# Patient Record
Sex: Male | Born: 1950 | Race: White | Hispanic: No | Marital: Married | Smoking: Former smoker
Health system: Southern US, Community
[De-identification: ages and names within clinical notes are randomized; demographics above are authoritative.]

## PROBLEM LIST (undated history)

## (undated) DIAGNOSIS — M1990 Unspecified osteoarthritis, unspecified site: Secondary | ICD-10-CM

## (undated) DIAGNOSIS — I6529 Occlusion and stenosis of unspecified carotid artery: Secondary | ICD-10-CM

## (undated) DIAGNOSIS — I10 Essential (primary) hypertension: Secondary | ICD-10-CM

## (undated) DIAGNOSIS — K219 Gastro-esophageal reflux disease without esophagitis: Secondary | ICD-10-CM

## (undated) DIAGNOSIS — E785 Hyperlipidemia, unspecified: Secondary | ICD-10-CM

## (undated) DIAGNOSIS — R011 Cardiac murmur, unspecified: Secondary | ICD-10-CM

## (undated) DIAGNOSIS — J45909 Unspecified asthma, uncomplicated: Secondary | ICD-10-CM

## (undated) DIAGNOSIS — T7840XA Allergy, unspecified, initial encounter: Secondary | ICD-10-CM

## (undated) DIAGNOSIS — D369 Benign neoplasm, unspecified site: Secondary | ICD-10-CM

## (undated) HISTORY — DX: Hyperlipidemia, unspecified: E78.5

## (undated) HISTORY — PX: COLONOSCOPY: SHX174

## (undated) HISTORY — DX: Allergy, unspecified, initial encounter: T78.40XA

## (undated) HISTORY — DX: Cardiac murmur, unspecified: R01.1

## (undated) HISTORY — DX: Unspecified osteoarthritis, unspecified site: M19.90

## (undated) HISTORY — DX: Occlusion and stenosis of unspecified carotid artery: I65.29

## (undated) HISTORY — DX: Gastro-esophageal reflux disease without esophagitis: K21.9

## (undated) HISTORY — DX: Essential (primary) hypertension: I10

## (undated) HISTORY — PX: WISDOM TOOTH EXTRACTION: SHX21

## (undated) HISTORY — DX: Benign neoplasm, unspecified site: D36.9

## (undated) HISTORY — PX: POLYPECTOMY: SHX149

## (undated) HISTORY — DX: Unspecified asthma, uncomplicated: J45.909

---

## 1997-12-02 ENCOUNTER — Other Ambulatory Visit: Admission: RE | Admit: 1997-12-02 | Discharge: 1997-12-02 | Payer: Self-pay | Admitting: Gastroenterology

## 1999-07-29 ENCOUNTER — Emergency Department (HOSPITAL_COMMUNITY): Admission: EM | Admit: 1999-07-29 | Discharge: 1999-07-29 | Payer: Self-pay

## 1999-08-07 ENCOUNTER — Encounter: Payer: Self-pay | Admitting: *Deleted

## 1999-08-07 ENCOUNTER — Emergency Department (HOSPITAL_COMMUNITY): Admission: EM | Admit: 1999-08-07 | Discharge: 1999-08-07 | Payer: Self-pay | Admitting: Emergency Medicine

## 1999-08-09 ENCOUNTER — Inpatient Hospital Stay (HOSPITAL_COMMUNITY): Admission: AD | Admit: 1999-08-09 | Discharge: 1999-08-12 | Payer: Self-pay | Admitting: *Deleted

## 2006-01-16 ENCOUNTER — Ambulatory Visit: Payer: Self-pay | Admitting: Family Medicine

## 2006-01-16 LAB — CONVERTED CEMR LAB
BUN: 17 mg/dL (ref 6–23)
CO2: 31 meq/L (ref 19–32)
Calcium: 9.5 mg/dL (ref 8.4–10.5)
Chloride: 98 meq/L (ref 96–112)
Creatinine, Ser: 1 mg/dL (ref 0.4–1.5)
GFR calc non Af Amer: 82 mL/min
Glomerular Filtration Rate, Af Am: 99 mL/min/{1.73_m2}
Glucose, Bld: 113 mg/dL — ABNORMAL HIGH (ref 70–99)
Potassium: 3.8 meq/L (ref 3.5–5.1)
Sodium: 137 meq/L (ref 135–145)

## 2006-06-28 ENCOUNTER — Ambulatory Visit: Payer: Self-pay | Admitting: Family Medicine

## 2006-06-28 LAB — CONVERTED CEMR LAB
BUN: 17 mg/dL (ref 6–23)
CO2: 31 meq/L (ref 19–32)
Calcium: 9.2 mg/dL (ref 8.4–10.5)
Chloride: 106 meq/L (ref 96–112)
Creatinine, Ser: 0.8 mg/dL (ref 0.4–1.5)
GFR calc Af Amer: 129 mL/min
GFR calc non Af Amer: 106 mL/min
Glucose, Bld: 102 mg/dL — ABNORMAL HIGH (ref 70–99)
Potassium: 3.9 meq/L (ref 3.5–5.1)
Sodium: 141 meq/L (ref 135–145)

## 2006-09-25 ENCOUNTER — Telehealth (INDEPENDENT_AMBULATORY_CARE_PROVIDER_SITE_OTHER): Payer: Self-pay | Admitting: *Deleted

## 2006-09-26 ENCOUNTER — Ambulatory Visit: Payer: Self-pay | Admitting: Family Medicine

## 2006-09-26 DIAGNOSIS — I10 Essential (primary) hypertension: Secondary | ICD-10-CM | POA: Insufficient documentation

## 2007-04-10 ENCOUNTER — Telehealth (INDEPENDENT_AMBULATORY_CARE_PROVIDER_SITE_OTHER): Payer: Self-pay | Admitting: *Deleted

## 2007-04-17 ENCOUNTER — Ambulatory Visit: Payer: Self-pay | Admitting: Family Medicine

## 2007-04-18 ENCOUNTER — Telehealth (INDEPENDENT_AMBULATORY_CARE_PROVIDER_SITE_OTHER): Payer: Self-pay | Admitting: *Deleted

## 2007-06-04 ENCOUNTER — Ambulatory Visit: Payer: Self-pay | Admitting: Internal Medicine

## 2007-06-04 DIAGNOSIS — J45909 Unspecified asthma, uncomplicated: Secondary | ICD-10-CM | POA: Insufficient documentation

## 2007-06-04 DIAGNOSIS — K219 Gastro-esophageal reflux disease without esophagitis: Secondary | ICD-10-CM | POA: Insufficient documentation

## 2007-11-26 ENCOUNTER — Ambulatory Visit: Payer: Self-pay | Admitting: Internal Medicine

## 2008-01-20 ENCOUNTER — Telehealth (INDEPENDENT_AMBULATORY_CARE_PROVIDER_SITE_OTHER): Payer: Self-pay | Admitting: *Deleted

## 2008-10-04 ENCOUNTER — Telehealth (INDEPENDENT_AMBULATORY_CARE_PROVIDER_SITE_OTHER): Payer: Self-pay | Admitting: *Deleted

## 2008-11-11 ENCOUNTER — Ambulatory Visit: Payer: Self-pay | Admitting: Family Medicine

## 2009-01-07 ENCOUNTER — Ambulatory Visit: Payer: Self-pay | Admitting: Internal Medicine

## 2009-01-07 DIAGNOSIS — R739 Hyperglycemia, unspecified: Secondary | ICD-10-CM | POA: Insufficient documentation

## 2009-01-07 DIAGNOSIS — E785 Hyperlipidemia, unspecified: Secondary | ICD-10-CM | POA: Insufficient documentation

## 2009-03-21 ENCOUNTER — Ambulatory Visit (HOSPITAL_BASED_OUTPATIENT_CLINIC_OR_DEPARTMENT_OTHER): Admission: RE | Admit: 2009-03-21 | Discharge: 2009-03-21 | Payer: Self-pay | Admitting: Internal Medicine

## 2009-03-21 ENCOUNTER — Ambulatory Visit: Payer: Self-pay | Admitting: Family

## 2009-03-21 ENCOUNTER — Ambulatory Visit: Payer: Self-pay | Admitting: Diagnostic Radiology

## 2009-04-05 ENCOUNTER — Telehealth (INDEPENDENT_AMBULATORY_CARE_PROVIDER_SITE_OTHER): Payer: Self-pay | Admitting: *Deleted

## 2009-10-26 ENCOUNTER — Telehealth (INDEPENDENT_AMBULATORY_CARE_PROVIDER_SITE_OTHER): Payer: Self-pay | Admitting: *Deleted

## 2010-01-04 ENCOUNTER — Telehealth (INDEPENDENT_AMBULATORY_CARE_PROVIDER_SITE_OTHER): Payer: Self-pay | Admitting: *Deleted

## 2010-02-23 ENCOUNTER — Ambulatory Visit: Payer: Self-pay | Admitting: Internal Medicine

## 2010-04-04 NOTE — Assessment & Plan Note (Signed)
Summary: REFILL ON BP MED/CDJ   Vital Signs:  Patient Profile:   59 Years Old Male Weight:      223.13 pounds Pulse rate:   64 / minute Resp:     16 per minute BP sitting:   110 / 74  Vitals Entered By: Kandice Hams (November 26, 2007 3:29 PM)                 Chief Complaint:  folloup bp med refill.  History of Present Illness: No BP pill X 24 hrs.Occa postural hypotension symptoms.Micardis HCT costs $80/month. Previously on Altace w/o issues.No health insurance ,therefore no colonoscopy  Hypertension History:      He denies headache, chest pain, palpitations, dyspnea with exertion, peripheral edema, visual symptoms, neurologic problems, syncope, and side effects from treatment.  Further comments include: BP not monitored @ home .        Positive major cardiovascular risk factors include male age 55 years old or older and hypertension.       Current Allergies: No known allergies   Past Medical History:    Hypertension    Asthma    GERD  Past Surgical History:    Denies surgical history   Family History:    Father: MI @ 62    Mother: arthritis; pulmonary nodules; lupus    Siblings: sister HTN    Review of Systems  Eyes      Denies blurring, double vision, and vision loss-both eyes.  CV      Denies bluish discoloration of lips or nails and leg cramps with exertion.  GI      Denies indigestion.  Neuro      Denies numbness and tingling.   Physical Exam  General:     well-nourished,in no acute distress; alert,appropriate and cooperative throughout examination Lungs:     Normal respiratory effort, chest expands symmetrically. Lungs are clear to auscultation, no crackles or wheezes. Heart:     Normal rate and regular rhythm. S1 and S2 normal without gallop, murmur, click, rub or other extra sounds. Abdomen:     Bowel sounds positive,abdomen soft and non-tender without masses, organomegaly or hernias noted.No AAA, no bruits Pulses:     R and L  carotid,radial,dorsalis pedis and posterior tibial pulses are full and equal bilaterally Extremities:     trace left pedal edema and trace right pedal edema.      Impression & Recommendations:  Problem # 1:  HYPERTENSION (ICD-401.9)  The following medications were removed from the medication list:    Micardis Hct 80-12.5 Mg Tabs (Telmisartan-hctz) .Marland Kitchen... Take one tablet daily  His updated medication list for this problem includes:    Lisinopril 20 Mg Tabs (Lisinopril) .Marland Kitchen... 1 qd    Hydrochlorothiazide 12.5 Mg Caps (Hydrochlorothiazide) .Marland Kitchen... 1 qd   Complete Medication List: 1)  Zantac 150 Maximum Strength 150 Mg Tabs (Ranitidine hcl) .Marland Kitchen.. 1 q 12 hrs 2)  Lisinopril 20 Mg Tabs (Lisinopril) .Marland Kitchen.. 1 qd 3)  Hydrochlorothiazide 12.5 Mg Caps (Hydrochlorothiazide) .Marland Kitchen.. 1 qd  Other Orders: Admin 1st Vaccine (16109) Flu Vaccine 55yrs + (60454)  Hypertension Assessment/Plan:      The patient's hypertensive risk group is category B: At least one risk factor (excluding diabetes) with no target organ damage.  Today's blood pressure is 110/74.     Patient Instructions: 1)  Check your Blood Pressure regularly. If it is above:130/85 on average  you should make an appointment.Consider coverage for colonoscopy.   Prescriptions: HYDROCHLOROTHIAZIDE 12.5 MG  CAPS (HYDROCHLOROTHIAZIDE) 1 qd  #90 x 5   Entered and Authorized by:   Marga Melnick MD   Signed by:   Marga Melnick MD on 11/26/2007   Method used:   Print then Give to Patient   RxID:   [id number] LISINOPRIL 20 MG TABS (LISINOPRIL) 1 qd  #90 x 5   Entered and Authorized by:   Marga Melnick MD   Signed by:   Marga Melnick MD on 11/26/2007   Method used:   Print then Give to Patient   RxID:   [id number]  ]  Flu Vaccine Consent Questions     Do you have a history of severe allergic reactions to this vaccine? no    Any prior history of allergic reactions to egg and/or gelatin? no    Do you have a sensitivity to the  preservative Thimersol? no    Do you have a past history of Guillan-Barre Syndrome? no    Do you currently have an acute febrile illness? no    Have you ever had a severe reaction to latex? no    Vaccine information given and explained to patient? yes    Are you currently pregnant? no    Lot Number:[id number]   Site Given  right Deltoid IM

## 2010-04-04 NOTE — Assessment & Plan Note (Signed)
Summary: SORE THROAT/DRAINAGE/COUGHING/WHEEZING/KDC   Vital Signs:  Patient profile:   60 year old male Weight:      222 pounds Temp:     98.3 degrees F oral Pulse rate:   72 / minute BP sitting:   130 / 82  (left arm)  Vitals Entered By: Jeremy Johann CMA (November 11, 2008 3:49 PM) CC: sore throat, cough, drainage x1week, URI symptoms   History of Present Illness:       This is a 60 year old man who presents with URI symptoms.  The symptoms began 1 week ago.  The patient complains of nasal congestion, purulent nasal discharge, sore throat, and productive cough.  Associated symptoms include wheezing.  The patient denies fever, low-grade fever (<100.5 degrees), fever of 100.5-103 degrees, fever of 103.1-104 degrees, fever to >104 degrees, stiff neck, dyspnea, rash, vomiting, diarrhea, use of an antipyretic, and response to antipyretic.  The patient also reports headache.  The patient denies itchy watery eyes, itchy throat, sneezing, seasonal symptoms, response to antihistamine, muscle aches, and severe fatigue.  The patient denies the following risk factors for Strep sinusitis: unilateral facial pain, unilateral nasal discharge, poor response to decongestant, double sickening, tooth pain, Strep exposure, tender adenopathy, and absence of cough.    Current Medications (verified): 1)  Zantac 150 Maximum Strength 150 Mg  Tabs (Ranitidine Hcl) .Marland Kitchen.. 1 Q 12 Hrs 2)  Lisinopril 20 Mg Tabs (Lisinopril) .Marland Kitchen.. 1 Qd 3)  Hydrochlorothiazide 12.5 Mg Caps (Hydrochlorothiazide) .Marland Kitchen.. 1 Qd 4)  Augmentin 875-125 Mg Tabs (Amoxicillin-Pot Clavulanate) .Marland Kitchen.. 1 By Mouth Two Times A Day  Allergies (verified): No Known Drug Allergies  Past History:  Past medical, surgical, family and social histories (including risk factors) reviewed, and no changes noted (except as noted below).  Past Medical History: Reviewed history from 11/26/2007 and no changes required. Hypertension Asthma GERD  Past Surgical  History: Reviewed history from 11/26/2007 and no changes required. Denies surgical history  Family History: Reviewed history from 11/26/2007 and no changes required. Father: MI @ 31 Mother: arthritis; pulmonary nodules; lupus Siblings: sister HTN  Social History: Reviewed history and no changes required.  Review of Systems      See HPI  Physical Exam  General:  Well-developed,well-nourished,in no acute distress; alert,appropriate and cooperative throughout examination Ears:  External ear exam shows no significant lesions or deformities.  Otoscopic examination reveals clear canals, tympanic membranes are intact bilaterally without bulging, retraction, inflammation or discharge. Hearing is grossly normal bilaterally. Nose:  L frontal sinus tenderness, L maxillary sinus tenderness, R frontal sinus tenderness, and R maxillary sinus tenderness.   Mouth:  Oral mucosa and oropharynx without lesions or exudates.  Teeth in good repair. Neck:  No deformities, masses, or tenderness noted. Lungs:  Normal respiratory effort, chest expands symmetrically. Lungs are clear to auscultation, no crackles or wheezes. Heart:  Normal rate and regular rhythm. S1 and S2 normal without gallop, murmur, click, rub or other extra sounds. Msk:  No deformity or scoliosis noted of thoracic or lumbar spine.   Skin:  Intact without suspicious lesions or rashes Cervical Nodes:  No lymphadenopathy noted Psych:  Cognition and judgment appear intact. Alert and cooperative with normal attention span and concentration. No apparent delusions, illusions, hallucinations   Impression & Recommendations:  Problem # 1:  SINUSITIS - ACUTE-NOS (ICD-461.9)  His updated medication list for this problem includes:    Augmentin 875-125 Mg Tabs (Amoxicillin-pot clavulanate) .Marland Kitchen... 1 by mouth two times a day  Nasonex 50 Mcg/act Susp (Mometasone furoate) .Marland Kitchen... 2 sprays each nostril once daily    Astepro 0.15 % Soln (Azelastine hcl)  .Marland Kitchen... 2 sprays each nostril once daily  Instructed on treatment. Call if symptoms persist or worsen.   Orders: Nebulizer Tx (60454) Rapid Strep (09811)  Problem # 2:  BRONCHITIS- ACUTE (ICD-466.0)  His updated medication list for this problem includes:    Augmentin 875-125 Mg Tabs (Amoxicillin-pot clavulanate) .Marland Kitchen... 1 by mouth two times a day  Take antibiotics and other medications as directed. Encouraged to push clear liquids, get enough rest, and take acetaminophen as needed. To be seen in 5-7 days if no improvement, sooner if worse.  Orders: Nebulizer Tx (91478) Rapid Strep (29562)  Complete Medication List: 1)  Zantac 150 Maximum Strength 150 Mg Tabs (Ranitidine hcl) .Marland Kitchen.. 1 q 12 hrs 2)  Lisinopril 20 Mg Tabs (Lisinopril) .Marland Kitchen.. 1 qd 3)  Hydrochlorothiazide 12.5 Mg Caps (Hydrochlorothiazide) .Marland Kitchen.. 1 qd 4)  Augmentin 875-125 Mg Tabs (Amoxicillin-pot clavulanate) .Marland Kitchen.. 1 by mouth two times a day 5)  Nasonex 50 Mcg/act Susp (Mometasone furoate) .... 2 sprays each nostril once daily 6)  Astepro 0.15 % Soln (Azelastine hcl) .... 2 sprays each nostril once daily Prescriptions: AUGMENTIN 875-125 MG TABS (AMOXICILLIN-POT CLAVULANATE) 1 by mouth two times a day  #20 x 0   Entered and Authorized by:   Loreen Freud DO   Signed by:   Loreen Freud DO on 11/11/2008   Method used:   Electronically to        [facility] [phone]* (retail)       [address]       [place], [place]  [address]       Ph: [phone]       Fax: [phone]   RxID:   [id number]    EKG  Procedure date:  11/11/2008  Findings:      NSR 87 bpm    Medication Administration  Medication # 1:    Medication: Xopenex 1.25mg     Diagnosis: SINUSITIS - ACUTE-NOS (ICD-461.9)    Route: inhaled    Exp Date: 02/02/2009    Lot #: [id number]    Mfr: sepracor    Patient tolerated medication without complications    Given by: Jeremy Johann CMA (November 11, 2008 4:39 PM)  Orders Added: 1)  Est. Patient Level  IV [87564] 2)  Nebulizer Tx [94640] 3)  Rapid Strep [33295]

## 2010-04-04 NOTE — Progress Notes (Signed)
Summary: cough - dr Blossom Hoops   Phone Note Call from Patient Call back at [phone]   Caller: Patient Summary of Call: patient congested,cough causing tightness in chest, temp occasionally x's 3 weeks --- has appt [phone] wants appt sooner    Initial call taken by: Okey Regal [place],  September 25, 2006 2:21 PM  Follow-up for Phone Call        spoke with pt sched ov tomorrow 7/24 9:45 Follow-up by: Kandice Hams,  September 25, 2006 2:46 PM

## 2010-04-04 NOTE — Progress Notes (Signed)
Summary: ? If Pre Meds needed  Phone Note From Other Clinic Call back at [phone]   Caller: Dr.Hartzell's office-[place] Summary of Call: Patient was at his dentist office now and they would like to confirm what patient told them that he does NOT need pre-meds before a teeth cleaning. Patient at one point did but said the last few times he did NOT need pre meds.   Dr.Hopper reviewed patient's chart and agreed that no pre meds required based on last visit with him, no murmur noted./Chrae [facility] CMA  October 26, 2009 3:38 PM

## 2010-04-04 NOTE — Progress Notes (Signed)
Summary: REFILL  Phone Note Refill Request Message from:  Pharmacy on [facility] FAX [phone]  Refills Requested: Medication #1:  ZANTAC 150 MAXIMUM STRENGTH 150 MG  TABS 1 q 12 hrs Initial call taken by: Barb Merino,  October 04, 2008 3:07 PM    Prescriptions: ZANTAC 150 MAXIMUM STRENGTH 150 MG  TABS (RANITIDINE HCL) 1 q 12 hrs  #180 x 1   Entered by:   Kandice Hams   Authorized by:   Marga Melnick MD   Signed by:   Kandice Hams on 10/05/2008   Method used:   Faxed to ...       [facility]* (retail)       [address]       [place], [place]  [address]       Ph: [phone]       Fax: [phone]   RxID:   [id number]

## 2010-04-04 NOTE — Assessment & Plan Note (Signed)
Summary: congested cough/cbs    Vital Signs:  Patient Profile:   60 Years Old Male Weight:      223.25 pounds O2 Sat:      98 % Pulse rate:   62 / minute BP sitting:   130 / 90  Vitals Entered By: Kandice Hams (April 17, 2007 12:39 PM)                 Chief Complaint:  c/o cough non productive, chest congestion, and URI symptoms.  History of Present Illness:  URI Symptoms      This is a 60 year old man who presents with URI symptoms.  The symptoms began duration > 3 weeks ago.  Started with sore throat, PND,  and nasal congestion 3 weeks. Last week started with a productive cough that responded to Mucinex. Then cough restarted over the weekend.    Reports some wheezing and  SOB when he gets into a coughing spell.  The patient reports nasal congestion and dry cough, but denies  purulent nasal discharge, earache.  The patient denies fever, chest pain.      Past Medical History:    Reviewed history from 09/26/2006 and no changes required:       Hypertension      Physical Exam  General:     Well-developed,well-nourished,in no acute distress; alert,appropriate and cooperative throughout examination Ears:     External ear exam shows no significant lesions or deformities.  Otoscopic examination reveals clear canals, tympanic membranes are intact bilaterally without bulging, retraction, inflammation or discharge. Hearing is grossly normal bilaterally. Nose:     nasal dischargemucosal pallor.   Mouth:     o/p mildly erythematous Neck:     No deformities, masses, or tenderness noted. Lungs:     Normal respiratory effort, chest expands symmetrically. Lungs are clear to auscultation, no crackles or wheezes. Heart:     Normal rate and regular rhythm. S1 and S2 normal without gallop, murmur, click, rub or other extra sounds. Additional Exam:     EKG: NSr with no ST elevation or depression. No Q waves.  PVC.    Impression & Recommendations:  Problem # 1:   BRONCHITIS-ACUTE (ICD-466.0)  His updated medication list for this problem includes:    Zithromax Z-pak 250 Mg Tabs (Azithromycin) .Marland Kitchen... As directed  Take antibiotics and other medications as directed. Encouraged to push clear liquids, get enough rest, and take acetaminophen as needed. To be seen in 5-7 days if no improvement, sooner if worse.  Orders: T-2 View CXR, Same Day (71020.5TC)   Complete Medication List: 1)  Micardis Hct 80-12.5 Mg Tabs (Telmisartan-hctz) .... Take one tablet daily 2)  Co Q-10 Vitamin E Fish Oil 60-90-25-200 Caps (Dha-epa-coenzyme q10-vitamin e) 3)  Baby Aspirin 81 Mg Chew (Aspirin) 4)  Zithromax Z-pak 250 Mg Tabs (Azithromycin) .... As directed  Other Orders: EKG w/ Interpretation (93000)     Prescriptions: ZITHROMAX Z-PAK 250 MG  TABS (AZITHROMYCIN) as directed  #1 x 0   Entered and Authorized by:   Leanne Chang MD   Signed by:   Leanne Chang MD on 04/17/2007   Method used:   Print then Give to Patient   RxID:   [id number]  ]

## 2010-04-04 NOTE — Assessment & Plan Note (Signed)
Summary: congestion,cough/alr  Medications Added MICARDIS HCT 80-12.5 MG  TABS (TELMISARTAN-HCTZ) take one tablet daily CO Q-10 VITAMIN E FISH OIL 60-90-25-200  CAPS (DHA-EPA-COENZYME Q10-VITAMIN E)  BABY ASPIRIN 81 MG  CHEW (ASPIRIN)  ZITHROMAX Z-PAK 250 MG  TABS (AZITHROMYCIN) as directed        Vital Signs:  Patient Profile:   60 Years Old Male Weight:      221 pounds Temp:     98.0 degrees F oral Pulse rate:   72 / minute Resp:     16 per minute BP sitting:   120 / 80  (right arm)  Pt. in pain?   no  Vitals Entered By: Ardyth Man (September 26, 2006 9:47 AM)                Chief Complaint:  BP check and runny nose, congestion (chest) color yellow., and URI symptoms.  History of Present Illness:  URI Symptoms      This is a 60 year old man who presents with URI symptoms.  The symptoms began duration 2 weeks ago.  The patient reports nasal congestion, sore throat, and productive cough.  Risk factors for Strep sinusitis include tooth pain.  Had a fever for 2 days. Son had similar symptoms.  Mild SOB ,especially with cough. Denies chest pain.    Past Medical History:    Hypertension      Physical Exam  General:     Well-developed,well-nourished,in no acute distress; alert,appropriate and cooperative throughout examination Ears:     External ear exam shows no significant lesions or deformities.  Otoscopic examination reveals clear canals, tympanic membranes are intact bilaterally without bulging, retraction, inflammation or discharge. Hearing is grossly normal bilaterally. Nose:     Swollen turbinates with yellow nasal discharge Mouth:     Oral mucosa and oropharynx without lesions or exudates.  Teeth in good repair. Neck:     No deformities, masses, or tenderness noted. Lungs:     Normal respiratory effort, chest expands symmetrically. Lungs are clear to auscultation, no crackles or wheezes. Heart:     Normal rate and regular rhythm. S1 and S2 normal  without gallop, murmur, click, rub or other extra sounds.    Impression & Recommendations:  Problem # 1:  BRONCHITIS-ACUTE (ICD-466.0)  His updated medication list for this problem includes:    Zithromax Z-pak 250 Mg Tabs (Azithromycin) .Marland Kitchen... As directed  Orders: Radiology other (Radiology Other)  Take antibiotics and other medications as directed. Encouraged to push clear liquids, get enough rest, and take acetaminophen as needed. To be seen in 5-7 days if no improvement, sooner if worse. Samples of Nasonex 2 squirts each nostril daily for 10 days. F/u in 2 weeks   Problem # 2:  HYPERTENSION (ICD-401.9)  His updated medication list for this problem includes:    Micardis Hct 80-12.5 Mg Tabs (Telmisartan-hctz) .Marland Kitchen... Take one tablet daily  BP today: 120/80  Labs Reviewed: Creat: 0.8 (06/28/2006)  Orders: EKG w/ Interpretation (93000)   Medications Added to Medication List This Visit: 1)  Micardis Hct 80-12.5 Mg Tabs (Telmisartan-hctz) .... Take one tablet daily 2)  Co Q-10 Vitamin E Fish Oil 60-90-25-200 Caps (Dha-epa-coenzyme q10-vitamin e) 3)  Baby Aspirin 81 Mg Chew (Aspirin) 4)  Zithromax Z-pak 250 Mg Tabs (Azithromycin) .... As directed   Patient Instructions: 1)  Discussed with patient that for acute bronchitis symptoms of less than 10 days antibiotics are NOT indicated. Recommended acetaminophen [phone] mg every 4-6 hours (no more  than four times a day) and over the counter cough medication(Mucinex). Call if no improvement in 5-7 days, sooner if increasing cough,  or new symptoms.    Prescriptions: ZITHROMAX Z-PAK 250 MG  TABS (AZITHROMYCIN) as directed  #1 x 0   Entered and Authorized by:   Leanne Chang MD   Signed by:   Leanne Chang MD on 09/26/2006   Method used:   Print then Give to Patient   RxID:   [id number]

## 2010-04-04 NOTE — Assessment & Plan Note (Signed)
Summary: whezzing--acuteonly--tl   Vital Signs:  Patient Profile:   60 Years Old Male Weight:      227.8 pounds O2 Sat:      98 % O2 treatment:    Room Air Temp:     97.8 degrees F oral Pulse rate:   98 / minute Pulse rhythm:   regular Resp:     16 per minute BP sitting:   124 / 80  Pt. in pain?   no  Vitals Entered By: Shary Decamp (June 04, 2007 4:04 PM)              Comments patient had bronchitis 1 mo ago sxs never cleared +SOB tight cough wheezing PN drip using zyrtec ..................................................................Marland KitchenShary Decamp  June 04, 2007 4:06 PM      Chief Complaint:  wheezing and Cough.  History of Present Illness: RTI in 2/09; Rx: Zpack . CXray revealed ? bronchitis. No PMH asthma.  Cough; Rx: Zyrtec      This is a 60 year old man who presents with Cough.  The patient reports non-productive cough, wheezing, and exertional dyspnea, but denies productive cough, pleuritic chest pain, shortness of breath, fever, hemoptysis, and malaise.  Associated symtpoms include chronic rhinitis and acid reflux symptoms.  The patient denies the following symptoms: cold/URI symptoms, sore throat, nasal congestion, weight loss, and peripheral edema.  The cough is worse with exercise and activity.  Risk factors include recurrent sinus infections and chemical exposure.  Diagnostic testing to date has included CXR.  Trigger included dust, pet dander. Occa uses cleaning materials.    Updated Prior Medication List: MICARDIS HCT 80-12.5 MG  TABS (TELMISARTAN-HCTZ) take one tablet daily  Current Allergies (reviewed today): No known allergies       Physical Exam  General:     Well-developed,well-nourished,in no acute distress; alert,appropriate and cooperative throughout examination Eyes:     Funduscopic exam benign, without hemorrhages, exudates or papilledema. Vision grossly normal. Ears:     External ear exam shows no significant lesions or  deformities.  Otoscopic examination reveals clear canals, tympanic membranes are intact bilaterally without bulging, retraction, inflammation or discharge. Hearing is grossly normal bilaterally. Nose:     External nasal examination shows no deformity or inflammation. Nasal mucosa are pink and moist without lesions or exudates. Mouth:     Oral mucosa and oropharynx without lesions or exudates.  Teeth in good repair. Mild erythema. Lungs:     Normal respiratory effort, chest expands symmetrically. Lungs are clear to auscultation, no crackles or wheezes. Dry cough Skin:     Intact without suspicious lesions or rashes Damp to touch Cervical Nodes:     No lymphadenopathy noted Axillary Nodes:     No palpable lymphadenopathy    Impression & Recommendations:  Problem # 1:  REACTIVE AIRWAY DISEASE (ICD-493.90)  His updated medication list for this problem includes:    Symbicort 160-4.5 Mcg/act Aero (Budesonide-formoterol fumarate) .Marland Kitchen... 2 puffs q 12    Singulair 10 Mg Tabs (Montelukast sodium) .Marland Kitchen... 1 qd   Problem # 2:  GERD (ICD-530.81)  His updated medication list for this problem includes:    Zantac 150 Maximum Strength 150 Mg Tabs (Ranitidine hcl) .Marland Kitchen... 1 q 12 hrs   Complete Medication List: 1)  Micardis Hct 80-12.5 Mg Tabs (Telmisartan-hctz) .... Take one tablet daily 2)  Zantac 150 Maximum Strength 150 Mg Tabs (Ranitidine hcl) .Marland Kitchen.. 1 q 12 hrs 3)  Symbicort 160-4.5 Mcg/act Aero (Budesonide-formoterol fumarate) .... 2 puffs  q 12 4)  Singulair 10 Mg Tabs (Montelukast sodium) .Marland Kitchen.. 1 qd   Patient Instructions: 1)  Drink as much fluid as you can tolerate for the next few days. 2)  Avoid foods high in acid (tomatoes, citrus juices, spicy foods). Avoid eating within two hours of lying down or before exercising. Do not over eat; try smaller more frequent meals. Elevate head of bed twelve inches when sleeping.    Prescriptions: SINGULAIR 10 MG  TABS (MONTELUKAST SODIUM) 1 qd  #30 x  1   Entered and Authorized by:   Marga Melnick MD   Signed by:   Marga Melnick MD on 06/04/2007   Method used:   Print then Give to Patient   RxID:   [id number] SYMBICORT 160-4.5 MCG/ACT  AERO (BUDESONIDE-FORMOTEROL FUMARATE) 2 puffs q 12  #1 x 1   Entered and Authorized by:   Marga Melnick MD   Signed by:   Marga Melnick MD on 06/04/2007   Method used:   Print then Give to Patient   RxID:   [id number] ZANTAC 150 MAXIMUM STRENGTH 150 MG  TABS (RANITIDINE HCL) 1 q 12 hrs  #60 x 0   Entered and Authorized by:   Marga Melnick MD   Signed by:   Marga Melnick MD on 06/04/2007   Method used:   Print then Give to Patient   RxID:   [id number]  ]

## 2010-04-04 NOTE — Progress Notes (Signed)
Summary: still not better-lmom  Phone Note Call from Patient Call back at [phone]   Caller: Patient Summary of Call: pt was seen on 03-21-09 for sinus infection and was RX z-pak. pt finish z-pak about 10days ago. pt still c/o of dry coughing,wheezing, pain in teeth, bodyaches. pt uses [facility]. pls advise.................Marland KitchenFelecia Deloach CMA  April 05, 2009 12:38 PM    Follow-up for Phone Call        please call patient and let him know that he needs to return for visit. Follow-up by: Lemont Fillers FNP,  April 05, 2009 12:55 PM  Additional Follow-up for Phone Call Additional follow up Details #1::        left message on machine ..........Marland KitchenDoristine Devoid  April 05, 2009 1:33 PM

## 2010-04-04 NOTE — Progress Notes (Signed)
   Phone Note Outgoing Call Call back at [facility] Phone [phone]   Call placed by: Ardyth Man,  April 18, 2007 4:53 PM Call placed to: Patient Summary of Call: Spoke with patient's wife and is aware of cxray and Dr. Laqueta Linden recommendations. ...................................................................Ardyth Man  April 18, 2007 4:53 PM

## 2010-04-04 NOTE — Letter (Signed)
Summary: Handout Printed  Printed Handout:  - *[facility] Patient Instructions 

## 2010-04-04 NOTE — Progress Notes (Signed)
Summary: hop-refill  Phone Note Refill Request   Refills Requested: Medication #1:  ZANTAC 150 MAXIMUM STRENGTH 150 MG  TABS 1 q 12 hrs [facility] [address]--[phone]  Initial call taken by: Freddy Jaksch,  January 20, 2008 9:31 AM  Follow-up for Phone Call        #180,RX1 Follow-up by: Marga Melnick MD,  January 20, 2008 10:02 PM      Prescriptions: ZANTAC 150 MAXIMUM STRENGTH 150 MG  TABS (RANITIDINE HCL) 1 q 12 hrs  #180 x 1   Entered by:   Kandice Hams   Authorized by:   Marga Melnick MD   Signed by:   Kandice Hams on 01/21/2008   Method used:   Faxed to ...       [facility] (retail)       [address]       [place], [place]  [address]       Ph: [phone]       Fax: [phone]   RxID:   [id number]

## 2010-04-04 NOTE — Assessment & Plan Note (Signed)
Summary: sinus infection//fd   Vital Signs:  Patient profile:   60 year old male Weight:      224 pounds BMI:     28.48 O2 Sat:      97 % on Room air Temp:     97.9 degrees F oral Pulse rate:   72 / minute BP sitting:   110 / 70  (left arm)  Vitals Entered By: Doristine Devoid (March 21, 2009 4:07 PM)  O2 Flow:  Room air CC: sinus congestion and cough used OTC meds w/o improvement   Primary Care Provider:  Alfonse Flavors  CC:  sinus congestion and cough used OTC meds w/o improvement.  History of Present Illness: Charles Tate is a 60 year old male who presents today with c/o sinus drainage since Christmas.  Initially was having productive cough, now dry.  Has developed wheezing and feels a little light headed with standing.  Has been using nasonex with some improvement.    Allergies: No Known Drug Allergies  Review of Systems       Denies sinus tenderness or pressure.  Notes that he has had wheezing on and off- he wonderes if the lisinopril may be contributing to his wheezing and cough.    Physical Exam  General:  Well-developed,well-nourished,in no acute distress; alert,appropriate and cooperative throughout examination Head:  Normocephalic and atraumatic without obvious abnormalities. No apparent alopecia or balding. No sinus tenderness Eyes:  PERRLA Ears:  External ear exam shows no significant lesions or deformities.  Otoscopic examination reveals clear canals, tympanic membranes are intact bilaterally without bulging, retraction, inflammation or discharge. Hearing is grossly normal bilaterally. Mouth:  Oral mucosa and oropharynx without lesions or exudates.  Teeth in good repair. Neck:  No deformities, masses, or tenderness noted. Lungs:  Normal respiratory effort, chest expands symmetrically. Lungs are clear to auscultation, no crackles or wheezes. Heart:  Normal rate and regular rhythm. S1 and S2 normal without gallop, murmur, click, rub or other extra sounds.   Impression &  Recommendations:  Problem # 1:  SINUSITIS (ICD-473.9) Assessment New I suspect + sinusitus, patient instructed  to call if you develop fever over 101, increasing sinus pressure, pain with eye movement, increased facial tenderness of swelling, or if you develop visual changes.  The following medications were removed from the medication list:    Amoxicillin 500 Mg Cap (Amoxicillin) .Marland Kitchen... Take 1 capsule by mouth three times a day x 10 days His updated medication list for this problem includes:    Nasonex 50 Mcg/act Susp (Mometasone furoate) .Marland Kitchen... 2 sprays each nostril once daily    Astepro 0.15 % Soln (Azelastine hcl) .Marland Kitchen... 2 sprays each nostril once daily    Zithromax 250 Mg Tabs (Azithromycin) .Marland Kitchen..Marland Kitchen Two tablet by mouth x 1 tonight, then one tablet by mouth daily x 4 more days  Problem # 2:  BRONCHITIS (ICD-490) Called patient with CXR results- will plan to treat with zithromax instead of amoxicillin.  Patient aware of plan and will follow up with Dr. Alwyn Ren in 1 week.  I also suspect an element of RAD- will start dulera.   The following medications were removed from the medication list:    Amoxicillin 500 Mg Cap (Amoxicillin) .Marland Kitchen... Take 1 capsule by mouth three times a day x 10 days His updated medication list for this problem includes:    Dulera 100-5 Mcg/act Aero (Mometasone furo-formoterol fum) ..... One puff twice daily    Zithromax 250 Mg Tabs (Azithromycin) .Marland Kitchen..Marland Kitchen Two tablet by mouth x  1 tonight, then one tablet by mouth daily x 4 more days  Complete Medication List: 1)  Zantac 150 Maximum Strength 150 Mg Tabs (Ranitidine hcl) .Marland Kitchen.. 1 q 12 hrs 2)  Lisinopril 20 Mg Tabs (Lisinopril) .Marland Kitchen.. 1 once daily, appointment due fo additional refills 3)  Hydrochlorothiazide 12.5 Mg Caps (Hydrochlorothiazide) .Marland Kitchen.. 1 once daily, appointment due for additional refills 4)  Nasonex 50 Mcg/act Susp (Mometasone furoate) .... 2 sprays each nostril once daily 5)  Astepro 0.15 % Soln (Azelastine hcl) .... 2  sprays each nostril once daily 6)  Multivitamins Tabs (Multiple vitamin) .Marland Kitchen.. 1 by mouth once daily 7)  Dulera 100-5 Mcg/act Aero (Mometasone furo-formoterol fum) .... One puff twice daily 8)  Zithromax 250 Mg Tabs (Azithromycin) .... Two tablet by mouth x 1 tonight, then one tablet by mouth daily x 4 more days  Other Orders: CXR- 2view (CXR)  Patient Instructions: 1)  Please complete your chest x-ray today. 2)  Call if you develop fever over 101, increasing sinus pressure, pain with eye movement, increased facial tenderness of swelling, or if you develop visual changes. 3)  Follow up with Dr Alwyn Ren in 1-2 weeks Prescriptions: ZITHROMAX 250 MG TABS (AZITHROMYCIN) two tablet by mouth x 1 tonight, then one tablet by mouth daily x 4 more days  #1 pack x 0   Entered and Authorized by:   Lemont Fillers FNP   Signed by:   Lemont Fillers FNP on 03/21/2009   Method used:   Electronically to        [facility] [phone]* (retail)       [address]       [place], [place]  [address]       Ph: [phone]       Fax: [phone]   RxID:   [id number] AMOXICILLIN 500 MG CAP (AMOXICILLIN) Take 1 capsule by mouth three times a day X 10 days  #30 x 0   Entered and Authorized by:   Lemont Fillers FNP   Signed by:   Lemont Fillers FNP on 03/21/2009   Method used:   Print then Give to Patient   RxID:   [id number]

## 2010-04-04 NOTE — Progress Notes (Signed)
Summary: Refill Request  Phone Note Refill Request Message from:  Patient on January 04, 2010 3:24 PM  Refills Requested: Medication #1:  LISINOPRIL 20 MG TABS 1 once daily   Dosage confirmed as above?Dosage Confirmed   Supply Requested: 1 month  Medication #2:  HYDROCHLOROTHIAZIDE 12.5 MG CAPS 1 once daily   Dosage confirmed as above?Dosage Confirmed   Supply Requested: 1 month [facility]  Next Appointment Scheduled: 02/23/10 Initial call taken by: Lavell Islam,  January 04, 2010 3:25 PM    Prescriptions: HYDROCHLOROTHIAZIDE 12.5 MG CAPS (HYDROCHLOROTHIAZIDE) 1 once daily, Appointment DUE for additional refills  #90 x 0   Entered by:   Shonna Chock CMA   Authorized by:   Marga Melnick MD   Signed by:   Shonna Chock CMA on 01/04/2010   Method used:   Electronically to        [facility] (retail)       [address]       [place], [place]  [address]       Ph: [phone]       Fax: [phone]   RxID:   [id number] LISINOPRIL 20 MG TABS (LISINOPRIL) 1 once daily, APPOINTMENT DUE FO ADDITIONAL REFILLS  #90 x 0   Entered by:   Shonna Chock CMA   Authorized by:   Marga Melnick MD   Signed by:   Shonna Chock CMA on 01/04/2010   Method used:   Electronically to        [facility] (retail)       [address]       [place], [place]  [address]       Ph: [phone]       Fax: [phone]   RxID:   [id number]

## 2010-04-04 NOTE — Progress Notes (Signed)
Summary: congestion/left msg to call pt needs ov   Phone Note Call from Patient Call back at [phone]   Caller: Patient Reason for Call: Acute Illness Summary of Call: dr. Blossom Hoops pt is having throat pain, congestion, sinus infection that is going down his chest. pt has been treating this at home with musucinex Initial call taken by: Charolette Child,  April 10, 2007 1:21 PM  Follow-up for Phone Call        left msg to call (pt needs ov and some appt are avail for tomorrow...................................................................Marland KitchenKandice Hams  April 10, 2007 2:30 PM  Follow-up by: Kandice Hams,  April 11, 2007 9:29 AM  Additional Follow-up for Phone Call Additional follow up Details #1::        pt called this am ionformed pt need ov and offered appt today pt said he is  and wont be back until late , I then recommend pt to [facility] pt agreed...................................................................Marland KitchenKandice Hams  April 11, 2007 9:33 AM  Additional Follow-up by: Kandice Hams,  April 11, 2007 9:33 AM

## 2010-04-24 ENCOUNTER — Encounter (INDEPENDENT_AMBULATORY_CARE_PROVIDER_SITE_OTHER): Payer: Self-pay | Admitting: *Deleted

## 2010-05-02 NOTE — Letter (Signed)
Summary: Primary Care Appointment Letter  [facility]  [address] [place], [place] [address]   Phone: [phone]  Fax: [phone]    04/24/2010 MRN: [id number]  Charles Tate [address] [place], [place]  [address]  Dear [name],   Your Primary Care Physician Marga Melnick MD has indicated that:    ___X____it is time to schedule an appointment( Last office visit with Dr.Hopper was 2010, to continue refilling meds, appointment is necessary) .    _______you missed your appointment on______ and need to call and          reschedule.    _______you need to have lab work done.    _______you need to schedule an appointment discuss lab or test results.    _______you need to call to reschedule your appointment that is                       scheduled on _________.     Please call our office as soon as possible. Our phone number is [phone]. Please press option 1. Our office is open 8a-5p, Monday through Friday.     Thank you,     Primary Care Scheduler

## 2010-05-04 ENCOUNTER — Encounter: Payer: Self-pay | Admitting: Internal Medicine

## 2010-05-04 ENCOUNTER — Encounter (INDEPENDENT_AMBULATORY_CARE_PROVIDER_SITE_OTHER): Payer: BC Managed Care – PPO | Admitting: Internal Medicine

## 2010-05-04 ENCOUNTER — Other Ambulatory Visit: Payer: Self-pay | Admitting: Internal Medicine

## 2010-05-04 DIAGNOSIS — N400 Enlarged prostate without lower urinary tract symptoms: Secondary | ICD-10-CM

## 2010-05-04 DIAGNOSIS — R7309 Other abnormal glucose: Secondary | ICD-10-CM

## 2010-05-04 DIAGNOSIS — I10 Essential (primary) hypertension: Secondary | ICD-10-CM

## 2010-05-04 DIAGNOSIS — Z0000 Encounter for general adult medical examination without abnormal findings: Secondary | ICD-10-CM

## 2010-05-04 DIAGNOSIS — Z23 Encounter for immunization: Secondary | ICD-10-CM

## 2010-05-04 DIAGNOSIS — E785 Hyperlipidemia, unspecified: Secondary | ICD-10-CM

## 2010-05-04 DIAGNOSIS — D485 Neoplasm of uncertain behavior of skin: Secondary | ICD-10-CM | POA: Insufficient documentation

## 2010-05-04 DIAGNOSIS — N138 Other obstructive and reflux uropathy: Secondary | ICD-10-CM | POA: Insufficient documentation

## 2010-05-04 DIAGNOSIS — N401 Enlarged prostate with lower urinary tract symptoms: Secondary | ICD-10-CM | POA: Insufficient documentation

## 2010-05-04 DIAGNOSIS — J45909 Unspecified asthma, uncomplicated: Secondary | ICD-10-CM

## 2010-05-04 DIAGNOSIS — K219 Gastro-esophageal reflux disease without esophagitis: Secondary | ICD-10-CM

## 2010-05-04 LAB — HEMOGLOBIN A1C: Hgb A1c MFr Bld: 6 % (ref 4.6–6.5)

## 2010-05-04 LAB — CBC WITH DIFFERENTIAL/PLATELET
Basophils Absolute: 0 10*3/uL (ref 0.0–0.1)
Basophils Relative: 0.6 % (ref 0.0–3.0)
Eosinophils Absolute: 0.2 10*3/uL (ref 0.0–0.7)
Eosinophils Relative: 2.7 % (ref 0.0–5.0)
HCT: 45.3 % (ref 39.0–52.0)
Hemoglobin: 15.6 g/dL (ref 13.0–17.0)
Lymphocytes Relative: 27.4 % (ref 12.0–46.0)
Lymphs Abs: 1.6 10*3/uL (ref 0.7–4.0)
MCHC: 34.4 g/dL (ref 30.0–36.0)
MCV: 89.3 fl (ref 78.0–100.0)
Monocytes Absolute: 0.5 10*3/uL (ref 0.1–1.0)
Monocytes Relative: 8.2 % (ref 3.0–12.0)
Neutro Abs: 3.6 10*3/uL (ref 1.4–7.7)
Neutrophils Relative %: 61.1 % (ref 43.0–77.0)
Platelets: 211 10*3/uL (ref 150.0–400.0)
RBC: 5.07 Mil/uL (ref 4.22–5.81)
RDW: 12.9 % (ref 11.5–14.6)
WBC: 6 10*3/uL (ref 4.5–10.5)

## 2010-05-04 LAB — BASIC METABOLIC PANEL
BUN: 15 mg/dL (ref 6–23)
CO2: 27 mEq/L (ref 19–32)
Calcium: 9 mg/dL (ref 8.4–10.5)
Chloride: 99 mEq/L (ref 96–112)
Creatinine, Ser: 0.9 mg/dL (ref 0.4–1.5)
GFR: 94.87 mL/min (ref >60.00)
Glucose, Bld: 94 mg/dL (ref 70–99)
Potassium: 4 mEq/L (ref 3.5–5.1)
Sodium: 136 mEq/L (ref 135–145)

## 2010-05-04 LAB — HEPATIC FUNCTION PANEL
ALT: 20 U/L (ref 0–53)
AST: 20 U/L (ref 0–37)
Albumin: 4.2 g/dL (ref 3.5–5.2)
Alkaline Phosphatase: 65 U/L (ref 39–117)
Bilirubin, Direct: 0.1 mg/dL (ref 0.0–0.3)
Total Bilirubin: 0.7 mg/dL (ref 0.3–1.2)
Total Protein: 7.1 g/dL (ref 6.0–8.3)

## 2010-05-04 LAB — LIPID PANEL
Cholesterol: 186 mg/dL (ref 0–200)
HDL: 31.4 mg/dL — ABNORMAL LOW (ref >39.00)
LDL Cholesterol: 128 mg/dL — ABNORMAL HIGH (ref 0–99)
Total CHOL/HDL Ratio: 6
Triglycerides: 135 mg/dL (ref 0.0–149.0)
VLDL: 27 mg/dL (ref 0.0–40.0)

## 2010-05-04 LAB — CONVERTED CEMR LAB
Cholesterol, target level: 200 mg/dL
HDL goal, serum: 40 mg/dL
LDL Goal: 130 mg/dL

## 2010-05-04 LAB — TSH: TSH: 1.06 u[IU]/mL (ref 0.35–5.50)

## 2010-05-04 LAB — PSA: PSA: 1.64 ng/mL (ref 0.10–4.00)

## 2010-05-11 NOTE — Assessment & Plan Note (Signed)
Summary: physical and fasting labs///sph--confirmed   Vital Signs:  Patient profile:   60 year old male Height:      74.5 inches Weight:      209.6 pounds BMI:     26.65 Temp:     98.2 degrees F oral Pulse rate:   64 / minute Resp:     14 per minute BP sitting:   128 / 86  (left arm) Cuff size:   large  Vitals Entered By: Shonna Chock CMA (May 04, 2010 8:42 AM)  CC: CPX with fasting labs , General Medical Evaluation, Lipid Management, Heartburn   Primary Care Provider:  Alfonse Flavors  CC:  CPX with fasting labs , General Medical Evaluation, Lipid Management, and Heartburn.  History of Present Illness:    Charles Tate is here for a physical; he lost is mother recently He feels he is adjusting. Hypertension Follow-Up: He  reports lightheadedness with URI , but denies urinary frequency, headaches, edema, and fatigue.  The patient denies the following associated symptoms: chest pain, chest pressure, exercise intolerance, dyspnea, palpitations, and syncope.  Compliance with medications (by patient report) has been near 100%.  The patient reports that dietary compliance has been good.  Adjunctive measures currently used by the patient include salt restriction.    GERD: He  reports  purposeful weight loss of 15#, but denies acid reflux, sour taste in mouth, epigastric pain, and trouble swallowing.  The patient denies the following alarm features: melena, dysphagia, hematemesis, and vomiting.  The patient has found the following treatments to be effective: an H2 blocker, zantac 150 mg twice a day. His mother  had esophageal stricture.    Anticoagulation Management History:      Negative risk factors for bleeding include an age less than 58 years old and no history of CVA/TIA.  The bleeding index is 'low risk'.  Positive CHADS2 values include History of HTN.  Negative CHADS2 values include Age > 28 years old, History of Diabetes, and Prior Stroke/CVA/TIA.    Lipid Management History:      Positive  NCEP/ATP III risk factors include male age 60 years old or older, family history for ischemic heart disease (females less than 98 years old & males less than 103 years old), and hypertension.  Negative NCEP/ATP III risk factors include non-diabetic, non-tobacco-user status, no ASHD (atherosclerotic heart disease), no prior stroke/TIA, no peripheral vascular disease, and no history of aortic aneurysm.      Current Medications (verified): 1)  Zantac 150 Maximum Strength 150 Mg  Tabs (Ranitidine Hcl) .Marland Kitchen.. 1 Q 12 Hrs 2)  Lisinopril 20 Mg Tabs (Lisinopril) .Marland Kitchen.. 1 By Mouth Once Daily 3)  Hydrochlorothiazide 12.5 Mg Caps (Hydrochlorothiazide) .Marland Kitchen.. 1 By Mouth Once Daily 4)  Multivitamins  Tabs (Multiple Vitamin) .Marland Kitchen.. 1 By Mouth Once Daily  Allergies (verified): No Known Drug Allergies  Past History:  Past Medical History: Hypertension Asthma (RAD with RTIs only) GERD Hyperglycemia (FBS 113 in 2007) Hyperlipidemia: TG 247, HDL 30,LDL 111 in 12/2000. Framingham Study LDL goal = < 130. + FH of pemature CAD  Past Surgical History: Denies surgical history (no colonoscopy  to date due to insurance issues ,SOC reviewed)  Family History: Father:sudden cardiac arrest/ MI @ 22 Mother: arthritis, pulmonary nodules, lupus, esophageal stricture Siblings: sister: HTN; PGF :? 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    Physical Exam  General:  well-nourished;alert,appropriate and cooperative throughout examination Head:  Normocephalic and atraumatic without obvious abnormalities. No apparent alopecia  Eyes:  No corneal or  conjunctival inflammation noted.Perrla. Funduscopic exam benign, without hemorrhages, exudates or papilledema.  Ears:  External ear exam shows no significant lesions or deformities.  Otoscopic examination reveals clear canals, tympanic membranes are intact bilaterally without bulging, retraction, inflammation or discharge. Hearing is grossly normal bilaterally. Nose:  External nasal examination shows no deformity or inflammation. Nasal mucosa are pink and moist without lesions or exudates. Mouth:  Oral mucosa and oropharynx without lesions or exudates.  Teeth in good repair. Neck:  No deformities, masses, or tenderness noted. Lungs:  Normal respiratory effort, chest expands symmetrically. Lungs are clear to auscultation, no crackles or wheezes. Heart:  Normal rate and regular rhythm. S1 and S2 normal without gallop, murmur, click, rub . S4 Abdomen:  Bowel sounds positive,abdomen soft and non-tender without masses, organomegaly or hernias noted. Rectal:  No external abnormalities noted. Normal sphincter tone. No rectal masses or tenderness. Genitalia:  Testes bilaterally descended without nodularity, tenderness or masses. No scrotal masses or lesions. No penis lesions or urethral discharge. Prostate:  no nodules, no asymmetry, no induration;  1.5+ enlarged.   Msk:  No deformity or scoliosis noted of thoracic or lumbar spine.   Pulses:  R and L carotid,radial,dorsalis pedis and posterior tibial pulses are full and equal bilaterally Extremities:  No clubbing, cyanosis, edema, or deformity noted with normal full range of motion of all joints.   Neurologic:  alert & oriented X3 and DTRs symmetrical and normal.   Skin:  Multiple nevi some with multiple pigments  Cervical Nodes:  No lymphadenopathy noted Axillary Nodes:  No palpable lymphadenopathy Inguinal Nodes:  No significant adenopathy Psych:  memory intact for recent and remote, normally interactive, and good eye contact.     Impression &  Recommendations:  Problem # 1:  ROUTINE GENERAL MEDICAL EXAM@HEALTH  CARE FACL (ICD-V70.0)  Orders: EKG w/ Interpretation (93000) Venipuncture (18841) TLB-Lipid Panel (80061-LIPID) TLB-BMP (Basic Metabolic Panel-BMET) (80048-METABOL) TLB-CBC Platelet - w/Differential (85025-CBCD) TLB-Hepatic/Liver Function Pnl (80076-HEPATIC) TLB-TSH (Thyroid Stimulating Hormone) (84443-TSH) TLB-A1C / Hgb A1C (Glycohemoglobin) (83036-A1C) TLB-PSA (Prostate Specific Antigen) (84153-PSA)  Problem # 2:  HYPERLIPIDEMIA (ICD-272.4)  Problem # 3:  HYPERGLYCEMIA (ICD-790.29)  Problem # 4:  REACTIVE AIRWAY DISEASE (ICD-493.90) Quiescent The following medications were removed from the medication list:    Dulera 100-5 Mcg/act Aero (Mometasone furo-formoterol fum) ..... One puff twice daily  Problem # 5:  HYPERPLASIA PROSTATE UNS W/O UR OBST & OTH LUTS (ICD-600.90)  Problem # 6:  GERD (ICD-530.81)  His updated medication list for this problem includes:    Zantac 150 Maximum Strength 150 Mg Tabs (Ranitidine hcl) .Marland Kitchen... 1 q 12 hrs  Problem # 7:  HYPERTENSION (ICD-401.9)  His updated medication list for this problem includes:    Lisinopril 20 Mg Tabs (Lisinopril) .Marland Kitchen... 1 by mouth once daily    Hydrochlorothiazide 12.5 Mg Caps (Hydrochlorothiazide) .Marland Kitchen... 1 by mouth once daily  Problem # 8:  NEOPLASM, SKIN, UNCERTAIN BEHAVIOR (ICD-238.2)  Complete Medication List: 1)  Zantac 150 Maximum Strength 150 Mg Tabs (Ranitidine hcl) .Marland Kitchen.. 1 q 12 hrs 2)  Lisinopril 20 Mg Tabs (Lisinopril) .Marland Kitchen.. 1 by mouth once daily 3)  Hydrochlorothiazide 12.5 Mg Caps (Hydrochlorothiazide) .Marland Kitchen.. 1 by mouth once  daily 4)  Multivitamins Tabs (Multiple vitamin) .Marland Kitchen.. 1 by mouth once daily  Other Orders: Tdap => 55yrs IM ([address]109) Admin 1st Vaccine ([address])  Lipid Assessment/Plan:      Based on NCEP/ATP III, the patient's risk factor category is "2 or more risk factors and a calculated 10 year CAD risk of > 20%".  The patient's lipid  goals are as follows: Total cholesterol goal is 200; LDL cholesterol goal is 130; HDL cholesterol goal is 40; Triglyceride goal is 150.    Patient Instructions: 1)  Avoid foods high in acid (tomatoes, citrus juices, spicy foods). Avoid eating within two hours of lying down or before exercising. Do not over eat; try smaller more frequent meals. Elevate head of bed twelve inches when sleeping. 2)  Schedule a colonoscopy & Derm consult as discussed. call if referral needed. Prescriptions: HYDROCHLOROTHIAZIDE 12.5 MG CAPS (HYDROCHLOROTHIAZIDE) 1 by mouth once daily  #90 x 3   Entered and Authorized by:   Marga Melnick MD   Signed by:   Marga Melnick MD on 05/04/2010   Method used:   Electronically to        [facility] (retail)       [address]       [place], [place]  [address]       Ph: [phone]       Fax: [phone]   RxID:   [id number] LISINOPRIL 20 MG TABS (LISINOPRIL) 1 by mouth once daily  #90 x 3   Entered and Authorized by:   Marga Melnick MD   Signed by:   Marga Melnick MD on 05/04/2010   Method used:   Electronically to        [facility] (retail)       [address]       [place], [place]  [address]       Ph: [phone]       Fax: [phone]   RxID:   [id number] ZANTAC 150 MAXIMUM STRENGTH 150 MG  TABS (RANITIDINE HCL) 1 q 12 hrs  #180 x 3   Entered and Authorized by:   Marga Melnick MD   Signed by:   Marga Melnick MD on 05/04/2010   Method used:   Electronically to        [facility] (retail)       [address]       [place], [place]  [address]       Ph: [phone]       Fax: [phone]   RxID:   [id number]    Orders Added: 1)  Tdap => 60yrs IM [90715] 2)  Admin 1st Vaccine [90471] 3)  Est. Patient 40-64 years [99396] 4)  EKG w/ Interpretation [93000] 5)  Venipuncture [36415] 6)  TLB-Lipid Panel [80061-LIPID] 7)   TLB-BMP (Basic Metabolic Panel-BMET) [80048-METABOL] 8)  TLB-CBC Platelet - w/Differential [85025-CBCD] 9)  TLB-Hepatic/Liver Function Pnl [80076-HEPATIC] 10)  TLB-TSH (Thyroid Stimulating Hormone) [84443-TSH] 11)  TLB-A1C / Hgb A1C (Glycohemoglobin) [83036-A1C] 12)  TLB-PSA (Prostate Specific Antigen) [10626-RSW]   Immunizations Administered:  Tetanus Vaccine:    Vaccine Type: Tdap    Site: left deltoid    Mfr: GlaxoSmithKline    Dose: 0.5 ml    Route: IM    Given by: Shonna Chock CMA    Exp. Date: 01/27/2012  Lot #: [id number]    VIS given: 01/21/08 version given May 04, 2010.   Immunizations Administered:  Tetanus Vaccine:    Vaccine Type: Tdap    Site: left deltoid    Mfr: GlaxoSmithKline    Dose: 0.5 ml    Route: IM    Given by: Shonna Chock CMA    Exp. Date: 01/27/2012    Lot #: [id number]    VIS given: 01/21/08 version given May 04, 2010.   Appended Document: physical and fasting labs///sph--confirmed

## 2010-07-21 NOTE — H&P (Signed)
[name]. [name]  Patient:    Charles Tate, Charles Tate                     MRN: [id number] Adm. Date:  [id number] Disc. Date: [id number] Attending:  Armanda Heritage Dictator:   Leonides Cave, P.A. CC:         Madolyn Frieze. Jens Som, M.D. [facility]             Leroy Sea., M.D.                         History and Physical  DIAGNOSES: 1. Left flank pain, uncertain etiology.  Patient with negative renal    ultrasound on August 07, 1999 (probable musculoskeletal in nature). 2. Recently diagnosed labile hypertension. 3. Recent sinus infection currently on Guaifenesin and amoxicillin. 4. Rheumatic fever as an adolescent.  BRIEF HISTORY:  Patient is a very pleasant 60 year old white male who is seen in the office on Aug 02, 1999 by Dian Queen and Dr. Olga Millers.  Patient was seen with labile hypertension.  He was also recently diagnosed with hypercholesterolemia with an LDL of 159.  From the office a renal duplex was ordered, though not done.  He also just finished collecting 24-hour for metanephrines and this was turned in at the [facility] today.  He was also to be set up for a GXT Cardiolite in the near future.  He was placed on Altace 2.5 b.i.d. for blood pressure control in the office on the thirtieth.  Patient in the emergency department today for lower right flank pain.  Since his office visit "swishing" in the neck and his headaches have completely resolved.  However, on the evening before coming to the emergency department he was having significant right flank pain described as dullness.  He has completely denied chest pain and shortness of breath all together.  He also denied all urinary complaints.  He has taken high dose Advil with mild to moderate relief but he has had persistent pain all night and is currently in significant pain in his lower right flank.  In the emergency department a renal and aortic ultrasound was ordered and this was negative  for hydronephrosis or obstruction.  A urinalysis in the emergency department was also negative.  Dr. Veneda Melter saw the patient and felt that this was in no way cardiac.  He felt that the pain was musculoskeletal.  Patient was discharged from the emergency department and was told to take Tylenol p.r.n. and to rest.  His Altace was also increased to 5 mg b.i.d.  He will also follow up with physician assistant in the [facility] later on this week. He will call tomorrow to make this appointment.  He was also told to return to the emergency room for worsening symptoms.  He was also given a prescription for Ultram 50 mg q.8h. p.r.n. for further pain. DD:  08/07/99 TD:  08/07/99 Job: [id number]

## 2010-07-21 NOTE — H&P (Signed)
[place]. [facility]  Patient:    Charles Tate, Charles Tate                     MRN: [id number] Adm. Date:  [id number] Disc. Date: [id number] Attending:  Osvaldo Human                         History and Physical  DATE OF BIRTH:  [birth year]  CHIEF COMPLAINT:  Left flank pain.  HISTORY OF PRESENT ILLNESS:  The patient is a 60 year old white male with the chief complaint of left flank pain and malaise that has worsened over the past 24 hours.  The patient is unable to keep down food.  The patients vomitus is nonbloody, nonbilious.  The patient with intermittent fever and chills.  The patient denies diarrhea, melena, bleed or headache.  Two weeks ago, the patient presented to Dr. Laurita Quint office with severe headache and elevated blood pressure.  This followed a chipped tooth.  The patient was sent to be hospitalized, given the severity of hypertension and headache.  CT of the head at that time was negative.  Since that time, the patient has continued to feel poorly, although his headache has resolved and his blood pressure has responded to Altace 5 mg p.o. q.d.  The patient denies dysuria, frequency or hematuria.  PHYSICAL EXAMINATION:  GENERAL:  The patient I sill appearing in mild apparent distress.  HEENT:  Pupils are equal, round and reactive to light.  Funduscopic exam is within normal limits.  Extraocular movements intact.  Mucous membranes are moist.  The oropharynx is without lesions.  NECK:  Supple.  No lymphadenopathy.  No carotid bruits.  VITAL SIGNS:  Regular rhythm, rate 106, blood pressure 135/70, temperature 102, respiratory rate 12 and unlabored.  LUNGS:  Clear to auscultation.  Good symmetric air movement.  ABDOMEN:  Soft.  No HSM.  No guarding.  Normal bowel sounds.  Negative appendiceal maneuvers.  Negative Murphy sign.  Left CVA tenderness, exquisite. No right CVA tenderness.  No suprapubic tenderness.  EXTREMITIES:  No clubbing,  cyanosis, or edema.  NEUROLOGIC:  Cranial nerves II-XII grossly WNL.  Muscle strength 5/5 and symmetric.  Deep tendon reflexes 2 and symmetric.  RECTAL:  Unrevealing.  LABORATORY DATA:  Hyaline casts and granular casts on urine evaluation. Moderate blood on UA.  WBC ______ , 85% neutrophils.  CT of the kidneys is pending.  Ultrasound of the kidneys on June 4 was unrevealing.  PAST MEDICAL HISTORY:  None.  PAST SURGICAL HISTORY:  None.  SOCIAL HISTORY:  No tobacco.  No ethanol.  MEDICATIONS:  Altace 5.  ALLERGIES:  None.  FAMILY HISTORY:  His father died at 23 from acute myocardial infarction.  REVIEW OF SYSTEMS:  No skin breakdown.  No rash.  See HPI.  ASSESSMENT AND PLAN: 1. Pyelonephritis - Ciprofloxin 400 IV b.i.d. 2. Hypovolemia - D5 normal saline. 3. Hypertension - Will hold Altace and begin Toprol XL 50 p.o. q.d. DD:  08/09/99 TD:  08/10/99 Job: [id number]

## 2010-07-21 NOTE — Discharge Summary (Signed)
[place]. [facility]  Patient:    Charles Tate, Charles Tate                     MRN: [id number] Adm. Date:  [id number] Disc. Date: [id number] Attending:  Feliciana Rossetti CC:         Lacretia Leigh. [place]                           Discharge Summary  REASON FOR HOSPITALIZATION:  Hypovolemia.  DISCHARGE DIAGNOSES: 1. Pyelonephritis. 2. Hypovolemia. 3. History of hypertension. 4. Hypopotassemia.  HOSPITAL COURSE:  The patient was admitted to a general medical bed with intravenous antibiotic therapy begun.  A CT scan was performed to rule out stone given significant hematuria.  The patient had been worked up by two outside physicians with no improvement in condition.  The patient responded well to antibiotic therapy.  He did require antihypertensive therapy during his hospital stay.  The patient also required potassium replacement.  CONDITION ON DISCHARGE:  The patient is well-appearing, ambulating with ease, stating that he feels much improved although not totally well.  DISCHARGE MEDICATIONS:  Ciprofloxacin 500 mg b.i.d. x 7 days.  FOLLOWUP:  Follow up with Dr. Quintella Reichert two weeks following discharge. DD:  09/17/99 TD:  09/17/99 Job: [id number]

## 2010-09-28 ENCOUNTER — Encounter: Payer: Self-pay | Admitting: Internal Medicine

## 2010-09-28 ENCOUNTER — Ambulatory Visit (INDEPENDENT_AMBULATORY_CARE_PROVIDER_SITE_OTHER): Payer: BC Managed Care – PPO | Admitting: Internal Medicine

## 2010-09-28 VITALS — BP 130/80 | HR 82 | Temp 98.6°F | Wt 210.0 lb

## 2010-09-28 DIAGNOSIS — J069 Acute upper respiratory infection, unspecified: Secondary | ICD-10-CM

## 2010-09-28 DIAGNOSIS — J029 Acute pharyngitis, unspecified: Secondary | ICD-10-CM

## 2010-09-28 DIAGNOSIS — R05 Cough: Secondary | ICD-10-CM

## 2010-09-28 DIAGNOSIS — R059 Cough, unspecified: Secondary | ICD-10-CM

## 2010-09-28 DIAGNOSIS — J45909 Unspecified asthma, uncomplicated: Secondary | ICD-10-CM

## 2010-09-28 MED ORDER — FLUTICASONE-SALMETEROL 100-50 MCG/DOSE IN AEPB: 1.0000 | INHALATION_SPRAY | Freq: Two times a day (BID) | RESPIRATORY_TRACT | Status: DC

## 2010-09-28 MED ORDER — AZITHROMYCIN 250 MG PO TABS: ORAL_TABLET | ORAL | Status: AC

## 2010-09-28 NOTE — Progress Notes (Signed)
  Subjective:    Patient ID: Charles Tate, male    DOB: [birth year], 60 y.o.   MRN: [id number]  HPI Cough Onset:7/24 but  clear PNDrainage, ST on  7/22 Extrinsic symptoms:itchy eyes, sneezing:no  Infectious symptoms :fever, purulent secretions :? Low grade fever last night; no secretions Chest symptoms: pain, sputum production, hemoptysis,dyspnea:no ;wheezing:yes GI symptoms: Dyspepsia, reflux:no Occupational/environmental exposures:cigar smoke 7/20 Smoking:never ACE inhibitor:yes Treatment/efficacy:Tylenol, Hall's,Robitussin Cold with benefit Past medical history/family history pulmonary disease: Mother had pulmonary nodules; PMH of post infectious RAD    Review of Systems some intermittent dental pain     Objective:   Physical Exam General appearance is of good health and nourishment; no acute distress or increased work of breathing is present.  No  lymphadenopathy about the head, neck, or axilla noted.   Eyes: No conjunctival inflammation or lid edema is present. There is no scleral icterus.  Ears:  External ear exam shows no significant lesions or deformities.  Otoscopic examination reveals clear canals, tympanic membranes are intact bilaterally without bulging, retraction, inflammation or discharge.  Nose:  External nasal examination shows no deformity or inflammation. Nasal mucosa are pink and moist without lesions or exudates. No septal dislocation or dislocation.No obstruction to airflow.   Oral exam: Dental hygiene is good; lips and gums are healthy appearing.There is no oropharyngeal erythema or exudate noted.   Neck:  No deformities, thyromegaly, masses, or tenderness noted.    Heart:  Normal rate and regular rhythm. S1 and S2 normal without gallop, murmur, click, rub .S4  Lungs:Chest clear to auscultation; no wheezes, rhonchi,rales ,or rubs present.No increased work of breathing.    Extremities:  No cyanosis, edema, or clubbing  noted    Skin: Warm & dry w/o  jaundice or tenting. Slightly diaphoretic          Assessment & Plan:  #1 upper respiratory infection, acute. Symptoms are sore throat , head congestion and nonproductive cough. Note: on ACE-I also  #2 probable postinfectious reactive airway component  Plan: See orders

## 2010-09-28 NOTE — Patient Instructions (Signed)
Plain Mucinex for thick secretions ;force NON dairy fluids for next 48 hrs. Use a Neti pot daily as needed for sinus congestion  Use the sample inhaler as directed

## 2010-12-08 ENCOUNTER — Encounter: Payer: Self-pay | Admitting: Internal Medicine

## 2010-12-08 ENCOUNTER — Ambulatory Visit (INDEPENDENT_AMBULATORY_CARE_PROVIDER_SITE_OTHER): Payer: Self-pay | Admitting: Internal Medicine

## 2010-12-08 VITALS — BP 126/88 | HR 71 | Temp 98.1°F | Wt 212.0 lb

## 2010-12-08 DIAGNOSIS — S239XXA Sprain of unspecified parts of thorax, initial encounter: Secondary | ICD-10-CM

## 2010-12-08 DIAGNOSIS — IMO0002 Reserved for concepts with insufficient information to code with codable children: Secondary | ICD-10-CM

## 2010-12-08 DIAGNOSIS — M549 Dorsalgia, unspecified: Secondary | ICD-10-CM

## 2010-12-08 LAB — POCT URINALYSIS DIPSTICK
Bilirubin, UA: NEGATIVE
Blood, UA: NEGATIVE
Glucose, UA: NEGATIVE
Ketones, UA: NEGATIVE
Leukocytes, UA: NEGATIVE
Nitrite, UA: NEGATIVE
Protein, UA: NEGATIVE
Spec Grav, UA: 1.015
Urobilinogen, UA: 0.2
pH, UA: 6

## 2010-12-08 MED ORDER — TRAMADOL HCL 50 MG PO TABS: 50.0000 mg | ORAL_TABLET | Freq: Four times a day (QID) | ORAL | Status: DC | PRN

## 2010-12-08 MED ORDER — CYCLOBENZAPRINE HCL 5 MG PO TABS: 5.0000 mg | ORAL_TABLET | ORAL | Status: DC

## 2010-12-08 NOTE — Patient Instructions (Signed)
The best exercises for the low back include freestyle swimming, stretch aerobics, and yoga. 
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
## 2010-12-13 ENCOUNTER — Telehealth: Payer: Self-pay | Admitting: *Deleted

## 2010-12-13 NOTE — Telephone Encounter (Signed)
Left message to call office

## 2010-12-13 NOTE — Telephone Encounter (Signed)
Pt left VM that pain is more severe and feels a little funny. Pt wonder if it is not some type of infection ( prostate or kidney ). Pt notes that pain is occuring more frequently.   Left message to call office to get further detail of symptoms.

## 2010-12-13 NOTE — Telephone Encounter (Signed)
The back pain began after an injury, lifting  lugguage, and prolonged driving IUrinalysis was normal. The urinalysis can be repeated and cultured but  musculoskeletal pain is suggested by history & exam . This would best  be treated by physical therapy or chiropractor.

## 2010-12-18 NOTE — Telephone Encounter (Signed)
Discuss with patient, who states that pain has improved just comes and goes now. Pt advise if symptoms does not continue to improve to give [place] a call and we would be glad to get him referred. Pt ok

## 2011-03-03 ENCOUNTER — Other Ambulatory Visit: Payer: Self-pay | Admitting: Internal Medicine

## 2011-03-06 HISTORY — PX: OTHER SURGICAL HISTORY: SHX169

## 2011-04-28 ENCOUNTER — Other Ambulatory Visit: Payer: Self-pay | Admitting: Internal Medicine

## 2011-06-19 ENCOUNTER — Other Ambulatory Visit: Payer: Self-pay | Admitting: Internal Medicine

## 2011-09-20 ENCOUNTER — Encounter: Payer: Self-pay | Admitting: Internal Medicine

## 2011-09-20 ENCOUNTER — Ambulatory Visit (INDEPENDENT_AMBULATORY_CARE_PROVIDER_SITE_OTHER): Payer: BC Managed Care – PPO | Admitting: Internal Medicine

## 2011-09-20 VITALS — BP 114/60 | HR 61 | Temp 98.2°F | Wt 209.0 lb

## 2011-09-20 DIAGNOSIS — R059 Cough, unspecified: Secondary | ICD-10-CM

## 2011-09-20 DIAGNOSIS — J310 Chronic rhinitis: Secondary | ICD-10-CM

## 2011-09-20 DIAGNOSIS — R05 Cough: Secondary | ICD-10-CM

## 2011-09-20 MED ORDER — FLUTICASONE-SALMETEROL 250-50 MCG/DOSE IN AEPB: 1.0000 | INHALATION_SPRAY | Freq: Two times a day (BID) | RESPIRATORY_TRACT | Status: DC

## 2011-09-20 MED ORDER — FLUTICASONE PROPIONATE 50 MCG/ACT NA SUSP: 1.0000 | Freq: Two times a day (BID) | NASAL | Status: DC | PRN

## 2011-09-20 NOTE — Progress Notes (Signed)
  Subjective:    Patient ID: Charles Tate, male    DOB: August 24, 1950, 61 y.o.   MRN: [id number]  HPI 2 weeks ago he noted rhinitis following pressure cleaning his driveway. This was followed by cough with some wheezing and chest tightness. He's also had some lightheadedness. Mucinex caused this him to feel somewhat unbalanced. 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 He has had no itchy/watery eyes or sneezing prior to this event. He has had congestion but denies nasal purulence, frontal headache, facial pain, dental pain, otic pain, or otic discharge. The cough has been "wheezy" and nonproductive. He's had no associated fever, chills, sweats      Objective:   Physical Exam General appearance:good health ;well nourished; no acute distress or increased work of breathing is present.  No  lymphadenopathy about the head, neck, or axilla noted.   Eyes: No conjunctival inflammation or lid edema is present.   Ears:  External ear exam shows no significant lesions or deformities.  Otoscopic examination reveals clear canals, tympanic membranes are intact bilaterally without bulging, retraction, inflammation or discharge.  Nose:  External nasal examination shows no deformity or inflammation. Nasal mucosa are pink and moist without lesions or exudates. R septal  deviation.No obstruction to airflow.   Oral exam: Dental hygiene is good; lips and gums are healthy appearing.There is no oropharyngeal erythema or exudate noted.    Heart:  Normal rate and regular rhythm. S1 and S2 normal without gallop, murmur, click, rub or other extra sounds.   Lungs:Chest clear to auscultation; no wheezes, rhonchi,rales ,or rubs present.No increased work of breathing.    Extremities:  No cyanosis, edema, or clubbing  noted    Skin: Warm & dry           Assessment & Plan:  #1 rhinitis  #2  reactive airways disease manifested as cough and subjective wheezing  Plan: See orders and recommendations

## 2011-09-20 NOTE — Patient Instructions (Addendum)
Plain Mucinex for thick secretions ;force NON dairy fluids . Use a Neti pot daily as needed for sinus congestion; going from open side to congested side . Nasal cleansing in the shower as discussed. Make sure that all residual soap is removed to prevent irritation. Fluticasone 1 spray in each nostril twice a day as needed. Use the "crossover" technique as discussed. Plain Allegra 160 daily as needed for itchy eyes & sneezing.    

## 2011-10-03 ENCOUNTER — Ambulatory Visit (INDEPENDENT_AMBULATORY_CARE_PROVIDER_SITE_OTHER): Payer: BC Managed Care – PPO | Admitting: Internal Medicine

## 2011-10-03 ENCOUNTER — Encounter: Payer: Self-pay | Admitting: Internal Medicine

## 2011-10-03 VITALS — BP 120/78 | HR 61 | Temp 97.8°F | Resp 12 | Ht 74.5 in | Wt 208.0 lb

## 2011-10-03 DIAGNOSIS — Z0000 Encounter for general adult medical examination without abnormal findings: Secondary | ICD-10-CM

## 2011-10-03 LAB — CBC WITH DIFFERENTIAL/PLATELET
Basophils Absolute: 0 10*3/uL (ref 0.0–0.1)
Basophils Relative: 0.7 % (ref 0.0–3.0)
Eosinophils Absolute: 0.1 10*3/uL (ref 0.0–0.7)
Eosinophils Relative: 1.9 % (ref 0.0–5.0)
HCT: 45.8 % (ref 39.0–52.0)
Hemoglobin: 15.5 g/dL (ref 13.0–17.0)
Lymphocytes Relative: 31.5 % (ref 12.0–46.0)
Lymphs Abs: 1.5 10*3/uL (ref 0.7–4.0)
MCHC: 33.8 g/dL (ref 30.0–36.0)
MCV: 89.1 fl (ref 78.0–100.0)
Monocytes Absolute: 0.4 10*3/uL (ref 0.1–1.0)
Monocytes Relative: 9.2 % (ref 3.0–12.0)
Neutro Abs: 2.7 10*3/uL (ref 1.4–7.7)
Neutrophils Relative %: 56.7 % (ref 43.0–77.0)
Platelets: 209 10*3/uL (ref 150.0–400.0)
RBC: 5.14 Mil/uL (ref 4.22–5.81)
RDW: 12.9 % (ref 11.5–14.6)
WBC: 4.8 10*3/uL (ref 4.5–10.5)

## 2011-10-03 LAB — BASIC METABOLIC PANEL
BUN: 22 mg/dL (ref 6–23)
CO2: 28 mEq/L (ref 19–32)
Calcium: 9.1 mg/dL (ref 8.4–10.5)
Chloride: 102 mEq/L (ref 96–112)
Creatinine, Ser: 0.9 mg/dL (ref 0.4–1.5)
GFR: 88.53 mL/min (ref >60.00)
Glucose, Bld: 100 mg/dL — ABNORMAL HIGH (ref 70–99)
Potassium: 3.7 mEq/L (ref 3.5–5.1)
Sodium: 139 mEq/L (ref 135–145)

## 2011-10-03 LAB — HEPATIC FUNCTION PANEL
ALT: 22 U/L (ref 0–53)
AST: 22 U/L (ref 0–37)
Albumin: 4.3 g/dL (ref 3.5–5.2)
Alkaline Phosphatase: 63 U/L (ref 39–117)
Bilirubin, Direct: 0.1 mg/dL (ref 0.0–0.3)
Total Bilirubin: 0.9 mg/dL (ref 0.3–1.2)
Total Protein: 7.4 g/dL (ref 6.0–8.3)

## 2011-10-03 LAB — LDL CHOLESTEROL, DIRECT: Direct LDL: 149.2 mg/dL

## 2011-10-03 LAB — LIPID PANEL
Cholesterol: 209 mg/dL — ABNORMAL HIGH (ref 0–200)
HDL: 36.9 mg/dL — ABNORMAL LOW (ref >39.00)
Total CHOL/HDL Ratio: 6
Triglycerides: 154 mg/dL — ABNORMAL HIGH (ref 0.0–149.0)
VLDL: 30.8 mg/dL (ref 0.0–40.0)

## 2011-10-03 LAB — TSH: TSH: 0.99 u[IU]/mL (ref 0.35–5.50)

## 2011-10-03 MED ORDER — LOSARTAN POTASSIUM 100 MG PO TABS: ORAL_TABLET | ORAL | Status: DC

## 2011-10-03 NOTE — Progress Notes (Signed)
Subjective:    Patient ID: Charles Tate, male    DOB: 1951-01-21, 61 y.o.   MRN: [id number]  HPI  Charles Tate is here for a physical;acute issues include intermittent dry cough in am.      Review of Systems HYPERTENSION: Disease Monitoring: Blood pressure range-not monitored @ home  Chest pain, palpitations- no       Dyspnea- no Medications: Compliance- yes  Lightheadedness,Syncope- no.  Note: The dry cough is in the context of  recent respiratory tract infection with reactive airways symptoms. He is also on ACE inhibitor    Edema- no  FASTING HYPERGLYCEMIA, PMH of:   Polyuria/phagia/dipsia- no     Visual problems- no FH:P aunt  HYPERLIPIDEMIA: Medications: Compliance- no meds Diet: heart healthy Abd pain, bowel changes-no; colonoscopy 8/5   Muscle aches- no  Past medical history/family history/social history were all reviewed and updated.        Objective:   Physical Exam Gen.: Healthy and well-nourished in appearance. Alert, appropriate and cooperative throughout exam. Head: Normocephalic without obvious abnormalities Eyes: No corneal or conjunctival inflammation noted. Pupils equal round reactive to light and accommodation. Fundal exam is benign without hemorrhages, exudate, papilledema. Extraocular motion intact. Vision grossly normal with lenses. Ears: External  ear exam reveals no significant lesions or deformities. Canals clear .TMs normal. Hearing is grossly normal bilaterally. Nose: External nasal exam reveals no deformity or inflammation. Nasal mucosa are pink and moist. No lesions or exudates noted.   Mouth: Oral mucosa and oropharynx reveal no lesions or exudates. Teeth in good repair. Neck: No deformities, masses, or tenderness noted. Range of motion & Thyroid normal Lungs: Normal respiratory effort; chest expands symmetrically. Lungs are clear to auscultation without rales, wheezes, or increased work of breathing. Heart: Normal rate and rhythm. Normal S1  and S2. No gallop, click, or rub. S4 w/o murmur. Abdomen: Bowel sounds normal; abdomen soft and nontender. No masses, organomegaly or hernias noted. Genitalia/ DRE: Genitalia normal except for small left varices . There is minimal asymmetry of the prostate; left lobe is slightly larger than the right. There is no enlargement, induration,or  nodularity Musculoskeletal/extremities: No deformity or scoliosis noted of  the thoracic or lumbar spine. No clubbing, cyanosis, edema, or deformity noted. Range of motion  normal .Tone & strength  normal.Joints normal. Nail health  good. Small ganglion over the ventral left palm below the third finger Vascular: Carotid, radial artery, dorsalis pedis and  posterior tibial pulses are full and equal. No bruits present. Neurologic: Alert and oriented x3. Deep tendon reflexes symmetrical and normal.         Skin: Intact without suspicious lesions or rashes. He has scattered benign nevi and cherry angiomata. Lymph: No cervical, axillary, or inguinal lymphadenopathy present. Psych: Mood and affect are normal. Normally interactive                                                                                         Assessment & Plan:  #1 comprehensive physical exam; no acute findings #2 see Problem List with Assessments & Recommendations  #3 intermittent, dry morning cough. Clinically no evidence of active reactive airways  disease. He denies active reflux symptoms. Cough may be due to the ACE inhibitor. He will be changed to an ARB Plan: see Orders

## 2011-10-03 NOTE — Patient Instructions (Addendum)
Preventive Health Care: Exercise at least 30-45 minutes a day,  3-4 days a week.  Eat a low-fat diet with lots of fruits and vegetables, up to 7-9 servings per day. Consume less than 40 grams of sugar per day from foods & drinks with High Fructose Corn Sugar as # 1,2,3 or # 4 on label. Health Care Power of Attorney & Living Will. Complete if not in place ; these place you in charge of your health care decisions. Blood Pressure Goal  Ideally is an AVERAGE < 135/85. This AVERAGE should be calculated from @ least 5-7 BP readings taken @ different times of day on different days of week. You should not respond to isolated BP readings , but rather the AVERAGE for that week . Please try to go on My Chart within the next 24 hours to allow me to release the results directly to you.  

## 2011-10-04 LAB — HM COLONOSCOPY

## 2011-10-30 ENCOUNTER — Telehealth: Payer: Self-pay

## 2011-10-30 MED ORDER — LOSARTAN POTASSIUM-HCTZ 100-12.5 MG PO TABS: 1.0000 | ORAL_TABLET | Freq: Every day | ORAL | Status: DC

## 2011-10-30 NOTE — Telephone Encounter (Signed)
Left message on voicemail informing patient rx sent in for #30 as a trial

## 2011-10-30 NOTE — Telephone Encounter (Signed)
LosartanHCT 100/12.5  # 30 as trial

## 2011-10-30 NOTE — Telephone Encounter (Signed)
Patient called to question B/P medications. Patient was taking Lisinopril and was told to switch to Losartan when current supply of Lisinopril out. Patient took last pill yesterday, patient was told by the pharmacy that he could take a combo pill since he is already on HCTZ and will begin Losartan. Although rx for Losartan already sent to [facility], patient would like to know if Dr.Hopper would like to consider placing him on a combo pill of Losartan and HCTZ, Dr.Hopper please advise

## 2011-11-07 ENCOUNTER — Telehealth: Payer: Self-pay | Admitting: Internal Medicine

## 2011-11-07 NOTE — Telephone Encounter (Signed)
[place] discussed this with me right after it happened. I called patient and left message for him to call back and let me know if he would be willing to come in tomorrow at 7:45am (patient to be double-booked with 8:00 am patient). Patient to call back and let me know

## 2011-11-07 NOTE — Telephone Encounter (Signed)
appt made dbl booked at 8am advised phyllis he would be in early

## 2011-11-07 NOTE — Telephone Encounter (Signed)
Noted  

## 2011-11-07 NOTE — Telephone Encounter (Signed)
Pt walked-in this morning requesting to be seen for his elevated BP. He was sent to my desk and I offered him an appointment with Sandford Craze at our [facility] office because we do not have any available appointments for today. Pt stated that he just wanted his BP checked and did not understand why he needed an appointment.  I advised pt that we could check his blood pressure, but if it was elevated he would need an appointment with a physician to adjust medication/dosage. Patient then stated "Just forget it" and walked out of the building.

## 2011-11-08 ENCOUNTER — Ambulatory Visit (INDEPENDENT_AMBULATORY_CARE_PROVIDER_SITE_OTHER): Payer: BC Managed Care – PPO | Admitting: Internal Medicine

## 2011-11-08 ENCOUNTER — Encounter: Payer: Self-pay | Admitting: Internal Medicine

## 2011-11-08 VITALS — BP 136/88 | HR 80 | Wt 209.6 lb

## 2011-11-08 DIAGNOSIS — I10 Essential (primary) hypertension: Secondary | ICD-10-CM

## 2011-11-08 MED ORDER — TELMISARTAN-HCTZ 80-12.5 MG PO TABS: 1.0000 | ORAL_TABLET | Freq: Every day | ORAL | Status: DC

## 2011-11-08 NOTE — Assessment & Plan Note (Signed)
Because of subjective concerns with the losartan/HCT; he'll be changed to Micardis/HCT 80/12.5. He was given 2 months of samples and a 90 day prescription with a discount coupon. Blood pressure goals were discussed

## 2011-11-08 NOTE — Patient Instructions (Signed)
Blood Pressure Goal  Ideally is an AVERAGE < 135/85. This AVERAGE should be calculated from @ least 5-7 BP readings taken @ different times of day on different days of week. You should not respond to isolated BP readings , but rather the AVERAGE for that week  

## 2011-11-08 NOTE — Progress Notes (Signed)
  Subjective:    Patient ID: Charles Tate, male    DOB: 10-Feb-1951, 61 y.o.   MRN: [id number]  HPI He was changed from an ACE inhibitor/HCTZ because of some cough and wheezing to losartan/HCT 100/12.5. The airway symptoms have resolved. He has a sensation of lightheadedness since this change in medicines with slight headache. He also questions a slight tachycardia intermittently.  Blood pressures have ranged from the low 130s over the high 70s to145 over the mid 80s over the last week. He has not averaged his BP.  He was previously on Micardis  with excellent blood pressure control. This was changed because of lack of coverage by insurance company.  He denies chest pain, palpitations, shortness of breath, claudication, or edema.    Review of Systems He recently had 2 small polyps removed by Dr. Noe Gens in [place]; repeat is planned in 2 years.     Objective:   Physical Exam He appears healthy and well-nourished; he is in no acute distress  No carotid bruits are present.  Heart rhythm and rate are normal with no significant murmurs or gallops.  Chest is clear with no increased work of breathing  There is no evidence of aortic aneurysm or renal artery bruits  He has no clubbing or edema.   Pedal pulses are intact   No ischemic skin changes are present         Assessment & Plan:

## 2011-12-09 ENCOUNTER — Other Ambulatory Visit: Payer: Self-pay | Admitting: Internal Medicine

## 2011-12-11 NOTE — Telephone Encounter (Signed)
Refill done.  

## 2012-01-04 ENCOUNTER — Telehealth: Payer: Self-pay | Admitting: Internal Medicine

## 2012-01-04 NOTE — Telephone Encounter (Signed)
Pt states the Micardis is still over $100/month with the coupons we gave him. He would like to either get samples until he gets insurance in January or have something less expensive prescribed. Call mobile #

## 2012-01-04 NOTE — Telephone Encounter (Signed)
Spoke with patient, samples placed at the front for pick-up

## 2012-01-22 ENCOUNTER — Other Ambulatory Visit: Payer: Self-pay | Admitting: Internal Medicine

## 2012-01-23 NOTE — Telephone Encounter (Signed)
Left message on voicemail for patient to return call when available. Reason for call (not left on voicemail) -? Refill request, requested medication is not on med list

## 2012-01-24 NOTE — Telephone Encounter (Signed)
Left message on voicemail for patient to return call when available   

## 2012-01-24 NOTE — Telephone Encounter (Signed)
I spoke with patient, patient is currently taking combo pill Micardis and not HCTZ by itself, patient states this must be a auto refill

## 2012-03-17 ENCOUNTER — Telehealth: Payer: Self-pay | Admitting: Internal Medicine

## 2012-03-17 MED ORDER — LOSARTAN POTASSIUM-HCTZ 100-12.5 MG PO TABS: 1.0000 | ORAL_TABLET | Freq: Every day | ORAL | Status: DC

## 2012-03-17 NOTE — Telephone Encounter (Signed)
Left message on voicemail for patient to return call when available   

## 2012-03-17 NOTE — Telephone Encounter (Signed)
Losartan HCT 100/12.5 ( # 90) should be cheaper ; but he should verify with his plan

## 2012-03-17 NOTE — Telephone Encounter (Signed)
RX sent

## 2012-03-17 NOTE — Telephone Encounter (Signed)
Patient would like Losartan sent to [facility] on [address].

## 2012-03-17 NOTE — Telephone Encounter (Signed)
pt called regarding Micardis HCT--went to get filled saturday as he ran out -- with new insurance this medication is not covered cb# [phone] Pt has not had in 3-days would like something cheaper  Pt stated he will contact insurance company to see what he can do on his end but at this time he can not afford $180.00

## 2012-04-19 ENCOUNTER — Other Ambulatory Visit: Payer: Self-pay

## 2012-04-21 ENCOUNTER — Other Ambulatory Visit: Payer: Self-pay | Admitting: Internal Medicine

## 2012-04-22 NOTE — Telephone Encounter (Signed)
Hopp please advise for Hyzarr is on medication list, patient states insurance will not cover in phone note dated 03/17/12. Did you change to plain HCTZ 12.5 mg (this is what is being requested from the insurance company)

## 2012-04-22 NOTE — Telephone Encounter (Signed)
Change to losartan 100 mg plus Microzide (HCTZ) 12.5 mg daily dispense 90 of each

## 2012-05-13 ENCOUNTER — Other Ambulatory Visit: Payer: Self-pay | Admitting: Internal Medicine

## 2012-06-04 ENCOUNTER — Encounter: Payer: Self-pay | Admitting: Internal Medicine

## 2012-06-04 ENCOUNTER — Ambulatory Visit (INDEPENDENT_AMBULATORY_CARE_PROVIDER_SITE_OTHER): Payer: BC Managed Care – PPO | Admitting: Internal Medicine

## 2012-06-04 VITALS — BP 112/84 | HR 68 | Temp 97.9°F | Wt 208.0 lb

## 2012-06-04 DIAGNOSIS — B349 Viral infection, unspecified: Secondary | ICD-10-CM

## 2012-06-04 DIAGNOSIS — B9789 Other viral agents as the cause of diseases classified elsewhere: Secondary | ICD-10-CM

## 2012-06-04 MED ORDER — FLUTICASONE PROPIONATE 50 MCG/ACT NA SUSP: 2.0000 | Freq: Every day | NASAL | Status: DC

## 2012-06-04 NOTE — Patient Instructions (Signed)
Rest, fluids , tylenol or advil as needed  For cough, take Mucinex DM twice a day as needed (or similar medication For congestion use flonase 2 sprays on each side of the nose  once a day until you feel better, prescription sent Claritin 10 mg or zyrtec 10 mg OTC: 1 a day as needed  symbicort 80/4.5 2 puffs twice a day as needed for wheezing, chest congestion Call if no better in few days Call anytime if the symptoms are severe

## 2012-06-04 NOTE — Progress Notes (Signed)
  Subjective:    Patient ID: Charles Tate, male    DOB: [birth year], 62 y.o.   MRN: [id number]  HPI Acute visit  symptoms started 4-5 days ago: Mild postnasal dripping, cough, occasional wheezing and chest tightness. Some sore throat and ear pressure. He is taking a generic Robitussin-DM, has some leftover Advair which helped with wheezing.  also some Advil.  Past Medical History  Diagnosis Date  . Hypertension   . RAD (reactive airway disease)     with RTIs  . GERD (gastroesophageal reflux disease)   . Hyperlipidemia    Past Surgical History  Procedure Laterality Date  . Wisdom tooth extraction        Review of Systems  no fever chills No sick contacts Denies itchy eyes or itchy nose No nausea, vomiting, diarrhea. Had generalized aches 3 days ago, he "slept all day long", aches are gone now.  Wonders if Zyrtec or Claritin are  okay to take.     Objective:   Physical Exam General -- alert, well-developed, NAD.   HEENT -- TMs normal, throat w/o redness, face symmetric and not tender to palpation,nose slt congested Lungs -- normal respiratory effort, no intercostal retractions, no accessory muscle use, and normal breath sounds.   Heart-- normal rate, regular rhythm, no murmur, and no gallop.    Neurologic-- alert & oriented X3 and strength normal in all extremities. Psych-- Cognition and judgment appear intact. Alert and cooperative with normal attention span and concentration.  not anxious appearing and not depressed appearing.      Assessment & Plan:  Viral syndrome, Symptoms consistent with viral syndrome, no evidence of a bacterial infection at this point. He has reactive airway disease, was using advair when necessary successfully, lung exam today normal but will provide samples of a similar medication, Symbicort 80/4.5 to be use if wheezing. See instructions.

## 2012-06-08 ENCOUNTER — Other Ambulatory Visit: Payer: Self-pay | Admitting: Internal Medicine

## 2012-12-07 ENCOUNTER — Other Ambulatory Visit: Payer: Self-pay | Admitting: Internal Medicine

## 2012-12-08 ENCOUNTER — Other Ambulatory Visit: Payer: Self-pay | Admitting: *Deleted

## 2012-12-08 MED ORDER — RANITIDINE HCL 150 MG PO TABS: ORAL_TABLET | ORAL | Status: DC

## 2012-12-08 NOTE — Telephone Encounter (Signed)
Zantac refill sent to pharmacy 

## 2012-12-12 ENCOUNTER — Other Ambulatory Visit: Payer: Self-pay | Admitting: Internal Medicine

## 2012-12-12 NOTE — Telephone Encounter (Signed)
Losartan-HCTZ refill sent to pharmacy. OV due

## 2013-01-08 ENCOUNTER — Other Ambulatory Visit: Payer: Self-pay

## 2013-02-09 ENCOUNTER — Ambulatory Visit (INDEPENDENT_AMBULATORY_CARE_PROVIDER_SITE_OTHER): Payer: BC Managed Care – PPO | Admitting: Internal Medicine

## 2013-02-09 ENCOUNTER — Encounter: Payer: Self-pay | Admitting: Internal Medicine

## 2013-02-09 VITALS — BP 129/75 | HR 62 | Temp 97.8°F | Ht 74.0 in | Wt 206.8 lb

## 2013-02-09 DIAGNOSIS — E785 Hyperlipidemia, unspecified: Secondary | ICD-10-CM

## 2013-02-09 DIAGNOSIS — Z8249 Family history of ischemic heart disease and other diseases of the circulatory system: Secondary | ICD-10-CM

## 2013-02-09 DIAGNOSIS — Z0000 Encounter for general adult medical examination without abnormal findings: Secondary | ICD-10-CM

## 2013-02-09 NOTE — Progress Notes (Signed)
Pre visit review using our clinic review tool, if applicable. No additional management support is needed unless otherwise documented below in the visit note. 

## 2013-02-09 NOTE — Progress Notes (Signed)
   Subjective:    Patient ID: Charles Tate, male    DOB: 1950/04/05, 62 y.o.   MRN: [id number]  HPI  He is here for a physical;acute issues include recent chest wall injury. He received a chair and leaning to his left. He heard a pop and had instantaneous pain in the left chest. This has been worse with increased physical activity such as singing as well as with cough. Nonsteroidals have been of benefit.     Review of Systems A modified heart healthy diet is followed; exercise encompasses 20-30 minutes 6-7  times per week as walking without symptoms.  Family history is positive for premature coronary disease. Advanced cholesterol testing not done to date. Low dose ASA not taken Specifically denied are  chest pain, palpitations, dyspnea, or claudication.       Objective:   Physical Exam  Gen.: Healthy and well-nourished in appearance. Alert, appropriate and cooperative throughout exam.Appears younger than stated age  Head: Normocephalic without obvious abnormalities;no alopecia  Eyes: No corneal or conjunctival inflammation noted. Pupils equal round reactive to light and accommodation. Extraocular motion intact. Fundal exam is benign without hemorrhages, exudate, papilledema.  Vision grossly normal with lenses Ears: External  ear exam reveals no significant lesions or deformities. Canals clear .TMs normal. Hearing is grossly normal bilaterally. Nose: External nasal exam reveals no deformity or inflammation. Nasal mucosa are pink and moist. No lesions or exudates noted. Mouth: Oral mucosa and oropharynx reveal no lesions or exudates. Teeth in good repair. Neck: No deformities, masses, or tenderness noted. Range of motion & Thyroid normal. Lungs: Normal respiratory effort; chest expands symmetrically. Lungs are clear to auscultation without rales, wheezes, or increased work of breathing. Heart: Normal rate and rhythm. Normal S1 and S2. No gallop, click, or rub. S4 w/o murmur. Abdomen:  Bowel sounds normal; abdomen soft and nontender. No masses, organomegaly or hernias noted. Genitalia: Genitalia normal except for left varices. Prostate is upper limits of normal without enlargement, asymmetry, nodularity, or induration                                  Musculoskeletal/extremities: No deformity or scoliosis noted of  the thoracic or lumbar spine.  No clubbing, cyanosis, edema, or significant extremity  deformity noted. Range of motion normal .Tone & strength normal. Hand joints normal . Fingernail  health good. Able to lie down & sit up w/o help. Negative SLR bilaterally Vascular: Carotid, radial artery, dorsalis pedis and  posterior tibial pulses are full and equal. No bruits present. Neurologic: Alert and oriented x3. Deep tendon reflexes symmetrical and normal.        Skin: Intact without suspicious lesions or rashes. Lymph: No cervical, axillary, or inguinal lymphadenopathy present. Psych: Mood and affect are normal. Normally interactive                                                                                        Assessment & Plan:  #1 comprehensive physical exam; no acute findings  Plan: see Orders  & Recommendations

## 2013-02-09 NOTE — Patient Instructions (Signed)
Use an anti-inflammatory cream such as Aspercreme or Zostrix cream twice a day to the affected area as needed. In lieu of this warm moist compresses or  hot water bottle can be used. Do not apply ice .Consider glucosamine sulfate 1500 mg daily for joint symptoms. Take this daily  for 4-6 weeks. This will rehydrate the cartilages. Your next office appointment will be determined based upon review of your pending labs . Those instructions will be transmitted to you through My Chart . Reflux of gastric acid may be asymptomatic as this may occur mainly during sleep.The triggers for reflux  include stress; the "aspirin family" ; alcohol; peppermint; and caffeine (coffee, tea, cola, and chocolate). The aspirin family would include aspirin and the nonsteroidal agents such as ibuprofen &  Naproxen. Tylenol would not cause reflux. If having symptoms ; food & drink should be avoided for @ least 2 hours before going to bed.

## 2013-02-10 ENCOUNTER — Other Ambulatory Visit: Payer: Self-pay | Admitting: Internal Medicine

## 2013-02-10 ENCOUNTER — Other Ambulatory Visit (INDEPENDENT_AMBULATORY_CARE_PROVIDER_SITE_OTHER): Payer: BC Managed Care – PPO

## 2013-02-10 DIAGNOSIS — Z0000 Encounter for general adult medical examination without abnormal findings: Secondary | ICD-10-CM

## 2013-02-10 LAB — CBC WITH DIFFERENTIAL/PLATELET
Basophils Absolute: 0 10*3/uL (ref 0.0–0.1)
Basophils Relative: 0.9 % (ref 0.0–3.0)
Eosinophils Absolute: 0.1 10*3/uL (ref 0.0–0.7)
Eosinophils Relative: 2.3 % (ref 0.0–5.0)
HCT: 44.9 % (ref 39.0–52.0)
Hemoglobin: 14.9 g/dL (ref 13.0–17.0)
Lymphocytes Relative: 30.4 % (ref 12.0–46.0)
Lymphs Abs: 1.5 10*3/uL (ref 0.7–4.0)
MCHC: 33.2 g/dL (ref 30.0–36.0)
MCV: 88.3 fl (ref 78.0–100.0)
Monocytes Absolute: 0.4 10*3/uL (ref 0.1–1.0)
Monocytes Relative: 7.6 % (ref 3.0–12.0)
Neutro Abs: 2.9 10*3/uL (ref 1.4–7.7)
Neutrophils Relative %: 58.8 % (ref 43.0–77.0)
Platelets: 203 10*3/uL (ref 150.0–400.0)
RBC: 5.09 Mil/uL (ref 4.22–5.81)
RDW: 12.9 % (ref 11.5–14.6)
WBC: 4.9 10*3/uL (ref 4.5–10.5)

## 2013-02-10 LAB — TSH: TSH: 1 u[IU]/mL (ref 0.35–5.50)

## 2013-02-10 LAB — BASIC METABOLIC PANEL
BUN: 18 mg/dL (ref 6–23)
CO2: 23 mEq/L (ref 19–32)
Calcium: 8.7 mg/dL (ref 8.4–10.5)
Chloride: 101 mEq/L (ref 96–112)
Creatinine, Ser: 0.9 mg/dL (ref 0.4–1.5)
GFR: 94.01 mL/min (ref >60.00)
Glucose, Bld: 106 mg/dL — ABNORMAL HIGH (ref 70–99)
Potassium: 3.9 mEq/L (ref 3.5–5.1)
Sodium: 134 mEq/L — ABNORMAL LOW (ref 135–145)

## 2013-02-10 LAB — HEPATIC FUNCTION PANEL
ALT: 16 U/L (ref 0–53)
AST: 18 U/L (ref 0–37)
Albumin: 4 g/dL (ref 3.5–5.2)
Alkaline Phosphatase: 73 U/L (ref 39–117)
Bilirubin, Direct: 0.1 mg/dL (ref 0.0–0.3)
Total Bilirubin: 0.4 mg/dL (ref 0.3–1.2)
Total Protein: 6.8 g/dL (ref 6.0–8.3)

## 2013-02-10 LAB — PSA: PSA: 2.23 ng/mL (ref 0.10–4.00)

## 2013-02-11 ENCOUNTER — Encounter: Payer: Self-pay | Admitting: Internal Medicine

## 2013-02-11 ENCOUNTER — Ambulatory Visit: Payer: BC Managed Care – PPO

## 2013-02-11 DIAGNOSIS — R7309 Other abnormal glucose: Secondary | ICD-10-CM

## 2013-02-11 LAB — HEMOGLOBIN A1C: Hgb A1c MFr Bld: 5.9 % (ref 4.6–6.5)

## 2013-02-11 LAB — NMR LIPOPROFILE WITH LIPIDS
Cholesterol, Total: 175 mg/dL (ref <200)
HDL Particle Number: 22 umol/L — ABNORMAL LOW (ref >=30.5)
HDL Size: 8.2 nm — ABNORMAL LOW (ref >=9.2)
HDL-C: 31 mg/dL — ABNORMAL LOW (ref >=40)
LDL (calc): 118 mg/dL — ABNORMAL HIGH (ref <100)
LDL Particle Number: 1991 nmol/L — ABNORMAL HIGH (ref <1000)
LDL Size: 20.1 nm — ABNORMAL LOW (ref >20.5)
LP-IR Score: 62 — ABNORMAL HIGH (ref <=45)
Large HDL-P: <1.3 umol/L — ABNORMAL LOW (ref >=4.8)
Large VLDL-P: 0.8 nmol/L (ref <=2.7)
Small LDL Particle Number: 1368 nmol/L — ABNORMAL HIGH (ref <=527)
Triglycerides: 131 mg/dL (ref <150)
VLDL Size: 52.2 nm — ABNORMAL HIGH (ref <=46.6)

## 2013-02-14 ENCOUNTER — Other Ambulatory Visit: Payer: Self-pay | Admitting: Internal Medicine

## 2013-02-17 NOTE — Telephone Encounter (Signed)
Med filled.  

## 2013-06-27 ENCOUNTER — Other Ambulatory Visit: Payer: Self-pay | Admitting: Internal Medicine

## 2013-09-28 ENCOUNTER — Other Ambulatory Visit: Payer: Self-pay | Admitting: Internal Medicine

## 2013-12-02 ENCOUNTER — Other Ambulatory Visit: Payer: Self-pay | Admitting: Internal Medicine

## 2014-02-17 ENCOUNTER — Encounter: Payer: Self-pay | Admitting: Internal Medicine

## 2014-02-17 ENCOUNTER — Ambulatory Visit (INDEPENDENT_AMBULATORY_CARE_PROVIDER_SITE_OTHER): Payer: BC Managed Care – PPO | Admitting: Internal Medicine

## 2014-02-17 VITALS — BP 138/82 | HR 83 | Temp 98.2°F | Resp 13 | Ht 75.0 in | Wt 210.0 lb

## 2014-02-17 DIAGNOSIS — N401 Enlarged prostate with lower urinary tract symptoms: Secondary | ICD-10-CM

## 2014-02-17 DIAGNOSIS — Z860100 Personal history of colon polyps, unspecified: Secondary | ICD-10-CM | POA: Insufficient documentation

## 2014-02-17 DIAGNOSIS — N138 Other obstructive and reflux uropathy: Secondary | ICD-10-CM

## 2014-02-17 DIAGNOSIS — Z0189 Encounter for other specified special examinations: Secondary | ICD-10-CM

## 2014-02-17 DIAGNOSIS — R739 Hyperglycemia, unspecified: Secondary | ICD-10-CM

## 2014-02-17 DIAGNOSIS — Z0000 Encounter for general adult medical examination without abnormal findings: Secondary | ICD-10-CM

## 2014-02-17 DIAGNOSIS — E785 Hyperlipidemia, unspecified: Secondary | ICD-10-CM

## 2014-02-17 NOTE — Patient Instructions (Addendum)
Your next office appointment will be determined based upon review of your pending labs . Those instructions will be transmitted to you through My Chart   Minimal Blood Pressure Goal= AVERAGE < 140/90;  Ideal is an AVERAGE < 135/85. This AVERAGE should be calculated from @ least 5-7 BP readings taken @ different times of day on different days of week. You should not respond to isolated BP readings , but rather the AVERAGE for that week .Please bring your  blood pressure cuff to office visits to verify that it is reliable.It  can also be checked against the blood pressure device at the pharmacy. Finger or wrist cuffs are not dependable; an arm cuff is.  Please sign a release of records to Dr Ferdinand Lango for records related to pathology of colon polyp(s)

## 2014-02-17 NOTE — Progress Notes (Signed)
Subjective:    Patient ID: Charles Tate, male    DOB: 08/02/1950, 63 y.o.   MRN: [id number]  HPI He is here for a physical;acute issues denied.  He is on a modified heart healthy diet; he does not add salt. He is not monitoring his blood pressure.  He has noted some bloodshot appearance to the left eye intermittently. His also noted pounding in his ear at times. He has no other cardiovascular symptoms even with exercise Advanced cholesterol testing reveals his LDL goal is less than 100, ideally less than 70.  He does take ranitidine twice a day. This controls his reflux. He has no other active GI symptoms  In August 2013 a polyp was removed by Dr. Ferdinand Lango; it may have been adenomatous as repeat colonoscopy has been recommended.  He has prostatic hypertrophy. He now has frequency, urgency, & nocturia 2-3 times per night. There is no family history of prostate cancer.     Review of Systems   Chest pain, palpitations, tachycardia, exertional dyspnea, paroxysmal nocturnal dyspnea, claudication or edema are absent.  Unexplained weight loss, abdominal pain, significant, dysphagia, melena, rectal bleeding, or persistently small caliber stools are denied.     Objective:   Physical Exam Gen.: Healthy and well-nourished in appearance. Alert, appropriate and cooperative throughout exam. Appears younger than stated age  Head: Normocephalic without obvious abnormalities; pattern alopecia . Beard & moustache Eyes: No corneal or conjunctival inflammation noted. Pupils equal round reactive to light and accommodation. Extraocular motion intact.  Ears: External  ear exam reveals no significant lesions or deformities. Canals clear .TMs normal. Hearing is grossly normal bilaterally. Nose: External nasal exam reveals no deformity or inflammation. Nasal mucosa are pink and moist. No lesions or exudates noted.   Mouth: Oral mucosa and oropharynx reveal no lesions or exudates. Teeth in good  repair. Neck: No deformities, masses, or tenderness noted. Range of motion &Thyroid normal. Lungs: Normal respiratory effort; chest expands symmetrically. Lungs are clear to auscultation without rales, wheezes, or increased work of breathing. Heart: Normal rate and rhythm. Normal S1 and S2. No gallop, click, or rub.  Grade 8/3-3 over 6 systolic murmur. Abdomen: Slightly protuberant.Bowel sounds normal; abdomen soft and nontender. No masses, organomegaly or hernias noted. Genitalia: Genitalia normal except for left varices. Prostate is minimally enlarged w/o asymmetry, nodularity, or induration    Musculoskeletal/extremities: No deformity or scoliosis noted of  the thoracic or lumbar spine.  No clubbing, cyanosis, edema, or significant extremity  deformity noted.  Range of motion normal . Tone & strength normal. Hand joints normal Fingernail  health good. Crepitus of knees  Able to lie down & sit up w/o help.  Negative SLR bilaterally Vascular: Carotid, radial artery, dorsalis pedis and  posterior tibial pulses are full and equal. No bruits present. Neurologic: Alert and oriented x3. Deep tendon reflexes symmetrical and normal.  Gait normal     Skin: Intact without suspicious lesions or rashes. Lymph: No cervical, axillary, or inguinal lymphadenopathy present. Psych: Mood and affect are normal. Normally interactive  Assessment & Plan:   #1 comprehensive physical exam; no acute findings  Plan: see Orders  & Recommendations

## 2014-02-17 NOTE — Progress Notes (Signed)
Pre visit review using our clinic review tool, if applicable. No additional management support is needed unless otherwise documented below in the visit note. 

## 2014-02-18 ENCOUNTER — Other Ambulatory Visit (INDEPENDENT_AMBULATORY_CARE_PROVIDER_SITE_OTHER): Payer: BC Managed Care – PPO

## 2014-02-18 DIAGNOSIS — Z0189 Encounter for other specified special examinations: Secondary | ICD-10-CM

## 2014-02-18 DIAGNOSIS — Z0000 Encounter for general adult medical examination without abnormal findings: Secondary | ICD-10-CM

## 2014-02-18 LAB — LIPID PANEL
Cholesterol: 201 mg/dL — ABNORMAL HIGH (ref 0–200)
HDL: 29 mg/dL — ABNORMAL LOW (ref >39.00)
LDL Cholesterol: 149 mg/dL — ABNORMAL HIGH (ref 0–99)
NonHDL: 172
Total CHOL/HDL Ratio: 7
Triglycerides: 116 mg/dL (ref 0.0–149.0)
VLDL: 23.2 mg/dL (ref 0.0–40.0)

## 2014-02-18 LAB — TSH: TSH: 1.06 u[IU]/mL (ref 0.35–4.50)

## 2014-02-18 LAB — BASIC METABOLIC PANEL
BUN: 18 mg/dL (ref 6–23)
CO2: 25 mEq/L (ref 19–32)
Calcium: 9.1 mg/dL (ref 8.4–10.5)
Chloride: 105 mEq/L (ref 96–112)
Creatinine, Ser: 0.8 mg/dL (ref 0.4–1.5)
GFR: 97.58 mL/min (ref >60.00)
Glucose, Bld: 102 mg/dL — ABNORMAL HIGH (ref 70–99)
Potassium: 4.2 mEq/L (ref 3.5–5.1)
Sodium: 139 mEq/L (ref 135–145)

## 2014-02-18 LAB — HEPATIC FUNCTION PANEL
ALT: 19 U/L (ref 0–53)
AST: 19 U/L (ref 0–37)
Albumin: 4 g/dL (ref 3.5–5.2)
Alkaline Phosphatase: 66 U/L (ref 39–117)
Bilirubin, Direct: 0.1 mg/dL (ref 0.0–0.3)
Total Bilirubin: 0.8 mg/dL (ref 0.2–1.2)
Total Protein: 6.7 g/dL (ref 6.0–8.3)

## 2014-02-18 LAB — PSA: PSA: 3.64 ng/mL (ref 0.10–4.00)

## 2014-02-18 LAB — CBC WITH DIFFERENTIAL/PLATELET
Basophils Absolute: 0 10*3/uL (ref 0.0–0.1)
Basophils Relative: 0.9 % (ref 0.0–3.0)
Eosinophils Absolute: 0.2 10*3/uL (ref 0.0–0.7)
Eosinophils Relative: 3.1 % (ref 0.0–5.0)
HCT: 45.5 % (ref 39.0–52.0)
Hemoglobin: 15 g/dL (ref 13.0–17.0)
Lymphocytes Relative: 30.3 % (ref 12.0–46.0)
Lymphs Abs: 1.5 10*3/uL (ref 0.7–4.0)
MCHC: 33 g/dL (ref 30.0–36.0)
MCV: 89 fl (ref 78.0–100.0)
Monocytes Absolute: 0.5 10*3/uL (ref 0.1–1.0)
Monocytes Relative: 10.5 % (ref 3.0–12.0)
Neutro Abs: 2.8 10*3/uL (ref 1.4–7.7)
Neutrophils Relative %: 55.2 % (ref 43.0–77.0)
Platelets: 222 10*3/uL (ref 150.0–400.0)
RBC: 5.12 Mil/uL (ref 4.22–5.81)
RDW: 12.5 % (ref 11.5–15.5)
WBC: 5 10*3/uL (ref 4.0–10.5)

## 2014-02-18 LAB — HEMOGLOBIN A1C: Hgb A1c MFr Bld: 6 % (ref 4.6–6.5)

## 2014-02-19 ENCOUNTER — Telehealth: Payer: Self-pay | Admitting: Internal Medicine

## 2014-02-19 NOTE — Telephone Encounter (Signed)
Rec'd from [facility] forward 4 pages to Dr. Linna Darner

## 2014-02-21 ENCOUNTER — Other Ambulatory Visit: Payer: Self-pay | Admitting: Internal Medicine

## 2014-02-21 DIAGNOSIS — E785 Hyperlipidemia, unspecified: Secondary | ICD-10-CM

## 2014-02-21 DIAGNOSIS — R972 Elevated prostate specific antigen [PSA]: Secondary | ICD-10-CM

## 2014-02-21 DIAGNOSIS — N401 Enlarged prostate with lower urinary tract symptoms: Secondary | ICD-10-CM

## 2014-02-21 DIAGNOSIS — N138 Other obstructive and reflux uropathy: Secondary | ICD-10-CM

## 2014-03-31 ENCOUNTER — Other Ambulatory Visit: Payer: Self-pay | Admitting: Internal Medicine

## 2014-04-05 ENCOUNTER — Other Ambulatory Visit: Payer: Self-pay

## 2014-04-05 MED ORDER — LOSARTAN POTASSIUM-HCTZ 100-12.5 MG PO TABS: 1.0000 | ORAL_TABLET | Freq: Every day | ORAL | Status: DC

## 2014-09-28 ENCOUNTER — Other Ambulatory Visit: Payer: Self-pay | Admitting: Internal Medicine

## 2014-12-28 ENCOUNTER — Other Ambulatory Visit: Payer: Self-pay | Admitting: Internal Medicine

## 2015-02-25 ENCOUNTER — Other Ambulatory Visit: Payer: Self-pay | Admitting: Internal Medicine

## 2015-03-02 ENCOUNTER — Ambulatory Visit (INDEPENDENT_AMBULATORY_CARE_PROVIDER_SITE_OTHER): Payer: PPO | Admitting: Internal Medicine

## 2015-03-02 ENCOUNTER — Encounter: Payer: Self-pay | Admitting: Internal Medicine

## 2015-03-02 VITALS — BP 140/90 | HR 87 | Temp 98.1°F | Resp 20 | Ht 75.0 in | Wt 214.2 lb

## 2015-03-02 DIAGNOSIS — I10 Essential (primary) hypertension: Secondary | ICD-10-CM | POA: Diagnosis not present

## 2015-03-02 DIAGNOSIS — E785 Hyperlipidemia, unspecified: Secondary | ICD-10-CM

## 2015-03-02 DIAGNOSIS — R739 Hyperglycemia, unspecified: Secondary | ICD-10-CM | POA: Diagnosis not present

## 2015-03-02 DIAGNOSIS — J302 Other seasonal allergic rhinitis: Secondary | ICD-10-CM | POA: Diagnosis not present

## 2015-03-02 MED ORDER — RANITIDINE HCL 150 MG PO TABS: 150.0000 mg | ORAL_TABLET | Freq: Two times a day (BID) | ORAL | Status: DC

## 2015-03-02 MED ORDER — LOSARTAN POTASSIUM-HCTZ 100-12.5 MG PO TABS: 1.0000 | ORAL_TABLET | Freq: Every day | ORAL | Status: DC

## 2015-03-02 NOTE — Assessment & Plan Note (Signed)
Lipids, LFTs, TSH  

## 2015-03-02 NOTE — Patient Instructions (Addendum)
  Your next office appointment will be determined based upon review of your pending labs.  Those written interpretation of the lab results and instructions will be transmitted to you by mail for your records.  Critical results will be called.   Followup as needed for any active or acute issue. Please report any significant change in your symptoms.   Plain Mucinex (NOT D) for thick secretions ;force NON dairy fluids .   Nasal cleansing in the shower as discussed with lather of mild shampoo.After 10 seconds wash off lather while  exhaling through nostrils. Make sure that all residual soap is removed to prevent irritation.  Flonase OR Nasacort AQ 1 spray in each nostril twice a day as needed. Use the "crossover" technique into opposite nostril spraying toward opposite ear @ 45 degree angle, not straight up into nostril.  Plain Allegra (NOT D )  160 daily , Loratidine 10 mg , OR Zyrtec 10 mg @ bedtime  as needed for itchy eyes & sneezing.  Minimal Blood Pressure Goal= AVERAGE < 140/90;  Ideal is an AVERAGE < 135/85. This AVERAGE should be calculated from @ least 5-7 BP readings taken @ different times of day on different days of week. You should not respond to isolated BP readings , but rather the AVERAGE for that week .Please bring your  blood pressure cuff to office visits to verify that it is reliable.It  can also be checked against the blood pressure device at the pharmacy. Finger or wrist cuffs are not dependable; an arm cuff is.

## 2015-03-02 NOTE — Progress Notes (Signed)
Pre visit review using our clinic review tool, if applicable. No additional management support is needed unless otherwise documented below in the visit note. 

## 2015-03-02 NOTE — Assessment & Plan Note (Signed)
Blood pressure goals reviewed. BMET 

## 2015-03-02 NOTE — Assessment & Plan Note (Signed)
A1c

## 2015-03-02 NOTE — Progress Notes (Signed)
   Subjective:    Patient ID: Charles Tate, male    DOB: 13-Apr-1950, 64 y.o.   MRN: [id number]  HPI The patient is here to assess status of active health conditions.  PMH, FH, & Social History reviewed & updated.No change in [place] as recorded.  He's on modified heart healthy diet. He had been exercising for 10-20 minutes as walking or treadmill several days per week until the holidays. He ran out of his blood pressure pills today. At home his blood pressures averages 120 over the 70s.  He denies any active cardio pulmonary symptoms.  In the last 6 days he's had rhinitis type symptoms which he feels is related to his Christmas tree. This is associated with postnasal drainage and sore throat particularly in the morning. He has some sneezing as well as some itchy, watery eyes. All secretions are clear. He has had some chills and aching. He denies frontal sinus or maxillary sinus pain or nasal purulence. He has no associated fever, shortness of breath, wheezing.  Review of Systems  Chest pain, palpitations, tachycardia, exertional dyspnea, paroxysmal nocturnal dyspnea, claudication or edema are absent. No unexplained weight loss, abdominal pain, significant dyspepsia, dysphagia, melena, rectal bleeding, or persistently small caliber stools. Dysuria, pyuria, hematuria, frequency, nocturia or polyuria are denied. Change in hair, skin, nails denied. No bowel changes of constipation or diarrhea. No intolerance to heat or cold.     Objective:   Physical Exam Pertinent or positive findings include: Pattern alopecia is present. There is marked erythema of the nasal mucosa. He has mild crepitus in the knees.  General appearance :adequately nourished; in no distress.  Eyes: No conjunctival inflammation or scleral icterus is present.  Oral exam:  Lips and gums are healthy appearing.There is no oropharyngeal erythema or exudate noted. Dental hygiene is good.  Heart:  Normal rate and regular rhythm.  S1 and S2 normal without gallop, murmur, click, rub or other extra sounds    Lungs:Chest clear to auscultation; no wheezes, rhonchi,rales ,or rubs present.No increased work of breathing.   Abdomen: bowel sounds normal, soft and non-tender without masses, organomegaly or hernias noted.  No guarding or rebound.   Vascular : all pulses equal ; no bruits present.  Skin:Warm & dry.  Intact without suspicious lesions or rashes ; no tenting or jaundice   Lymphatic: No lymphadenopathy is noted about the head, neck, axilla.   Neuro: Strength, tone & DTRs normal.    Assessment & Plan:  #1 See Current Assessment & Plan in Problem List under specific Diagnosis #2 allergic rhinitis

## 2015-03-24 ENCOUNTER — Telehealth: Payer: Self-pay

## 2015-03-24 ENCOUNTER — Other Ambulatory Visit (INDEPENDENT_AMBULATORY_CARE_PROVIDER_SITE_OTHER): Payer: PPO

## 2015-03-24 DIAGNOSIS — R739 Hyperglycemia, unspecified: Secondary | ICD-10-CM | POA: Diagnosis not present

## 2015-03-24 DIAGNOSIS — I10 Essential (primary) hypertension: Secondary | ICD-10-CM

## 2015-03-24 DIAGNOSIS — E785 Hyperlipidemia, unspecified: Secondary | ICD-10-CM | POA: Diagnosis not present

## 2015-03-24 LAB — HEPATIC FUNCTION PANEL
ALT: 16 U/L (ref 0–53)
AST: 15 U/L (ref 0–37)
Albumin: 4.1 g/dL (ref 3.5–5.2)
Alkaline Phosphatase: 72 U/L (ref 39–117)
Bilirubin, Direct: 0.1 mg/dL (ref 0.0–0.3)
Total Bilirubin: 0.5 mg/dL (ref 0.2–1.2)
Total Protein: 7.2 g/dL (ref 6.0–8.3)

## 2015-03-24 LAB — LIPID PANEL
Cholesterol: 191 mg/dL (ref 0–200)
HDL: 33.2 mg/dL — ABNORMAL LOW (ref >39.00)
LDL Cholesterol: 131 mg/dL — ABNORMAL HIGH (ref 0–99)
NonHDL: 157.59
Total CHOL/HDL Ratio: 6
Triglycerides: 134 mg/dL (ref 0.0–149.0)
VLDL: 26.8 mg/dL (ref 0.0–40.0)

## 2015-03-24 LAB — BASIC METABOLIC PANEL
BUN: 16 mg/dL (ref 6–23)
CO2: 30 mEq/L (ref 19–32)
Calcium: 9.3 mg/dL (ref 8.4–10.5)
Chloride: 103 mEq/L (ref 96–112)
Creatinine, Ser: 0.88 mg/dL (ref 0.40–1.50)
GFR: 92.17 mL/min (ref >60.00)
Glucose, Bld: 100 mg/dL — ABNORMAL HIGH (ref 70–99)
Potassium: 4.1 mEq/L (ref 3.5–5.1)
Sodium: 140 mEq/L (ref 135–145)

## 2015-03-24 LAB — TSH: TSH: 1.41 u[IU]/mL (ref 0.35–4.50)

## 2015-03-24 LAB — HEMOGLOBIN A1C: Hgb A1c MFr Bld: 5.8 % (ref 4.6–6.5)

## 2015-03-24 NOTE — Telephone Encounter (Signed)
Left message advising patient to call  Back to let us know which pcp he is wanting to establish with---when patient calls back, let Sherra Kimmons know so that lab results can be sent to new pcp

## 2015-03-24 NOTE — Telephone Encounter (Signed)
Patient is going to switch over to Charles Tate.  I have scheduled CPE for March.

## 2015-03-24 NOTE — Telephone Encounter (Signed)
Thyroid function, liver tests, kidney function normal.  Cholesterol is good - stable compared to last few years.  a1c is 5.8 - sugar have been better controlled.

## 2015-03-24 NOTE — Telephone Encounter (Signed)
Routing lab results to dr burns, new patient, to be established with office visit in march--can you please review labs that resulted to dr hoppers inbasket and advise, thanks

## 2015-03-25 ENCOUNTER — Encounter: Payer: Self-pay | Admitting: Internal Medicine

## 2015-03-25 NOTE — Telephone Encounter (Signed)
Advised patient of dr burns note, reminded patient of appt to get established

## 2015-05-27 ENCOUNTER — Ambulatory Visit (INDEPENDENT_AMBULATORY_CARE_PROVIDER_SITE_OTHER): Payer: PPO | Admitting: Internal Medicine

## 2015-05-27 ENCOUNTER — Encounter: Payer: Self-pay | Admitting: Internal Medicine

## 2015-05-27 VITALS — BP 134/86 | HR 79 | Temp 98.5°F | Resp 16 | Wt 214.0 lb

## 2015-05-27 DIAGNOSIS — K219 Gastro-esophageal reflux disease without esophagitis: Secondary | ICD-10-CM | POA: Diagnosis not present

## 2015-05-27 DIAGNOSIS — I10 Essential (primary) hypertension: Secondary | ICD-10-CM | POA: Diagnosis not present

## 2015-05-27 DIAGNOSIS — E785 Hyperlipidemia, unspecified: Secondary | ICD-10-CM

## 2015-05-27 DIAGNOSIS — R739 Hyperglycemia, unspecified: Secondary | ICD-10-CM | POA: Diagnosis not present

## 2015-05-27 MED ORDER — FLUTICASONE PROPIONATE 50 MCG/ACT NA SUSP: 2.0000 | Freq: Every day | NASAL | Status: DC

## 2015-05-27 NOTE — Assessment & Plan Note (Signed)
improved with diet changes Start exercise Check annually

## 2015-05-27 NOTE — Assessment & Plan Note (Addendum)
Controlled Taking zantac twice daily Continue zantac BID

## 2015-05-27 NOTE — Progress Notes (Signed)
Pre visit review using our clinic review tool, if applicable. No additional management support is needed unless otherwise documented below in the visit note. 

## 2015-05-27 NOTE — Patient Instructions (Signed)
   All other Health Maintenance issues reviewed.   All recommended immunizations and age-appropriate screenings are up-to-date or discussed.  No immunizations administered today.   Medications reviewed and updated.  No changes recommended at this time.  Your flonase prescription(s) have been submitted to your pharmacy. Please take as directed and contact our office if you believe you are having problem(s) with the medication(s).

## 2015-05-27 NOTE — Assessment & Plan Note (Signed)
Sugar control improved with diet changes Start exercise Check annually

## 2015-05-27 NOTE — Assessment & Plan Note (Signed)
BP well controlled Current regimen effective and well tolerated Continue current medications at current doses  

## 2015-05-27 NOTE — Progress Notes (Signed)
Subjective:    Patient ID: Charles Tate, male    DOB: June 20, 1950, 65 y.o.   MRN: [id number]  HPI He is here to establish with a new pcp.   He is here for follow up.   Hypertension: He is taking his medication daily. He is compliant with a low sodium diet.  He denies chest pain, palpitations, edema, shortness of breath and regular headaches. He is not exercising regularly.  He does monitor his blood pressure at home periodically.    GERD:  He is taking his medication twice daily as prescribed.  He denies any GERD symptoms and feels his GERD is well controlled.   Hyperglycemia/prediabetes:  He has made changes in his diet.  He is not exercising regularly, but plans on starting.    Hyperlipidemia:  He has made changes in his diet.  He is currently not exercising regularly.    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  after doing this he often wakes up with a stiff hip and once he gets moving it gets better.  He knows this is probably from the carpet cleaning.  The toes in his left foot feel tingling at times.  He has tightness in the left uper back and sometimes with pressure on that area he gets tingling in the left arm.  He also plays a guitar, which is contributing to this. Stress also aggravates his upper back muscle tightness. He feels he needs to get to the gym and that may help.  He does not take nsaids regularly - just on occasion.   Sinus congestion, yellow mucus occasion, no pain or pressure.  He has done saline spray and Claritin on occasion, but is not taking them recently.  He denies fever.      Medications and allergies reviewed with patient and updated if appropriate.  Patient Active Problem List   Diagnosis Date Noted  . Rising PSA level 02/21/2014  . History of colon polyps 02/17/2014  . 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 Hyperlipidemia 01/07/2009  . Hyperglycemia 01/07/2009  . REACTIVE  AIRWAY DISEASE 06/04/2007  . GERD 06/04/2007  . Essential hypertension 09/26/2006    Current Outpatient Prescriptions on File Prior to Visit  Medication Sig Dispense Refill  . losartan-hydrochlorothiazide (HYZAAR) 100-12.5 MG tablet Take 1 tablet by mouth daily. 90 tablet 3  . Multiple Vitamin (MULTIVITAMIN) capsule Take 1 capsule by mouth daily.      . ranitidine (ZANTAC) 150 MG tablet Take 1 tablet (150 mg total) by mouth every 12 (twelve) hours. 180 tablet 3   No current facility-administered medications on file prior to visit.    Past Medical History  Diagnosis Date  . Hypertension   . RAD (reactive airway disease)     with RTIs  . GERD (gastroesophageal reflux disease)   . Hyperlipidemia     Past Surgical History  Procedure Laterality Date  . Wisdom tooth extraction    . Colonoscopy with polypectomy  2013    Dr Ferdinand Lango, [place] History   Social History  . Marital Status: Married    Spouse Name: N/A  . Number of Children: N/A  . Years of Education: N/A   Social History Main Topics  . Smoking status: Former Research scientist (life sciences)  . Smokeless tobacco: Not on file     Comment: intermittent, short term smoker as a teen. Some second hand smoke in  20s & 30s  . Alcohol Use: Yes     Comment:   very rarely  . Drug Use: No  . Sexual Activity: Not on file   Other Topics Concern  . Not on file   Social History Narrative    Family History  Problem Relation Age of Onset  . Arthritis Mother   . Hyperlipidemia Mother   . Heart attack Father 68  . Hypertension Sister   . Heart attack Paternal Grandmother 54  . Diabetes Paternal Aunt   . Stroke Maternal Grandfather 58  . Cancer Paternal Grandfather     ? stomach; in 35s  . Lupus Mother     Review of Systems  Constitutional: Negative for fever.  HENT: Positive for congestion and postnasal drip. Negative for sinus pressure.   Respiratory: Positive for wheezing. Negative for cough and shortness of breath.     Cardiovascular: Negative for chest pain, palpitations and leg swelling.  Gastrointestinal:       Jerrye Bushy controlled  Neurological: Negative for dizziness, light-headedness and headaches.       Objective:   Filed Vitals:   05/27/15 0842  BP: 134/86  Pulse: 79  Temp: 98.5 F (36.9 C)  Resp: 16   Filed Weights   05/27/15 0842  Weight: 214 lb (97.07 kg)   Body mass index is 26.75 kg/(m^2).   Physical Exam Constitutional: Appears well-developed and well-nourished. No distress.  Neck: Neck supple. No tracheal deviation present. No thyromegaly present.  No carotid bruit. No cervical adenopathy.   Cardiovascular: Normal rate, regular rhythm and normal heart sounds.   No murmur heard.  No edema Pulmonary/Chest: Effort normal and breath sounds normal. No respiratory distress. No wheezes.       Assessment & Plan:    Nasal congestion, PND Likely allergy related Start claritin, start flonase and saline nasal spray  See Problem List for Assessment and Plan of chronic medical problems.  Follow up annually for a PE

## 2015-06-07 ENCOUNTER — Encounter: Payer: Self-pay | Admitting: Family Medicine

## 2015-06-07 ENCOUNTER — Ambulatory Visit (INDEPENDENT_AMBULATORY_CARE_PROVIDER_SITE_OTHER): Payer: PPO | Admitting: Family Medicine

## 2015-06-07 VITALS — BP 126/80 | HR 67 | Temp 98.2°F | Resp 16 | Wt 208.2 lb

## 2015-06-07 DIAGNOSIS — J0100 Acute maxillary sinusitis, unspecified: Secondary | ICD-10-CM

## 2015-06-07 MED ORDER — AMOXICILLIN 875 MG PO TABS: 875.0000 mg | ORAL_TABLET | Freq: Two times a day (BID) | ORAL | Status: DC

## 2015-06-07 MED ORDER — ALBUTEROL SULFATE HFA 108 (90 BASE) MCG/ACT IN AERS: 2.0000 | INHALATION_SPRAY | Freq: Four times a day (QID) | RESPIRATORY_TRACT | Status: DC | PRN

## 2015-06-07 NOTE — Progress Notes (Signed)
   Subjective:    Patient ID: Charles Tate, male    DOB: 18-Nov-1950, 65 y.o.   MRN: [id number]  HPI URI- pt reports sxs of sinus infxns.  Typically uses Flonase and saline rinses w/ some improvement.  Pt reports increased PND and allergy sxs but 'now it's in my chest'.  Using Claritin and Mucinex w/ some improvement.  Cough is not productive- 'it's just a tight, wheezy, tickle'.  L maxillary sinus pain w/ tooth pain.  No fevers.  + nasal congestion.  + sick contacts. Mild dizziness.   Review of Systems For ROS see HPI     Objective:   Physical Exam  Constitutional: He appears well-developed and well-nourished. No distress.  HENT:  Head: Normocephalic and atraumatic.  Right Ear: Tympanic membrane normal.  Left Ear: Tympanic membrane normal.  Nose: Mucosal edema and rhinorrhea present. Right sinus exhibits no maxillary sinus tenderness and no frontal sinus tenderness. Left sinus exhibits maxillary sinus tenderness. Left sinus exhibits no frontal sinus tenderness.  Mouth/Throat: Mucous membranes are normal. Oropharyngeal exudate and posterior oropharyngeal erythema present. No posterior oropharyngeal edema.  + PND  Eyes: Conjunctivae and EOM are normal. Pupils are equal, round, and reactive to light.  Neck: Normal range of motion. Neck supple.  Cardiovascular: Normal rate, regular rhythm and normal heart sounds.   Pulmonary/Chest: Effort normal and breath sounds normal. No respiratory distress. He has no wheezes.  + hacking cough  Lymphadenopathy:    He has no cervical adenopathy.  Skin: Skin is warm and dry.  Vitals reviewed.         Assessment & Plan:

## 2015-06-07 NOTE — Patient Instructions (Signed)
Follow up as needed Start the Amoxicillin twice daily- take w/ food Drink plenty of fluids REST! Use the albuterol inhaler- 2 puffs every 4 hrs as needed for shortness of breath/wheezing Continue Claritin, Flonase, Mucinex DM Call with any questions or concerns Hang in there!!!

## 2015-06-07 NOTE — Assessment & Plan Note (Signed)
Pt's sxs and PE consistent w/ infxn.  Due to hx of RAD, will start albuterol inhaler prn.  Amox for infxn.  Continue daily allergy medication.  Reviewed supportive care and red flags that should prompt return.  Pt expressed understanding and is in agreement w/ plan.

## 2015-06-07 NOTE — Progress Notes (Signed)
Pre visit review using our clinic review tool, if applicable. No additional management support is needed unless otherwise documented below in the visit note. 

## 2015-08-08 ENCOUNTER — Encounter: Payer: Self-pay | Admitting: Medical

## 2015-08-08 ENCOUNTER — Ambulatory Visit (INDEPENDENT_AMBULATORY_CARE_PROVIDER_SITE_OTHER): Payer: PPO | Admitting: Medical

## 2015-08-08 VITALS — BP 116/78 | HR 82 | Ht 75.0 in | Wt 210.2 lb

## 2015-08-08 DIAGNOSIS — H938X3 Other specified disorders of ear, bilateral: Secondary | ICD-10-CM

## 2015-08-08 DIAGNOSIS — R591 Generalized enlarged lymph nodes: Secondary | ICD-10-CM | POA: Diagnosis not present

## 2015-08-08 DIAGNOSIS — J029 Acute pharyngitis, unspecified: Secondary | ICD-10-CM

## 2015-08-08 DIAGNOSIS — J309 Allergic rhinitis, unspecified: Secondary | ICD-10-CM | POA: Diagnosis not present

## 2015-08-08 LAB — POCT RAPID STREP A (OFFICE): Rapid Strep A Screen: NEGATIVE

## 2015-08-08 MED ORDER — BENZONATATE 100 MG PO CAPS: 100.0000 mg | ORAL_CAPSULE | Freq: Three times a day (TID) | ORAL | Status: DC | PRN

## 2015-08-08 MED ORDER — AMOXICILLIN-POT CLAVULANATE 875-125 MG PO TABS: 1.0000 | ORAL_TABLET | Freq: Two times a day (BID) | ORAL | Status: DC

## 2015-08-08 MED ORDER — METHYLPREDNISOLONE ACETATE 40 MG/ML IJ SUSP
40.0000 mg | Freq: Once | INTRAMUSCULAR | Status: AC
  Administered 2015-08-08: 40 mg via INTRAMUSCULAR

## 2015-08-08 NOTE — Patient Instructions (Addendum)
For allergic rhinitis continue. 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  You have mild hoarse voice. Please rest your voice. Hydrate well.(if laryngitis type symptoms arise and persist then would recommend laryngoscopy)  You do have obvious lymph node swollen on both sides but more so on left side. Your rapid strep test was negative. I am giving you augmentin if has coverage for strep in event rapid strep was negative, coverage of other regional infections including tooth infections.   For cough benzonatate.  Follow up in 10 days or as needed

## 2015-08-08 NOTE — Progress Notes (Signed)
Pre visit review using our clinic review tool, if applicable. No additional management support is needed unless otherwise documented below in the visit note. 

## 2015-08-08 NOTE — Progress Notes (Signed)
Subjective:    Patient ID: Charles Tate, male    DOB: 1950-03-20, 65 y.o.   MRN: [id number]  HPI  Pt in with some sinus pressure/minimal(he states can feel a lot of pnd). Pt feels some slight swollen glands in his neck. Also some slight pain on swallowing. Some ear pressure. Going on for about one week.  Pt saw Dr. Birdie Riddle in June 07, 2015. She gave him amoxicillin.(he states at that time similar symptoms.   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 So he want to get better quickly.   Pt can feel some pnd. Cut grass last week no mask.  Some allergy symptoms in past with cutting grass.  Pt expresses in past that one time in the past felt similar symptoms of swelling around neck/lymph nodes. Had tooth infection that was severe and needed antibiotics. Though  he does not report any tooth pain or mandible pain.    Review of Systems  Constitutional: Negative for fever, chills and fatigue.  HENT: Positive for congestion, ear pain, sinus pressure and sore throat.   Respiratory: Negative for cough, chest tightness, wheezing and stridor.   Cardiovascular: Negative for chest pain and palpitations.  Gastrointestinal: Positive for abdominal pain.  Neurological: Negative for dizziness, speech difficulty, weakness and headaches.  Hematological: Positive for adenopathy.  Psychiatric/Behavioral: Negative for behavioral problems and confusion.    Past Medical History  Diagnosis Date  . Hypertension   . RAD (reactive airway disease)     with RTIs  . GERD (gastroesophageal reflux disease)   . Hyperlipidemia      Social History   Social History  . Marital Status: Married    Spouse Name: N/A  . Number of Children: N/A  . Years of Education: N/A   Occupational History  . Not on file.   Social History Main Topics  . Smoking status: Former Research scientist (life sciences)  . Smokeless tobacco: Not on file     Comment: intermittent, short term smoker as a teen. Some second hand smoke in 20s & 30s  .  Alcohol Use: Yes     Comment:   very rarely  . Drug Use: No  . Sexual Activity: Not on file   Other Topics Concern  . Not on file   Social History Narrative    Past Surgical History  Procedure Laterality Date  . Wisdom tooth extraction    . Colonoscopy with polypectomy  2013    Dr Ferdinand Lango, [facility]    Family History  Problem Relation Age of Onset  . Arthritis Mother   . Hyperlipidemia Mother   . Heart attack Father 59  . Hypertension Sister   . Heart attack Paternal Grandmother 50  . Diabetes Paternal Aunt   . Stroke Maternal Grandfather 65  . Cancer Paternal Grandfather     ? stomach; in 38s  . Lupus Mother     No Known Allergies  Current Outpatient Prescriptions on File Prior to Visit  Medication Sig Dispense Refill  . albuterol (PROVENTIL HFA;VENTOLIN HFA) 108 (90 Base) MCG/ACT inhaler Inhale 2 puffs into the lungs every 6 (six) hours as needed for wheezing or shortness of breath. 1 Inhaler 2  . amoxicillin (AMOXIL) 875 MG tablet Take 1 tablet (875 mg total) by mouth 2 (two) times daily. 20 tablet 0  . fluticasone (FLONASE) 50 MCG/ACT nasal spray Place 2 sprays into both nostrils daily. 16 g 6  . losartan-hydrochlorothiazide (HYZAAR) 100-12.5 MG tablet Take 1 tablet by mouth daily.  90 tablet 3  . Multiple Vitamin (MULTIVITAMIN) capsule Take 1 capsule by mouth daily.      . ranitidine (ZANTAC) 150 MG tablet Take 1 tablet (150 mg total) by mouth every 12 (twelve) hours. 180 tablet 3   No current facility-administered medications on file prior to visit.    BP 116/78 mmHg  Pulse 82  Ht 6\' 3"  (1.905 m)  Wt 210 lb 3.2 oz (95.346 kg)  BMI 26.27 kg/m2  SpO2 97%      Objective:   Physical Exam   General  Mental Status - Alert. General Appearance - Well groomed. Not in acute distress.  Skin Rashes- No Rashes.  HEENT Head- Normal. Ear Auditory Canal - Left- Normal. Right - Normal.Tympanic Membrane- Left- Normal. Right- Normal. Eye Sclera/Conjunctiva-  Left- Normal. Right- Normal. Nose & Sinuses Nasal Mucosa- Left-  Boggy and Congested. Right-  Boggy and  Congested.Bilateral no maxillary and  No frontal sinus pressure. Mouth & Throat Lips: Upper Lip- Normal: no dryness, cracking, pallor, cyanosis, or vesicular eruption. Lower Lip-Normal: no dryness, cracking, pallor, cyanosis or vesicular eruption. Buccal Mucosa- Bilateral- No Aphthous ulcers. Oropharynx- No Discharge or Erythema. + pnd Tonsils: Characteristics- Bilateral- Erythema mild but no Congestion. Size/Enlargement- Bilateral- No enlargement. Discharge- bilateral-None.(no swelling of floor of mouth). No obvious tooth infection on inspection) mandible on either side not tender.  Neck Neck- Supple. No Masses.   Chest and Lung Exam Auscultation: Breath Sounds:-Clear even and unlabored.  Cardiovascular Auscultation:Rythm- Regular, rate and rhythm. Murmurs & Other Heart Sounds:Ausculatation of the heart reveal- No Murmurs.  Lymphatic Head & Neck General Head & Neck Lymphatics: Bilateral: Description- mild submandibular node lymphadenopathy.More noticeable on left side.      Assessment & Plan:  For allergic rhinitis continue. 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  You have mild hoarse voice. Please rest your voice. Hydrate well. (if laryngitis type symptoms arise and persist then would recommend laryngoscopy)  You do have obvious lymph node swollen on both sides but more so on left side. Your rapid strep test was negative. I am giving you augmentin if has coverage for strep in event rapid strep was negative, coverage of other regional infections including tooth infections.   For cough benzonatate.  If left submandibular area node persists and tender may need to see dentist for xrays.  Follow up in 10 days or as needed  Lashaundra Lehrmann, Percell Miller, [employer]

## 2015-12-27 DIAGNOSIS — R7303 Prediabetes: Secondary | ICD-10-CM | POA: Insufficient documentation

## 2015-12-27 NOTE — Progress Notes (Addendum)
Subjective:    Patient ID: Charles Tate, male    DOB: 1951-01-28, 65 y.o.   MRN: [id number]  HPI He is here for a physical exam.   He has allergy symptoms which he is taking flonase for.  He feels his symptoms are getting better.  He denies fevers or sinus pain/pressure.  He denies sore throat.    He denies any change in his health.  He is concerned about his risk for heart disease.  He has a history of rheumatic heart disease and his father died of an MI.   Medications and allergies reviewed with patient and updated if appropriate.  Patient Active Problem List   Diagnosis Date Noted  . Allergic rhinitis 12/28/2015  . H/O: rheumatic fever 12/28/2015  . Undiagnosed cardiac murmurs 12/28/2015  . Prediabetes 12/27/2015  . Rising PSA level 02/21/2014  . History of colon polyps 02/17/2014  . 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 Hyperlipidemia 01/07/2009  . REACTIVE AIRWAY DISEASE 06/04/2007  . GERD 06/04/2007  . Essential hypertension 09/26/2006    Current Outpatient Prescriptions on File Prior to Visit  Medication Sig Dispense Refill  . albuterol (PROVENTIL HFA;VENTOLIN HFA) 108 (90 Base) MCG/ACT inhaler Inhale 2 puffs into the lungs every 6 (six) hours as needed for wheezing or shortness of breath. 1 Inhaler 2  . fluticasone (FLONASE) 50 MCG/ACT nasal spray Place 2 sprays into both nostrils daily. 16 g 6  . losartan-hydrochlorothiazide (HYZAAR) 100-12.5 MG tablet Take 1 tablet by mouth daily. 90 tablet 3  . Multiple Vitamin (MULTIVITAMIN) capsule Take 1 capsule by mouth daily.      . ranitidine (ZANTAC) 150 MG tablet Take 1 tablet (150 mg total) by mouth every 12 (twelve) hours. 180 tablet 3   No current facility-administered medications on file prior to visit.     Past Medical History:  Diagnosis Date  . GERD (gastroesophageal reflux disease)   . Hyperlipidemia   . Hypertension   . RAD (reactive airway disease)    with RTIs    Past  Surgical History:  Procedure Laterality Date  . colonoscopy with polypectomy  2013   Dr Ferdinand Lango, [facility]  . WISDOM TOOTH EXTRACTION      Social History   Social History  . Marital status: Married    Spouse name: N/A  . Number of children: N/A  . Years of education: N/A   Social History Main Topics  . Smoking status: Former Research scientist (life sciences)  . Smokeless tobacco: Never Used     Comment: intermittent, short term smoker as a teen. Some second hand smoke in 20s & 30s  . Alcohol use Yes     Comment:   very rarely  . Drug use: No  . Sexual activity: Not Asked   Other Topics Concern  . None   Social History Narrative   Exercise: none regularly    Family History  Problem Relation Age of Onset  . Arthritis Mother   . Hyperlipidemia Mother   . Lupus Mother   . Heart attack Father 18  . Hypertension Sister   . Heart attack Paternal Grandmother 73  . Diabetes Paternal Aunt   . Stroke Maternal Grandfather 44  . Cancer Paternal Grandfather     ? stomach; in 35s    Review of Systems  Constitutional: Negative for appetite change, chills, fatigue, fever and unexpected weight change.  HENT: Positive for postnasal drip (allergy related). Negative for ear pain, sinus pressure and sore throat.  Eyes: Negative for visual disturbance.  Respiratory: Positive for cough (from allergies). Negative for shortness of breath and wheezing.   Cardiovascular: Negative for chest pain, palpitations and leg swelling.  Gastrointestinal: Negative for abdominal pain, blood in stool, constipation, diarrhea and nausea.       No gerd  Endocrine: Negative for polydipsia and polyuria.  Genitourinary: Negative for difficulty urinating, dysuria and hematuria.  Musculoskeletal: Positive for arthralgias (mild). Negative for back pain.  Skin: Negative for color change and rash.  Neurological: Positive for light-headedness (changes in position). Negative for headaches.  Psychiatric/Behavioral: Negative for dysphoric  mood. The patient is not nervous/anxious.        Objective:   Vitals:   12/28/15 0801  BP: 134/82  Pulse: 62  Resp: 16  Temp: 98.4 F (36.9 C)   Filed Weights   12/28/15 0801  Weight: 209 lb (94.8 kg)   Body mass index is 26.12 kg/m.   Physical Exam Constitutional: He appears well-developed and well-nourished. No distress.  HENT:  Head: Normocephalic and atraumatic.  Right Ear: External ear normal.  Left Ear: External ear normal.  Mouth/Throat: Oropharynx is clear and moist.  Normal ear canals and TM b/l  Eyes: Conjunctivae and EOM are normal.  Neck: Neck supple. No tracheal deviation present. No thyromegaly present.  No carotid bruit  Cardiovascular: Normal rate, regular rhythm, normal heart sounds and intact distal pulses.  1/6 diastolic murmur heard. Pulmonary/Chest: Effort normal and breath sounds normal. No respiratory distress. He has no wheezes. He has no rales.  Abdominal: Soft. Bowel sounds are normal. He exhibits no distension. There is no tenderness.  Genitourinary: deferred to urology Musculoskeletal: He exhibits no edema.  Lymphadenopathy:   He has no cervical adenopathy.  Skin: Skin is warm and dry. He is not diaphoretic.  Psychiatric: He has a normal mood and affect. His behavior is normal.      Assessment & Plan:   Physical exam: Screening blood work  ordered Immunizations  Discussed prevnar and shingles vaccine Colonoscopy  Up to date  Eye exams  Up to date  EKG  - last done 2013 Exercise  -  No regular exercise -- stressed regular exercise Weight - BMI just above normal -work on weight loss Skin no concerns - will see derm again Substance abuse - none  See Problem List for Assessment and Plan of chronic medical problems.   F/u annually

## 2015-12-27 NOTE — Patient Instructions (Addendum)
Test(s) ordered today. Your results will be released to [place] (or called to you) after review, usually within 72hours after test completion. If any changes need to be made, you will be notified at that same time.  All other Health Maintenance issues reviewed.   All recommended immunizations and age-appropriate screenings are up-to-date or discussed.  No immunizations administered today.  Medications reviewed and updated.  No changes recommended at this time.   An Echo was ordered.   Please followup in one year  Health Maintenance, Male A healthy lifestyle and preventative care can promote health and wellness.  Maintain regular health, dental, and eye exams.  Eat a healthy diet. Foods like vegetables, fruits, whole grains, low-fat dairy products, and lean protein foods contain the nutrients you need and are low in calories. Decrease your intake of foods high in solid fats, added sugars, and salt. Get information about a proper diet from your health care provider, if necessary.  Regular physical exercise is one of the most important things you can do for your health. Most adults should get at least 150 minutes of moderate-intensity exercise (any activity that increases your heart rate and causes you to sweat) each week. In addition, most adults need muscle-strengthening exercises on 2 or more days a week.   Maintain a healthy weight. The body mass index (BMI) is a screening tool to identify possible weight problems. It provides an estimate of body fat based on height and weight. Your health care provider can find your BMI and can help you achieve or maintain a healthy weight. For males 20 years and older:  A BMI below 18.5 is considered underweight.  A BMI of 18.5 to 24.9 is normal.  A BMI of 25 to 29.9 is considered overweight.  A BMI of 30 and above is considered obese.  Maintain normal blood lipids and cholesterol by exercising and minimizing your intake of saturated fat. Eat a  balanced diet with plenty of fruits and vegetables. Blood tests for lipids and cholesterol should begin at age 54 and be repeated every 5 years. If your lipid or cholesterol levels are high, you are over age 48, or you are at high risk for heart disease, you may need your cholesterol levels checked more frequently.Ongoing high lipid and cholesterol levels should be treated with medicines if diet and exercise are not working.  If you smoke, find out from your health care provider how to quit. If you do not use tobacco, do not start.  Lung cancer screening is recommended for adults aged 89-80 years who are at high risk for developing lung cancer because of a history of smoking. A yearly low-dose CT scan of the lungs is recommended for people who have at least a 30-pack-year history of smoking and are current smokers or have quit within the past 15 years. A pack year of smoking is smoking an average of 1 pack of cigarettes a day for 1 year (for example, a 30-pack-year history of smoking could mean smoking 1 pack a day for 30 years or 2 packs a day for 15 years). Yearly screening should continue until the smoker has stopped smoking for at least 15 years. Yearly screening should be stopped for people who develop a health problem that would prevent them from having lung cancer treatment.  If you choose to drink alcohol, do not have more than 2 drinks per day. One drink is considered to be 12 oz (360 mL) of beer, 5 oz (150 mL) of wine,  or 1.5 oz (45 mL) of liquor.  Avoid the use of street drugs. Do not share needles with anyone. Ask for help if you need support or instructions about stopping the use of drugs.  High blood pressure causes heart disease and increases the risk of stroke. High blood pressure is more likely to develop in:  People who have blood pressure in the end of the normal range (100-139/85-89 mm Hg).  People who are overweight or obese.  People who are African American.  If you are 58-19  years of age, have your blood pressure checked every 3-5 years. If you are 78 years of age or older, have your blood pressure checked every year. You should have your blood pressure measured twice--once when you are at a hospital or clinic, and once when you are not at a hospital or clinic. Record the average of the two measurements. To check your blood pressure when you are not at a hospital or clinic, you can use:  An automated blood pressure machine at a pharmacy.  A home blood pressure monitor.  If you are 41-1 years old, ask your health care provider if you should take aspirin to prevent heart disease.  Diabetes screening involves taking a blood sample to check your fasting blood sugar level. This should be done once every 3 years after age 15 if you are at a normal weight and without risk factors for diabetes. Testing should be considered at a younger age or be carried out more frequently if you are overweight and have at least 1 risk factor for diabetes.  Colorectal cancer can be detected and often prevented. Most routine colorectal cancer screening begins at the age of 35 and continues through age 29. However, your health care provider may recommend screening at an earlier age if you have risk factors for colon cancer. On a yearly basis, your health care provider may provide home test kits to check for hidden blood in the stool. A small camera at the end of a tube may be used to directly examine the colon (sigmoidoscopy or colonoscopy) to detect the earliest forms of colorectal cancer. Talk to your health care provider about this at age 9 when routine screening begins. A direct exam of the colon should be repeated every 5-10 years through age 36, unless early forms of precancerous polyps or small growths are found.  People who are at an increased risk for hepatitis B should be screened for this virus. You are considered at high risk for hepatitis B if:  You were born in a country where  hepatitis B occurs often. Talk with your health care provider about which countries are considered high risk.  Your parents were born in a high-risk country and you have not received a shot to protect against hepatitis B (hepatitis B vaccine).  You have HIV or AIDS.  You use needles to inject street drugs.  You live with, or have sex with, someone who has hepatitis B.  You are a man who has sex with other men (MSM).  You get hemodialysis treatment.  You take certain medicines for conditions like cancer, organ transplantation, and autoimmune conditions.  Hepatitis C blood testing is recommended for all people born from [birth year] through 1965 and any individual with known risk factors for hepatitis C.  Healthy men should no longer receive prostate-specific antigen (PSA) blood tests as part of routine cancer screening. Talk to your health care provider about prostate cancer screening.  Testicular cancer screening is  not recommended for adolescents or adult males who have no symptoms. Screening includes self-exam, a health care provider exam, and other screening tests. Consult with your health care provider about any symptoms you have or any concerns you have about testicular cancer.  Practice safe sex. Use condoms and avoid high-risk sexual practices to reduce the spread of sexually transmitted infections (STIs).  You should be screened for STIs, including gonorrhea and chlamydia if:  You are sexually active and are younger than 24 years.  You are older than 24 years, and your health care provider tells you that you are at risk for this type of infection.  Your sexual activity has changed since you were last screened, and you are at an increased risk for chlamydia or gonorrhea. Ask your health care provider if you are at risk.  If you are at risk of being infected with HIV, it is recommended that you take a prescription medicine daily to prevent HIV infection. This is called pre-exposure  prophylaxis (PrEP). You are considered at risk if:  You are a man who has sex with other men (MSM).  You are a heterosexual man who is sexually active with multiple partners.  You take drugs by injection.  You are sexually active with a partner who has HIV.  Talk with your health care provider about whether you are at high risk of being infected with HIV. If you choose to begin PrEP, you should first be tested for HIV. You should then be tested every 3 months for as long as you are taking PrEP.  Use sunscreen. Apply sunscreen liberally and repeatedly throughout the day. You should seek shade when your shadow is shorter than you. Protect yourself by wearing long sleeves, pants, a wide-brimmed hat, and sunglasses year round whenever you are outdoors.  Tell your health care provider of new moles or changes in moles, especially if there is a change in shape or color. Also, tell your health care provider if a mole is larger than the size of a pencil eraser.  A one-time screening for abdominal aortic aneurysm (AAA) and surgical repair of large AAAs by ultrasound is recommended for men aged 55-75 years who are current or former smokers.  Stay current with your vaccines (immunizations).   This information is not intended to replace advice given to you by your health care provider. Make sure you discuss any questions you have with your health care provider.   Document Released: 08/18/2007 Document Revised: 03/12/2014 Document Reviewed: 07/17/2010 Elsevier Interactive Patient Education [employer].

## 2015-12-27 NOTE — Assessment & Plan Note (Signed)
Check a1c Regular exercise, low sugar/carb diet

## 2015-12-28 ENCOUNTER — Ambulatory Visit (INDEPENDENT_AMBULATORY_CARE_PROVIDER_SITE_OTHER): Payer: PPO | Admitting: Internal Medicine

## 2015-12-28 ENCOUNTER — Other Ambulatory Visit (INDEPENDENT_AMBULATORY_CARE_PROVIDER_SITE_OTHER): Payer: PPO

## 2015-12-28 ENCOUNTER — Encounter: Payer: Self-pay | Admitting: Internal Medicine

## 2015-12-28 VITALS — BP 134/82 | HR 62 | Temp 98.4°F | Resp 16 | Ht 75.0 in | Wt 209.0 lb

## 2015-12-28 DIAGNOSIS — E785 Hyperlipidemia, unspecified: Secondary | ICD-10-CM

## 2015-12-28 DIAGNOSIS — R972 Elevated prostate specific antigen [PSA]: Secondary | ICD-10-CM

## 2015-12-28 DIAGNOSIS — I10 Essential (primary) hypertension: Secondary | ICD-10-CM

## 2015-12-28 DIAGNOSIS — Z0000 Encounter for general adult medical examination without abnormal findings: Secondary | ICD-10-CM | POA: Diagnosis not present

## 2015-12-28 DIAGNOSIS — Z1159 Encounter for screening for other viral diseases: Secondary | ICD-10-CM

## 2015-12-28 DIAGNOSIS — K219 Gastro-esophageal reflux disease without esophagitis: Secondary | ICD-10-CM | POA: Diagnosis not present

## 2015-12-28 DIAGNOSIS — R7303 Prediabetes: Secondary | ICD-10-CM

## 2015-12-28 DIAGNOSIS — R011 Cardiac murmur, unspecified: Secondary | ICD-10-CM

## 2015-12-28 DIAGNOSIS — Z8679 Personal history of other diseases of the circulatory system: Secondary | ICD-10-CM

## 2015-12-28 DIAGNOSIS — N401 Enlarged prostate with lower urinary tract symptoms: Secondary | ICD-10-CM

## 2015-12-28 DIAGNOSIS — N138 Other obstructive and reflux uropathy: Secondary | ICD-10-CM

## 2015-12-28 DIAGNOSIS — J309 Allergic rhinitis, unspecified: Secondary | ICD-10-CM

## 2015-12-28 LAB — CBC WITH DIFFERENTIAL/PLATELET
Basophils Absolute: 0 10*3/uL (ref 0.0–0.1)
Basophils Relative: 0.6 % (ref 0.0–3.0)
Eosinophils Absolute: 0.1 10*3/uL (ref 0.0–0.7)
Eosinophils Relative: 2.1 % (ref 0.0–5.0)
HCT: 43.5 % (ref 39.0–52.0)
Hemoglobin: 15.2 g/dL (ref 13.0–17.0)
Lymphocytes Relative: 28.7 % (ref 12.0–46.0)
Lymphs Abs: 1.5 10*3/uL (ref 0.7–4.0)
MCHC: 34.8 g/dL (ref 30.0–36.0)
MCV: 86.4 fl (ref 78.0–100.0)
Monocytes Absolute: 0.4 10*3/uL (ref 0.1–1.0)
Monocytes Relative: 8.6 % (ref 3.0–12.0)
Neutro Abs: 3 10*3/uL (ref 1.4–7.7)
Neutrophils Relative %: 60 % (ref 43.0–77.0)
Platelets: 223 10*3/uL (ref 150.0–400.0)
RBC: 5.04 Mil/uL (ref 4.22–5.81)
RDW: 12.3 % (ref 11.5–15.5)
WBC: 5.1 10*3/uL (ref 4.0–10.5)

## 2015-12-28 LAB — LIPID PANEL
Cholesterol: 190 mg/dL (ref 0–200)
HDL: 35.1 mg/dL — ABNORMAL LOW (ref >39.00)
LDL Cholesterol: 134 mg/dL — ABNORMAL HIGH (ref 0–99)
NonHDL: 155.03
Total CHOL/HDL Ratio: 5
Triglycerides: 103 mg/dL (ref 0.0–149.0)
VLDL: 20.6 mg/dL (ref 0.0–40.0)

## 2015-12-28 LAB — COMPREHENSIVE METABOLIC PANEL
ALT: 16 U/L (ref 0–53)
AST: 16 U/L (ref 0–37)
Albumin: 4.4 g/dL (ref 3.5–5.2)
Alkaline Phosphatase: 69 U/L (ref 39–117)
BUN: 22 mg/dL (ref 6–23)
CO2: 30 mEq/L (ref 19–32)
Calcium: 9.4 mg/dL (ref 8.4–10.5)
Chloride: 104 mEq/L (ref 96–112)
Creatinine, Ser: 0.85 mg/dL (ref 0.40–1.50)
GFR: 95.71 mL/min (ref >60.00)
Glucose, Bld: 104 mg/dL — ABNORMAL HIGH (ref 70–99)
Potassium: 3.9 mEq/L (ref 3.5–5.1)
Sodium: 141 mEq/L (ref 135–145)
Total Bilirubin: 0.6 mg/dL (ref 0.2–1.2)
Total Protein: 7.5 g/dL (ref 6.0–8.3)

## 2015-12-28 LAB — HEMOGLOBIN A1C: Hgb A1c MFr Bld: 5.7 % (ref 4.6–6.5)

## 2015-12-28 LAB — TSH: TSH: 1.18 u[IU]/mL (ref 0.35–4.50)

## 2015-12-28 NOTE — Assessment & Plan Note (Signed)
Following with urology

## 2015-12-28 NOTE — Assessment & Plan Note (Signed)
Check Echo 

## 2015-12-28 NOTE — Assessment & Plan Note (Signed)
Very mild murmur - will check echo

## 2015-12-28 NOTE — Assessment & Plan Note (Signed)
Check lipid panel  

## 2015-12-28 NOTE — Progress Notes (Signed)
Pre visit review using our clinic review tool, if applicable. No additional management support is needed unless otherwise documented below in the visit note. 

## 2015-12-28 NOTE — Assessment & Plan Note (Addendum)
Following with urology psa improved

## 2015-12-28 NOTE — Assessment & Plan Note (Signed)
BP well controlled Current regimen effective and well tolerated Continue current medications at current doses cmp  

## 2015-12-28 NOTE — Assessment & Plan Note (Addendum)
continue flonase Take zyrtec or claritin prn

## 2015-12-28 NOTE — Assessment & Plan Note (Signed)
GERD controlled Continue daily medication  

## 2015-12-29 LAB — HEPATITIS C ANTIBODY: HCV Ab: NEGATIVE

## 2016-01-01 ENCOUNTER — Encounter: Payer: Self-pay | Admitting: Internal Medicine

## 2016-01-14 ENCOUNTER — Encounter: Payer: Self-pay | Admitting: Internal Medicine

## 2016-01-23 DIAGNOSIS — N486 Induration penis plastica: Secondary | ICD-10-CM | POA: Diagnosis not present

## 2016-01-23 DIAGNOSIS — R3912 Poor urinary stream: Secondary | ICD-10-CM | POA: Diagnosis not present

## 2016-01-23 DIAGNOSIS — N401 Enlarged prostate with lower urinary tract symptoms: Secondary | ICD-10-CM | POA: Diagnosis not present

## 2016-02-09 ENCOUNTER — Telehealth (HOSPITAL_COMMUNITY): Payer: Self-pay | Admitting: Internal Medicine

## 2016-02-17 NOTE — Telephone Encounter (Signed)
From 02/09/16 Called pt and lmsg for him to CB to schedule an echo.

## 2016-02-22 ENCOUNTER — Other Ambulatory Visit: Payer: Self-pay | Admitting: Internal Medicine

## 2016-02-22 ENCOUNTER — Telehealth (HOSPITAL_COMMUNITY): Payer: Self-pay | Admitting: Internal Medicine

## 2016-02-28 ENCOUNTER — Telehealth: Payer: PPO | Admitting: Family

## 2016-02-28 DIAGNOSIS — J019 Acute sinusitis, unspecified: Secondary | ICD-10-CM

## 2016-02-28 DIAGNOSIS — B9689 Other specified bacterial agents as the cause of diseases classified elsewhere: Secondary | ICD-10-CM

## 2016-02-28 MED ORDER — AMOXICILLIN-POT CLAVULANATE 875-125 MG PO TABS: 1.0000 | ORAL_TABLET | Freq: Two times a day (BID) | ORAL | 0 refills | Status: DC

## 2016-02-28 NOTE — Progress Notes (Signed)

## 2016-03-13 ENCOUNTER — Other Ambulatory Visit: Payer: Self-pay

## 2016-03-13 ENCOUNTER — Ambulatory Visit (HOSPITAL_COMMUNITY): Payer: PPO | Attending: Internal Medicine

## 2016-03-13 DIAGNOSIS — E785 Hyperlipidemia, unspecified: Secondary | ICD-10-CM | POA: Diagnosis not present

## 2016-03-13 DIAGNOSIS — Z8679 Personal history of other diseases of the circulatory system: Secondary | ICD-10-CM | POA: Diagnosis not present

## 2016-03-13 DIAGNOSIS — I358 Other nonrheumatic aortic valve disorders: Secondary | ICD-10-CM | POA: Insufficient documentation

## 2016-03-13 DIAGNOSIS — R011 Cardiac murmur, unspecified: Secondary | ICD-10-CM | POA: Diagnosis not present

## 2016-03-13 DIAGNOSIS — Z87891 Personal history of nicotine dependence: Secondary | ICD-10-CM | POA: Insufficient documentation

## 2016-03-13 DIAGNOSIS — Z8249 Family history of ischemic heart disease and other diseases of the circulatory system: Secondary | ICD-10-CM | POA: Diagnosis not present

## 2016-03-13 DIAGNOSIS — I119 Hypertensive heart disease without heart failure: Secondary | ICD-10-CM | POA: Insufficient documentation

## 2016-03-14 NOTE — Telephone Encounter (Signed)
Close encounter 

## 2016-03-15 ENCOUNTER — Encounter: Payer: Self-pay | Admitting: Internal Medicine

## 2016-04-17 ENCOUNTER — Encounter: Payer: Self-pay | Admitting: Podiatry

## 2016-04-17 ENCOUNTER — Ambulatory Visit (INDEPENDENT_AMBULATORY_CARE_PROVIDER_SITE_OTHER): Payer: PPO | Admitting: Podiatry

## 2016-04-17 VITALS — BP 126/76 | HR 91 | Ht 75.0 in | Wt 209.0 lb

## 2016-04-17 DIAGNOSIS — M205X9 Other deformities of toe(s) (acquired), unspecified foot: Secondary | ICD-10-CM | POA: Diagnosis not present

## 2016-04-17 DIAGNOSIS — M21969 Unspecified acquired deformity of unspecified lower leg: Secondary | ICD-10-CM | POA: Diagnosis not present

## 2016-04-17 DIAGNOSIS — B351 Tinea unguium: Secondary | ICD-10-CM | POA: Diagnosis not present

## 2016-04-17 NOTE — Patient Instructions (Signed)
Seen for fungal infected left great toe nail.  Noted of weakened first metatarsal bone with limited joint motion of the first MPJ left. Resulting excess pronation of the foot. Possible microtrauma to the nail plate left great toe. May benefit from Custom orthotics for faulty biomechanical foot. Left great toe fungal affected portion debrided. May use OTC antifungal medication of fungal nail. Return in 2 month for debridement.

## 2016-04-17 NOTE — Progress Notes (Signed)
SUBJECTIVE: 66 y.o. year old male presents with problem toe nails.  REVIEW OF SYSTEMS: Pertinent items noted in HPI and remainder of comprehensive ROS otherwise negative.  OBJECTIVE: DERMATOLOGIC EXAMINATION: Nails: discolored and dystrophic hallucal nail at distal end. No other skin lesions noted.   VASCULAR EXAMINATION OF LOWER LIMBS: All pedal pulses are palpable with normal pulsation.  No edema or erythema noted.  NEUROLOGIC EXAMINATION OF THE LOWER LIMBS: Achilles DTR is present and within normal. Monofilament (Semmes-Weinstein 10-gm) sensory testing positive 6 out of 6, bilateral. Vibratory sensations(128Hz  turning fork) intact at medial and lateral forefoot bilateral.  Sharp and Dull discriminatory sensations at the plantar ball of hallux is intact bilateral.   MUSCULOSKELETAL EXAMINATION: Positive for excess motion of the first metatarsal bone bilateral.  Limited dorsiflexion of the first MPJ with forefoot loading bilateral.  ASSESSMENT: Onychomycosis both great toes. Hallux limitus bilateral. Metatarsal deformity  PLAN: Reviewed clinical findings and available treatment options. May benefit form custom orthotics. Use OTC antifungal medication. Return in 2 weeks for debridement.

## 2016-05-25 ENCOUNTER — Telehealth: Payer: Self-pay | Admitting: Internal Medicine

## 2016-05-25 NOTE — Telephone Encounter (Signed)
Please advise 

## 2016-05-25 NOTE — Telephone Encounter (Signed)
Pt stated that stated that he received a letter about some study that Dr burns suggested he do?  Do you know anything about this ?

## 2016-05-26 NOTE — Telephone Encounter (Signed)
I am not sure what he is referring to

## 2016-05-29 NOTE — Telephone Encounter (Signed)
Spoke with pt, he believes that it was a Quarry manager from [employer].

## 2016-06-13 ENCOUNTER — Telehealth: Payer: Self-pay | Admitting: General Practice

## 2016-06-13 NOTE — Telephone Encounter (Signed)
I called to confirm that Dr. [name] is patient's new PCP since Dr. Linna Darner retired, but there was no answer. Charles Tate ([place])

## 2016-06-19 ENCOUNTER — Ambulatory Visit: Payer: PPO | Admitting: Podiatry

## 2016-06-26 ENCOUNTER — Ambulatory Visit (INDEPENDENT_AMBULATORY_CARE_PROVIDER_SITE_OTHER): Payer: PPO | Admitting: Podiatry

## 2016-06-26 ENCOUNTER — Encounter: Payer: Self-pay | Admitting: Podiatry

## 2016-06-26 DIAGNOSIS — L600 Ingrowing nail: Secondary | ICD-10-CM | POA: Diagnosis not present

## 2016-06-26 DIAGNOSIS — B351 Tinea unguium: Secondary | ICD-10-CM

## 2016-06-26 DIAGNOSIS — M205X9 Other deformities of toe(s) (acquired), unspecified foot: Secondary | ICD-10-CM

## 2016-06-26 DIAGNOSIS — M21969 Unspecified acquired deformity of unspecified lower leg: Secondary | ICD-10-CM | POA: Diagnosis not present

## 2016-06-26 NOTE — Patient Instructions (Signed)
Follow up on left great toe nail. Noted of improved nail but with potential ingrown nail problem. Continue with current level of care and palliation. Return as needed.

## 2016-06-26 NOTE — Progress Notes (Signed)
SUBJECTIVE: 66 y.o. year old male presents with problem toe nails.  REVIEW OF SYSTEMS: Pertinent items noted in HPI and remainder of comprehensive ROS otherwise negative.  OBJECTIVE: DERMATOLOGIC EXAMINATION: Discolored and dystrophic hallucal nail medial border at distal end left great toe. No other skin lesions noted.   VASCULAR EXAMINATION OF LOWER LIMBS: All pedal pulses are palpable with normal pulsation.  No edema or erythema noted.  NEUROLOGIC EXAMINATION OF THE LOWER LIMBS: Achilles DTR is present and within normal. Monofilament (Semmes-Weinstein 10-gm) sensory testing positive 6 out of 6, bilateral. Vibratory sensations(128Hz  turning fork) intact at medial and lateral forefoot bilateral.  Sharp and Dull discriminatory sensations at the plantar ball of hallux is intact bilateral.   MUSCULOSKELETAL EXAMINATION: Positive for excess motion of the first metatarsal bone bilateral.  Limited dorsiflexion of the first MPJ with forefoot loading bilateral.  ASSESSMENT: Onychomycosis with ingrown nail left great toe medial border. Hallux limitus bilateral. Metatarsal deformity  PLAN: Reviewed clinical findings and available treatment options. All nails debrided. May benefit form custom orthotics. Continue with OTC antifungal medication.  Return as needed.

## 2016-09-06 ENCOUNTER — Telehealth: Payer: Self-pay | Admitting: Internal Medicine

## 2016-09-06 DIAGNOSIS — Z1211 Encounter for screening for malignant neoplasm of colon: Secondary | ICD-10-CM

## 2016-09-06 NOTE — Telephone Encounter (Signed)
Call pt no answer LMOM RTC.../lmb 

## 2016-09-06 NOTE — Telephone Encounter (Signed)
Due for a prevnar - ok to schedule on nurse schedule.   Due for colonoscopy given his family history of colon cancer - I can put in a referral -  Is he ok with going upstairs to [facility]?

## 2016-09-06 NOTE — Telephone Encounter (Signed)
Pt called stating that he is due for a colonoscopy and wanted to know if orders could be put in for this. He also mentioned that he may be due for a pneumonia injection. He said that this was mentioned at his last office visit. Would it be okay to make an appointment for this on the nurse schedule?

## 2016-09-11 NOTE — Addendum Note (Signed)
Addended by: [name] on: 09/11/2016 12:24 PM   Modules accepted: Orders

## 2016-09-11 NOTE — Telephone Encounter (Signed)
Called pt inform him of MD response below. Pt states he is going to wait to get the prevenar he is due for annual appt in Oct have some additional questions he want to discuss w/MD before getting. He also states that he doesn't have a preference on where to go for the colonoscopy our [facility] is fine. Inform pt once MD place referral he will be contact by their office to set up appt...Charles Tate

## 2016-09-18 DIAGNOSIS — L821 Other seborrheic keratosis: Secondary | ICD-10-CM | POA: Diagnosis not present

## 2016-09-18 DIAGNOSIS — L578 Other skin changes due to chronic exposure to nonionizing radiation: Secondary | ICD-10-CM | POA: Diagnosis not present

## 2016-09-18 DIAGNOSIS — L820 Inflamed seborrheic keratosis: Secondary | ICD-10-CM | POA: Diagnosis not present

## 2016-09-18 DIAGNOSIS — L918 Other hypertrophic disorders of the skin: Secondary | ICD-10-CM | POA: Diagnosis not present

## 2016-09-18 DIAGNOSIS — D1801 Hemangioma of skin and subcutaneous tissue: Secondary | ICD-10-CM | POA: Diagnosis not present

## 2016-10-11 ENCOUNTER — Other Ambulatory Visit: Payer: Self-pay | Admitting: Internal Medicine

## 2016-11-01 ENCOUNTER — Encounter: Payer: Self-pay | Admitting: Family Medicine

## 2016-11-01 ENCOUNTER — Ambulatory Visit (INDEPENDENT_AMBULATORY_CARE_PROVIDER_SITE_OTHER): Payer: PPO | Admitting: Family Medicine

## 2016-11-01 VITALS — BP 140/80 | HR 74 | Temp 98.7°F | Ht 75.0 in | Wt 210.0 lb

## 2016-11-01 DIAGNOSIS — J069 Acute upper respiratory infection, unspecified: Secondary | ICD-10-CM | POA: Diagnosis not present

## 2016-11-01 DIAGNOSIS — J45909 Unspecified asthma, uncomplicated: Secondary | ICD-10-CM

## 2016-11-01 MED ORDER — FLUTICASONE PROPIONATE 50 MCG/ACT NA SUSP: 2.0000 | Freq: Every day | NASAL | 5 refills | Status: DC

## 2016-11-01 MED ORDER — ALBUTEROL SULFATE HFA 108 (90 BASE) MCG/ACT IN AERS: 2.0000 | INHALATION_SPRAY | Freq: Four times a day (QID) | RESPIRATORY_TRACT | 2 refills | Status: DC | PRN

## 2016-11-01 NOTE — Assessment & Plan Note (Signed)
His symptoms seem be consistent with a viral infection. Does not appear to be bacterial.  - Advised supportive care - Given indications to return.

## 2016-11-01 NOTE — Patient Instructions (Addendum)
Thank you for coming in,   Please try things such as zyrtec-D or allegra-D which is an antihistamine and decongestant.   Please try afrin which will help with nasal congestion but use for only three days.   Please also try using a netti pot on a regular occasion.  Honey can help with a sore throat.      Please feel free to call with any questions or concerns at any time, at [phone]. --Dr. Schmitz  

## 2016-11-01 NOTE — Progress Notes (Signed)
Charles Tate - 66 y.o. male MRN [id number]  Date of birth: 10-13-1950  SUBJECTIVE:  Including CC & ROS.  Chief Complaint  Patient presents with  . Cough    patient thinks he may have allergies but is not sure having sinus drainage for about a month claims chest was tight and hurting and took mucinex which helped    Charles Tate is a 66 year old male that is presenting with a cough. He reports these symptoms are been intermittent for the past month. He has had some drainage and mucus production that is clear. He works with [employer] and he feels like the symptoms of gotten worse over the past 2 weeks. He has tried over-the-counter Mucinex for the past day. He denies any fevers or chills. He reports that he has a history of seasonal allergies. He has been taking Flonase as well. He also takes albuterol to help with his breathing.     Review of Systems  Constitutional: Negative for fever.  HENT: Positive for postnasal drip. Negative for trouble swallowing.   Respiratory: Positive for cough. Negative for shortness of breath.   Cardiovascular: Negative for chest pain.  Musculoskeletal: Negative for gait problem.  Allergic/Immunologic: Positive for environmental allergies.  Neurological: Negative for headaches.    HISTORY: Past Medical, Surgical, Social, and Family History Reviewed & Updated per EMR.   Pertinent Historical Findings include:  Past Medical History:  Diagnosis Date  . GERD (gastroesophageal reflux disease)   . Hyperlipidemia   . Hypertension   . RAD (reactive airway disease)    with RTIs    Past Surgical History:  Procedure Laterality Date  . colonoscopy with polypectomy  2013   Dr Ferdinand Lango, [facility]  . WISDOM TOOTH EXTRACTION      No Known Allergies  Family History  Problem Relation Age of Onset  . Arthritis Mother   . Hyperlipidemia Mother   . Lupus Mother   . Heart attack Father 67  . Hypertension Sister   . Heart attack Paternal Grandmother 59  .  Diabetes Paternal Aunt   . Stroke Maternal Grandfather 80  . Cancer Paternal Grandfather        ? stomach; in 52s     Social History   Social History  . Marital status: Married    Spouse name: N/A  . Number of children: N/A  . Years of education: N/A   Occupational History  . Not on file.   Social History Main Topics  . Smoking status: Former Research scientist (life sciences)  . Smokeless tobacco: Never Used     Comment: intermittent, short term smoker as a teen. Some second hand smoke in 20s & 30s  . Alcohol use Yes     Comment:   very rarely  . Drug use: No  . Sexual activity: Not on file   Other Topics Concern  . Not on file   Social History Narrative   Exercise: none regularly     PHYSICAL EXAM:  VS: BP 140/80 (BP Location: Left Arm, Patient Position: Sitting, Cuff Size: Normal)   Pulse 74   Temp 98.7 F (37.1 C) (Oral)   Ht 6\' 3"  (1.905 m)   Wt 210 lb (95.3 kg)   SpO2 99%   BMI 26.25 kg/m  Physical Exam Gen: NAD, alert, cooperative with exam, well-appearing ENT: normal lips, normal nasal mucosa, tympanic membranes clear and intact bilaterally, no cervical lymphadenopathy, turbinates swollen, no tonsillar exudates, Eye: normal EOM, normal conjunctiva and lids CV:  no edema, +  2 pedal pulses, regular rate and rhythm, S1-S2   Resp: no accessory muscle use, non-labored, clear to auscultation bilaterally, no crackles or wheezes Skin: no rashes, no areas of induration  Neuro: normal tone, normal sensation to touch Psych:  normal insight, alert and oriented MSK: Normal strength, normal gait      ASSESSMENT & PLAN:   Asthma Refill his albuterol today.  Viral URI His symptoms seem be consistent with a viral infection. Does not appear to be bacterial.  - Advised supportive care - Given indications to return.

## 2016-11-01 NOTE — Assessment & Plan Note (Signed)
Refill his albuterol today.

## 2016-11-06 ENCOUNTER — Encounter: Payer: Self-pay | Admitting: Internal Medicine

## 2016-11-06 ENCOUNTER — Ambulatory Visit (INDEPENDENT_AMBULATORY_CARE_PROVIDER_SITE_OTHER): Payer: PPO | Admitting: Internal Medicine

## 2016-11-06 ENCOUNTER — Other Ambulatory Visit: Payer: PPO

## 2016-11-06 ENCOUNTER — Telehealth: Payer: Self-pay | Admitting: Internal Medicine

## 2016-11-06 ENCOUNTER — Ambulatory Visit (INDEPENDENT_AMBULATORY_CARE_PROVIDER_SITE_OTHER)
Admission: RE | Admit: 2016-11-06 | Discharge: 2016-11-06 | Disposition: A | Discharge status: Home / Self Care | Payer: PPO | Source: Ambulatory Visit | Attending: Internal Medicine | Admitting: Internal Medicine

## 2016-11-06 VITALS — BP 110/76 | HR 91 | Temp 97.8°F | Resp 18 | Wt 206.0 lb

## 2016-11-06 DIAGNOSIS — J209 Acute bronchitis, unspecified: Secondary | ICD-10-CM | POA: Diagnosis not present

## 2016-11-06 DIAGNOSIS — J4541 Moderate persistent asthma with (acute) exacerbation: Secondary | ICD-10-CM | POA: Diagnosis not present

## 2016-11-06 DIAGNOSIS — R300 Dysuria: Secondary | ICD-10-CM

## 2016-11-06 DIAGNOSIS — R05 Cough: Secondary | ICD-10-CM | POA: Diagnosis not present

## 2016-11-06 LAB — POCT URINALYSIS DIPSTICK
Bilirubin, UA: NEGATIVE
Blood, UA: NEGATIVE
Glucose, UA: NEGATIVE
Ketones, UA: NEGATIVE
Leukocytes, UA: NEGATIVE
Nitrite, UA: NEGATIVE
Protein, UA: NEGATIVE
Spec Grav, UA: 1.02 (ref 1.010–1.025)
Urobilinogen, UA: 0.2 E.U./dL
pH, UA: 6 (ref 5.0–8.0)

## 2016-11-06 MED ORDER — METHYLPREDNISOLONE 4 MG PO TBPK: ORAL_TABLET | ORAL | 0 refills | Status: DC

## 2016-11-06 MED ORDER — CEFDINIR 300 MG PO CAPS: 300.0000 mg | ORAL_CAPSULE | Freq: Two times a day (BID) | ORAL | 0 refills | Status: DC

## 2016-11-06 NOTE — Assessment & Plan Note (Signed)
Asthma exac due to bacterial bronchitis cxr to r/o PNA Omnicef, medrol dose pak Albuterol Q 4 hrs prn otc cold meds Call if no improvement

## 2016-11-06 NOTE — Assessment & Plan Note (Signed)
Symptoms consistent with bronchitis - will get cxr to r/o PNA omnicef x 10 days otc meds discussed - will d/c claritin - D - can take plain Claritin Call if no improvement

## 2016-11-06 NOTE — Telephone Encounter (Signed)
Pt called in said he is not any better and it has went down in his chest.  He feels worse and now blowing out yellow and green mucus.  He would like antibiotic called in   Pharmacy - [facility] [address]

## 2016-11-06 NOTE — Progress Notes (Signed)
Subjective:    Patient ID: Charles Tate, male    DOB: 1950-09-08, 66 y.o.   MRN: [id number]  HPI He is here for an acute visit.   He was seen on 8/30 for a viral URI.  It was advised he take zyrtec-D or allegra-D, afrin.   His symptoms started 3 weeks ago.  He thought it was allergies - he had a lot of drainage and sinus pressures associated with teeth pain. Last week the cold started moving into his chest.  He was cough, wheezing.  He has been taking allergy medication and Robitussin.   He has a prod cough with yellow phlegm.  He has lightheadedness and has "balcked out " a couple of times when he has stood up.    He is using nasal saline.  Claritin - D, advil and Robitussin DM.    Urinary changes:  The past 2-3 nights he has had to go and has not been able to .  He has had some dysuria and frequent urination at night.  Last night it was twice but was not that bad.  He has had a left flank pain when urinating at times.    Medications and allergies reviewed with patient and updated if appropriate.  Patient Active Problem List   Diagnosis Date Noted  . Viral URI 11/01/2016  . Allergic rhinitis 12/28/2015  . H/O: rheumatic fever 12/28/2015  . Undiagnosed cardiac murmurs 12/28/2015  . Prediabetes 12/27/2015  . Rising PSA level 02/21/2014  . History of colon polyps 02/17/2014  . 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 Hyperlipidemia 01/07/2009  . Asthma 06/04/2007  . GERD 06/04/2007  . Essential hypertension 09/26/2006    Current Outpatient Prescriptions on File Prior to Visit  Medication Sig Dispense Refill  . albuterol (PROVENTIL HFA;VENTOLIN HFA) 108 (90 Base) MCG/ACT inhaler Inhale 2 puffs into the lungs every 6 (six) hours as needed for wheezing or shortness of breath. 1 Inhaler 2  . fluticasone (FLONASE) 50 MCG/ACT nasal spray Place 2 sprays into both nostrils daily. --- Office visit needed for further refills 16 g 5  .  losartan-hydrochlorothiazide (HYZAAR) 100-12.5 MG tablet TAKE 1 TABLET BY MOUTH DAILY 90 tablet 2  . Multiple Vitamin (MULTIVITAMIN) capsule Take 1 capsule by mouth daily.      . ranitidine (ZANTAC) 150 MG tablet TAKE 1 TABLET BY MOUTH EVERY 12 HOURS 180 tablet 2   No current facility-administered medications on file prior to visit.     Past Medical History:  Diagnosis Date  . GERD (gastroesophageal reflux disease)   . Hyperlipidemia   . Hypertension   . RAD (reactive airway disease)    with RTIs    Past Surgical History:  Procedure Laterality Date  . colonoscopy with polypectomy  2013   Dr Ferdinand Lango, [facility]  . WISDOM TOOTH EXTRACTION      Social History   Social History  . Marital status: Married    Spouse name: N/A  . Number of children: N/A  . Years of education: N/A   Social History Main Topics  . Smoking status: Former Research scientist (life sciences)  . Smokeless tobacco: Never Used     Comment: intermittent, short term smoker as a teen. Some second hand smoke in 20s & 30s  . Alcohol use Yes     Comment:   very rarely  . Drug use: No  . Sexual activity: Not Asked   Other Topics Concern  . None   Social History  Narrative   Exercise: none regularly    Family History  Problem Relation Age of Onset  . Arthritis Mother   . Hyperlipidemia Mother   . Lupus Mother   . Heart attack Father 13  . Hypertension Sister   . Heart attack Paternal Grandmother 79  . Diabetes Paternal Aunt   . Stroke Maternal Grandfather 40  . Cancer Paternal Grandfather        ? stomach; in 42s    Review of Systems  Constitutional: Negative for chills and fever.  HENT: Positive for congestion, postnasal drip and sinus pressure. Negative for ear pain and sore throat.   Respiratory: Positive for cough, chest tightness, shortness of breath and wheezing.   Cardiovascular: Negative for chest pain.  Gastrointestinal: Negative for diarrhea and nausea.  Genitourinary: Positive for difficulty urinating, dysuria,  flank pain and frequency. Negative for hematuria.  Musculoskeletal: Positive for myalgias.  Neurological: Positive for light-headedness (with standing). Negative for headaches.       Objective:   Vitals:   11/06/16 1325  BP: 110/76  Pulse: 91  Resp: 18  Temp: 97.8 F (36.6 C)  SpO2: 98%   Filed Weights   11/06/16 1325  Weight: 206 lb (93.4 kg)   Body mass index is 25.75 kg/m.  Wt Readings from Last 3 Encounters:  11/06/16 206 lb (93.4 kg)  11/01/16 210 lb (95.3 kg)  04/17/16 209 lb (94.8 kg)     Physical Exam GENERAL APPEARANCE: Appears stated age, well appearing, NAD EYES: conjunctiva clear, no icterus HEENT: bilateral tympanic membranes and ear canals normal, oropharynx with mild erythema, no thyromegaly, trachea midline, no cervical or supraclavicular lymphadenopathy LUNGS: unlabored breathing, good air entry bilaterally, mild wheeze on exam.  No crackles. HEART: Normal S1,S2 without murmurs EXTREMITIES: Without clubbing, cyanosis, or edema        Assessment & Plan:   See Problem List for Assessment and Plan of chronic medical problems.

## 2016-11-06 NOTE — Patient Instructions (Signed)
Take the antibiotic and steroid as prescribed.  Use the albuterol inhaler every 4 hrs as needed.  Stop the claritin- D.  Continue the other over-the-counter cold medications.   Have a chest x-ray today downstairs.  We will call with the results.    We will culture your urine and make sure there is no infection.

## 2016-11-06 NOTE — Assessment & Plan Note (Signed)
With difficulty urinating - improved since stopping the Claritin D yesterday Due to pseudoephedrine with his BPH Avoid above  Can resume nasal spray Urine dip w/o infection - will send for culture

## 2016-11-07 ENCOUNTER — Encounter: Payer: Self-pay | Admitting: Internal Medicine

## 2016-11-07 ENCOUNTER — Telehealth: Payer: Self-pay | Admitting: Internal Medicine

## 2016-11-07 LAB — URINE CULTURE: Organism ID, Bacteria: NO GROWTH

## 2016-11-07 NOTE — Telephone Encounter (Signed)
ROI fax to [facility]

## 2016-11-08 ENCOUNTER — Telehealth: Payer: Self-pay | Admitting: Internal Medicine

## 2016-11-08 NOTE — Telephone Encounter (Signed)
Records were just received today and referral was placed 09/26/16 in the afternoon.

## 2016-11-13 ENCOUNTER — Encounter: Payer: Self-pay | Admitting: Internal Medicine

## 2016-11-13 NOTE — Telephone Encounter (Signed)
Dr. Hilarie Fredrickson reviewed records and has accepted patient. Ok to schedule Direct Colon. 2 day prep, prep listed as inadequate. Left message for patient to return my call.

## 2016-11-15 ENCOUNTER — Telehealth: Payer: Self-pay | Admitting: Internal Medicine

## 2016-11-15 MED ORDER — HYOSCYAMINE SULFATE SL 0.125 MG SL SUBL: 0.1250 mg | SUBLINGUAL_TABLET | Freq: Four times a day (QID) | SUBLINGUAL | 0 refills | Status: DC | PRN

## 2016-11-15 NOTE — Telephone Encounter (Signed)
Pt informed of MD response, states Tylenol susbsides the symptoms, he will drinks lots of fluids, and he would like the Rx for cramps called in. He will call back regarding the diarrhea and he was informed of office closing tomorrow at 12.  Please advise

## 2016-11-15 NOTE — Telephone Encounter (Signed)
Stop the antibiotic.  Continue tylenol.  Lots of fluids.  I can prescribe something for the stomach cramping if he wants.    I would expect the symptoms to resolve quickly after stopping the antibiotic, but mostly it is just symptomatic treatment.  If the diarrhea does not improve he needs to let us know.

## 2016-11-15 NOTE — Telephone Encounter (Signed)
Pt states he has had a reaction to cefdinir (OMNICEF) 300 MG capsule  Fever, chills, stomach cramps and diarrhea, he states he has had reactions to antibiotics like this before, he would like to know if something can be prescribed or if there is something he can take over the counter. Please advise and call back  he has taken tylenol for his fever

## 2016-11-19 ENCOUNTER — Other Ambulatory Visit: Payer: Self-pay | Admitting: Internal Medicine

## 2016-12-01 DIAGNOSIS — J3089 Other allergic rhinitis: Secondary | ICD-10-CM | POA: Diagnosis not present

## 2016-12-01 DIAGNOSIS — R399 Unspecified symptoms and signs involving the genitourinary system: Secondary | ICD-10-CM | POA: Diagnosis not present

## 2016-12-01 DIAGNOSIS — N419 Inflammatory disease of prostate, unspecified: Secondary | ICD-10-CM | POA: Diagnosis not present

## 2016-12-05 ENCOUNTER — Telehealth: Payer: Self-pay | Admitting: Internal Medicine

## 2016-12-05 NOTE — Telephone Encounter (Signed)
Ideally he should come in for reassessment. Typically prostatitis is treated for 6 weeks. The Cipro can be changed to a different antibiotic

## 2016-12-05 NOTE — Telephone Encounter (Signed)
[facility] - Client [facility] Patient Name: Charles Tate DOB: [birth year] Initial Comment Caller has questions about a abx he's taking. Is feeling extreme lethargy and tired, he's also dizzy. Cipro Nurse Assessment Nurse: Martyn Ehrich, RN, Felicia Date/Time (Eastern Time): 12/05/2016 4:03:52 PM Confirm and document reason for call. If symptomatic, describe symptoms. ---PT is on Cipro and he is fatigued and dizzy. He came in the end of Aug. early Sept and saw a young male MD that was filling in. (Sports med MD) He got claritin D and nasal spray and Burns gave him cefdinir (sinus cough) a week later and it affected his stomach. Last week he had bad issues urinating and he went to [facility] and he gave cipro and flomax. Prostatitis diagnosis and gave him nasal spray again and he has been zonked out. He has had a lot of fatigue. No fever Does the patient have any new or worsening symptoms? ---Yes Will a triage be completed? ---Yes Related visit to physician within the last 2 weeks? ---Yes Does the PT have any chronic conditions? (i.e. diabetes, asthma, etc.) ---Yes List chronic conditions. ---uses albuterol at times - one MD had asthma on chart. Is this a behavioral health or substance abuse call? ---No Guidelines Guideline Title Affirmed Question Affirmed Notes Asthma Attack [1] Wheezing or coughing AND [2] hasn't used neb or inhaler twice AND [3] it's available Weakness (Generalized) and Fatigue Difficulty breathing Final Disposition User Go to ED Now Martyn Ehrich, RN, [place] Comments he is still congested some - tickle and wheeze that comes and goes - on albuterol not having to hold onto things to walk urine speciman said no infection - but prostate exam hurt so he said the infection was in prostate - - urination improves. urination is NL today - no blood in urine. NO lower back pain. No tea colored urine - no fever  and no burning or pain on urination weakness started Sat - he feels it is related to CIpro - heart pounding and limbs are sore caller feels his weakness is related to Cipro and he wants to stop it - but he got it at [facility] on Sat. Not from [place]. He declined to go to ER. At times when he stoops down and tries to Comments stand up he feels he may faint but has not. This afternoon walked fine to the mailbox just now. Told him someone will call him back bc he is not going to ER - but cant guarantee it will be today Lovena Le MA for Dr. [name] said she will let MD know and f/u with the pt his wheezing is intermittent but he has it now - talking in long sentences. Did tell him that this MD cant address medication over the phone that was ordered by a different practice Referrals [place] UNDECIDED [place] REFUSED Caller Disagree/Comply Comply Caller Understands Yes PreDisposition Did not know what to do Call Id: [id number]

## 2016-12-06 NOTE — Progress Notes (Signed)
Subjective:    Patient ID: Charles Tate, male    DOB: December 11, 1950, 66 y.o.   MRN: [id number]  HPI The patient is here for follow up.  His 30 year old dog died 11-03-2022.  He picked up wool rugs from someone's attic and wonders if there was mold on them.  Him and his son both got sick just after this.    11/01/16:  He saw Dr Raeford Razor for cold symptoms and was diagnosed with a viral URI and advised supportive care.   11/06/16:  He saw me and I diagnosed him with bronchitis and prescribed omnicef, a medrol dose pak, and albuterol. His CXR was negative. He did complain of some difficulty urinating but it had gotten better since stopping the Claritin D.  His urine studies did not show an infection.    11/15/16:  He had a reaction to omnicef - fever, chills, stomach cramps and diarrhea.  He has reacted to antibiotics in the past the same way.  He stopped the antibiotic.  Last week he had difficulty urinating and went to urgent care.  He had dysuria and difficulting urinating.  His urine had no infection, but his prostate was tender and he was diagnosed with prostitis.  He was prescribed Cipro BID x 14 days. He was also prescribed flomax.  He had several symptoms and felt he was having a side effect from the cipro.  He took it for 4 full days.    Earlier this week he had difficulty walking up the stairs.  He started having hip and joint pain, right calf/knee pain.  His joints were cracking and popping.  His feet were itching.  His toes were numb and tingling.  After moving around it got better.  Tylenol helped with the aches.  After stopping the Cipro the symptoms improved.    He is starting to get some dysuria again.   He is not having difficulty urinating.  He denies fever /chills.  He denies abdominal pain.    He continues to have his cold symptoms.  He states nasal congestion, PND, sinus pressure, cough and wheezing from the PND.  He denies fever, sore throat, SOB and headaches.  He thinks his  symptoms are getting a little better.   Elevated BP:  His blood pressure is elevated today and is always well controlled.  He wonders if his BP is elevated because he is not feeling well.  He is taking his medication daily.    Medications and allergies reviewed with patient and updated if appropriate.  Patient Active Problem List   Diagnosis Date Noted  . Dysuria 11/06/2016  . Acute bronchitis 11/06/2016  . Moderate persistent asthma with exacerbation 11/06/2016  . Allergic rhinitis 12/28/2015  . H/O: rheumatic fever 12/28/2015  . Undiagnosed cardiac murmurs 12/28/2015  . Prediabetes 12/27/2015  . Rising PSA level 02/21/2014  . History of colon polyps 02/17/2014  . 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 Hyperlipidemia 01/07/2009  . Asthma 06/04/2007  . GERD 06/04/2007  . Essential hypertension 09/26/2006    Current Outpatient Prescriptions on File Prior to Visit  Medication Sig Dispense Refill  . albuterol (PROVENTIL HFA;VENTOLIN HFA) 108 (90 Base) MCG/ACT inhaler Inhale 2 puffs into the lungs every 6 (six) hours as needed for wheezing or shortness of breath. 1 Inhaler 2  . fluticasone (FLONASE) 50 MCG/ACT nasal spray Place 2 sprays into both nostrils daily. --- Office visit needed for further refills 16 g 5  .  Hyoscyamine Sulfate SL (LEVSIN/SL) 0.125 MG SUBL Place 0.125 mg under the tongue 4 (four) times daily as needed. 20 each 0  . losartan-hydrochlorothiazide (HYZAAR) 100-12.5 MG tablet Take 1 tablet by mouth daily. -- Office visit needed for further refills 30 tablet 0  . methylPREDNISolone (MEDROL DOSEPAK) 4 MG TBPK tablet 24 mg PO on day 1, then decr. by 4 mg/day x5 days 21 tablet 0  . Multiple Vitamin (MULTIVITAMIN) capsule Take 1 capsule by mouth daily.      . ranitidine (ZANTAC) 150 MG tablet TAKE 1 TABLET BY MOUTH EVERY 12 HOURS 180 tablet 2   No current facility-administered medications on file prior to visit.     Past Medical History:    Diagnosis Date  . GERD (gastroesophageal reflux disease)   . Hyperlipidemia   . Hypertension   . RAD (reactive airway disease)    with RTIs    Past Surgical History:  Procedure Laterality Date  . colonoscopy with polypectomy  2013   Dr Ferdinand Lango, [facility]  . WISDOM TOOTH EXTRACTION      Social History   Social History  . Marital status: Married    Spouse name: N/A  . Number of children: N/A  . Years of education: N/A   Social History Main Topics  . Smoking status: Former Research scientist (life sciences)  . Smokeless tobacco: Never Used     Comment: intermittent, short term smoker as a teen. Some second hand smoke in 20s & 30s  . Alcohol use Yes     Comment:   very rarely  . Drug use: No  . Sexual activity: Not on file   Other Topics Concern  . Not on file   Social History Narrative   Exercise: none regularly    Family History  Problem Relation Age of Onset  . Arthritis Mother   . Hyperlipidemia Mother   . Lupus Mother   . Heart attack Father 84  . Hypertension Sister   . Heart attack Paternal Grandmother 64  . Diabetes Paternal Aunt   . Stroke Maternal Grandfather 61  . Cancer Paternal Grandfather        ? stomach; in 11s    Review of Systems  Constitutional: Negative for chills, fatigue and fever.  HENT: Positive for congestion, postnasal drip and sinus pressure. Negative for sinus pain and sore throat.   Respiratory: Positive for cough (from PND) and wheezing (PND). Negative for shortness of breath.   Gastrointestinal: Negative for abdominal pain and nausea.  Genitourinary: Positive for dysuria. Negative for difficulty urinating and hematuria.  Neurological: Negative for light-headedness and headaches.  Psychiatric/Behavioral:       Feels a little out of it / foggy       Objective:   Vitals:   12/07/16 1008  BP: (!) 152/84  Pulse: 76  Resp: 16  Temp: 97.6 F (36.4 C)  SpO2: 97%   Wt Readings from Last 3 Encounters:  12/07/16 212 lb (96.2 kg)  11/06/16 206 lb  (93.4 kg)  11/01/16 210 lb (95.3 kg)   Body mass index is 26.5 kg/m.   Physical Exam    GENERAL APPEARANCE: Appears stated age, well appearing, NAD EYES: conjunctiva clear, no icterus HEENT: bilateral tympanic membranes and ear canals normal, oropharynx with mild erythema, no thyromegaly, trachea midline, no cervical or supraclavicular lymphadenopathy LUNGS: Clear to auscultation without wheeze or crackles, unlabored breathing, good air entry bilaterally HEART: Normal S1,S2 without murmurs GU: deferred prostate exam EXTREMITIES: Without clubbing, cyanosis, or edema  Assessment & Plan:    See Problem List for Assessment and Plan of chronic medical problems.

## 2016-12-06 NOTE — Telephone Encounter (Signed)
noted 

## 2016-12-06 NOTE — Telephone Encounter (Signed)
Spoke with pt states he woke up this morning with Ankles and feet itching. He did not take the antibiotic last night. He was given Cipro 500 mg 2 xs daily. 14 supply. Started taking Saturday. He contact [facility] and is waiting for a call back from them, he was okay with seeing Dr [name] tomorrow.

## 2016-12-07 ENCOUNTER — Encounter: Payer: Self-pay | Admitting: Internal Medicine

## 2016-12-07 ENCOUNTER — Ambulatory Visit (INDEPENDENT_AMBULATORY_CARE_PROVIDER_SITE_OTHER): Payer: PPO | Admitting: Internal Medicine

## 2016-12-07 VITALS — BP 152/84 | HR 76 | Temp 97.6°F | Resp 16 | Wt 212.0 lb

## 2016-12-07 DIAGNOSIS — J0100 Acute maxillary sinusitis, unspecified: Secondary | ICD-10-CM | POA: Diagnosis not present

## 2016-12-07 DIAGNOSIS — I10 Essential (primary) hypertension: Secondary | ICD-10-CM | POA: Diagnosis not present

## 2016-12-07 DIAGNOSIS — N410 Acute prostatitis: Secondary | ICD-10-CM | POA: Diagnosis not present

## 2016-12-07 MED ORDER — SULFAMETHOXAZOLE-TRIMETHOPRIM 800-160 MG PO TABS: 1.0000 | ORAL_TABLET | Freq: Two times a day (BID) | ORAL | 0 refills | Status: DC

## 2016-12-07 MED ORDER — LOSARTAN POTASSIUM-HCTZ 100-12.5 MG PO TABS: 1.0000 | ORAL_TABLET | Freq: Every day | ORAL | 1 refills | Status: DC

## 2016-12-07 NOTE — Addendum Note (Signed)
Addended by: Terence Lux B on: 12/07/2016 12:47 PM   Modules accepted: Orders

## 2016-12-07 NOTE — Assessment & Plan Note (Signed)
Completed 4 days of cipro, stopped early due to side effects Now getting dysuria again, no difficulty urinating due to taking flomax Concern for partially treated prostatis Start bactrim BID x 10 days Has f/u with urology in Dec  - may need to move up if infection does not resolve Continue flomax Call if no improvement

## 2016-12-07 NOTE — Assessment & Plan Note (Signed)
Elevated BP here today, usually controlled ? Elevation related to him not feeling well Continue current medication He will monitor his BP at home closely and call if it is persistently elevated

## 2016-12-07 NOTE — Assessment & Plan Note (Signed)
Sinus infection is slowly improving Will continue saline nasal spray and other otc cold meds that do not affect BP Will be taking bactrim for prostate infection which will help sinus infection if bacterial

## 2016-12-07 NOTE — Patient Instructions (Addendum)
Take the bactrim as prescribed.  If your symptoms are not improving please call.    Treat your cold symptoms with over the counter cold medications that do not elevate your blood pressure.  The Bactrim will also help.    If your symptoms worsen or fail to improve, please contact our office for further instruction, or in case of emergency go directly to the emergency room at the closest medical facility.   General Recommendations:    Please drink plenty of fluids.  Get plenty of rest   Sleep in humidified air  Use saline nasal sprays  Netti pot  OTC Medications:  Decongestants - helps relieve congestion   Flonase (generic fluticasone) or Nasacort (generic triamcinolone) - please make sure to use the "cross-over" technique at a 45 degree angle towards the opposite eye as opposed to straight up the nasal passageway.   Sudafed (generic pseudoephedrine - Note this is the one that is available behind the pharmacy counter); Products with phenylephrine (-PE) may also be used but is often not as effective as pseudoephedrine.   If you have HIGH BLOOD PRESSURE - Coricidin HBP; AVOID any product that is -D as this contains pseudoephedrine which may increase your blood pressure.  Afrin (oxymetazoline) every 6-8 hours for up to 3 days.  Allergies - helps relieve runny nose, itchy eyes and sneezing   Claritin (generic loratidine), Allegra (fexofenidine), or Zyrtec (generic cyrterizine) for runny nose. These medications should not cause drowsiness.  Note - Benadryl (generic diphenhydramine) may be used however may cause drowsiness  Cough -   Delsym or Robitussin (generic dextromethorphan)  Expectorants - helps loosen mucus to ease removal   Mucinex (generic guaifenesin) as directed on the package.  Headaches / General Aches   Tylenol (generic acetaminophen) - DO NOT EXCEED 3 grams (3,000 mg) in a 24 hour time period  Advil/Motrin (generic ibuprofen)  Sore Throat  -   Salt water gargle   Chloraseptic (generic benzocaine) spray or lozenges / Sucrets (generic dyclonine)

## 2017-01-01 ENCOUNTER — Ambulatory Visit (AMBULATORY_SURGERY_CENTER): Payer: Self-pay | Admitting: *Deleted

## 2017-01-01 ENCOUNTER — Encounter: Payer: Self-pay | Admitting: Internal Medicine

## 2017-01-01 VITALS — Ht 75.0 in | Wt 210.0 lb

## 2017-01-01 DIAGNOSIS — Z86010 Personal history of colonic polyps: Secondary | ICD-10-CM

## 2017-01-01 MED ORDER — NA SULFATE-K SULFATE-MG SULF 17.5-3.13-1.6 GM/177ML PO SOLN: 1.0000 | Freq: Once | ORAL | 0 refills | Status: AC

## 2017-01-01 NOTE — Progress Notes (Signed)
No egg or soy allergy known to patient  No issues with past sedation with any surgeries  or procedures, no  Past intubation problems  No diet pills per patient No home 02 use per patient  No blood thinners per patient  Pt denies issues with constipation  No A fib or A flutter  EMMI video sent to pt's e mail - pt declined  Pt given a pay no more than $50 coupon for prep

## 2017-01-14 ENCOUNTER — Telehealth: Payer: Self-pay | Admitting: Internal Medicine

## 2017-01-15 ENCOUNTER — Encounter: Payer: PPO | Admitting: Internal Medicine

## 2017-01-31 ENCOUNTER — Encounter: Payer: PPO | Admitting: Internal Medicine

## 2017-02-04 DIAGNOSIS — N401 Enlarged prostate with lower urinary tract symptoms: Secondary | ICD-10-CM | POA: Diagnosis not present

## 2017-02-04 DIAGNOSIS — R350 Frequency of micturition: Secondary | ICD-10-CM | POA: Diagnosis not present

## 2017-02-05 ENCOUNTER — Other Ambulatory Visit: Payer: Self-pay | Admitting: Internal Medicine

## 2017-03-12 DIAGNOSIS — R3912 Poor urinary stream: Secondary | ICD-10-CM | POA: Diagnosis not present

## 2017-03-12 DIAGNOSIS — N401 Enlarged prostate with lower urinary tract symptoms: Secondary | ICD-10-CM | POA: Diagnosis not present

## 2017-05-06 ENCOUNTER — Ambulatory Visit (INDEPENDENT_AMBULATORY_CARE_PROVIDER_SITE_OTHER): Payer: Medicare HMO | Admitting: Internal Medicine

## 2017-05-06 ENCOUNTER — Encounter: Payer: Self-pay | Admitting: Internal Medicine

## 2017-05-06 VITALS — BP 130/84 | HR 88 | Temp 98.7°F | Resp 16 | Wt 210.0 lb

## 2017-05-06 DIAGNOSIS — I10 Essential (primary) hypertension: Secondary | ICD-10-CM | POA: Diagnosis not present

## 2017-05-06 DIAGNOSIS — J111 Influenza due to unidentified influenza virus with other respiratory manifestations: Secondary | ICD-10-CM

## 2017-05-06 MED ORDER — HYDROCOD POLST-CPM POLST ER 10-8 MG/5ML PO SUER: 5.0000 mL | Freq: Two times a day (BID) | ORAL | 0 refills | Status: DC | PRN

## 2017-05-06 MED ORDER — OSELTAMIVIR PHOSPHATE 75 MG PO CAPS: 75.0000 mg | ORAL_CAPSULE | Freq: Two times a day (BID) | ORAL | 0 refills | Status: DC

## 2017-05-06 MED ORDER — OLMESARTAN MEDOXOMIL-HCTZ 40-12.5 MG PO TABS: 1.0000 | ORAL_TABLET | Freq: Every day | ORAL | 5 refills | Status: DC

## 2017-05-06 NOTE — Progress Notes (Signed)
Subjective:    Patient ID: Charles Tate, male    DOB: 23-Dec-1950, 67 y.o.   MRN: [id number]  HPI The patient is here for an acute visit.  He is here for an acute visit for cold symptoms.  His symptoms started 3-4  days ago.   He is experiencing fatigue, fever, chills, nasal congestion, postnasal drip, sneezing, tightness in his chest cough, wheezing and body aches.  He has had some headaches as well.  He has tried taking dayquil, nyquil, mucinex.  He is also used his inhaler, which has helped.  He feels slightly better today than yesterday.    Medications and allergies reviewed with patient and updated if appropriate.  Patient Active Problem List   Diagnosis Date Noted  . Acute prostatitis 12/07/2016  . Subacute maxillary sinusitis 12/07/2016  . Dysuria 11/06/2016  . Moderate persistent asthma with exacerbation 11/06/2016  . Allergic rhinitis 12/28/2015  . H/O: rheumatic fever 12/28/2015  . Undiagnosed cardiac murmurs 12/28/2015  . Prediabetes 12/27/2015  . Rising PSA level 02/21/2014  . History of colon polyps 02/17/2014  . 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 Hyperlipidemia 01/07/2009  . Asthma 06/04/2007  . GERD 06/04/2007  . Essential hypertension 09/26/2006    Current Outpatient Medications on File Prior to Visit  Medication Sig Dispense Refill  . albuterol (PROVENTIL HFA;VENTOLIN HFA) 108 (90 Base) MCG/ACT inhaler Inhale 2 puffs into the lungs every 6 (six) hours as needed for wheezing or shortness of breath. 1 Inhaler 2  . bisacodyl (DULCOLAX) 5 MG EC tablet Take 5 mg by mouth once. For colon 11-13 x 4    . fluticasone (FLONASE) 50 MCG/ACT nasal spray Place 2 sprays into both nostrils daily. --- Office visit needed for further refills 16 g 5  . Hyoscyamine Sulfate SL (LEVSIN/SL) 0.125 MG SUBL Place 0.125 mg under the tongue 4 (four) times daily as needed. 20 each 0  . losartan-hydrochlorothiazide (HYZAAR) 100-12.5 MG tablet Take 1  tablet by mouth daily. 90 tablet 1  . Multiple Vitamin (MULTIVITAMIN) capsule Take 1 capsule by mouth daily.      . polyethylene glycol powder (MIRALAX) powder Take 1 Container by mouth once. 119 grams for a 2 day colon prep for 11-13    . ranitidine (ZANTAC) 150 MG tablet Take 1 tablet (150 mg total) by mouth every 12 (twelve) hours. -- due for physical for further refills 180 tablet 0   No current facility-administered medications on file prior to visit.     Past Medical History:  Diagnosis Date  . Allergy   . Arthritis    left hip   . GERD (gastroesophageal reflux disease)   . Heart murmur    adolescent rheumatic fever - developed murmur   . Hyperlipidemia   . Hypertension   . RAD (reactive airway disease)    with RTIs    Past Surgical History:  Procedure Laterality Date  . COLONOSCOPY    . colonoscopy with polypectomy  2013   Dr Ferdinand Lango, [facility]  . POLYPECTOMY    . WISDOM TOOTH EXTRACTION      Social History   Socioeconomic History  . Marital status: Married    Spouse name: None  . Number of children: None  . Years of education: None  . Highest education level: None  Social Needs  . Financial resource strain: None  . Food insecurity - worry: None  . Food insecurity - inability: None  . Transportation needs - medical:  None  . Transportation needs - non-medical: None  Occupational History  . None  Tobacco Use  . Smoking status: Former Research scientist (life sciences)  . Smokeless tobacco: Never Used  . Tobacco comment: intermittent, short term smoker as a teen. Some second hand smoke in 20s & 30s  Substance and Sexual Activity  . Alcohol use: Yes    Comment:   very rarely  . Drug use: No  . Sexual activity: None  Other Topics Concern  . None  Social History Narrative   Exercise: none regularly    Family History  Problem Relation Age of Onset  . Arthritis Mother   . Hyperlipidemia Mother   . Lupus Mother   . Heart attack Father 44  . Hypertension Sister   . Heart attack  Paternal Grandmother 73  . Diabetes Paternal Aunt   . Stroke Maternal Grandfather 14  . Cancer Paternal Grandfather        ? stomach; in 80s  . Colon cancer Paternal Grandfather   . Colon polyps Neg Hx   . Esophageal cancer Neg Hx   . Rectal cancer Neg Hx   . Stomach cancer Neg Hx     Review of Systems  Constitutional: Positive for chills, fatigue and fever.  HENT: Positive for congestion, postnasal drip and sneezing. Negative for ear pain, sinus pain and sore throat.   Respiratory: Positive for cough (dry), chest tightness and wheezing. Negative for shortness of breath.   Musculoskeletal: Positive for myalgias.  Neurological: Positive for headaches.       Objective:   Vitals:   05/06/17 1542  BP: 130/84  Pulse: 88  Resp: 16  Temp: 98.7 F (37.1 C)  SpO2: 96%   Wt Readings from Last 3 Encounters:  05/06/17 210 lb (95.3 kg)  01/01/17 210 lb (95.3 kg)  12/07/16 212 lb (96.2 kg)   Body mass index is 26.25 kg/m.   Physical Exam    GENERAL APPEARANCE: Mildly ill appearing, NAD EYES: conjunctiva clear, no icterus HEENT: bilateral tympanic membranes and ear canals normal, oropharynx with no erythema, no thyromegaly, trachea midline, no cervical or supraclavicular lymphadenopathy LUNGS: Clear to auscultation without wheeze or crackles, unlabored breathing, good air entry bilaterally CARDIOVASCULAR: Normal S1,S2 without murmurs, no edema SKIN: Warm, dry      Assessment & Plan:    See Problem List for Assessment and Plan of chronic medical problems.

## 2017-05-06 NOTE — Assessment & Plan Note (Signed)
He is concerned about the recall of losartan We will discontinue losartan-hydrochlorothiazide Start Benicar-hydrochlorothiazide 40-12.5 mg daily Ideally advised him to monitor blood pressure at home with a new medication to make sure his blood pressure is controlled

## 2017-05-06 NOTE — Patient Instructions (Addendum)
You do have the flu.  Take the tamiflu twice daily for 5 days. Use the cough syrup at night.  Symptomatic treatment with advil, tylenol and over the counter cold medications  Rest, fluids  Call if no improvement    We will change your BP medication to olmesartan-hctz.      Influenza, Adult Influenza, more commonly known as "the flu," is a viral infection that primarily affects the respiratory tract. The respiratory tract includes organs that help you breathe, such as the lungs, nose, and throat. The flu causes many common cold symptoms, as well as a high fever and body aches. The flu spreads easily from person to person (is contagious). Getting a flu shot (influenza vaccination) every year is the best way to prevent influenza. What are the causes? Influenza is caused by a virus. You can catch the virus by:  Breathing in droplets from an infected person's cough or sneeze.  Touching something that was recently contaminated with the virus and then touching your mouth, nose, or eyes.  What increases the risk? The following factors may make you more likely to get the flu:  Not cleaning your hands frequently with soap and water or alcohol-based hand sanitizer.  Having close contact with many people during cold and flu season.  Touching your mouth, eyes, or nose without washing or sanitizing your hands first.  Not drinking enough fluids or not eating a healthy diet.  Not getting enough sleep or exercise.  Being under a high amount of stress.  Not getting a yearly (annual) flu shot.  You may be at a higher risk of complications from the flu, such as a severe lung infection (pneumonia), if you:  Are over the age of 53.  Are pregnant.  Have a weakened disease-fighting system (immune system). You may have a weakened immune system if you: ? Have HIV or AIDS. ? Are undergoing chemotherapy. ? Aretaking medicines that reduce the activity of (suppress) the immune system.  Have a  long-term (chronic) illness, such as heart disease, kidney disease, diabetes, or lung disease.  Have a liver disorder.  Are obese.  Have anemia.  What are the signs or symptoms? Symptoms of this condition typically last 4-10 days and may include:  Fever.  Chills.  Headache, body aches, or muscle aches.  Sore throat.  Cough.  Runny or congested nose.  Chest discomfort and cough.  Poor appetite.  Weakness or tiredness (fatigue).  Dizziness.  Nausea or vomiting.  How is this diagnosed? This condition may be diagnosed based on your medical history and a physical exam. Your health care provider may do a nose or throat swab test to confirm the diagnosis. How is this treated? If influenza is detected early, you can be treated with antiviral medicine that can reduce the length of your illness and the severity of your symptoms. This medicine may be given by mouth (orally) or through an IV tube that is inserted in one of your veins. The goal of treatment is to relieve symptoms by taking care of yourself at home. This may include taking over-the-counter medicines, drinking plenty of fluids, and adding humidity to the air in your home. In some cases, influenza goes away on its own. Severe influenza or complications from influenza may be treated in a hospital. Follow these instructions at home:  Take over-the-counter and prescription medicines only as told by your health care provider.  Use a cool mist humidifier to add humidity to the air in your home. This  can make breathing easier.  Rest as needed.  Drink enough fluid to keep your urine clear or pale yellow.  Cover your mouth and nose when you cough or sneeze.  Wash your hands with soap and water often, especially after you cough or sneeze. If soap and water are not available, use hand sanitizer.  Stay home from work or school as told by your health care provider. Unless you are visiting your health care provider, try to  avoid leaving home until your fever has been gone for 24 hours without the use of medicine.  Keep all follow-up visits as told by your health care provider. This is important. How is this prevented?  Getting an annual flu shot is the best way to avoid getting the flu. You may get the flu shot in late summer, fall, or winter. Ask your health care provider when you should get your flu shot.  Wash your hands often or use hand sanitizer often.  Avoid contact with people who are sick during cold and flu season.  Eat a healthy diet, drink plenty of fluids, get enough sleep, and exercise regularly. Contact a health care provider if:  You develop new symptoms.  You have: ? Chest pain. ? Diarrhea. ? A fever.  Your cough gets worse.  You produce more mucus.  You feel nauseous or you vomit. Get help right away if:  You develop shortness of breath or difficulty breathing.  Your skin or nails turn a bluish color.  You have severe pain or stiffness in your neck.  You develop a sudden headache or sudden pain in your face or ear.  You cannot stop vomiting. This information is not intended to replace advice given to you by your health care provider. Make sure you discuss any questions you have with your health care provider. Document Released: 02/17/2000 Document Revised: 07/28/2015 Document Reviewed: 12/14/2014 Elsevier Interactive Patient Education  2017 [employer].

## 2017-05-06 NOTE — Assessment & Plan Note (Signed)
Rapid flu test positive Start Tamiflu 75 mg twice daily times 5 days Increase rest and fluids Cough syrup for nighttime Continue inhaler and over-the-counter cold medications for symptom relief His lungs are clear and it does not sound like he has a secondary bacterial infection-he will let me know if his symptoms do not continue to improve or change

## 2017-05-07 ENCOUNTER — Encounter: Payer: Self-pay | Admitting: Internal Medicine

## 2017-05-09 MED ORDER — IRBESARTAN-HYDROCHLOROTHIAZIDE 150-12.5 MG PO TABS: 1.0000 | ORAL_TABLET | Freq: Every day | ORAL | 5 refills | Status: DC

## 2017-05-18 ENCOUNTER — Other Ambulatory Visit: Payer: Self-pay | Admitting: Internal Medicine

## 2017-05-20 ENCOUNTER — Encounter: Payer: Self-pay | Admitting: Internal Medicine

## 2017-05-20 ENCOUNTER — Telehealth: Payer: Self-pay | Admitting: Internal Medicine

## 2017-05-20 NOTE — Telephone Encounter (Signed)
Copied from [place]. Topic: Quick Communication - Rx Refill/Question >> May 20, 2017  4:55 PM Oliver Pila B wrote: Medication:  irbesartan-hydrochlorothiazide (AVALIDE) 150-12.5 MG tablet [[id number]]  Pt states that he's not been feeling well but pt thinks that his bp is running high, pt is wondering if he needs to adjust his dosage, contact pt to advise

## 2017-05-21 NOTE — Telephone Encounter (Signed)
Patient called, left VM to call the office to schedule an appointment for his BP issues.

## 2017-05-21 NOTE — Telephone Encounter (Signed)
Patient returned call, I asked about his morning BP reading, he says "I have a machine that gives the average and it was 149/81 from yesterday's readings to this morning's reading. I just feel drained, maybe my allergies. I can come in for a visit." Appointment made for tomorrow at 80 with Dr. [name].

## 2017-05-21 NOTE — Telephone Encounter (Signed)
This encounter was created in error - please disregard.

## 2017-05-21 NOTE — Progress Notes (Signed)
Subjective:    Patient ID: Charles Tate, male    DOB: [birth year], 67 y.o.   MRN: [id number]  HPI The patient is here for an acute visit.  His BP medication, was recalled.    145/80, 145/84, 154/81,138/76, 138/75, 139/75, 131/72, 138/79, 148/76, 149/94  Not feeling well   Medications and allergies reviewed with patient and updated if appropriate.  Patient Active Problem List   Diagnosis Date Noted  . Influenza 05/06/2017  . Acute prostatitis 12/07/2016  . Dysuria 11/06/2016  . Moderate persistent asthma with exacerbation 11/06/2016  . Allergic rhinitis 12/28/2015  . H/O: rheumatic fever 12/28/2015  . Undiagnosed cardiac murmurs 12/28/2015  . Prediabetes 12/27/2015  . Rising PSA level 02/21/2014  . History of colon polyps 02/17/2014  . 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 Hyperlipidemia 01/07/2009  . Asthma 06/04/2007  . GERD 06/04/2007  . Essential hypertension 09/26/2006    Current Outpatient Medications on File Prior to Visit  Medication Sig Dispense Refill  . albuterol (PROVENTIL HFA;VENTOLIN HFA) 108 (90 Base) MCG/ACT inhaler Inhale 2 puffs into the lungs every 6 (six) hours as needed for wheezing or shortness of breath. 1 Inhaler 2  . bisacodyl (DULCOLAX) 5 MG EC tablet Take 5 mg by mouth once. For colon 11-13 x 4    . fluticasone (FLONASE) 50 MCG/ACT nasal spray Place 2 sprays into both nostrils daily. --- Office visit needed for further refills 16 g 5  . Hyoscyamine Sulfate SL (LEVSIN/SL) 0.125 MG SUBL Place 0.125 mg under the tongue 4 (four) times daily as needed. 20 each 0  . irbesartan-hydrochlorothiazide (AVALIDE) 150-12.5 MG tablet Take 1 tablet by mouth daily. 30 tablet 5  . Multiple Vitamin (MULTIVITAMIN) capsule Take 1 capsule by mouth daily.      . polyethylene glycol powder (MIRALAX) powder Take 1 Container by mouth once. 119 grams for a 2 day colon prep for 11-13    . ranitidine (ZANTAC) 150 MG tablet Take 1 tablet (150  mg total) by mouth every 12 (twelve) hours. -- Office visit needed for further refills 60 tablet 0   No current facility-administered medications on file prior to visit.     Past Medical History:  Diagnosis Date  . Allergy   . Arthritis    left hip   . GERD (gastroesophageal reflux disease)   . Heart murmur    adolescent rheumatic fever - developed murmur   . Hyperlipidemia   . Hypertension   . RAD (reactive airway disease)    with RTIs    Past Surgical History:  Procedure Laterality Date  . COLONOSCOPY    . colonoscopy with polypectomy  2013   Dr Ferdinand Lango, [facility]  . POLYPECTOMY    . WISDOM TOOTH EXTRACTION      Social History   Socioeconomic History  . Marital status: Married    Spouse name: Not on file  . Number of children: Not on file  . Years of education: Not on file  . Highest education level: Not on file  Social Needs  . Financial resource strain: Not on file  . Food insecurity - worry: Not on file  . Food insecurity - inability: Not on file  . Transportation needs - medical: Not on file  . Transportation needs - non-medical: Not on file  Occupational History  . Not on file  Tobacco Use  . Smoking status: Former Research scientist (life sciences)  . Smokeless tobacco: Never Used  . Tobacco comment: intermittent, short term  smoker as a teen. Some second hand smoke in 20s & 30s  Substance and Sexual Activity  . Alcohol use: Yes    Comment:   very rarely  . Drug use: No  . Sexual activity: Not on file  Other Topics Concern  . Not on file  Social History Narrative   Exercise: none regularly    Family History  Problem Relation Age of Onset  . Arthritis Mother   . Hyperlipidemia Mother   . Lupus Mother   . Heart attack Father 30  . Hypertension Sister   . Heart attack Paternal Grandmother 70  . Diabetes Paternal Aunt   . Stroke Maternal Grandfather 96  . Cancer Paternal Grandfather        ? stomach; in 46s  . Colon cancer Paternal Grandfather   . Colon polyps Neg Hx     . Esophageal cancer Neg Hx   . Rectal cancer Neg Hx   . Stomach cancer Neg Hx     Review of Systems  Constitutional: Negative for chills and fever.  HENT: Positive for congestion (allergy related).   Respiratory: Negative for shortness of breath.   Cardiovascular: Negative for chest pain, palpitations and leg swelling.  Neurological: Positive for headaches. Negative for dizziness and light-headedness.       Objective:   Vitals:   05/22/17 1042  BP: (!) 142/90  Pulse: 61  Resp: 16  Temp: 98.3 F (36.8 C)  SpO2: 98%   Wt Readings from Last 3 Encounters:  05/22/17 214 lb (97.1 kg)  05/06/17 210 lb (95.3 kg)  01/01/17 210 lb (95.3 kg)   Body mass index is 26.75 kg/m.   Physical Exam    Constitutional: Appears well-developed and well-nourished. No distress.  HENT:  Head: Normocephalic and atraumatic.  Neck: Neck supple. No tracheal deviation present. No thyromegaly present.  No cervical lymphadenopathy Cardiovascular: Normal rate, regular rhythm and normal heart sounds.   No murmur heard. No carotid bruit .  No edema Pulmonary/Chest: Effort normal and breath sounds normal. No respiratory distress. No has no wheezes. No rales.  Skin: Skin is warm and dry. Not diaphoretic.  Psychiatric: Normal mood and affect. Behavior is normal.      Assessment & Plan:    See Problem List for Assessment and Plan of chronic medical problems.

## 2017-05-22 ENCOUNTER — Encounter: Payer: Self-pay | Admitting: Internal Medicine

## 2017-05-22 ENCOUNTER — Ambulatory Visit (INDEPENDENT_AMBULATORY_CARE_PROVIDER_SITE_OTHER): Payer: Medicare HMO | Admitting: Internal Medicine

## 2017-05-22 DIAGNOSIS — I10 Essential (primary) hypertension: Secondary | ICD-10-CM | POA: Diagnosis not present

## 2017-05-22 MED ORDER — TELMISARTAN-HCTZ 80-25 MG PO TABS: 1.0000 | ORAL_TABLET | Freq: Every day | ORAL | 5 refills | Status: DC

## 2017-05-22 NOTE — Patient Instructions (Addendum)
  Medications reviewed and updated.  Changes include stopping the irbesartan-hctz and starting telmisartan-hctz daily.   Monitoring your BP at home with a goal of < 140/90.   Your prescription(s) have been submitted to your pharmacy. Please take as directed and contact our office if you believe you are having problem(s) with the medication(s).    Please followup for a physical in the next few months

## 2017-05-22 NOTE — Assessment & Plan Note (Signed)
Not ideally controlled and concern over recall of irbesartan Will d/c irbesartan-hctz Start micardis hct 80-25 mg daily Not exercising - will start Low sodium diet stressed Monitor at home - goal less than 140/90

## 2017-05-31 ENCOUNTER — Encounter: Payer: Self-pay | Admitting: Internal Medicine

## 2017-05-31 ENCOUNTER — Telehealth: Payer: Self-pay | Admitting: Internal Medicine

## 2017-05-31 MED ORDER — NEBIVOLOL HCL 5 MG PO TABS: 5.0000 mg | ORAL_TABLET | Freq: Every day | ORAL | 5 refills | Status: DC

## 2017-05-31 NOTE — Telephone Encounter (Signed)
LVM informing pt, RX sent to [facility] advised pt to call back if too expensive.

## 2017-05-31 NOTE — Telephone Encounter (Signed)
Lets add a medication if not too expensive - bystolic 5 mg daily --- rx pending.

## 2017-05-31 NOTE — Telephone Encounter (Signed)
Copied from [place] [phone]. Topic: Quick Communication - See Telephone Encounter >> May 31, 2017  9:07 AM Synthia Innocent wrote: CRM for notification. See Telephone encounter for: 05/31/17. Patient calling wanting to let Dr [name] know he's BP readings. Yesterday  149/87, today 154/83 and 158/80.

## 2017-06-16 ENCOUNTER — Other Ambulatory Visit: Payer: Self-pay | Admitting: Internal Medicine

## 2017-06-19 ENCOUNTER — Other Ambulatory Visit: Payer: Self-pay

## 2017-06-19 ENCOUNTER — Ambulatory Visit (INDEPENDENT_AMBULATORY_CARE_PROVIDER_SITE_OTHER): Payer: Medicare HMO | Admitting: Physician Assistant

## 2017-06-19 ENCOUNTER — Encounter: Payer: Self-pay | Admitting: Physician Assistant

## 2017-06-19 VITALS — BP 118/72 | HR 74 | Temp 98.2°F | Resp 16 | Ht 75.0 in | Wt 213.0 lb

## 2017-06-19 DIAGNOSIS — I10 Essential (primary) hypertension: Secondary | ICD-10-CM

## 2017-06-19 DIAGNOSIS — J0130 Acute sphenoidal sinusitis, unspecified: Secondary | ICD-10-CM | POA: Diagnosis not present

## 2017-06-19 MED ORDER — AZELASTINE HCL 0.1 % NA SOLN: 1.0000 | Freq: Two times a day (BID) | NASAL | 12 refills | Status: DC

## 2017-06-19 MED ORDER — FLUTICASONE PROPIONATE 50 MCG/ACT NA SUSP: 2.0000 | Freq: Every day | NASAL | 6 refills | Status: DC

## 2017-06-19 MED ORDER — DOXYCYCLINE HYCLATE 100 MG PO CAPS: 100.0000 mg | ORAL_CAPSULE | Freq: Two times a day (BID) | ORAL | 0 refills | Status: DC

## 2017-06-19 NOTE — Progress Notes (Signed)
Patient presents to clinic today c/o sinus pressure and nasal congestion/ear pressure over the past couple of days. Has a history of sinusitis but also has history of hypertension and notes his medications were changed a month ago. Has noted frontal headache off and on for a couple of days. Denies fever, chills, chest pain, racing heart, lightheadedness or dizziness. Has checked BP a couple of times at home with his old machine and noted some mildly elevated BPs. States he is not sure if he is checking correctly as he usually checks without resting for 5-10 minutes prior.   Past Medical History:  Diagnosis Date  . Allergy   . Arthritis    left hip   . GERD (gastroesophageal reflux disease)   . Heart murmur    adolescent rheumatic fever - developed murmur   . Hyperlipidemia   . Hypertension   . RAD (reactive airway disease)    with RTIs    Current Outpatient Medications on File Prior to Visit  Medication Sig Dispense Refill  . albuterol (PROVENTIL HFA;VENTOLIN HFA) 108 (90 Base) MCG/ACT inhaler Inhale 2 puffs into the lungs every 6 (six) hours as needed for wheezing or shortness of breath. 1 Inhaler 2  . Multiple Vitamin (MULTIVITAMIN) capsule Take 1 capsule by mouth daily.      . ranitidine (ZANTAC) 150 MG tablet Take 1 tablet (150 mg total) by mouth 2 (two) times daily. 60 tablet 0  . telmisartan-hydrochlorothiazide (MICARDIS HCT) 80-25 MG tablet Take 1 tablet by mouth daily. 30 tablet 5   No current facility-administered medications on file prior to visit.     Allergies  Allergen Reactions  . Ciprofloxacin Hcl     Numbness, joint pain  . Cefdinir Other (See Comments)    Fever, chills, stomach cramps and diarrhea  . Lisinopril Cough    Family History  Problem Relation Age of Onset  . Arthritis Mother   . Hyperlipidemia Mother   . Lupus Mother   . Heart attack Father 84  . Hypertension Sister   . Heart attack Paternal Grandmother 8  . Diabetes Paternal Aunt   . Stroke  Maternal Grandfather 63  . Cancer Paternal Grandfather        ? stomach; in 35s  . Colon cancer Paternal Grandfather   . Colon polyps Neg Hx   . Esophageal cancer Neg Hx   . Rectal cancer Neg Hx   . Stomach cancer Neg Hx     Social History   Socioeconomic History  . Marital status: Married    Spouse name: Not on file  . Number of children: Not on file  . Years of education: Not on file  . Highest education level: Not on file  Occupational History  . Not on file  Social Needs  . Financial resource strain: Not on file  . Food insecurity:    Worry: Not on file    Inability: Not on file  . Transportation needs:    Medical: Not on file    Non-medical: Not on file  Tobacco Use  . Smoking status: Former Research scientist (life sciences)  . Smokeless tobacco: Never Used  . Tobacco comment: intermittent, short term smoker as a teen. Some second hand smoke in 20s & 30s  Substance and Sexual Activity  . Alcohol use: Yes    Comment:   very rarely  . Drug use: No  . Sexual activity: Not on file  Lifestyle  . Physical activity:    Days per week: Not on  file    Minutes per session: Not on file  . Stress: Not on file  Relationships  . Social connections:    Talks on phone: Not on file    Gets together: Not on file    Attends religious service: Not on file    Active member of club or organization: Not on file    Attends meetings of clubs or organizations: Not on file    Relationship status: Not on file  Other Topics Concern  . Not on file  Social History Narrative   Exercise: none regularly   Review of Systems - See HPI.  All other ROS are negative.  BP 118/72   Pulse 74   Temp 98.2 F (36.8 C) (Oral)   Resp 16   Ht 6\' 3"  (1.905 m)   Wt 213 lb (96.6 kg)   SpO2 98%   BMI 26.62 kg/m   Physical Exam  Constitutional: He appears well-developed and well-nourished.  HENT:  Head: Normocephalic and atraumatic.  Right Ear: Tympanic membrane normal.  Left Ear: Tympanic membrane normal.  Nose:  Mucosal edema and rhinorrhea present. Right sinus exhibits frontal sinus tenderness. Right sinus exhibits no maxillary sinus tenderness. Left sinus exhibits frontal sinus tenderness. Left sinus exhibits no maxillary sinus tenderness.  Mouth/Throat: Uvula is midline, oropharynx is clear and moist and mucous membranes are normal.  Eyes: Conjunctivae are normal.  Cardiovascular: Normal rate, regular rhythm, normal heart sounds and intact distal pulses.  Neurological: He is alert.  Skin: Skin is warm.  Vitals reviewed.  Assessment/Plan: 1. Acute non-recurrent sphenoidal sinusitis Suspect viral but is early in course of a sinusitis so hard to tell.Very mild TTP of frontal sinuses. Start Flonase and Astelin. Increase fluids. Rx printed for Doxycycline to start if symptoms are not improving over the next few days as this would be concerning for a bacterial sinusitis.  - fluticasone (FLONASE) 50 MCG/ACT nasal spray; Place 2 sprays into both nostrils daily.  Dispense: 16 g; Refill: 6 - azelastine (ASTELIN) 0.1 % nasal spray; Place 1 spray into both nostrils 2 (two) times daily. Use in each nostril as directed  Dispense: 30 mL; Refill: 12  2. Essential hypertension BP looks great in office today. Reviewed the proper way to check BP at home. He is to do this daily and follow-up with his PCP regarding readings.    Leeanne Rio, PA-C

## 2017-06-19 NOTE — Patient Instructions (Signed)
Please check your BP as directed below:  - Make sure you have rested in a comfortable seat, in a quiet setting, for at least 5-10 minutes.  - Make sure to elevate your arm to heart level while wearing the cuff.  - Write down BP measurements for a few days and send these to Dr. [name] for further adjustments.   BP look good today.   For sinus inflammation, please keep hydrated. Start a saline nasal spray daily. Use the Astelin as directed along with OTC Flonase instead of current spray. Prescriptions have been sent. If there is any worsening of sinus symptoms over the weekend, start the antibiotic and take as directed.

## 2017-06-27 ENCOUNTER — Telehealth: Payer: Self-pay | Admitting: Emergency Medicine

## 2017-06-27 NOTE — Telephone Encounter (Signed)
Called patient to schedule AWV. Patient declined at this time. 

## 2017-06-30 NOTE — Patient Instructions (Addendum)
Charles Tate , Thank you for taking time to come for your Medicare Wellness Visit. I appreciate your ongoing commitment to your health goals. Please review the following plan we discussed and let me know if I can assist you in the future.   These are the goals we discussed: Goals    Start regular exercise when able      This is a list of the screening recommended for you and due dates:  Health Maintenance  Topic Date Due  . Pneumonia vaccines (1 of 2 - PCV13) Given today  . Colon Cancer Screening  10/03/2016  . Flu Shot  10/03/2017  . Tetanus Vaccine  05/03/2020  .  Hepatitis C: One time screening is recommended by Center for Disease Control  (CDC) for  adults born from [birth year] through 1966.   Completed    Test(s) ordered today. Your results will be released to [place] (or called to you) after review, usually within 72hours after test completion. If any changes need to be made, you will be notified at that same time.  All other Health Maintenance issues reviewed.   All recommended immunizations and age-appropriate screenings are up-to-date or discussed.  prevnar immunization administered today.   Medications reviewed and updated.  No changes recommended at this time.   Please followup in 6 months    Health Maintenance, Male A healthy lifestyle and preventive care is important for your health and wellness. Ask your health care provider about what schedule of regular examinations is right for you. What should I know about weight and diet? Eat a Healthy Diet  Eat plenty of vegetables, fruits, whole grains, low-fat dairy products, and lean protein.  Do not eat a lot of foods high in solid fats, added sugars, or salt.  Maintain a Healthy Weight Regular exercise can help you achieve or maintain a healthy weight. You should:  Do at least 150 minutes of exercise each week. The exercise should increase your heart rate and make you sweat (moderate-intensity exercise).  Do  strength-training exercises at least twice a week.  Watch Your Levels of Cholesterol and Blood Lipids  Have your blood tested for lipids and cholesterol every 5 years starting at 68 years of age. If you are at high risk for heart disease, you should start having your blood tested when you are 68 years old. You may need to have your cholesterol levels checked more often if: ? Your lipid or cholesterol levels are high. ? You are older than 68 years of age. ? You are at high risk for heart disease.  What should I know about cancer screening? Many types of cancers can be detected early and may often be prevented. Lung Cancer  You should be screened every year for lung cancer if: ? You are a current smoker who has smoked for at least 30 years. ? You are a former smoker who has quit within the past 15 years.  Talk to your health care provider about your screening options, when you should start screening, and how often you should be screened.  Colorectal Cancer  Routine colorectal cancer screening usually begins at 68 years of age and should be repeated every 5-10 years until you are 68 years old. You may need to be screened more often if early forms of precancerous polyps or small growths are found. Your health care provider may recommend screening at an earlier age if you have risk factors for colon cancer.  Your health care provider may recommend using  home test kits to check for hidden blood in the stool.  A small camera at the end of a tube can be used to examine your colon (sigmoidoscopy or colonoscopy). This checks for the earliest forms of colorectal cancer.  Prostate and Testicular Cancer  Depending on your age and overall health, your health care provider may do certain tests to screen for prostate and testicular cancer.  Talk to your health care provider about any symptoms or concerns you have about testicular or prostate cancer.  Skin Cancer  Check your skin from head to toe  regularly.  Tell your health care provider about any new moles or changes in moles, especially if: ? There is a change in a mole's size, shape, or color. ? You have a mole that is larger than a pencil eraser.  Always use sunscreen. Apply sunscreen liberally and repeat throughout the day.  Protect yourself by wearing long sleeves, pants, a wide-brimmed hat, and sunglasses when outside.  What should I know about heart disease, diabetes, and high blood pressure?  If you are 5-40 years of age, have your blood pressure checked every 3-5 years. If you are 21 years of age or older, have your blood pressure checked every year. You should have your blood pressure measured twice-once when you are at a hospital or clinic, and once when you are not at a hospital or clinic. Record the average of the two measurements. To check your blood pressure when you are not at a hospital or clinic, you can use: ? An automated blood pressure machine at a pharmacy. ? A home blood pressure monitor.  Talk to your health care provider about your target blood pressure.  If you are between 95-75 years old, ask your health care provider if you should take aspirin to prevent heart disease.  Have regular diabetes screenings by checking your fasting blood sugar level. ? If you are at a normal weight and have a low risk for diabetes, have this test once every three years after the age of 29. ? If you are overweight and have a high risk for diabetes, consider being tested at a younger age or more often.  A one-time screening for abdominal aortic aneurysm (AAA) by ultrasound is recommended for men aged 51-75 years who are current or former smokers. What should I know about preventing infection? Hepatitis B If you have a higher risk for hepatitis B, you should be screened for this virus. Talk with your health care provider to find out if you are at risk for hepatitis B infection. Hepatitis C Blood testing is recommended  for:  Everyone born from [birth year] through 1966.  Anyone with known risk factors for hepatitis C.  Sexually Transmitted Diseases (STDs)  You should be screened each year for STDs including gonorrhea and chlamydia if: ? You are sexually active and are younger than 68 years of age. ? You are older than 68 years of age and your health care provider tells you that you are at risk for this type of infection. ? Your sexual activity has changed since you were last screened and you are at an increased risk for chlamydia or gonorrhea. Ask your health care provider if you are at risk.  Talk with your health care provider about whether you are at high risk of being infected with HIV. Your health care provider may recommend a prescription medicine to help prevent HIV infection.  What else can I do?  Schedule regular health, dental, and eye  exams.  Stay current with your vaccines (immunizations).  Do not use any tobacco products, such as cigarettes, chewing tobacco, and e-cigarettes. If you need help quitting, ask your health care provider.  Limit alcohol intake to no more than 2 drinks per day. One drink equals 12 ounces of beer, 5 ounces of wine, or 1 ounces of hard liquor.  Do not use street drugs.  Do not share needles.  Ask your health care provider for help if you need support or information about quitting drugs.  Tell your health care provider if you often feel depressed.  Tell your health care provider if you have ever been abused or do not feel safe at home. This information is not intended to replace advice given to you by your health care provider. Make sure you discuss any questions you have with your health care provider. Document Released: 08/18/2007 Document Revised: 10/19/2015 Document Reviewed: 11/23/2014 Elsevier Interactive Patient Education  [employer].

## 2017-06-30 NOTE — Progress Notes (Signed)
Subjective:    Patient ID: Charles Tate, male    DOB: 1950/06/07, 67 y.o.   MRN: [id number]  HPI Here for medicare wellness exam and a physical exam.   I have personally reviewed and have noted 1.The patient's medical and social history 2.Their use of alcohol, tobacco or illicit drugs 3.Their current medications and supplements 4.The patient's functional ability including ADL's, fall risks, home                 safety risk and hearing or visual impairment. 5.Diet and physical activities 6.Evidence for depression or mood disorders 7.Care team reviewed  - urology - dr Junious Silk, eye doctor  Left hip pain intermittently for years.  Over the past few months it has gotten worse.  He also recently tweaked his knee as well on the left when he was moving equipment - the hip has gotten worse since then.  The hip pain is in the posterior lateral hip and sometimes radiates down the leg.     Are there smokers in your home (other than you)? No  Risk Factors Exercise: none due to left leg pain Dietary issues discussed:  Well balanced, healthy diet  Vitamin and supplement use:  MVI only  Opiod use:   none Side effects from medication:  n/a Does medications benefits outweigh risks/side effects:   n/a  Cardiac risk factors: advanced age, hypertension, hyperlipidemia  Depression Screen  Have you felt down, depressed or hopeless? No  Have you felt little interest or pleasure in doing things?  No  Activities of Daily Living In your present state of health, do you have any difficulty performing the following activities?:  Driving? No Managing money?  No Feeding yourself? No Getting from bed to chair? No Climbing a flight of stairs? No Preparing food and eating?: No Bathing or showering? No Getting dressed: No Getting to/using the toilet? No Moving around from place to place: No In the past year have you fallen or had a  near fall?: No   Are you sexually active?  yes  Do you have more than one partner?  no  Hearing Difficulties: No Do you often ask people to speak up or repeat themselves? No Do you experience ringing or noises in your ears? No Do you have difficulty understanding soft or whispered voices? No Vision:              Any change in vision:  no             Up to date with eye exam:   Due - will schedule  Memory:  Still working at a highly functioning level  Do you feel that you have a problem with memory? No  Do you often misplace items? No  Do you feel safe at home?  Yes  Cognitive Testing  Alert, Orientated? Yes  Normal Appearance? Yes  Recall of three objects?  Yes  Can perform simple calculations? Yes  Displays appropriate judgment? Yes  Can read the correct time from a watch face? Yes   Advanced Directives have been discussed with the patient? Yes - has paper work     Medications and allergies reviewed with patient and updated if appropriate.  Patient Active Problem List   Diagnosis Date Noted  . Left leg pain 07/04/2017  . Left inguinal hernia 07/04/2017  . Allergic rhinitis 12/28/2015  . H/O: rheumatic fever 12/28/2015  . Undiagnosed cardiac murmurs 12/28/2015  . Prediabetes 12/27/2015  . Rising PSA level  02/21/2014  . History of colon polyps 02/17/2014  . 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 Hyperlipidemia 01/07/2009  . Asthma 06/04/2007  . GERD 06/04/2007  . Essential hypertension 09/26/2006    Current Outpatient Medications on File Prior to Visit  Medication Sig Dispense Refill  . albuterol (PROVENTIL HFA;VENTOLIN HFA) 108 (90 Base) MCG/ACT inhaler Inhale 2 puffs into the lungs every 6 (six) hours as needed for wheezing or shortness of breath. 1 Inhaler 2  . azelastine (ASTELIN) 0.1 % nasal spray Place 1 spray into both nostrils 2 (two) times daily. Use in each nostril as directed 30 mL 12  . fluticasone (FLONASE) 50 MCG/ACT nasal  spray Place 2 sprays into both nostrils daily. 16 g 6  . Multiple Vitamin (MULTIVITAMIN) capsule Take 1 capsule by mouth daily.      . ranitidine (ZANTAC) 150 MG tablet Take 1 tablet (150 mg total) by mouth 2 (two) times daily. 60 tablet 0  . telmisartan-hydrochlorothiazide (MICARDIS HCT) 80-25 MG tablet Take 1 tablet by mouth daily. 30 tablet 5   No current facility-administered medications on file prior to visit.     Past Medical History:  Diagnosis Date  . Allergy   . Arthritis    left hip   . GERD (gastroesophageal reflux disease)   . Heart murmur    adolescent rheumatic fever - developed murmur   . Hyperlipidemia   . Hypertension   . RAD (reactive airway disease)    with RTIs    Past Surgical History:  Procedure Laterality Date  . COLONOSCOPY    . colonoscopy with polypectomy  2013   Dr Ferdinand Lango, [facility]  . POLYPECTOMY    . WISDOM TOOTH EXTRACTION      Social History   Socioeconomic History  . Marital status: Married    Spouse name: Not on file  . Number of children: Not on file  . Years of education: Not on file  . Highest education level: Not on file  Occupational History  . Not on file  Social Needs  . Financial resource strain: Not on file  . Food insecurity:    Worry: Not on file    Inability: Not on file  . Transportation needs:    Medical: Not on file    Non-medical: Not on file  Tobacco Use  . Smoking status: Former Research scientist (life sciences)  . Smokeless tobacco: Never Used  . Tobacco comment: intermittent, short term smoker as a teen. Some second hand smoke in 20s & 30s  Substance and Sexual Activity  . Alcohol use: Yes    Comment:   very rarely  . Drug use: No  . Sexual activity: Not on file  Lifestyle  . Physical activity:    Days per week: Not on file    Minutes per session: Not on file  . Stress: Not on file  Relationships  . Social connections:    Talks on phone: Not on file    Gets together: Not on file    Attends religious service: Not on file     Active member of club or organization: Not on file    Attends meetings of clubs or organizations: Not on file    Relationship status: Not on file  Other Topics Concern  . Not on file  Social History Narrative   Exercise: none regularly    Family History  Problem Relation Age of Onset  . Arthritis Mother   . Hyperlipidemia Mother   . Lupus Mother   .  Heart attack Father 15  . Hypertension Sister   . Heart attack Paternal Grandmother 17  . Diabetes Paternal Aunt   . Stroke Maternal Grandfather 60  . Cancer Paternal Grandfather        ? stomach; in 56s  . Colon cancer Paternal Grandfather   . Colon polyps Neg Hx   . Esophageal cancer Neg Hx   . Rectal cancer Neg Hx   . Stomach cancer Neg Hx     Review of Systems  Constitutional: Negative for chills and fever.  Eyes: Negative for visual disturbance.  Respiratory: Negative for cough, shortness of breath and wheezing.   Cardiovascular: Negative for chest pain, palpitations and leg swelling.  Gastrointestinal: Negative for abdominal pain, blood in stool, constipation, diarrhea and nausea.  Genitourinary: Negative for difficulty urinating, dysuria and hematuria.  Musculoskeletal: Positive for arthralgias (left knee and left hip, mild hands).  Skin: Negative for color change and rash.  Neurological: Negative for light-headedness and headaches.  Psychiatric/Behavioral: Negative for dysphoric mood. The patient is not nervous/anxious.        Objective:   Vitals:   07/04/17 0902  BP: 114/76  Pulse: 69  Resp: 16  Temp: 98.1 F (36.7 C)  SpO2: 98%   Filed Weights   07/04/17 0902  Weight: 211 lb (95.7 kg)   Body mass index is 26.37 kg/m.  Wt Readings from Last 3 Encounters:  07/04/17 211 lb (95.7 kg)  06/19/17 213 lb (96.6 kg)  05/22/17 214 lb (97.1 kg)     Physical Exam Constitutional: He appears well-developed and well-nourished. No distress.  HENT:  Head: Normocephalic and atraumatic.  Right Ear: External ear  normal.  Left Ear: External ear normal.  Mouth/Throat: Oropharynx is clear and moist.  Normal ear canals and TM b/l  Eyes: Conjunctivae and EOM are normal.  Neck: Neck supple. No tracheal deviation present. No thyromegaly present.  No carotid bruit  Cardiovascular: Normal rate, regular rhythm, normal heart sounds and intact distal pulses.   No murmur heard. Pulmonary/Chest: Effort normal and breath sounds normal. No respiratory distress. He has no wheezes. He has no rales.  Abdominal: Soft. He exhibits no distension. There is no tenderness.  Genitourinary: deferred  Musculoskeletal: He exhibits no edema.  Lymphadenopathy:   He has no cervical adenopathy.  Skin: Skin is warm and dry. He is not diaphoretic.  Psychiatric: He has a normal mood and affect. His behavior is normal.    The 10-year ASCVD risk score Mikey Bussing DC Jr., et al., 2013) is: 18%   Values used to calculate the score:     Age: 19 years     Sex: Male     Is Non-Hispanic African American: No     Diabetic: No     Tobacco smoker: No     Systolic Blood Pressure: 696 mmHg     Is BP treated: Yes     HDL Cholesterol: 32.8 mg/dL     Total Cholesterol: 191 mg/dL      Assessment & Plan:   Wellness Exam: Immunizations   prevnar today, discussed shingrix, others up to date Colonoscopy   Due --  Will schedule Eye exams    Due - will schedule  Hearing loss  none Memory concerns/difficulties   none Independent of ADLs    Fully independent   Patient received copy of preventative screening tests/immunizations recommended for the next 5-10 years.    Physical exam: Screening blood work  ordered Immunizations   prevnar today, discussed shingrix,  others up to date Colonoscopy   Due --   Will schedule Eye exams    Due - will schedule  EKG  Done 12/2015 Exercise  None - due to left leg pain Weight advised some weight loss Skin  No concerns, sees derm Substance abuse  none  See Problem List for Assessment and Plan of  chronic medical problems.

## 2017-07-04 ENCOUNTER — Other Ambulatory Visit (INDEPENDENT_AMBULATORY_CARE_PROVIDER_SITE_OTHER): Payer: Medicare HMO

## 2017-07-04 ENCOUNTER — Encounter: Payer: Self-pay | Admitting: Internal Medicine

## 2017-07-04 ENCOUNTER — Ambulatory Visit (INDEPENDENT_AMBULATORY_CARE_PROVIDER_SITE_OTHER): Payer: Medicare HMO | Admitting: Internal Medicine

## 2017-07-04 VITALS — BP 114/76 | HR 69 | Temp 98.1°F | Resp 16 | Ht 75.0 in | Wt 211.0 lb

## 2017-07-04 DIAGNOSIS — J45909 Unspecified asthma, uncomplicated: Secondary | ICD-10-CM

## 2017-07-04 DIAGNOSIS — Z0000 Encounter for general adult medical examination without abnormal findings: Secondary | ICD-10-CM

## 2017-07-04 DIAGNOSIS — K4090 Unilateral inguinal hernia, without obstruction or gangrene, not specified as recurrent: Secondary | ICD-10-CM | POA: Insufficient documentation

## 2017-07-04 DIAGNOSIS — E785 Hyperlipidemia, unspecified: Secondary | ICD-10-CM

## 2017-07-04 DIAGNOSIS — R7303 Prediabetes: Secondary | ICD-10-CM

## 2017-07-04 DIAGNOSIS — Z23 Encounter for immunization: Secondary | ICD-10-CM | POA: Diagnosis not present

## 2017-07-04 DIAGNOSIS — R972 Elevated prostate specific antigen [PSA]: Secondary | ICD-10-CM

## 2017-07-04 DIAGNOSIS — I10 Essential (primary) hypertension: Secondary | ICD-10-CM

## 2017-07-04 DIAGNOSIS — M79605 Pain in left leg: Secondary | ICD-10-CM | POA: Insufficient documentation

## 2017-07-04 DIAGNOSIS — K219 Gastro-esophageal reflux disease without esophagitis: Secondary | ICD-10-CM

## 2017-07-04 LAB — TSH: TSH: 1.37 u[IU]/mL (ref 0.35–4.50)

## 2017-07-04 LAB — LIPID PANEL
Cholesterol: 191 mg/dL (ref 0–200)
HDL: 32.8 mg/dL — ABNORMAL LOW (ref >39.00)
LDL Cholesterol: 133 mg/dL — ABNORMAL HIGH (ref 0–99)
NonHDL: 158.02
Total CHOL/HDL Ratio: 6
Triglycerides: 127 mg/dL (ref 0.0–149.0)
VLDL: 25.4 mg/dL (ref 0.0–40.0)

## 2017-07-04 LAB — COMPREHENSIVE METABOLIC PANEL
ALT: 18 U/L (ref 0–53)
AST: 18 U/L (ref 0–37)
Albumin: 4.3 g/dL (ref 3.5–5.2)
Alkaline Phosphatase: 72 U/L (ref 39–117)
BUN: 15 mg/dL (ref 6–23)
CO2: 28 mEq/L (ref 19–32)
Calcium: 9 mg/dL (ref 8.4–10.5)
Chloride: 103 mEq/L (ref 96–112)
Creatinine, Ser: 0.87 mg/dL (ref 0.40–1.50)
GFR: 92.75 mL/min (ref >60.00)
Glucose, Bld: 105 mg/dL — ABNORMAL HIGH (ref 70–99)
Potassium: 4.2 mEq/L (ref 3.5–5.1)
Sodium: 140 mEq/L (ref 135–145)
Total Bilirubin: 0.7 mg/dL (ref 0.2–1.2)
Total Protein: 7 g/dL (ref 6.0–8.3)

## 2017-07-04 LAB — CBC WITH DIFFERENTIAL/PLATELET
Basophils Absolute: 0 10*3/uL (ref 0.0–0.1)
Basophils Relative: 0.8 % (ref 0.0–3.0)
Eosinophils Absolute: 0.1 10*3/uL (ref 0.0–0.7)
Eosinophils Relative: 1.2 % (ref 0.0–5.0)
HCT: 44.1 % (ref 39.0–52.0)
Hemoglobin: 15.2 g/dL (ref 13.0–17.0)
Lymphocytes Relative: 31.2 % (ref 12.0–46.0)
Lymphs Abs: 1.6 10*3/uL (ref 0.7–4.0)
MCHC: 34.5 g/dL (ref 30.0–36.0)
MCV: 87.1 fl (ref 78.0–100.0)
Monocytes Absolute: 0.5 10*3/uL (ref 0.1–1.0)
Monocytes Relative: 9.1 % (ref 3.0–12.0)
Neutro Abs: 2.9 10*3/uL (ref 1.4–7.7)
Neutrophils Relative %: 57.7 % (ref 43.0–77.0)
Platelets: 228 10*3/uL (ref 150.0–400.0)
RBC: 5.06 Mil/uL (ref 4.22–5.81)
RDW: 12.9 % (ref 11.5–15.5)
WBC: 5.1 10*3/uL (ref 4.0–10.5)

## 2017-07-04 LAB — HEMOGLOBIN A1C: Hgb A1c MFr Bld: 5.9 % (ref 4.6–6.5)

## 2017-07-04 LAB — PSA, MEDICARE: PSA: 6 ng/ml — ABNORMAL HIGH (ref 0.10–4.00)

## 2017-07-04 NOTE — Addendum Note (Signed)
Addended by: Terence Lux B on: 07/04/2017 12:46 PM   Modules accepted: Orders

## 2017-07-04 NOTE — Assessment & Plan Note (Signed)
Check a1c Low sugar / carb diet Stressed regular exercise   

## 2017-07-04 NOTE — Assessment & Plan Note (Signed)
Having acute on chronic left hip/leg pain and acute left knee pain Consider sports medicine referral - he will let me know

## 2017-07-04 NOTE — Assessment & Plan Note (Signed)
Has not needed the albuterol Controlled Albuterol prn

## 2017-07-04 NOTE — Assessment & Plan Note (Addendum)
GERD controlled Continue daily medication - will try decreasing from BID to QD

## 2017-07-04 NOTE — Assessment & Plan Note (Signed)
Check lipid panel, CMP, TSH Has never been on medication Ideally would like to avoid medication Encouraged regular exercise, weight loss

## 2017-07-04 NOTE — Assessment & Plan Note (Addendum)
Following with urology - dr Enid Derry Will check psa

## 2017-07-04 NOTE — Assessment & Plan Note (Signed)
BP well controlled Current regimen effective and well tolerated Continue current medications at current doses cmp  

## 2017-07-04 NOTE — Assessment & Plan Note (Signed)
Not symptomatic no further eval/treatment needed

## 2017-07-16 ENCOUNTER — Other Ambulatory Visit: Payer: Self-pay | Admitting: Internal Medicine

## 2017-07-18 ENCOUNTER — Encounter: Payer: Self-pay | Admitting: Internal Medicine

## 2017-08-01 NOTE — Progress Notes (Signed)
[facility] [place] [place], [place] [address] Phone: [phone] Subjective:    I'm seeing this patient by the request  of:  [name], MD   CC: Left knee and left hip pain  [id number]  Charles Tate is a 67 y.o. male coming in with complaint of left knee pain and left hip pain. He has had hip pain for a couple of years.  Pain in his hip is over the piriformis muscle. 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 He said that he puts his right leg forward and the left leg stays behind him in hip extension in which he rocks back and forth on to move the carpet cleaner.   He injured the knee one month ago when he tried to catch something falling off of a hand truck. Foot was planted on ground and he externally rotated. Patient notes a weakness with walking or standing from a prolonged seated position. Patient's knees are sore for the first few steps but pain does decrease after moving around more than being sedentary. Pain is felt on postero-medial aspect of knee. Pain can occasionally go into calf. Was having a decrease in pain but did twist his knee again while using a post hole digger.    Knee Onset- on month Location- medial left knee Duration- intermittent Character-dull Aggravating factors-prolonged sitting Reliving factors- movement Therapies tried- stretching  Also left hip pain   Onset- one month Location- left piriformis Duration- intermittent Character-dull Aggravating factors-prolonged sitting Reliving factors- not working Therapies tried- stretching   Past Medical History:  Diagnosis Date  . Allergy   . Arthritis    left hip   . GERD (gastroesophageal reflux disease)   . Heart murmur    adolescent rheumatic fever - developed murmur   . Hyperlipidemia   . Hypertension   . RAD (reactive airway disease)    with RTIs   Past Surgical History:  Procedure Laterality Date  .  COLONOSCOPY    . colonoscopy with polypectomy  2013   Dr Ferdinand Lango, [facility]  . POLYPECTOMY    . WISDOM TOOTH EXTRACTION     Social History   Socioeconomic History  . Marital status: Married    Spouse name: Not on file  . Number of children: Not on file  . Years of education: Not on file  . Highest education level: Not on file  Occupational History  . Not on file  Social Needs  . Financial resource strain: Not on file  . Food insecurity:    Worry: Not on file    Inability: Not on file  . Transportation needs:    Medical: Not on file    Non-medical: Not on file  Tobacco Use  . Smoking status: Former Research scientist (life sciences)  . Smokeless tobacco: Never Used  . Tobacco comment: intermittent, short term smoker as a teen. Some second hand smoke in 20s & 30s  Substance and Sexual Activity  . Alcohol use: Yes    Comment:   very rarely  . Drug use: No  . Sexual activity: Not on file  Lifestyle  . Physical activity:    Days per week: Not on file    Minutes per session: Not on file  . Stress: Not on file  Relationships  . Social connections:    Talks on phone: Not on file    Gets together: Not on file    Attends religious service:  Not on file    Active member of club or organization: Not on file    Attends meetings of clubs or organizations: Not on file    Relationship status: Not on file  Other Topics Concern  . Not on file  Social History Narrative   Exercise: none regularly   Allergies  Allergen Reactions  . Ciprofloxacin Hcl     Numbness, joint pain  . Cefdinir Other (See Comments)    Fever, chills, stomach cramps and diarrhea  . Lisinopril Cough   Family History  Problem Relation Age of Onset  . Arthritis Mother   . Hyperlipidemia Mother   . Lupus Mother   . Heart attack Father 65  . Hypertension Sister   . Heart attack Paternal Grandmother 12  . Diabetes Paternal Aunt   . Stroke Maternal Grandfather 59  . Cancer Paternal Grandfather        ? stomach; in 20s  . Colon  cancer Paternal Grandfather   . Colon polyps Neg Hx   . Esophageal cancer Neg Hx   . Rectal cancer Neg Hx   . Stomach cancer Neg Hx      Past medical history, social, surgical and family history all reviewed in electronic medical record.  No pertanent information unless stated regarding to the chief complaint.   Review of Systems:Review of systems updated and as accurate as of 08/05/17  No headache, visual changes, nausea, vomiting, diarrhea, constipation, dizziness, abdominal pain, skin rash, fevers, chills, night sweats, weight loss, swollen lymph nodes, body aches, joint swelling, muscle aches, chest pain, shortness of breath, mood changes.   Objective  Blood pressure 122/72, pulse 64, height 6\' 3"  (1.905 m), weight 216 lb (98 kg), SpO2 98 %. Systems examined below as of 08/05/17   General: No apparent distress alert and oriented x3 mood and affect normal, dressed appropriately.  HEENT: Pupils equal, extraocular movements intact  Respiratory: Patient's speak in full sentences and does not appear short of breath  Cardiovascular: No lower extremity edema, non tender, no erythema  Skin: Warm dry intact with no signs of infection or rash on extremities or on axial skeleton.  Abdomen: Soft nontender  Neuro: Cranial nerves II through XII are intact, neurovascularly intact in all extremities with 2+ DTRs and 2+ pulses.  Lymph: No lymphadenopathy of posterior or anterior cervical chain or axillae bilaterally.  Gait normal with good balance and coordination.  MSK:  Non tender with full range of motion and good stability and symmetric strength and tone of shoulders, elbows, wrist, and ankles bilaterally.  Knee: Left Normal to inspection with no erythema or effusion or obvious bony abnormalities. Pain over the medial joint line ROM full in flexion and extension and lower leg rotation. Ligaments with solid consistent endpoints including ACL, PCL, LCL, MCL pain with stressing the MCL  noted. Mild positive Mcmurray's, Apley's, and Thessalonian tests. Non painful patellar compression. Patellar glide without crepitus. Patellar and quadriceps tendons unremarkable. Hamstring and quadriceps strength is normal. Abdominal knee unremarkable  Left hip shows some tenderness over the gluteal tendon laterally.  Positive Faber test.  Negative straight leg test.  Mild tightness of the lower back.  Deep tendon reflexes intact.  MSK US performed of: Left knee This study was ordered, performed, and interpreted by Charlann Boxer D.O.  Knee: All structures visualized. MCL has hypoechoic changes and mild increase in Doppler flow but no increase in gapping noted. Patellar Tendon unremarkable on long and transverse views without effusion. Mild increase in pedis  anserine size with hypoechoic changes  IMPRESSION: MCL sprain      Impression and Recommendations:     This case required medical decision making of moderate complexity.      Note: This dictation was prepared with Dragon dictation along with smaller phrase technology. Any transcriptional errors that result from this process are unintentional.

## 2017-08-05 ENCOUNTER — Encounter: Payer: Self-pay | Admitting: Family Medicine

## 2017-08-05 ENCOUNTER — Ambulatory Visit: Payer: Self-pay

## 2017-08-05 ENCOUNTER — Ambulatory Visit: Payer: Medicare HMO | Admitting: Family Medicine

## 2017-08-05 VITALS — BP 122/72 | HR 64 | Ht 75.0 in | Wt 216.0 lb

## 2017-08-05 DIAGNOSIS — M25562 Pain in left knee: Secondary | ICD-10-CM | POA: Diagnosis not present

## 2017-08-05 DIAGNOSIS — S83412A Sprain of medial collateral ligament of left knee, initial encounter: Secondary | ICD-10-CM

## 2017-08-05 DIAGNOSIS — M7602 Gluteal tendinitis, left hip: Secondary | ICD-10-CM | POA: Insufficient documentation

## 2017-08-05 DIAGNOSIS — G8929 Other chronic pain: Secondary | ICD-10-CM

## 2017-08-05 NOTE — Assessment & Plan Note (Signed)
Gluteal tendinitis.  Discussed icing regimen and home exercises.  Discussed which activities of doing which wants to avoid.  Patient will come back and see me again.  Discussed which activities to do which wants to avoid.  Discussed importance of hip abductor strengthening.  Follow-up again in 4 weeks

## 2017-08-05 NOTE — Assessment & Plan Note (Signed)
Partial tear noted.  No significant displacement.  Encourage the vitamin D and, hinged brace given.  Discussed avoiding certain motions.  Discussed topical anti-inflammatories and icing regimen.  Follow-up again in 3 to 4 weeks.  Worsening symptoms consider formal physical therapy.

## 2017-08-05 NOTE — Patient Instructions (Signed)
Good to see you  MCL partial tear and glute tednonitis Ice is your friend. Ice 20 minutes 2 times daily. Usually after activity and before bed. Consider the brace with a lot of activity and working out pennsaid pinkie amount topically 2 times daily as needed.  Exercises 3 times a week.   No twisting motions Biking would be better for cardio  See me again in 3-4 weeks

## 2017-08-13 ENCOUNTER — Ambulatory Visit: Payer: Medicare HMO | Admitting: Family Medicine

## 2017-08-13 DIAGNOSIS — R972 Elevated prostate specific antigen [PSA]: Secondary | ICD-10-CM | POA: Diagnosis not present

## 2017-09-03 ENCOUNTER — Ambulatory Visit: Payer: Medicare HMO | Admitting: Family Medicine

## 2017-09-11 NOTE — Progress Notes (Signed)
[facility] [address] [place], [place] [address] Phone: [phone] Subjective:     CC: Left knee pain follow-up  [id number]  Charles Tate is a 67 y.o. male coming in with complaint of left knee pain follow-up.  Patient states that there is no instability.  85% better.  No numbness.  No swelling.    Past Medical History:  Diagnosis Date  . Allergy   . Arthritis    left hip   . GERD (gastroesophageal reflux disease)   . Heart murmur    adolescent rheumatic fever - developed murmur   . Hyperlipidemia   . Hypertension   . RAD (reactive airway disease)    with RTIs   Past Surgical History:  Procedure Laterality Date  . COLONOSCOPY    . colonoscopy with polypectomy  2013   Dr Ferdinand Lango, [facility]  . POLYPECTOMY    . WISDOM TOOTH EXTRACTION     Social History   Socioeconomic History  . Marital status: Married    Spouse name: Not on file  . Number of children: Not on file  . Years of education: Not on file  . Highest education level: Not on file  Occupational History  . Not on file  Social Needs  . Financial resource strain: Not on file  . Food insecurity:    Worry: Not on file    Inability: Not on file  . Transportation needs:    Medical: Not on file    Non-medical: Not on file  Tobacco Use  . Smoking status: Former Research scientist (life sciences)  . Smokeless tobacco: Never Used  . Tobacco comment: intermittent, short term smoker as a teen. Some second hand smoke in 20s & 30s  Substance and Sexual Activity  . Alcohol use: Yes    Comment:   very rarely  . Drug use: No  . Sexual activity: Not on file  Lifestyle  . Physical activity:    Days per week: Not on file    Minutes per session: Not on file  . Stress: Not on file  Relationships  . Social connections:    Talks on phone: Not on file    Gets together: Not on file    Attends religious service: Not on file    Active member of club or organization: Not on file    Attends meetings of  clubs or organizations: Not on file    Relationship status: Not on file  Other Topics Concern  . Not on file  Social History Narrative   Exercise: none regularly   Allergies  Allergen Reactions  . Ciprofloxacin Hcl     Numbness, joint pain  . Cefdinir Other (See Comments)    Fever, chills, stomach cramps and diarrhea  . Lisinopril Cough   Family History  Problem Relation Age of Onset  . Arthritis Mother   . Hyperlipidemia Mother   . Lupus Mother   . Heart attack Father 75  . Hypertension Sister   . Heart attack Paternal Grandmother 42  . Diabetes Paternal Aunt   . Stroke Maternal Grandfather 19  . Cancer Paternal Grandfather        ? stomach; in 80s  . Colon cancer Paternal Grandfather   . Colon polyps Neg Hx   . Esophageal cancer Neg Hx   . Rectal cancer Neg Hx   . Stomach cancer Neg Hx      Past medical history, social, surgical and family history all reviewed in electronic medical  record.  No pertanent information unless stated regarding to the chief complaint.   Review of Systems:Review of systems updated and as accurate as of 09/12/17  No headache, visual changes, nausea, vomiting, diarrhea, constipation, dizziness, abdominal pain, skin rash, fevers, chills, night sweats, weight loss, swollen lymph nodes, body aches, joint swelling, muscle aches, chest pain, shortness of breath, mood changes.   Objective  Blood pressure 120/74, pulse 70, height 6\' 3"  (1.905 m), weight 214 lb (97.1 kg), SpO2 97 %. Systems examined below as of 09/12/17   General: No apparent distress alert and oriented x3 mood and affect normal, dressed appropriately.  HEENT: Pupils equal, extraocular movements intact  Respiratory: Patient's speak in full sentences and does not appear short of breath  Cardiovascular: No lower extremity edema, non tender, no erythema  Skin: Warm dry intact with no signs of infection or rash on extremities or on axial skeleton.  Abdomen: Soft nontender  Neuro:  Cranial nerves II through XII are intact, neurovascularly intact in all extremities with 2+ DTRs and 2+ pulses.  Lymph: No lymphadenopathy of posterior or anterior cervical chain or axillae bilaterally.  Gait normal with good balance and coordination.  MSK:  Non tender with full range of motion and good stability and symmetric strength and tone of shoulders, elbows, wrist, hip, and ankles bilaterally.  Left knee exam shows some mild tenderness over the medial joint line but no gapping noted.  Patient does have full range of motion.  Some tightness of the hamstring on the left compared to the right.  Negative straight leg test.  Some pain over the peds anserine area.    Impression and Recommendations:     This case required medical decision making of moderate complexity.      Note: This dictation was prepared with Dragon dictation along with smaller phrase technology. Any transcriptional errors that result from this process are unintentional.

## 2017-09-12 ENCOUNTER — Ambulatory Visit (INDEPENDENT_AMBULATORY_CARE_PROVIDER_SITE_OTHER): Payer: Medicare HMO | Admitting: Family Medicine

## 2017-09-12 ENCOUNTER — Ambulatory Visit (INDEPENDENT_AMBULATORY_CARE_PROVIDER_SITE_OTHER)
Admission: RE | Admit: 2017-09-12 | Discharge: 2017-09-12 | Disposition: A | Discharge status: Home / Self Care | Payer: Medicare HMO | Source: Ambulatory Visit | Attending: Family Medicine | Admitting: Family Medicine

## 2017-09-12 ENCOUNTER — Encounter: Payer: Self-pay | Admitting: Family Medicine

## 2017-09-12 VITALS — BP 120/74 | HR 70 | Ht 75.0 in | Wt 214.0 lb

## 2017-09-12 DIAGNOSIS — S83412A Sprain of medial collateral ligament of left knee, initial encounter: Secondary | ICD-10-CM | POA: Diagnosis not present

## 2017-09-12 DIAGNOSIS — G8929 Other chronic pain: Secondary | ICD-10-CM

## 2017-09-12 DIAGNOSIS — M25562 Pain in left knee: Secondary | ICD-10-CM

## 2017-09-12 DIAGNOSIS — M7602 Gluteal tendinitis, left hip: Secondary | ICD-10-CM

## 2017-09-12 NOTE — Assessment & Plan Note (Signed)
Stable.  Likely compensation.  Discussed icing regimen and home exercise.  Follow-up again in 4 to 6 weeks

## 2017-09-12 NOTE — Patient Instructions (Signed)
Good to see you  Alvera Singh is your friend.  PT will be calling you  OK to bike or elliptical and machine weights if you want to start  Tart cherry extract any dose at night See me again in 6 weeks

## 2017-09-12 NOTE — Assessment & Plan Note (Signed)
Sprain noted.  Seems to be doing much better.  I believe that most of the pain is secondary to the hamstring tendinitis.  Discussed icing regimen and home exercises.  Patient will be sent to formal physical therapy. X-ray ordered.  Follow-up again in 4 weeks

## 2017-09-16 DIAGNOSIS — D225 Melanocytic nevi of trunk: Secondary | ICD-10-CM | POA: Diagnosis not present

## 2017-09-16 DIAGNOSIS — L578 Other skin changes due to chronic exposure to nonionizing radiation: Secondary | ICD-10-CM | POA: Diagnosis not present

## 2017-09-16 DIAGNOSIS — L821 Other seborrheic keratosis: Secondary | ICD-10-CM | POA: Diagnosis not present

## 2017-10-03 ENCOUNTER — Encounter: Payer: Self-pay | Admitting: Physical Therapy

## 2017-10-03 ENCOUNTER — Ambulatory Visit: Payer: Medicare HMO | Attending: Family Medicine | Admitting: Physical Therapy

## 2017-10-03 DIAGNOSIS — M25562 Pain in left knee: Secondary | ICD-10-CM

## 2017-10-03 DIAGNOSIS — M25552 Pain in left hip: Secondary | ICD-10-CM

## 2017-10-03 NOTE — Therapy (Signed)
[facility] [address] [place], [place], [address] Phone: [phone]   Fax:  [phone]  Physical Therapy Evaluation  Patient Details  Name: Charles Tate MRN: [id number] Date of Birth: 11-16-1950 Referring Provider: Creig Hines   Encounter Date: 10/03/2017  PT End of Session - 10/03/17 1521    Visit Number  1    Date for PT Re-Evaluation  12/03/17    PT Start Time  1435    PT Stop Time  1522    PT Time Calculation (min)  47 min    Activity Tolerance  Patient tolerated treatment well    Behavior During Therapy  [facility] for tasks assessed/performed       Past Medical History:  Diagnosis Date  . Allergy   . Arthritis    left hip   . GERD (gastroesophageal reflux disease)   . Heart murmur    adolescent rheumatic fever - developed murmur   . Hyperlipidemia   . Hypertension   . RAD (reactive airway disease)    with RTIs    Past Surgical History:  Procedure Laterality Date  . COLONOSCOPY    . colonoscopy with polypectomy  2013   Dr Ferdinand Lango, [facility]  . POLYPECTOMY    . WISDOM TOOTH EXTRACTION      There were no vitals filed for this visit.   Subjective Assessment - 10/03/17 1449    Subjective  Patient reports that he was moving speakers on a hand truck and it slipped , he made a move to catch them and twisted his left knee, he reports he had medial knee pain.  This was in April/May, he reports that he had been having left hip pain for a while and now that he was favoring the left knee the hip is hurting worse.  A MS US showed a left MCL sprain.      Limitations  Lifting;Standing;Walking;House hold activities    Patient Stated Goals  have less pain    Currently in Pain?  Yes    Pain Score  0-No pain    Pain Location  Knee and the posterior/lateral hip    Pain Orientation  Left;Medial    Pain Descriptors / Indicators  Aching;Sore;Tightness    Pain Type  Acute pain    Pain Onset  More than a month ago    Pain Frequency  Intermittent    Aggravating Factors   lifting the leg, bending knee soreness up to 5/10    Pain Relieving Factors  rest and easy walking    Effect of Pain on Daily Activities  limited with in and out of car, stairs         [facility] PT Assessment - 10/03/17 0001      Assessment   Medical Diagnosis  left knee and hip pain    Referring Provider  Z. Smith    Onset Date/Surgical Date  09/02/17    Prior Therapy  no      Precautions   Precautions  None      Balance Screen   Has the patient fallen in the past 6 months  No    Has the patient had a decrease in activity level because of a fear of falling?   No    Is the patient reluctant to leave their home because of a fear of falling?   No      Home Environment   Additional Comments  does yardwork, no stairs  Prior Function   Level of Independence  Independent    Vocation  Part time employment    Marine scientist    Leisure  plays in bands, lifts speakers and keyboard      ROM / Strength   AROM / PROM / Strength  AROM;Strength      AROM   Overall AROM Comments  left knee AROM 5-110 degrees flexion with some pain at the end ranges, left hip ROM is WFL's with some pain in the groin      Strength   Overall Strength Comments  left knee and hip 4/5 with some knee and hip pain      Flexibility   Soft Tissue Assessment /Muscle Length  yes    Hamstrings  tight    Quadriceps  tight    ITB  tight    Piriformis  very tight      Palpation   Palpation comment  mild tenderness left knee medial joint line, some tenerness in the left piriformis                 Objective measurements completed on examination: See above findings.      [facility] PT Treatment/Exercise - 10/03/17 0001      Exercises   Exercises  Knee/Hip      Knee/Hip Exercises: Aerobic   Recumbent Bike  level 1 x 3 minutes    Nustep  level 5 x 5 minutes      Knee/Hip Exercises: Machines for Strengthening   Other  Machine  seated row and lats 25#               PT Short Term Goals - 10/03/17 1544      PT SHORT TERM GOAL #1   Title  independent with initial HEP    Time  2    Period  Weeks    Status  New        PT Long Term Goals - 10/03/17 1544      PT LONG TERM GOAL #1   Title  independent with safe gym activities    Time  8    Period  Weeks    Status  New      PT LONG TERM GOAL #2   Title  no pain    Time  8    Period  Weeks    Status  New             Plan - 10/03/17 1522    Clinical Impression Statement  Patient twisted the left knee in late April, MD feels it is MCL sprain, he reports that he has had left hip pain as well from favoring the knee.  He is very tight in the ITB, HS, calf and piriformis mms.  He does report that he wants to start going to the gym, he does have a lot of questions and has some fear about going to the gym.  Wants to be safe with going to the gym    Clinical Presentation  Stable    Clinical Decision Making  Low    Rehab Potential  Good    PT Frequency  1x / week    PT Duration  8 weeks    PT Treatment/Interventions  ADLs/Self Care Home Management;Cryotherapy;Iontophoresis 4mg /ml Dexamethasone;Functional mobility training;Therapeutic activities;Therapeutic exercise;Balance training;Neuromuscular re-education;Manual techniques;Patient/family education    PT Next Visit Plan  Patient is to try the HEP and start gentle aerobics over the next two  weeks and then return and we will advance him to gym for LE's and safety    Consulted and Agree with Plan of Care  Patient       Patient will benefit from skilled therapeutic intervention in order to improve the following deficits and impairments:  Decreased range of motion, Difficulty walking, Increased muscle spasms, Pain, Decreased activity tolerance, Impaired flexibility, Increased edema, Decreased strength, Decreased mobility  Visit Diagnosis: Acute pain of left knee - Plan: PT plan of care  cert/re-cert  Pain in left hip - Plan: PT plan of care cert/re-cert     Problem List Patient Active Problem List   Diagnosis Date Noted  . Tear of MCL (medial collateral ligament) of knee, left, initial encounter 08/05/2017  . Gluteal tendinitis of left buttock 08/05/2017  . Left leg pain 07/04/2017  . Left inguinal hernia 07/04/2017  . Allergic rhinitis 12/28/2015  . H/O: rheumatic fever 12/28/2015  . Undiagnosed cardiac murmurs 12/28/2015  . Prediabetes 12/27/2015  . Rising PSA level 02/21/2014  . History of colon polyps 02/17/2014  . 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 Hyperlipidemia 01/07/2009  . Asthma 06/04/2007  . GERD 06/04/2007  . Essential hypertension 09/26/2006    Sumner Boast., PT 10/03/2017, 3:47 PM  [address] [place], [place], [address] Phone: [phone]   Fax:  [phone]  Name: Charles Tate MRN: [id number] Date of Birth: 08/10/1950

## 2017-10-03 NOTE — Patient Instructions (Addendum)
Access Code: [email or web address]  URL: [email or web address]  Date: 10/03/2017  Prepared by: Lum Babe   Exercises  Supine ITB Stretch with Strap - 5 reps - 3 sets - 20 hold - 2x daily - 7x weekly  Supine Piriformis Stretch Pulling Heel to Hip - 5 reps - 3 sets - 20 hold - 2x daily - 7x weekly  Seated Hamstring Stretch with Chair - 5 reps - 3 sets - 60 hold - 2x daily - 7x weekly  Standing Gastroc Stretch on Step with Counter Support - 5 reps - 3 sets - 20 hold - 2x daily - 7x weekly  Also went over starting at the gym, bike, NuStep and elliptical

## 2017-10-21 ENCOUNTER — Ambulatory Visit: Payer: Medicare HMO | Admitting: Physical Therapy

## 2017-10-21 NOTE — Progress Notes (Signed)
[name] [place], [place] [address] Phone: [phone] Subjective:       CC: left knee pain   NAT:[id number]  Charles Tate is a 67 y.o. male coming in with complaint of left knee pain. Sates that his knee is sore due to working out at [employer]. Overall doing better.  Would state that he is 98% better.  Unable to do daily activities without thinking event states working out has only mild discomfort at the end.  Happy with the results       Past Medical History:  Diagnosis Date  . Allergy   . Arthritis    left hip   . GERD (gastroesophageal reflux disease)   . Heart murmur    adolescent rheumatic fever - developed murmur   . Hyperlipidemia   . Hypertension   . RAD (reactive airway disease)    with RTIs   Past Surgical History:  Procedure Laterality Date  . COLONOSCOPY    . colonoscopy with polypectomy  2013   Dr Ferdinand Lango, [facility]  . POLYPECTOMY    . WISDOM TOOTH EXTRACTION     Social History   Socioeconomic History  . Marital status: Married    Spouse name: Not on file  . Number of children: Not on file  . Years of education: Not on file  . Highest education level: Not on file  Occupational History  . Not on file  Social Needs  . Financial resource strain: Not on file  . Food insecurity:    Worry: Not on file    Inability: Not on file  . Transportation needs:    Medical: Not on file    Non-medical: Not on file  Tobacco Use  . Smoking status: Former Research scientist (life sciences)  . Smokeless tobacco: Never Used  . Tobacco comment: intermittent, short term smoker as a teen. Some second hand smoke in 20s & 30s  Substance and Sexual Activity  . Alcohol use: Yes    Comment:   very rarely  . Drug use: No  . Sexual activity: Not on file  Lifestyle  . Physical activity:    Days per week: Not on file    Minutes per session: Not on file  . Stress: Not on file  Relationships  . Social connections:    Talks on phone: Not on file   Gets together: Not on file    Attends religious service: Not on file    Active member of club or organization: Not on file    Attends meetings of clubs or organizations: Not on file    Relationship status: Not on file  Other Topics Concern  . Not on file  Social History Narrative   Exercise: none regularly   Allergies  Allergen Reactions  . Ciprofloxacin Hcl     Numbness, joint pain  . Cefdinir Other (See Comments)    Fever, chills, stomach cramps and diarrhea  . Lisinopril Cough   Family History  Problem Relation Age of Onset  . Arthritis Mother   . Hyperlipidemia Mother   . Lupus Mother   . Heart attack Father 22  . Hypertension Sister   . Heart attack Paternal Grandmother 32  . Diabetes Paternal Aunt   . Stroke Maternal Grandfather 28  . Cancer Paternal Grandfather        ? stomach; in 1s  . Colon cancer Paternal Grandfather   . Colon polyps Neg Hx   . Esophageal cancer Neg  Hx   . Rectal cancer Neg Hx   . Stomach cancer Neg Hx      Past medical history, social, surgical and family history all reviewed in electronic medical record.  No pertanent information unless stated regarding to the chief complaint.   Review of Systems:Review of systems updated and as accurate as of 10/24/17  No headache, visual changes, nausea, vomiting, diarrhea, constipation, dizziness, abdominal pain, skin rash, fevers, chills, night sweats, weight loss, swollen lymph nodes, body aches, joint swelling,  chest pain, shortness of breath, mood changes.  Positive muscle aches  Objective  Blood pressure 100/80, pulse 64, height 6\' 3"  (1.905 m), weight 214 lb (97.1 kg), SpO2 97 %. Systems examined below as of 10/24/17   General: No apparent distress alert and oriented x3 mood and affect normal, dressed appropriately.  HEENT: Pupils equal, extraocular movements intact  Respiratory: Patient's speak in full sentences and does not appear short of breath  Cardiovascular: No lower extremity edema,  non tender, no erythema  Skin: Warm dry intact with no signs of infection or rash on extremities or on axial skeleton.  Abdomen: Soft nontender  Neuro: Cranial nerves II through XII are intact, neurovascularly intact in all extremities with 2+ DTRs and 2+ pulses.  Lymph: No lymphadenopathy of posterior or anterior cervical chain or axillae bilaterally.  Gait normal with good balance and coordination.  MSK:  Non tender with full range of motion and good stability and symmetric strength and tone of shoulders, elbows, wrist, hip, and ankles bilaterally.  Knee: Left Normal to inspection with no erythema or effusion or obvious bony abnormalities. Palpation normal with no warmth, joint line tenderness, patellar tenderness, or condyle tenderness. ROM full in flexion and extension and lower leg rotation. Ligaments with solid consistent endpoints including ACL, PCL, LCL, MCL. Very mild positive Mcmurray's, Apley's, and Thessalonian tests. Mild painful patellar compression. Patellar glide without crepitus. Patellar and quadriceps tendons unremarkable. Hamstring and quadriceps strength is normal. Contralateral knee unremarkable   Impression and Recommendations:     This case required medical decision making of moderate complexity.      Note: This dictation was prepared with Dragon dictation along with smaller phrase technology. Any transcriptional errors that result from this process are unintentional.

## 2017-10-24 ENCOUNTER — Encounter: Payer: Self-pay | Admitting: Family Medicine

## 2017-10-24 ENCOUNTER — Ambulatory Visit: Payer: Medicare HMO | Admitting: Family Medicine

## 2017-10-24 DIAGNOSIS — S83412A Sprain of medial collateral ligament of left knee, initial encounter: Secondary | ICD-10-CM | POA: Diagnosis not present

## 2017-10-24 NOTE — Assessment & Plan Note (Signed)
He has done remarkably well.  Discussed the possibility of ultrasound and again but patient seems to be doing fine at this point and I do not think it would change medical management.  Patient will follow-up as needed

## 2017-10-24 NOTE — Patient Instructions (Signed)
Good to see you  Charles Tate is your friend.  Stay active Lower impact exercises instead of a lot of walking or jumping See me again when you need me

## 2017-11-12 ENCOUNTER — Other Ambulatory Visit: Payer: Self-pay | Admitting: Internal Medicine

## 2018-01-05 NOTE — Patient Instructions (Addendum)
Call and schedule your colonoscopy.  Tests ordered today. Your results will be released to [place] (or called to you) after review, usually within 72hours after test completion. If any changes need to be made, you will be notified at that same time.   Medications reviewed and updated.  Changes include :   none    Please followup in 6 months

## 2018-01-05 NOTE — Progress Notes (Signed)
Subjective:    Patient ID: Charles Tate, male    DOB: May 23, 1950, 67 y.o.   MRN: [id number]  HPI The patient is here for follow up.  He has been traveling a lot and playing music.  He has not been exercising regularly and has not ben eating well.   Hypertension: He is taking his medication daily. He is compliant with a low sodium diet.  He denies chest pain, palpitations, edema, shortness of breath and regular headaches. He is not exercising regularly.     Asthma:  He uses the albuterol inhaler only as needed.  He has not taken it in a while.  Allergies are controlled with flonase alone.   GERD:  He stopped taking his medication.  He denies any GERD symptoms and feels his GERD is well controlled with one of his supplements    Prediabetes:  He has not been compliant with a low sugar/carbohydrate diet.  He is not exercising regularly.   Medications and allergies reviewed with patient and updated if appropriate.  Patient Active Problem List   Diagnosis Date Noted  . Tear of MCL (medial collateral ligament) of knee, left, initial encounter 08/05/2017  . Gluteal tendinitis of left buttock 08/05/2017  . Left leg pain 07/04/2017  . Left inguinal hernia 07/04/2017  . Allergic rhinitis 12/28/2015  . H/O: rheumatic fever 12/28/2015  . Undiagnosed cardiac murmurs 12/28/2015  . Prediabetes 12/27/2015  . Rising PSA level 02/21/2014  . History of colon polyps 02/17/2014  . 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 Hyperlipidemia 01/07/2009  . Asthma 06/04/2007  . GERD 06/04/2007  . Essential hypertension 09/26/2006    Current Outpatient Medications on File Prior to Visit  Medication Sig Dispense Refill  . albuterol (PROVENTIL HFA;VENTOLIN HFA) 108 (90 Base) MCG/ACT inhaler Inhale 2 puffs into the lungs every 6 (six) hours as needed for wheezing or shortness of breath. 1 Inhaler 2  . azelastine (ASTELIN) 0.1 % nasal spray Place 1 spray into both nostrils 2  (two) times daily. Use in each nostril as directed 30 mL 12  . fluticasone (FLONASE) 50 MCG/ACT nasal spray Place 2 sprays into both nostrils daily. 16 g 6  . Multiple Vitamin (MULTIVITAMIN) capsule Take 1 capsule by mouth daily.      Marland Kitchen telmisartan-hydrochlorothiazide (MICARDIS HCT) 80-25 MG tablet TAKE 1 TABLET BY MOUTH EVERY DAY 30 tablet 5   No current facility-administered medications on file prior to visit.     Past Medical History:  Diagnosis Date  . Allergy   . Arthritis    left hip   . GERD (gastroesophageal reflux disease)   . Heart murmur    adolescent rheumatic fever - developed murmur   . Hyperlipidemia   . Hypertension   . RAD (reactive airway disease)    with RTIs    Past Surgical History:  Procedure Laterality Date  . COLONOSCOPY    . colonoscopy with polypectomy  2013   Dr Ferdinand Lango, [facility]  . POLYPECTOMY    . WISDOM TOOTH EXTRACTION      Social History   Socioeconomic History  . Marital status: Married    Spouse name: Not on file  . Number of children: Not on file  . Years of education: Not on file  . Highest education level: Not on file  Occupational History  . Not on file  Social Needs  . Financial resource strain: Not on file  . Food insecurity:    Worry: Not  on file    Inability: Not on file  . Transportation needs:    Medical: Not on file    Non-medical: Not on file  Tobacco Use  . Smoking status: Former Research scientist (life sciences)  . Smokeless tobacco: Never Used  . Tobacco comment: intermittent, short term smoker as a teen. Some second hand smoke in 20s & 30s  Substance and Sexual Activity  . Alcohol use: Yes    Comment:   very rarely  . Drug use: No  . Sexual activity: Not on file  Lifestyle  . Physical activity:    Days per week: Not on file    Minutes per session: Not on file  . Stress: Not on file  Relationships  . Social connections:    Talks on phone: Not on file    Gets together: Not on file    Attends religious service: Not on file     Active member of club or organization: Not on file    Attends meetings of clubs or organizations: Not on file    Relationship status: Not on file  Other Topics Concern  . Not on file  Social History Narrative   Exercise: none regularly    Family History  Problem Relation Age of Onset  . Arthritis Mother   . Hyperlipidemia Mother   . Lupus Mother   . Heart attack Father 57  . Hypertension Sister   . Heart attack Paternal Grandmother 98  . Diabetes Paternal Aunt   . Stroke Maternal Grandfather 60  . Cancer Paternal Grandfather        ? stomach; in 28s  . Colon cancer Paternal Grandfather   . Colon polyps Neg Hx   . Esophageal cancer Neg Hx   . Rectal cancer Neg Hx   . Stomach cancer Neg Hx     Review of Systems  Constitutional: Negative for fever.  Respiratory: Negative for cough, shortness of breath and wheezing.   Cardiovascular: Negative for chest pain, palpitations and leg swelling.  Neurological: Negative for light-headedness and headaches.       Objective:   Vitals:   01/06/18 0937  BP: 122/80  Pulse: 64  Resp: 16  Temp: 98.4 F (36.9 C)  SpO2: 97%   BP Readings from Last 3 Encounters:  01/06/18 122/80  10/24/17 100/80  09/12/17 120/74   Wt Readings from Last 3 Encounters:  01/06/18 217 lb (98.4 kg)  10/24/17 214 lb (97.1 kg)  09/12/17 214 lb (97.1 kg)   Body mass index is 27.12 kg/m.   Physical Exam    Constitutional: Appears well-developed and well-nourished. No distress.  HENT:  Head: Normocephalic and atraumatic.  Neck: Neck supple. No tracheal deviation present. No thyromegaly present.  No cervical lymphadenopathy Cardiovascular: Normal rate, regular rhythm and normal heart sounds.   No murmur heard. No carotid bruit .  No edema Pulmonary/Chest: Effort normal and breath sounds normal. No respiratory distress. No has no wheezes. No rales.  Skin: Skin is warm and dry. Not diaphoretic.  Psychiatric: Normal mood and affect. Behavior is  normal.      Assessment & Plan:    See Problem List for Assessment and Plan of chronic medical problems.

## 2018-01-06 ENCOUNTER — Ambulatory Visit (INDEPENDENT_AMBULATORY_CARE_PROVIDER_SITE_OTHER): Payer: Medicare HMO | Admitting: Internal Medicine

## 2018-01-06 ENCOUNTER — Encounter: Payer: Self-pay | Admitting: Internal Medicine

## 2018-01-06 ENCOUNTER — Other Ambulatory Visit (INDEPENDENT_AMBULATORY_CARE_PROVIDER_SITE_OTHER): Payer: Medicare HMO

## 2018-01-06 VITALS — BP 122/80 | HR 64 | Temp 98.4°F | Resp 16 | Ht 75.0 in | Wt 217.0 lb

## 2018-01-06 DIAGNOSIS — R7303 Prediabetes: Secondary | ICD-10-CM

## 2018-01-06 DIAGNOSIS — I10 Essential (primary) hypertension: Secondary | ICD-10-CM | POA: Diagnosis not present

## 2018-01-06 DIAGNOSIS — K219 Gastro-esophageal reflux disease without esophagitis: Secondary | ICD-10-CM | POA: Diagnosis not present

## 2018-01-06 DIAGNOSIS — R972 Elevated prostate specific antigen [PSA]: Secondary | ICD-10-CM

## 2018-01-06 DIAGNOSIS — J4520 Mild intermittent asthma, uncomplicated: Secondary | ICD-10-CM | POA: Diagnosis not present

## 2018-01-06 DIAGNOSIS — J309 Allergic rhinitis, unspecified: Secondary | ICD-10-CM

## 2018-01-06 LAB — COMPREHENSIVE METABOLIC PANEL
ALT: 14 U/L (ref 0–53)
AST: 15 U/L (ref 0–37)
Albumin: 4.5 g/dL (ref 3.5–5.2)
Alkaline Phosphatase: 76 U/L (ref 39–117)
BUN: 20 mg/dL (ref 6–23)
CO2: 27 mEq/L (ref 19–32)
Calcium: 9.6 mg/dL (ref 8.4–10.5)
Chloride: 100 mEq/L (ref 96–112)
Creatinine, Ser: 0.83 mg/dL (ref 0.40–1.50)
GFR: 97.77 mL/min (ref >60.00)
Glucose, Bld: 89 mg/dL (ref 70–99)
Potassium: 4.1 mEq/L (ref 3.5–5.1)
Sodium: 137 mEq/L (ref 135–145)
Total Bilirubin: 0.7 mg/dL (ref 0.2–1.2)
Total Protein: 7.4 g/dL (ref 6.0–8.3)

## 2018-01-06 LAB — HEMOGLOBIN A1C: Hgb A1c MFr Bld: 5.8 % (ref 4.6–6.5)

## 2018-01-06 NOTE — Assessment & Plan Note (Signed)
Check a1c Low sugar / carb diet Stressed regular exercise   

## 2018-01-06 NOTE — Assessment & Plan Note (Signed)
He last saw urology June 2019 and PSA returned to normal He is unsure of follow-up when his PSA should be checked again We will check PSA today-if elevated will need to see urology again Follow-up in 6 months for CPE-we will recheck PSA at that time as well

## 2018-01-06 NOTE — Assessment & Plan Note (Signed)
Stop taking the Zantac GERD has been controlled with 1 of the supplements he is taking No medication needed

## 2018-01-06 NOTE — Assessment & Plan Note (Signed)
Mild, intermittent asthma-mostly related to allergies Allergies controlled with Flonase Uses albuterol only as needed Continue above

## 2018-01-06 NOTE — Assessment & Plan Note (Signed)
Controlled with Flonase and refilled

## 2018-01-06 NOTE — Assessment & Plan Note (Signed)
BP well controlled Current regimen effective and well tolerated Continue current medications at current doses cmp  

## 2018-01-07 ENCOUNTER — Encounter: Payer: Self-pay | Admitting: Internal Medicine

## 2018-01-07 LAB — PSA, TOTAL AND FREE
PSA, % Free: 14 % (calc) — ABNORMAL LOW (ref >25)
PSA, Free: 0.5 ng/mL
PSA, Total: 3.5 ng/mL (ref <=4.0)

## 2018-01-27 DIAGNOSIS — H52223 Regular astigmatism, bilateral: Secondary | ICD-10-CM | POA: Diagnosis not present

## 2018-01-27 DIAGNOSIS — H5203 Hypermetropia, bilateral: Secondary | ICD-10-CM | POA: Diagnosis not present

## 2018-01-27 DIAGNOSIS — H524 Presbyopia: Secondary | ICD-10-CM | POA: Diagnosis not present

## 2018-03-20 ENCOUNTER — Other Ambulatory Visit: Payer: Self-pay

## 2018-03-20 ENCOUNTER — Encounter: Payer: Self-pay | Admitting: Physician Assistant

## 2018-03-20 ENCOUNTER — Ambulatory Visit: Payer: Medicare HMO | Admitting: Family Medicine

## 2018-03-20 ENCOUNTER — Ambulatory Visit (INDEPENDENT_AMBULATORY_CARE_PROVIDER_SITE_OTHER): Payer: Medicare HMO | Admitting: Physician Assistant

## 2018-03-20 VITALS — BP 130/84 | HR 73 | Temp 98.3°F | Resp 16 | Ht 75.0 in | Wt 215.0 lb

## 2018-03-20 DIAGNOSIS — J019 Acute sinusitis, unspecified: Secondary | ICD-10-CM | POA: Diagnosis not present

## 2018-03-20 MED ORDER — DOXYCYCLINE HYCLATE 100 MG PO CAPS: 100.0000 mg | ORAL_CAPSULE | Freq: Two times a day (BID) | ORAL | 0 refills | Status: DC

## 2018-03-20 MED ORDER — BENZONATATE 100 MG PO CAPS: 100.0000 mg | ORAL_CAPSULE | Freq: Three times a day (TID) | ORAL | 0 refills | Status: DC | PRN

## 2018-03-20 NOTE — Progress Notes (Signed)
Patient presents to clinic today c/o > 1 week of worsening chest congestion and severe cough that is mostly non-productive. Notes chest tenderness secondary to cough. Notes sinus pressure and PND without true sinus pain. Denies ear pain. Some tooth pain noted. Denies fever but has felt feverish. Denies recent travel or sick contact. Has been taking coricidin HBP.  Past Medical History:  Diagnosis Date  . Allergy   . Arthritis    left hip   . GERD (gastroesophageal reflux disease)   . Heart murmur    adolescent rheumatic fever - developed murmur   . Hyperlipidemia   . Hypertension   . RAD (reactive airway disease)    with RTIs    Current Outpatient Medications on File Prior to Visit  Medication Sig Dispense Refill  . azelastine (ASTELIN) 0.1 % nasal spray Place 1 spray into both nostrils 2 (two) times daily. Use in each nostril as directed 30 mL 12  . fluticasone (FLONASE) 50 MCG/ACT nasal spray Place 2 sprays into both nostrils daily. 16 g 6  . Multiple Vitamin (MULTIVITAMIN) capsule Take 1 capsule by mouth daily.      Marland Kitchen telmisartan-hydrochlorothiazide (MICARDIS HCT) 80-25 MG tablet TAKE 1 TABLET BY MOUTH EVERY DAY 30 tablet 5  . albuterol (PROVENTIL HFA;VENTOLIN HFA) 108 (90 Base) MCG/ACT inhaler Inhale 2 puffs into the lungs every 6 (six) hours as needed for wheezing or shortness of breath. (Patient not taking: Reported on 03/20/2018) 1 Inhaler 2   No current facility-administered medications on file prior to visit.     Allergies  Allergen Reactions  . Ciprofloxacin Hcl     Numbness, joint pain  . Cefdinir Other (See Comments)    Fever, chills, stomach cramps and diarrhea  . Lisinopril Cough    Family History  Problem Relation Age of Onset  . Arthritis Mother   . Hyperlipidemia Mother   . Lupus Mother   . Heart attack Father 45  . Hypertension Sister   . Heart attack Paternal Grandmother 55  . Diabetes Paternal Aunt   . Stroke Maternal Grandfather 83  . Cancer  Paternal Grandfather        ? stomach; in 52s  . Colon cancer Paternal Grandfather   . Colon polyps Neg Hx   . Esophageal cancer Neg Hx   . Rectal cancer Neg Hx   . Stomach cancer Neg Hx     Social History   Socioeconomic History  . Marital status: Married    Spouse name: Not on file  . Number of children: Not on file  . Years of education: Not on file  . Highest education level: Not on file  Occupational History  . Not on file  Social Needs  . Financial resource strain: Not on file  . Food insecurity:    Worry: Not on file    Inability: Not on file  . Transportation needs:    Medical: Not on file    Non-medical: Not on file  Tobacco Use  . Smoking status: Former Research scientist (life sciences)  . Smokeless tobacco: Never Used  . Tobacco comment: intermittent, short term smoker as a teen. Some second hand smoke in 20s & 30s  Substance and Sexual Activity  . Alcohol use: Yes    Comment:   very rarely  . Drug use: No  . Sexual activity: Not on file  Lifestyle  . Physical activity:    Days per week: Not on file    Minutes per session: Not on file  .  Stress: Not on file  Relationships  . Social connections:    Talks on phone: Not on file    Gets together: Not on file    Attends religious service: Not on file    Active member of club or organization: Not on file    Attends meetings of clubs or organizations: Not on file    Relationship status: Not on file  Other Topics Concern  . Not on file  Social History Narrative   Exercise: none regularly    Review of Systems - See HPI.  All other ROS are negative.  BP 130/84   Pulse 73   Temp 98.3 F (36.8 C) (Oral)   Resp 16   Ht 6\' 3"  (1.905 m)   Wt 215 lb (97.5 kg)   SpO2 97%   BMI 26.87 kg/m   Physical Exam Vitals signs reviewed.  Constitutional:      Appearance: Normal appearance.  HENT:     Head: Normocephalic and atraumatic.     Right Ear: Tympanic membrane normal.     Left Ear: Tympanic membrane normal.     Nose: Congestion  present.     Right Turbinates: Enlarged and swollen.     Left Turbinates: Enlarged and swollen.     Right Sinus: Maxillary sinus tenderness present.     Left Sinus: Maxillary sinus tenderness present.     Mouth/Throat:     Mouth: Mucous membranes are moist.  Eyes:     Conjunctiva/sclera: Conjunctivae normal.  Neck:     Musculoskeletal: Neck supple.  Cardiovascular:     Rate and Rhythm: Normal rate and regular rhythm.     Pulses: Normal pulses.     Heart sounds: Normal heart sounds.  Pulmonary:     Effort: Pulmonary effort is normal.     Breath sounds: Normal breath sounds.  Neurological:     Mental Status: He is alert.     Recent Results (from the past 2160 hour(s))  PSA, total and free     Status: Abnormal   Collection Time: 01/06/18 10:39 AM  Result Value Ref Range   PSA, Total 3.5 < OR = 4.0 ng/mL   PSA, Free 0.5 ng/mL   PSA, % Free 14 (L) >25 % (calc)    Comment: . PSA(ng/mL)      Free PSA(%)     Estimated(x) Probability                                      of Cancer(as%) 0-2.5              (*)               Approx. 1 2.6-4.0(1)         0-27(2)                   24(3) 4.1-10(4)          0-10                      56                    11-15                     28                    16-20  20                    21-25                     16                    >or =26                   8 >10(+)             N/A                      >50 . References:(1)Catalona et al.:Urology 60: 469-474 (2002)            (2)Catalona et al.:J.Urol 168: 922-925 (2002)               Free PSA(%)   Sensitivity(%)  Specificity(%)               < or = 25          85              19               < or = 30          93               9            (3)Catalona et al.:JAMA 277: [id number] (1997)            (4)Catalona et al.:JAMA 279: 6073-7106 (1998) . (x)These estimates vary with age, ethnicity, family     history and DRE results. (*)The  diagnostic usefulness of % Free PSA  has not been    established in patients with total PSA below 2.6 ng/mL (+)In men with PSA above 10 ng/mL, prostate cancer risk is    determined by total PSA alone. . The Total PSA value from this assay system is  standardized against the equimolar PSA standard.  The test result will be approximately 20% higher  when compared to the [facility] Total PSA  (Siemens assay). Comparison of serial PSA results  should be interpreted with this fact in mind. Marland Kitchen PSA was performed using the Beckman Coulter Immunoassay method. Values obtained from different assay methods cannot be used interchangeably. PSA levels, regardless of value, should not be interpreted as absolute evidence of the presence or absence of disease. .   Comprehensive metabolic panel     Status: None   Collection Time: 01/06/18 10:39 AM  Result Value Ref Range   Sodium 137 135 - 145 mEq/L   Potassium 4.1 3.5 - 5.1 mEq/L   Chloride 100 96 - 112 mEq/L   CO2 27 19 - 32 mEq/L   Glucose, Bld 89 70 - 99 mg/dL   BUN 20 6 - 23 mg/dL   Creatinine, Ser 0.83 0.40 - 1.50 mg/dL   Total Bilirubin 0.7 0.2 - 1.2 mg/dL   Alkaline Phosphatase 76 39 - 117 U/L   AST 15 0 - 37 U/L   ALT 14 0 - 53 U/L   Total Protein 7.4 6.0 - 8.3 g/dL   Albumin 4.5 3.5 - 5.2 g/dL   Calcium 9.6 8.4 - 10.5 mg/dL   GFR 97.77 >60.00 mL/min  Hemoglobin A1c     Status: None   Collection Time: 01/06/18 10:39 AM  Result Value Ref Range  Hgb A1c MFr Bld 5.8 4.6 - 6.5 %    Comment: Glycemic Control Guidelines for People with Diabetes:Non Diabetic:  <6%Goal of Therapy: <7%Additional Action Suggested:  >8%    Assessment/Plan: 1. Acute non-recurrent sinusitis, unspecified location Rx Doxycycline.  Increase fluids.  Rest.  Saline nasal spray.  Probiotic.  Mucinex as directed.  Humidifier in bedroom. Tessalon per orders.  Call or return to clinic if symptoms are not improving.  - doxycycline (VIBRAMYCIN) 100 MG capsule; Take 1 capsule (100 mg total) by mouth  2 (two) times daily.  Dispense: 20 capsule; Refill: 0 - benzonatate (TESSALON) 100 MG capsule; Take 1 capsule (100 mg total) by mouth 3 (three) times daily as needed for cough.  Dispense: 30 capsule; Refill: 0   Leeanne Rio, PA-C

## 2018-03-20 NOTE — Patient Instructions (Signed)
Please take antibiotic as directed.  Increase fluid intake.  Use Saline nasal spray.  Take a daily multivitamin. Use the Tessalon as directed for cough. Continue Coricidin HBP.  Place a humidifier in the bedroom.  Please call or return clinic if symptoms are not improving.  Sinusitis Sinusitis is redness, soreness, and swelling (inflammation) of the paranasal sinuses. Paranasal sinuses are air pockets within the bones of your face (beneath the eyes, the middle of the forehead, or above the eyes). In healthy paranasal sinuses, mucus is able to drain out, and air is able to circulate through them by way of your nose. However, when your paranasal sinuses are inflamed, mucus and air can become trapped. This can allow bacteria and other germs to grow and cause infection. Sinusitis can develop quickly and last only a short time (acute) or continue over a long period (chronic). Sinusitis that lasts for more than 12 weeks is considered chronic.  CAUSES  Causes of sinusitis include:  Allergies.  Structural abnormalities, such as displacement of the cartilage that separates your nostrils (deviated septum), which can decrease the air flow through your nose and sinuses and affect sinus drainage.  Functional abnormalities, such as when the small hairs (cilia) that line your sinuses and help remove mucus do not work properly or are not present. SYMPTOMS  Symptoms of acute and chronic sinusitis are the same. The primary symptoms are pain and pressure around the affected sinuses. Other symptoms include:  Upper toothache.  Earache.  Headache.  Bad breath.  Decreased sense of smell and taste.  A cough, which worsens when you are lying flat.  Fatigue.  Fever.  Thick drainage from your nose, which often is green and may contain pus (purulent).  Swelling and warmth over the affected sinuses. DIAGNOSIS  Your caregiver will perform a physical exam. During the exam, your caregiver may:  Look in your  nose for signs of abnormal growths in your nostrils (nasal polyps).  Tap over the affected sinus to check for signs of infection.  View the inside of your sinuses (endoscopy) with a special imaging device with a light attached (endoscope), which is inserted into your sinuses. If your caregiver suspects that you have chronic sinusitis, one or more of the following tests may be recommended:  Allergy tests.  Nasal culture A sample of mucus is taken from your nose and sent to a lab and screened for bacteria.  Nasal cytology A sample of mucus is taken from your nose and examined by your caregiver to determine if your sinusitis is related to an allergy. TREATMENT  Most cases of acute sinusitis are related to a viral infection and will resolve on their own within 10 days. Sometimes medicines are prescribed to help relieve symptoms (pain medicine, decongestants, nasal steroid sprays, or saline sprays).  However, for sinusitis related to a bacterial infection, your caregiver will prescribe antibiotic medicines. These are medicines that will help kill the bacteria causing the infection.  Rarely, sinusitis is caused by a fungal infection. In theses cases, your caregiver will prescribe antifungal medicine. For some cases of chronic sinusitis, surgery is needed. Generally, these are cases in which sinusitis recurs more than 3 times per year, despite other treatments. HOME CARE INSTRUCTIONS   Drink plenty of water. Water helps thin the mucus so your sinuses can drain more easily.  Use a humidifier.  Inhale steam 3 to 4 times a day (for example, sit in the bathroom with the shower running).  Apply a warm, moist  washcloth to your face 3 to 4 times a day, or as directed by your caregiver.  Use saline nasal sprays to help moisten and clean your sinuses.  Take over-the-counter or prescription medicines for pain, discomfort, or fever only as directed by your caregiver. SEEK IMMEDIATE MEDICAL CARE  IF:  You have increasing pain or severe headaches.  You have nausea, vomiting, or drowsiness.  You have swelling around your face.  You have vision problems.  You have a stiff neck.  You have difficulty breathing. MAKE SURE YOU:   Understand these instructions.  Will watch your condition.  Will get help right away if you are not doing well or get worse. Document Released: 02/19/2005 Document Revised: 05/14/2011 Document Reviewed: 03/06/2011 [facility] Patient Information 2014 [place], [place].

## 2018-05-11 ENCOUNTER — Other Ambulatory Visit: Payer: Self-pay | Admitting: Internal Medicine

## 2018-07-07 ENCOUNTER — Encounter: Payer: Medicare HMO | Admitting: Internal Medicine

## 2018-08-22 DIAGNOSIS — R69 Illness, unspecified: Secondary | ICD-10-CM | POA: Diagnosis not present

## 2018-09-18 DIAGNOSIS — D235 Other benign neoplasm of skin of trunk: Secondary | ICD-10-CM | POA: Diagnosis not present

## 2018-09-18 DIAGNOSIS — L820 Inflamed seborrheic keratosis: Secondary | ICD-10-CM | POA: Diagnosis not present

## 2018-09-18 DIAGNOSIS — L578 Other skin changes due to chronic exposure to nonionizing radiation: Secondary | ICD-10-CM | POA: Diagnosis not present

## 2018-09-18 DIAGNOSIS — D225 Melanocytic nevi of trunk: Secondary | ICD-10-CM | POA: Diagnosis not present

## 2018-11-13 ENCOUNTER — Other Ambulatory Visit: Payer: Self-pay | Admitting: Internal Medicine

## 2018-11-21 DIAGNOSIS — R69 Illness, unspecified: Secondary | ICD-10-CM | POA: Diagnosis not present

## 2018-12-04 NOTE — Progress Notes (Signed)
Subjective:    Patient ID: Charles Tate, male    DOB: May 01, 1950, 68 y.o.   MRN: [id number]  HPI He is here for a physical exam.     Medications and allergies reviewed with patient and updated if appropriate.  Patient Active Problem List   Diagnosis Date Noted  . Tear of MCL (medial collateral ligament) of knee, left, initial encounter 08/05/2017  . Gluteal tendinitis of left buttock 08/05/2017  . Left leg pain 07/04/2017  . Left inguinal hernia 07/04/2017  . Allergic rhinitis 12/28/2015  . H/O: rheumatic fever 12/28/2015  . Undiagnosed cardiac murmurs 12/28/2015  . Prediabetes 12/27/2015  . Rising PSA level 02/21/2014  . History of colon polyps 02/17/2014  . 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 Hyperlipidemia 01/07/2009  . Asthma 06/04/2007  . GERD 06/04/2007  . Essential hypertension 09/26/2006    Current Outpatient Medications on File Prior to Visit  Medication Sig Dispense Refill  . fluticasone (FLONASE) 50 MCG/ACT nasal spray Place 2 sprays into both nostrils daily. 16 g 6  . Multiple Vitamin (MULTIVITAMIN) capsule Take 1 capsule by mouth daily.      Marland Kitchen telmisartan-hydrochlorothiazide (MICARDIS HCT) 80-25 MG tablet Take 1 tablet by mouth daily. Due for office visit for more refills 30 tablet 0  . azelastine (ASTELIN) 0.1 % nasal spray Place 1 spray into both nostrils 2 (two) times daily. Use in each nostril as directed (Patient not taking: Reported on 12/05/2018) 30 mL 12   No current facility-administered medications on file prior to visit.     Past Medical History:  Diagnosis Date  . Allergy   . Arthritis    left hip   . GERD (gastroesophageal reflux disease)   . Heart murmur    adolescent rheumatic fever - developed murmur   . Hyperlipidemia   . Hypertension   . RAD (reactive airway disease)    with RTIs    Past Surgical History:  Procedure Laterality Date  . COLONOSCOPY    . colonoscopy with polypectomy  2013   Dr  Ferdinand Lango, [facility]  . POLYPECTOMY    . WISDOM TOOTH EXTRACTION      Social History   Socioeconomic History  . Marital status: Married    Spouse name: Not on file  . Number of children: Not on file  . Years of education: Not on file  . Highest education level: Not on file  Occupational History  . Not on file  Social Needs  . Financial resource strain: Not on file  . Food insecurity    Worry: Not on file    Inability: Not on file  . Transportation needs    Medical: Not on file    Non-medical: Not on file  Tobacco Use  . Smoking status: Former Research scientist (life sciences)  . Smokeless tobacco: Never Used  . Tobacco comment: intermittent, short term smoker as a teen. Some second hand smoke in 20s & 30s  Substance and Sexual Activity  . Alcohol use: Yes    Comment:   very rarely  . Drug use: No  . Sexual activity: Not on file  Lifestyle  . Physical activity    Days per week: Not on file    Minutes per session: Not on file  . Stress: Not on file  Relationships  . Social Herbalist on phone: Not on file    Gets together: Not on file    Attends religious service: Not on file  Active member of club or organization: Not on file    Attends meetings of clubs or organizations: Not on file    Relationship status: Not on file  Other Topics Concern  . Not on file  Social History Narrative   Exercise: none regularly    Family History  Problem Relation Age of Onset  . Arthritis Mother   . Hyperlipidemia Mother   . Lupus Mother   . Heart attack Father 46  . Hypertension Sister   . Heart attack Paternal Grandmother 56  . Diabetes Paternal Aunt   . Stroke Maternal Grandfather 53  . Cancer Paternal Grandfather        ? stomach; in 57s  . Colon cancer Paternal Grandfather   . Colon polyps Neg Hx   . Esophageal cancer Neg Hx   . Rectal cancer Neg Hx   . Stomach cancer Neg Hx     Review of Systems  Constitutional: Negative for chills and fever.  Eyes: Negative for visual  disturbance.  Respiratory: Negative for cough, shortness of breath and wheezing.   Cardiovascular: Negative for chest pain, palpitations and leg swelling.  Gastrointestinal: Negative for abdominal pain, blood in stool, constipation, diarrhea and nausea.       No gerd  Genitourinary: Positive for difficulty urinating (intermittent slow stream) and frequency (sometimes at night). Negative for dysuria and hematuria.  Musculoskeletal: Negative for arthralgias and back pain.  Skin: Negative for color change and rash.  Neurological: Negative for light-headedness and headaches.  Psychiatric/Behavioral: Negative for dysphoric mood. The patient is not nervous/anxious.        Objective:   Vitals:   12/05/18 1324  BP: 138/78  Pulse: 69  Temp: 98 F (36.7 C)  SpO2: 97%   Filed Weights   12/05/18 1324  Weight: 213 lb (96.6 kg)   Body mass index is 26.62 kg/m.  BP Readings from Last 3 Encounters:  12/05/18 138/78  03/20/18 130/84  01/06/18 122/80    Wt Readings from Last 3 Encounters:  12/05/18 213 lb (96.6 kg)  03/20/18 215 lb (97.5 kg)  01/06/18 217 lb (98.4 kg)     Physical Exam Constitutional: He appears well-developed and well-nourished. No distress.  HENT:  Head: Normocephalic and atraumatic.  Right Ear: External ear normal.  Left Ear: External ear normal.  Mouth/Throat: Oropharynx is clear and moist.  Normal ear canals and TM b/l  Eyes: Conjunctivae and EOM are normal.  Neck: Neck supple. No tracheal deviation present. No thyromegaly present.  No carotid bruit  Cardiovascular: Normal rate, regular rhythm, normal heart sounds and intact distal pulses.   No murmur heard. Pulmonary/Chest: Effort normal and breath sounds normal. No respiratory distress. He has no wheezes. He has no rales.  Abdominal: Soft. He exhibits no distension. There is no tenderness.  Genitourinary: deferred  Musculoskeletal: He exhibits no edema.  Lymphadenopathy:   He has no cervical  adenopathy.  Skin: Skin is warm and dry. He is not diaphoretic.  Psychiatric: He has a normal mood and affect. His behavior is normal.         Assessment & Plan:   Physical exam: Screening blood work  ordered Immunizations  Pneumovax today, discussed shingrix Colonoscopy   Due - referred  Eye exams   Up to date  Exercise   walking Weight  Mildly overweight sees derm annually Substance abuse   none  See Problem List for Assessment and Plan of chronic medical problems.   FU in 6 months

## 2018-12-04 NOTE — Patient Instructions (Addendum)
Tests ordered today. Your results will be released to [place] (or called to you) after review.  If any changes need to be made, you will be notified at that same time.  All other Health Maintenance issues reviewed.   All recommended immunizations and age-appropriate screenings are up-to-date or discussed.  Pneumovax immunization administered today.   Medications reviewed and updated.  Changes include :   none  Your prescription(s) have been submitted to your pharmacy. Please take as directed and contact our office if you believe you are having problem(s) with the medication(s).   Please followup in 6 months    Health Maintenance, Male Adopting a healthy lifestyle and getting preventive care are important in promoting health and wellness. Ask your health care provider about:  The right schedule for you to have regular tests and exams.  Things you can do on your own to prevent diseases and keep yourself healthy. What should I know about diet, weight, and exercise? Eat a healthy diet   Eat a diet that includes plenty of vegetables, fruits, low-fat dairy products, and lean protein.  Do not eat a lot of foods that are high in solid fats, added sugars, or sodium. Maintain a healthy weight Body mass index (BMI) is a measurement that can be used to identify possible weight problems. It estimates body fat based on height and weight. Your health care provider can help determine your BMI and help you achieve or maintain a healthy weight. Get regular exercise Get regular exercise. This is one of the most important things you can do for your health. Most adults should:  Exercise for at least 150 minutes each week. The exercise should increase your heart rate and make you sweat (moderate-intensity exercise).  Do strengthening exercises at least twice a week. This is in addition to the moderate-intensity exercise.  Spend less time sitting. Even light physical activity can be beneficial.  Watch cholesterol and blood lipids Have your blood tested for lipids and cholesterol at 69 years of age, then have this test every 5 years. You may need to have your cholesterol levels checked more often if:  Your lipid or cholesterol levels are high.  You are older than 68 years of age.  You are at high risk for heart disease. What should I know about cancer screening? Many types of cancers can be detected early and may often be prevented. Depending on your health history and family history, you may need to have cancer screening at various ages. This may include screening for:  Colorectal cancer.  Prostate cancer.  Skin cancer.  Lung cancer. What should I know about heart disease, diabetes, and high blood pressure? Blood pressure and heart disease  High blood pressure causes heart disease and increases the risk of stroke. This is more likely to develop in people who have high blood pressure readings, are of African descent, or are overweight.  Talk with your health care provider about your target blood pressure readings.  Have your blood pressure checked: ? Every 3-5 years if you are 34-22 years of age. ? Every year if you are 58 years old or older.  If you are between the ages of 30 and 45 and are a current or former smoker, ask your health care provider if you should have a one-time screening for abdominal aortic aneurysm (AAA). Diabetes Have regular diabetes screenings. This checks your fasting blood sugar level. Have the screening done:  Once every three years after age 40 if you are at  a normal weight and have a low risk for diabetes.  More often and at a younger age if you are overweight or have a high risk for diabetes. What should I know about preventing infection? Hepatitis B If you have a higher risk for hepatitis B, you should be screened for this virus. Talk with your health care provider to find out if you are at risk for hepatitis B infection. Hepatitis C  Blood testing is recommended for:  Everyone born from [birth year] through 1966.  Anyone with known risk factors for hepatitis C. Sexually transmitted infections (STIs)  You should be screened each year for STIs, including gonorrhea and chlamydia, if: ? You are sexually active and are younger than 69 years of age. ? You are older than 69 years of age and your health care provider tells you that you are at risk for this type of infection. ? Your sexual activity has changed since you were last screened, and you are at increased risk for chlamydia or gonorrhea. Ask your health care provider if you are at risk.  Ask your health care provider about whether you are at high risk for HIV. Your health care provider may recommend a prescription medicine to help prevent HIV infection. If you choose to take medicine to prevent HIV, you should first get tested for HIV. You should then be tested every 3 months for as long as you are taking the medicine. Follow these instructions at home: Lifestyle  Do not use any products that contain nicotine or tobacco, such as cigarettes, e-cigarettes, and chewing tobacco. If you need help quitting, ask your health care provider.  Do not use street drugs.  Do not share needles.  Ask your health care provider for help if you need support or information about quitting drugs. Alcohol use  Do not drink alcohol if your health care provider tells you not to drink.  If you drink alcohol: ? Limit how much you have to 0-2 drinks a day. ? Be aware of how much alcohol is in your drink. In the [place] one drink equals one 12 oz bottle of beer (355 mL), one 5 oz glass of wine (148 mL), or one 1 oz glass of hard liquor (44 mL). General instructions  Schedule regular health, dental, and eye exams.  Stay current with your vaccines.  Tell your health care provider if: ? You often feel depressed. ? You have ever been abused or do not feel safe at home. Summary  Adopting a  healthy lifestyle and getting preventive care are important in promoting health and wellness.  Follow your health care provider's instructions about healthy diet, exercising, and getting tested or screened for diseases.  Follow your health care provider's instructions on monitoring your cholesterol and blood pressure. This information is not intended to replace advice given to you by your health care provider. Make sure you discuss any questions you have with your health care provider. Document Released: 08/18/2007 Document Revised: 02/12/2018 Document Reviewed: 02/12/2018 Elsevier Patient Education  2020 [employer].

## 2018-12-05 ENCOUNTER — Other Ambulatory Visit (INDEPENDENT_AMBULATORY_CARE_PROVIDER_SITE_OTHER): Payer: Medicare HMO

## 2018-12-05 ENCOUNTER — Encounter: Payer: Self-pay | Admitting: Internal Medicine

## 2018-12-05 ENCOUNTER — Ambulatory Visit (INDEPENDENT_AMBULATORY_CARE_PROVIDER_SITE_OTHER): Payer: Medicare HMO | Admitting: Internal Medicine

## 2018-12-05 ENCOUNTER — Other Ambulatory Visit: Payer: Self-pay

## 2018-12-05 VITALS — BP 138/78 | HR 69 | Temp 98.0°F | Ht 75.0 in | Wt 213.0 lb

## 2018-12-05 DIAGNOSIS — R972 Elevated prostate specific antigen [PSA]: Secondary | ICD-10-CM | POA: Diagnosis not present

## 2018-12-05 DIAGNOSIS — Z1211 Encounter for screening for malignant neoplasm of colon: Secondary | ICD-10-CM | POA: Diagnosis not present

## 2018-12-05 DIAGNOSIS — R7303 Prediabetes: Secondary | ICD-10-CM

## 2018-12-05 DIAGNOSIS — Z0000 Encounter for general adult medical examination without abnormal findings: Secondary | ICD-10-CM | POA: Diagnosis not present

## 2018-12-05 DIAGNOSIS — I10 Essential (primary) hypertension: Secondary | ICD-10-CM | POA: Diagnosis not present

## 2018-12-05 DIAGNOSIS — E785 Hyperlipidemia, unspecified: Secondary | ICD-10-CM

## 2018-12-05 DIAGNOSIS — Z23 Encounter for immunization: Secondary | ICD-10-CM

## 2018-12-05 LAB — CBC WITH DIFFERENTIAL/PLATELET
Basophils Absolute: 0 10*3/uL (ref 0.0–0.1)
Basophils Relative: 0.8 % (ref 0.0–3.0)
Eosinophils Absolute: 0 10*3/uL (ref 0.0–0.7)
Eosinophils Relative: 0.8 % (ref 0.0–5.0)
HCT: 44 % (ref 39.0–52.0)
Hemoglobin: 15.2 g/dL (ref 13.0–17.0)
Lymphocytes Relative: 18.3 % (ref 12.0–46.0)
Lymphs Abs: 1.1 10*3/uL (ref 0.7–4.0)
MCHC: 34.5 g/dL (ref 30.0–36.0)
MCV: 87.2 fl (ref 78.0–100.0)
Monocytes Absolute: 0.5 10*3/uL (ref 0.1–1.0)
Monocytes Relative: 8.8 % (ref 3.0–12.0)
Neutro Abs: 4.2 10*3/uL (ref 1.4–7.7)
Neutrophils Relative %: 71.3 % (ref 43.0–77.0)
Platelets: 214 10*3/uL (ref 150.0–400.0)
RBC: 5.05 Mil/uL (ref 4.22–5.81)
RDW: 12.9 % (ref 11.5–15.5)
WBC: 5.9 10*3/uL (ref 4.0–10.5)

## 2018-12-05 LAB — LIPID PANEL
Cholesterol: 217 mg/dL — ABNORMAL HIGH (ref 0–200)
HDL: 33 mg/dL — ABNORMAL LOW (ref >39.00)
NonHDL: 183.76
Total CHOL/HDL Ratio: 7
Triglycerides: 210 mg/dL — ABNORMAL HIGH (ref 0.0–149.0)
VLDL: 42 mg/dL — ABNORMAL HIGH (ref 0.0–40.0)

## 2018-12-05 LAB — COMPREHENSIVE METABOLIC PANEL
ALT: 18 U/L (ref 0–53)
AST: 17 U/L (ref 0–37)
Albumin: 4.4 g/dL (ref 3.5–5.2)
Alkaline Phosphatase: 75 U/L (ref 39–117)
BUN: 22 mg/dL (ref 6–23)
CO2: 28 mEq/L (ref 19–32)
Calcium: 9.6 mg/dL (ref 8.4–10.5)
Chloride: 100 mEq/L (ref 96–112)
Creatinine, Ser: 0.74 mg/dL (ref 0.40–1.50)
GFR: 104.74 mL/min (ref >60.00)
Glucose, Bld: 99 mg/dL (ref 70–99)
Potassium: 4 mEq/L (ref 3.5–5.1)
Sodium: 137 mEq/L (ref 135–145)
Total Bilirubin: 0.5 mg/dL (ref 0.2–1.2)
Total Protein: 7.1 g/dL (ref 6.0–8.3)

## 2018-12-05 LAB — PSA, MEDICARE: PSA: 3.45 ng/ml (ref 0.10–4.00)

## 2018-12-05 LAB — HEMOGLOBIN A1C: Hgb A1c MFr Bld: 6.1 % (ref 4.6–6.5)

## 2018-12-05 LAB — TSH: TSH: 1.29 u[IU]/mL (ref 0.35–4.50)

## 2018-12-05 LAB — LDL CHOLESTEROL, DIRECT: Direct LDL: 161 mg/dL

## 2018-12-05 NOTE — Addendum Note (Signed)
Addended by: Cresenciano Lick on: 12/05/2018 02:16 PM   Modules accepted: Orders

## 2018-12-05 NOTE — Assessment & Plan Note (Signed)
Due to see urology I will order psa

## 2018-12-05 NOTE — Assessment & Plan Note (Signed)
Check a1c Low sugar / carb diet Stressed regular exercise   

## 2018-12-05 NOTE — Assessment & Plan Note (Signed)
Borderline high BP controlled Current regimen effective and well tolerated Continue current medications at current doses cmp

## 2018-12-05 NOTE — Assessment & Plan Note (Addendum)
Check lipid panel, tsh Diet controlled Regular exercise and healthy diet encouraged

## 2018-12-06 ENCOUNTER — Encounter: Payer: Self-pay | Admitting: Internal Medicine

## 2018-12-10 ENCOUNTER — Encounter: Payer: Self-pay | Admitting: Internal Medicine

## 2018-12-11 ENCOUNTER — Other Ambulatory Visit: Payer: Self-pay | Admitting: Internal Medicine

## 2019-01-25 ENCOUNTER — Other Ambulatory Visit: Payer: Self-pay | Admitting: Physician Assistant

## 2019-01-25 DIAGNOSIS — J0130 Acute sphenoidal sinusitis, unspecified: Secondary | ICD-10-CM

## 2019-01-26 ENCOUNTER — Encounter: Payer: Medicare HMO | Admitting: Internal Medicine

## 2019-01-26 ENCOUNTER — Other Ambulatory Visit: Payer: Self-pay | Admitting: Internal Medicine

## 2019-01-26 DIAGNOSIS — J0130 Acute sphenoidal sinusitis, unspecified: Secondary | ICD-10-CM

## 2019-01-26 MED ORDER — FLUTICASONE PROPIONATE 50 MCG/ACT NA SUSP: 2.0000 | Freq: Every day | NASAL | 6 refills | Status: DC

## 2019-01-26 NOTE — Telephone Encounter (Signed)
Requested medication (s) are due for refill today: yes  Requested medication (s) are on the active medication list: yes  Last refill:  06/19/2017  Future visit scheduled: no  Notes to clinic:  Last filled by different provider than pcp   Requested Prescriptions  Pending Prescriptions Disp Refills   fluticasone (FLONASE) 50 MCG/ACT nasal spray 16 g 6    Sig: Place 2 sprays into both nostrils daily.     Ear, Nose, and Throat: Nasal Preparations - Corticosteroids Passed - 01/26/2019 10:33 AM      Passed - Valid encounter within last 12 months    Recent Outpatient Visits          1 month ago Preventative health care   [place], [place]   10 months ago Acute non-recurrent sinusitis, unspecified location   [facility] [place], Luanna Cole, [place]   1 year ago Essential hypertension   [place], MD   1 year ago Tear of MCL (medial collateral ligament) of knee, left, initial encounter   [place], [place], DO   1 year ago Chronic pain of left knee   [place], [place], DO

## 2019-01-26 NOTE — Telephone Encounter (Signed)
Medication Refill - Medication: fluticasone (FLONASE) 50 MCG/ACT nasal spray    Has the patient contacted their pharmacy? Yes.   (Agent: If no, request that the patient contact the pharmacy for the refill.) (Agent: If yes, when and what did the pharmacy advise?)Advised to contact PCP  Preferred Pharmacy (with phone number or street name):  [facility], [place] [phone] (Phone) [phone] (Fax)     Agent: Please be advised that RX refills may take up to 3 business days. We ask that you follow-up with your pharmacy.

## 2019-03-27 ENCOUNTER — Encounter: Payer: Self-pay | Admitting: Internal Medicine

## 2019-04-16 ENCOUNTER — Ambulatory Visit (AMBULATORY_SURGERY_CENTER): Payer: Self-pay | Admitting: *Deleted

## 2019-04-16 ENCOUNTER — Other Ambulatory Visit: Payer: Self-pay

## 2019-04-16 VITALS — Temp 97.6°F | Ht 75.0 in | Wt 211.0 lb

## 2019-04-16 DIAGNOSIS — Z01818 Encounter for other preprocedural examination: Secondary | ICD-10-CM

## 2019-04-16 DIAGNOSIS — Z86010 Personal history of colonic polyps: Secondary | ICD-10-CM

## 2019-04-16 MED ORDER — NA SULFATE-K SULFATE-MG SULF 17.5-3.13-1.6 GM/177ML PO SOLN: 1.0000 | Freq: Once | ORAL | 0 refills | Status: AC

## 2019-04-16 NOTE — Progress Notes (Signed)

## 2019-04-27 ENCOUNTER — Ambulatory Visit (INDEPENDENT_AMBULATORY_CARE_PROVIDER_SITE_OTHER): Payer: Medicare HMO

## 2019-04-27 ENCOUNTER — Other Ambulatory Visit: Payer: Self-pay | Admitting: Internal Medicine

## 2019-04-27 DIAGNOSIS — Z1159 Encounter for screening for other viral diseases: Secondary | ICD-10-CM | POA: Diagnosis not present

## 2019-04-28 LAB — SARS CORONAVIRUS 2 (TAT 6-24 HRS): SARS Coronavirus 2: NEGATIVE

## 2019-04-30 ENCOUNTER — Encounter: Payer: Self-pay | Admitting: Internal Medicine

## 2019-04-30 ENCOUNTER — Ambulatory Visit (AMBULATORY_SURGERY_CENTER): Payer: Medicare HMO | Admitting: Internal Medicine

## 2019-04-30 ENCOUNTER — Other Ambulatory Visit: Payer: Self-pay

## 2019-04-30 VITALS — BP 123/75 | HR 59 | Temp 97.1°F | Resp 13 | Ht 75.0 in | Wt 211.0 lb

## 2019-04-30 DIAGNOSIS — D123 Benign neoplasm of transverse colon: Secondary | ICD-10-CM

## 2019-04-30 DIAGNOSIS — D122 Benign neoplasm of ascending colon: Secondary | ICD-10-CM

## 2019-04-30 DIAGNOSIS — Z86010 Personal history of colonic polyps: Secondary | ICD-10-CM

## 2019-04-30 MED ORDER — SODIUM CHLORIDE 0.9 % IV SOLN: 500.0000 mL | Freq: Once | INTRAVENOUS | Status: DC

## 2019-04-30 NOTE — Progress Notes (Signed)
Called to room to assist during endoscopic procedure.  Patient ID and intended procedure confirmed with present staff. Received instructions for my participation in the procedure from the performing physician.  

## 2019-04-30 NOTE — Progress Notes (Signed)
Temp LC Vitals CW   

## 2019-04-30 NOTE — Patient Instructions (Signed)
Handouts on polyps, diverticulosis, and hemorrhoids given to you today  Await pathology results.  Await call from office to set up repeat colonoscopy    YOU HAD AN ENDOSCOPIC PROCEDURE TODAY AT [facility]:   Refer to the procedure report that was given to you for any specific questions about what was found during the examination.  If the procedure report does not answer your questions, please call your gastroenterologist to clarify.  If you requested that your care partner not be given the details of your procedure findings, then the procedure report has been included in a sealed envelope for you to review at your convenience later.  YOU SHOULD EXPECT: Some feelings of bloating in the abdomen. Passage of more gas than usual.  Walking can help get rid of the air that was put into your GI tract during the procedure and reduce the bloating. If you had a lower endoscopy (such as a colonoscopy or flexible sigmoidoscopy) you may notice spotting of blood in your stool or on the toilet paper. If you underwent a bowel prep for your procedure, you may not have a normal bowel movement for a few days.  Please Note:  You might notice some irritation and congestion in your nose or some drainage.  This is from the oxygen used during your procedure.  There is no need for concern and it should clear up in a day or so.  SYMPTOMS TO REPORT IMMEDIATELY:   Following lower endoscopy (colonoscopy or flexible sigmoidoscopy):  Excessive amounts of blood in the stool  Significant tenderness or worsening of abdominal pains  Swelling of the abdomen that is new, acute  Fever of 100F or higher  For urgent or emergent issues, a gastroenterologist can be reached at any hour by calling [phone].   DIET:  We do recommend a small meal at first, but then you may proceed to your regular diet.  Drink plenty of fluids but you should avoid alcoholic beverages for 24 hours.  ACTIVITY:  You should plan to  take it easy for the rest of today and you should NOT DRIVE or use heavy machinery until tomorrow (because of the sedation medicines used during the test).    FOLLOW UP: Our staff will call the number listed on your records 48-72 hours following your procedure to check on you and address any questions or concerns that you may have regarding the information given to you following your procedure. If we do not reach you, we will leave a message.  We will attempt to reach you two times.  During this call, we will ask if you have developed any symptoms of COVID 19. If you develop any symptoms (ie: fever, flu-like symptoms, shortness of breath, cough etc.) before then, please call [phone].  If you test positive for Covid 19 in the 2 weeks post procedure, please call and report this information to [place].    If any biopsies were taken you will be contacted by phone or by letter within the next 1-3 weeks.  Please call us at [phone] if you have not heard about the biopsies in 3 weeks.    SIGNATURES/CONFIDENTIALITY: You and/or your care partner have signed paperwork which will be entered into your electronic medical record.  These signatures attest to the fact that that the information above on your After Visit Summary has been reviewed and is understood.  Full responsibility of the confidentiality of this discharge information lies with you and/or your care-partner.

## 2019-04-30 NOTE — Op Note (Signed)
[facility] Patient Name: Charles Tate Procedure Date: 04/30/2019 9:56 AM MRN: [id number] Endoscopist: Jerene Bears , MD Age: 69 Referring MD:  Date of Birth: 03-Aug-1950 Gender: Male Account #: [id number] Procedure:                Colonoscopy Indications:              Screening for colorectal malignant neoplasm, Last                            colonoscopy: 2013 (less than good prep) Medicines:                Monitored Anesthesia Care Procedure:                Pre-Anesthesia Assessment:                           - Prior to the procedure, a History and Physical                            was performed, and patient medications and                            allergies were reviewed. The patient's tolerance of                            previous anesthesia was also reviewed. The risks                            and benefits of the procedure and the sedation                            options and risks were discussed with the patient.                            All questions were answered, and informed consent                            was obtained. Prior Anticoagulants: The patient has                            taken no previous anticoagulant or antiplatelet                            agents. ASA Grade Assessment: II - A patient with                            mild systemic disease. After reviewing the risks                            and benefits, the patient was deemed in                            satisfactory condition to undergo the procedure.  After obtaining informed consent, the colonoscope                            was passed under direct vision. Throughout the                            procedure, the patient's blood pressure, pulse, and                            oxygen saturations were monitored continuously. The                            Colonoscope was introduced through the anus and                            advanced to the cecum,  identified by appendiceal                            orifice and ileocecal valve. The colonoscopy was                            performed without difficulty. The patient tolerated                            the procedure well. The quality of the bowel                            preparation was good with 2 day prep. The ileocecal                            valve, appendiceal orifice, and rectum were                            photographed. Scope In: 10:01:42 AM Scope Out: 10:27:07 AM Scope Withdrawal Time: 0 hours 21 minutes 32 seconds  Total Procedure Duration: 0 hours 25 minutes 25 seconds  Findings:                 The digital rectal exam was normal.                           The sigmoid colon and descending colon were                            redundant.                           An approximate 40 mm polyp was found in the                            ascending colon. The polyp was sessile and                            laterally spreading. It begins 2 folds distal to  the IC valve and has the typical appearance of a                            sessile serrated polyp. Polypectomy was not                            attempted due to polyp size and need for EMR in the                            outpatient hospital setting. Biopsy was not                            performed as I did not want to increase difficulty                            of future resection by EMR (though it is clearly                            dysplastic).                           Three sessile polyps were found in the ascending                            colon. The polyps were 2 to 6 mm in size. These                            polyps were removed with a cold snare. Resection                            and retrieval were complete.                           Two sessile polyps were found in the transverse                            colon. The polyps were 4 to 6 mm in size. These                             polyps were removed with a cold snare. Resection                            and retrieval were complete.                           Multiple large-mouthed diverticula were found in                            the proximal sigmoid colon and descending colon.                           Internal hemorrhoids were found during retroflexion. Complications:  No immediate complications. Estimated Blood Loss:     Estimated blood loss was minimal. Impression:               - One 40 mm polyp in the ascending colon. Resection                            not attempted.                           - Three 2 to 6 mm polyps in the ascending colon,                            removed with a cold snare. Resected and retrieved.                           - Two 4 to 6 mm polyps in the transverse colon,                            removed with a cold snare. Resected and retrieved.                           - Diverticulosis in the proximal sigmoid colon and                            in the descending colon.                           - Internal hemorrhoids. Recommendation:           - Patient has a contact number available for                            emergencies. The signs and symptoms of potential                            delayed complications were discussed with the                            patient. Return to normal activities tomorrow.                            Written discharge instructions were provided to the                            patient.                           - Resume previous diet.                           - Continue present medications.                           - Await pathology results.                           -  Repeat colonoscopy is recommended in the                            outpatient hospital setting for EMR. I will ask Dr.                            Rush Landmark to review and consider EMR. Jerene Bears, MD 04/30/2019 10:44:31 AM This report has been signed  electronically.

## 2019-04-30 NOTE — Progress Notes (Signed)
Pt tolerated well. VSS. Awake and to recovery. 

## 2019-04-30 NOTE — Progress Notes (Signed)
Pt's states no medical or surgical changes since previsit or office visit. 

## 2019-05-04 ENCOUNTER — Telehealth: Payer: Self-pay

## 2019-05-04 ENCOUNTER — Encounter: Payer: Self-pay | Admitting: Internal Medicine

## 2019-05-04 NOTE — Telephone Encounter (Signed)
Left message on machine to call back  

## 2019-05-04 NOTE — Telephone Encounter (Signed)
  Follow up Call-  Call back number 04/30/2019  Post procedure Call Back phone  # [phone]  Permission to leave phone message Yes  Some recent data might be hidden     Patient questions:  Do you have a fever, pain , or abdominal swelling? No. Pain Score  0 *  Have you tolerated food without any problems? Yes.    Have you been able to return to your normal activities? Yes.    Do you have any questions about your discharge instructions: Diet   No. Medications  No. Follow up visit  No.  Do you have questions or concerns about your Care? No.  Actions: * If pain score is 4 or above: No action needed, pain <4. 1. Have you developed a fever since your procedure? no  2.   Have you had an respiratory symptoms (SOB or cough) since your procedure? no  3.   Have you tested positive for COVID 19 since your procedure no  4.   Have you had any family members/close contacts diagnosed with the COVID 19 since your procedure?  no   If yes to any of these questions please route to Joylene John, RN and Alphonsa Gin, Therapist, sports.

## 2019-05-04 NOTE — Telephone Encounter (Signed)
-----   Message from Irving Copas., MD sent at 05/02/2019  4:42 AM EST ----- JMP, Would be happy to be available for EMR attempt.Jacky Hartung, please schedule patient for clinic visit to discuss EMR attempt and subsequently have a colonoscopy with EMR (90-minute slot) available and offered to him if he desires oral if he wants to wait until he is seen in clinic.Please let Dr. Hilarie Fredrickson and I know the timing of clinic and/or the timing of colonoscopy.Thanks.GM ----- Message ----- From: Jerene Bears, MD Sent: 04/30/2019  12:15 PM EST To: Irving Copas., MD  Gabe This is a guy I did colon for today.  Has laterally spreading SSP-appearing polyp in ascending coon.  I did not biopsy it I did recommended EMR in outpatient hospital setting so he is aware. Could you help with EMR? Thanks [employer]

## 2019-05-04 NOTE — Telephone Encounter (Signed)
An appt has been made to see Dr Rush Landmark on 3/26 at 230 pm Left message on machine to call back

## 2019-05-05 NOTE — Telephone Encounter (Signed)
The patient has been notified of this information and all questions answered.

## 2019-05-05 NOTE — Telephone Encounter (Signed)
Left message on machine to call back  

## 2019-05-29 ENCOUNTER — Other Ambulatory Visit (INDEPENDENT_AMBULATORY_CARE_PROVIDER_SITE_OTHER): Payer: Medicare HMO

## 2019-05-29 ENCOUNTER — Encounter: Payer: Self-pay | Admitting: Gastroenterology

## 2019-05-29 ENCOUNTER — Ambulatory Visit: Payer: Medicare HMO | Admitting: Gastroenterology

## 2019-05-29 VITALS — BP 120/70 | HR 71 | Temp 98.5°F | Ht 75.0 in | Wt 209.0 lb

## 2019-05-29 DIAGNOSIS — Z01818 Encounter for other preprocedural examination: Secondary | ICD-10-CM | POA: Diagnosis not present

## 2019-05-29 DIAGNOSIS — Z86010 Personal history of colonic polyps: Secondary | ICD-10-CM | POA: Diagnosis not present

## 2019-05-29 LAB — CBC
HCT: 42.7 % (ref 39.0–52.0)
Hemoglobin: 14.8 g/dL (ref 13.0–17.0)
MCHC: 34.6 g/dL (ref 30.0–36.0)
MCV: 87.3 fl (ref 78.0–100.0)
Platelets: 200 10*3/uL (ref 150.0–400.0)
RBC: 4.89 Mil/uL (ref 4.22–5.81)
RDW: 12.9 % (ref 11.5–15.5)
WBC: 4 10*3/uL (ref 4.0–10.5)

## 2019-05-29 LAB — BASIC METABOLIC PANEL
BUN: 18 mg/dL (ref 6–23)
CO2: 29 mEq/L (ref 19–32)
Calcium: 9.2 mg/dL (ref 8.4–10.5)
Chloride: 98 mEq/L (ref 96–112)
Creatinine, Ser: 0.82 mg/dL (ref 0.40–1.50)
GFR: 92.91 mL/min (ref >60.00)
Glucose, Bld: 89 mg/dL (ref 70–99)
Potassium: 3.7 mEq/L (ref 3.5–5.1)
Sodium: 134 mEq/L — ABNORMAL LOW (ref 135–145)

## 2019-05-29 LAB — PROTIME-INR
INR: 1.1 ratio — ABNORMAL HIGH (ref 0.8–1.0)
Prothrombin Time: 12.5 s (ref 9.6–13.1)

## 2019-05-29 MED ORDER — SUPREP BOWEL PREP KIT 17.5-3.13-1.6 GM/177ML PO SOLN: 1.0000 | ORAL | 0 refills | Status: DC

## 2019-05-29 NOTE — Progress Notes (Signed)
[place] VISIT   Primary Care Provider [name], MD [place] [address] [phone]  Referring Provider Dr. Hilarie Fredrickson  Patient Profile: Charles Tate is a 69 y.o. male with a pmh significant for GERD, hypertension, hyperlipidemia, reactive airway disease, osteoarthritis, diverticulosis, colon polyps.  The patient presents to the [facility] [place] [facility] for an evaluation and management of problem(s) noted below:  Problem List 1. Hx of colonic polyp   2. Encounter for other preprocedural examination     History of Present Illness The patient underwent a screening colonoscopy in February 2021 with Dr. Hilarie Fredrickson.  He was found to have multiple polyps in the ascending and transverse colon that were resected and returned as tubular adenomas.  He was found to have a large 40 mm polyp in the ascending colon that was not removed.  He also had diverticulosis and internal hemorrhoids.  It is for the reason of the large colon polyp that the patient is referred for discussion of potential advanced resection techniques.  The patient does not have any other significant GI symptoms at this time.  Patient does not take nonsteroidals or BC/Goody powders on a regular basis and is not on any anticoagulation.  GI Review of Systems Positive as above Negative for dysphagia, odynophagia, abdominal pain, nausea, vomiting, change in bowel habits, melena, hematochezia  Review of Systems General: Denies fevers/chills/weight loss HEENT: Denies oral lesions Cardiovascular: Denies chest pain Pulmonary: Denies shortness of breath Gastroenterological: See HPI Genitourinary: Denies darkened urine Hematological: Denies easy bruising/bleeding Dermatological: Denies jaundice Psychological: Mood is stable   Medications Current Outpatient Medications  Medication Sig Dispense Refill  . fluticasone (FLONASE) 50 MCG/ACT nasal spray Place 2 sprays into both  nostrils daily. 16 g 6  . Misc Natural Products (SUPER GREENS PO) Take by mouth.    . Multiple Vitamin (MULTIVITAMIN) capsule Take 1 capsule by mouth daily.      Marland Kitchen telmisartan-hydrochlorothiazide (MICARDIS HCT) 80-25 MG tablet Take 1 tablet by mouth daily. 90 tablet 1  . Na Sulfate-K Sulfate-Mg Sulf (SUPREP BOWEL PREP KIT) 17.5-3.13-1.6 GM/177ML SOLN Take 1 kit by mouth as directed. For colonoscopy prep 354 mL 0   No current facility-administered medications for this visit.    Allergies Allergies  Allergen Reactions  . Ciprofloxacin Hcl     Numbness, joint pain  . Cefdinir Other (See Comments)    Fever, chills, stomach cramps and diarrhea  . Lisinopril Cough    Histories Past Medical History:  Diagnosis Date  . Allergy   . Arthritis    left hip   . GERD (gastroesophageal reflux disease)   . Heart murmur    adolescent rheumatic fever - developed murmur   . Hyperlipidemia   . Hypertension   . RAD (reactive airway disease)    with RTIs  . Tubular adenoma    Past Surgical History:  Procedure Laterality Date  . COLONOSCOPY    . colonoscopy with polypectomy  2013   Dr Ferdinand Lango, [facility]  . POLYPECTOMY    . WISDOM TOOTH EXTRACTION     Social History   Socioeconomic History  . Marital status: Married    Spouse name: [name]  . Number of children: 2  . Years of education: Not on file  . Highest education level: Not on file  Occupational History  . Occupation: self employed   Tobacco Use  . Smoking status: Former Research scientist (life sciences)  . Smokeless tobacco: Never Used  . Tobacco comment: intermittent, short  term smoker as a teen. Some second hand smoke in 20s & 30s  Substance and Sexual Activity  . Alcohol use: Yes    Comment:   very rarely  . Drug use: No  . Sexual activity: Not on file  Other Topics Concern  . Not on file  Social History Narrative   Exercise: none regularly   Social Determinants of Health   Financial Resource Strain:   . Difficulty of Paying Living  Expenses:   Food Insecurity:   . Worried About Charity fundraiser in the Last Year:   . Arboriculturist in the Last Year:   Transportation Needs:   . Film/video editor (Medical):   Marland Kitchen Lack of Transportation (Non-Medical):   Physical Activity:   . Days of Exercise per Week:   . Minutes of Exercise per Session:   Stress:   . Feeling of Stress :   Social Connections:   . Frequency of Communication with Friends and Family:   . Frequency of Social Gatherings with Friends and Family:   . Attends Religious Services:   . Active Member of Clubs or Organizations:   . Attends Archivist Meetings:   Marland Kitchen Marital Status:   Intimate Partner Violence:   . Fear of Current or Ex-Partner:   . Emotionally Abused:   Marland Kitchen Physically Abused:   . Sexually Abused:    Family History  Problem Relation Age of Onset  . Arthritis Mother   . Hyperlipidemia Mother   . Lupus Mother   . Heart attack Father 68  . Hypertension Sister   . Heart attack Paternal Grandmother 23  . Diabetes Paternal Aunt   . Stroke Maternal Grandfather 43  . Cancer Paternal Grandfather        ? stomach; in 96s  . Colon cancer Paternal Grandfather   . Colon polyps Neg Hx   . Esophageal cancer Neg Hx   . Rectal cancer Neg Hx   . Stomach cancer Neg Hx   . Inflammatory bowel disease Neg Hx   . Liver disease Neg Hx   . Pancreatic cancer Neg Hx    I have reviewed his medical, social, and family history in detail and updated the electronic medical record as necessary.    PHYSICAL EXAMINATION  BP 120/70   Pulse 71   Temp 98.5 F (36.9 C)   Ht 6' 3"  (1.905 m)   Wt 209 lb (94.8 kg)   BMI 26.12 kg/m  Wt Readings from Last 3 Encounters:  05/29/19 209 lb (94.8 kg)  04/30/19 211 lb (95.7 kg)  04/16/19 211 lb (95.7 kg)  GEN: NAD, appears stated age, doesn't appear chronically ill PSYCH: Cooperative, without pressured speech EYE: Conjunctivae pink, sclerae anicteric ENT: MMM CV: Nontachycardic RESP: No wheezing  present GI: NABS, soft, NT/ND, without rebound or guarding MSK/EXT: No significant lower extremity edema SKIN: No jaundice NEURO:  Alert & Oriented x 3, no focal deficits   REVIEW OF DATA  I reviewed the following data at the time of this encounter:  GI Procedures and Studies  February 2021 colonoscopy - One 40 mm polyp in the ascending colon. Resection not attempted. - Three 2 to 6 mm polyps in the ascending colon, removed with a cold snare. Resected and retrieved. - Two 4 to 6 mm polyps in the transverse colon, removed with a cold snare. Resected and retrieved. - Diverticulosis in the proximal sigmoid colon and in the descending colon. - Internal hemorrhoids.  Laboratory  Studies  Reviewed those in epic  Imaging Studies  No relevant studies to review   ASSESSMENT  Mr. Kuhl is a 69 y.o. male with a pmh significant for GERD, hypertension, hyperlipidemia, reactive airway disease, osteoarthritis, diverticulosis, colon polyps.  The patient is seen today for evaluation and management of:  1. Hx of colonic polyp   2. Encounter for other preprocedural examination    The patient is clinically and hemodynamically stable.  Based upon the description and endoscopic pictures I do feel that it is reasonable to pursue an Advanced Polypectomy attempt of the polyp/lesion.  We discussed some of the techniques of advanced polypectomy which include Endoscopic Mucosal Resection, OVESCO Full-Thickness Resection, Endorotor Morcellation, and Tissue Ablation via Fulguration.  The risks and benefits of endoscopic evaluation were discussed with the patient; these include but are not limited to the risk of perforation, infection, bleeding, missed lesions, lack of diagnosis, severe illness requiring hospitalization, as well as anesthesia and sedation related illnesses.  During attempts at advanced resection, the risks of bleeding and perforation/leak are increased as opposed to diagnostic and screening  procedures, and that was discussed with the patient as well.   In addition, I explained that with the possible need for piecemeal resection, subsequent short-interval endoscopic evaluation for follow up and potential retreatment of the lesion/area may be necessary.  I did offer, a referral to surgery in order for patient to have opportunity to discuss surgical management/intervention prior to finalizing decision for attempt at endoscopic removal, however, the patient deferred on this.  If, after attempt at removal of the polyp/lesion, it is found that the patient has a complication or that an invasive lesion or malignant lesion is found, or that the polyp/lesion continues to recur, the patient is aware and understands that surgery may still be indicated/required.  All patient questions were answered, to the best of my ability, and the patient agrees to the aforementioned plan of action with follow-up as indicated.   PLAN  Proceed with scheduling colonoscopy with EMR Follow-up to be dictated after colonoscopy Preprocedural labs to be obtained as below   Orders Placed This Encounter  Procedures  . Procedural/ Surgical Case Request: COLONOSCOPY WITH PROPOFOL, ENDOSCOPIC MUCOSAL RESECTION  . CBC  . Basic Metabolic Panel (BMET)  . INR/PT  . Ambulatory referral to Gastroenterology    New Prescriptions   NA SULFATE-K SULFATE-MG SULF (SUPREP BOWEL PREP KIT) 17.5-3.13-1.6 GM/177ML SOLN    Take 1 kit by mouth as directed. For colonoscopy prep   Modified Medications   No medications on file    Planned Follow Up No follow-ups on file.   Total Time in Face-to-Face and in Coordination of Care for patient including independent/personal interpretation/review of prior testing, medical history, examination, medication adjustment, communicating results with the patient directly, and documentation with the EHR is 30 minutes.   Justice Britain, MD [facility]oscopy Office #  [phone]

## 2019-05-29 NOTE — Patient Instructions (Signed)
We have sent the following medications to your pharmacy for you to pick up at your convenience: [facility] provider has requested that you go to the basement level for lab work before leaving today. Press "B" on the elevator. The lab is located at the first door on the left as you exit the elevator.  You have been scheduled for a colonoscopy. Please follow written instructions given to you at your visit today.  Please pick up your prep supplies at the pharmacy within the next 1-3 days. If you use inhalers (even only as needed), please bring them with you on the day of your procedure.  Due to recent changes in healthcare laws, you may see the results of your imaging and laboratory studies on MyChart before your provider has had a chance to review them.  We understand that in some cases there may be results that are confusing or concerning to you. Not all laboratory results come back in the same time frame and the provider may be waiting for multiple results in order to interpret others.  Please give [place] 48 hours in order for your provider to thoroughly review all the results before contacting the office for clarification of your results.   Due to recent COVID-19 restrictions implemented by our local and state authorities and in an effort to keep both patients and staff as safe as possible, our hospital system now requires COVID-19 testing prior to any scheduled hospital procedure. Please go to our [facility] location drive thru testing site ([address] [place], [place] [address]) on 07/02/19 at  11:30am. There will be multiple testing areas, the first checkpoint being for pre-procedure/surgery testing. Get into the right (yellow) lane that leads to the PAT testing team. You will not be billed at the time of testing but may receive a bill later depending on your insurance. The approximate cost of the test is $100. You must agree to quarantine from the time of your testing until the procedure  date on 07/06/19 . This should include staying at home with ONLY the people you live with. Avoid take-out, grocery store shopping or leaving the house for any non-emergent reason. Failure to have your COVID-19 test done on the date and time you have been scheduled will result in cancellation of procedure. Please call our office at [phone] if you have any questions.   Thank you for choosing me and [facility].  Dr. Rush Landmark

## 2019-05-30 ENCOUNTER — Encounter: Payer: Self-pay | Admitting: Gastroenterology

## 2019-05-31 DIAGNOSIS — Z01818 Encounter for other preprocedural examination: Secondary | ICD-10-CM | POA: Insufficient documentation

## 2019-06-01 ENCOUNTER — Telehealth: Payer: Self-pay | Admitting: Gastroenterology

## 2019-06-01 NOTE — Telephone Encounter (Signed)
The pt has been advised of the appt change to 4/28 at 730 am [facility].  COVID test changed to 4/24.  He has been re instructed

## 2019-06-01 NOTE — Telephone Encounter (Signed)
Left message on machine to call back  

## 2019-06-01 NOTE — Telephone Encounter (Signed)
Pt requested to reschedule hospital procedure scheduled 07/06/19.

## 2019-06-09 ENCOUNTER — Other Ambulatory Visit: Payer: Self-pay | Admitting: Internal Medicine

## 2019-06-15 ENCOUNTER — Telehealth: Payer: Self-pay | Admitting: Gastroenterology

## 2019-06-15 NOTE — Telephone Encounter (Signed)
Prep changed to sutab- sample left at front desk.

## 2019-06-25 DIAGNOSIS — H524 Presbyopia: Secondary | ICD-10-CM | POA: Diagnosis not present

## 2019-06-25 DIAGNOSIS — H52223 Regular astigmatism, bilateral: Secondary | ICD-10-CM | POA: Diagnosis not present

## 2019-06-25 DIAGNOSIS — H5203 Hypermetropia, bilateral: Secondary | ICD-10-CM | POA: Diagnosis not present

## 2019-06-26 ENCOUNTER — Telehealth: Payer: Self-pay | Admitting: Gastroenterology

## 2019-06-27 ENCOUNTER — Inpatient Hospital Stay (HOSPITAL_COMMUNITY): Admission: RE | Admit: 2019-06-27 | Payer: Medicare HMO | Source: Ambulatory Visit

## 2019-06-29 ENCOUNTER — Other Ambulatory Visit (HOSPITAL_COMMUNITY)
Admission: RE | Admit: 2019-06-29 | Discharge: 2019-06-29 | Disposition: A | Discharge status: Home / Self Care | Payer: Medicare HMO | Source: Ambulatory Visit | Attending: Gastroenterology | Admitting: Gastroenterology

## 2019-06-29 DIAGNOSIS — Z01812 Encounter for preprocedural laboratory examination: Secondary | ICD-10-CM | POA: Diagnosis not present

## 2019-06-29 DIAGNOSIS — Z20822 Contact with and (suspected) exposure to covid-19: Secondary | ICD-10-CM | POA: Insufficient documentation

## 2019-06-29 LAB — SARS CORONAVIRUS 2 (TAT 6-24 HRS): SARS Coronavirus 2: NEGATIVE

## 2019-06-30 NOTE — Anesthesia Preprocedure Evaluation (Addendum)
Anesthesia Evaluation  Patient identified by MRN, date of birth, ID band Patient awake    Reviewed: Allergy & Precautions, H&P , NPO status , Patient's Chart, lab work & pertinent test results  Airway Mallampati: I  TM Distance: >3 FB Neck ROM: Full    Dental no notable dental hx. (+) Teeth Intact, Dental Advisory Given   Pulmonary neg pulmonary ROS, asthma , former smoker,    Pulmonary exam normal breath sounds clear to auscultation       Cardiovascular Exercise Tolerance: Good hypertension, Pt. on medications negative cardio ROS Normal cardiovascular exam Rhythm:Regular Rate:Normal     Neuro/Psych negative neurological ROS  negative psych ROS   GI/Hepatic negative GI ROS, Neg liver ROS, GERD  ,  Endo/Other  negative endocrine ROS  Renal/GU negative Renal ROS  negative genitourinary   Musculoskeletal negative musculoskeletal ROS (+) Arthritis , Osteoarthritis,    Abdominal   Peds negative pediatric ROS (+)  Hematology negative hematology ROS (+)   Anesthesia Other Findings   Reproductive/Obstetrics negative OB ROS                            Anesthesia Physical Anesthesia Plan  ASA: II  Anesthesia Plan: MAC   Post-op Pain Management:    Induction: Intravenous  PONV Risk Score and Plan:   Airway Management Planned: Mask and Natural Airway  Additional Equipment:   Intra-op Plan:   Post-operative Plan:   Informed Consent: I have reviewed the patients History and Physical, chart, labs and discussed the procedure including the risks, benefits and alternatives for the proposed anesthesia with the patient or authorized representative who has indicated his/her understanding and acceptance.       Plan Discussed with:   Anesthesia Plan Comments:         Anesthesia Quick Evaluation

## 2019-07-01 ENCOUNTER — Encounter (HOSPITAL_COMMUNITY): Payer: Self-pay | Admitting: Gastroenterology

## 2019-07-01 ENCOUNTER — Ambulatory Visit (HOSPITAL_COMMUNITY): Payer: Medicare HMO | Admitting: Anesthesiology

## 2019-07-01 ENCOUNTER — Encounter (HOSPITAL_COMMUNITY)
Admission: RE | Disposition: A | Discharge status: Home / Self Care | Payer: Self-pay | Source: Home / Self Care | Attending: Gastroenterology

## 2019-07-01 ENCOUNTER — Other Ambulatory Visit: Payer: Self-pay

## 2019-07-01 ENCOUNTER — Ambulatory Visit (HOSPITAL_COMMUNITY)
Admission: RE | Admit: 2019-07-01 | Discharge: 2019-07-01 | Disposition: A | Discharge status: Home / Self Care | Payer: Medicare HMO | Attending: Gastroenterology | Admitting: Gastroenterology

## 2019-07-01 DIAGNOSIS — I10 Essential (primary) hypertension: Secondary | ICD-10-CM | POA: Insufficient documentation

## 2019-07-01 DIAGNOSIS — K5730 Diverticulosis of large intestine without perforation or abscess without bleeding: Secondary | ICD-10-CM | POA: Insufficient documentation

## 2019-07-01 DIAGNOSIS — K644 Residual hemorrhoidal skin tags: Secondary | ICD-10-CM | POA: Diagnosis not present

## 2019-07-01 DIAGNOSIS — K641 Second degree hemorrhoids: Secondary | ICD-10-CM | POA: Diagnosis not present

## 2019-07-01 DIAGNOSIS — D122 Benign neoplasm of ascending colon: Secondary | ICD-10-CM | POA: Insufficient documentation

## 2019-07-01 DIAGNOSIS — J45909 Unspecified asthma, uncomplicated: Secondary | ICD-10-CM | POA: Insufficient documentation

## 2019-07-01 DIAGNOSIS — M1990 Unspecified osteoarthritis, unspecified site: Secondary | ICD-10-CM | POA: Diagnosis not present

## 2019-07-01 DIAGNOSIS — E785 Hyperlipidemia, unspecified: Secondary | ICD-10-CM | POA: Diagnosis not present

## 2019-07-01 DIAGNOSIS — Z87891 Personal history of nicotine dependence: Secondary | ICD-10-CM | POA: Insufficient documentation

## 2019-07-01 DIAGNOSIS — K635 Polyp of colon: Secondary | ICD-10-CM

## 2019-07-01 DIAGNOSIS — Z86010 Personal history of colonic polyps: Secondary | ICD-10-CM | POA: Diagnosis not present

## 2019-07-01 DIAGNOSIS — K219 Gastro-esophageal reflux disease without esophagitis: Secondary | ICD-10-CM | POA: Insufficient documentation

## 2019-07-01 HISTORY — PX: COLONOSCOPY WITH PROPOFOL: SHX5780

## 2019-07-01 HISTORY — PX: HEMOSTASIS CLIP PLACEMENT: SHX6857

## 2019-07-01 HISTORY — PX: SUBMUCOSAL LIFTING INJECTION: SHX6855

## 2019-07-01 HISTORY — PX: POLYPECTOMY: SHX5525

## 2019-07-01 HISTORY — PX: ENDOSCOPIC MUCOSAL RESECTION: SHX6839

## 2019-07-01 HISTORY — PX: SUBMUCOSAL TATTOO INJECTION: SHX6856

## 2019-07-01 SURGERY — COLONOSCOPY WITH PROPOFOL
Anesthesia: Monitor Anesthesia Care

## 2019-07-01 MED ORDER — LIDOCAINE HCL (CARDIAC) PF 100 MG/5ML IV SOSY
PREFILLED_SYRINGE | INTRAVENOUS | Status: DC | PRN
  Administered 2019-07-01: 80 mg via INTRAVENOUS

## 2019-07-01 MED ORDER — PROPOFOL 10 MG/ML IV BOLUS
INTRAVENOUS | Status: DC | PRN
  Administered 2019-07-01: 30 mg via INTRAVENOUS

## 2019-07-01 MED ORDER — PROPOFOL 500 MG/50ML IV EMUL
INTRAVENOUS | Status: DC | PRN
  Administered 2019-07-01: 125 ug/kg/min via INTRAVENOUS

## 2019-07-01 MED ORDER — PROPOFOL 10 MG/ML IV BOLUS
INTRAVENOUS | Status: AC
  Filled 2019-07-01: qty 20

## 2019-07-01 MED ORDER — SPOT INK MARKER SYRINGE KIT
PACK | SUBMUCOSAL | Status: AC
  Filled 2019-07-01: qty 5

## 2019-07-01 MED ORDER — LACTATED RINGERS IV SOLN
INTRAVENOUS | Status: DC
  Administered 2019-07-01: 1000 mL via INTRAVENOUS

## 2019-07-01 MED ORDER — SPOT INK MARKER SYRINGE KIT
PACK | SUBMUCOSAL | Status: DC | PRN
  Administered 2019-07-01: 1.5 mL via SUBMUCOSAL

## 2019-07-01 MED ORDER — SODIUM CHLORIDE 0.9 % IV SOLN: INTRAVENOUS | Status: DC

## 2019-07-01 SURGICAL SUPPLY — 21 items

## 2019-07-01 NOTE — Op Note (Signed)
[facility] Patient Name: Charles Tate Procedure Date: 07/01/2019 MRN: [id number] Attending MD: Justice Britain , MD Date of Birth: March 18, 1950 CSN: [id number] Age: 69 Admit Type: Outpatient Procedure:                Colonoscopy Indications:              Excision of colonic polyp Providers:                Justice Britain, MD, Jeanella Cara, RN,                            Elspeth Cho Tech., Technician, Stephanie                            [place] CRNA Referring MD:             Lajuan Lines. Hilarie Fredrickson, MD, [name], MD Medicines:                Monitored Anesthesia Care Complications:            No immediate complications. Estimated Blood Loss:     Estimated blood loss was minimal. Procedure:                Pre-Anesthesia Assessment:                           - Prior to the procedure, a History and Physical                            was performed, and patient medications and                            allergies were reviewed. The patient's tolerance of                            previous anesthesia was also reviewed. The risks                            and benefits of the procedure and the sedation                            options and risks were discussed with the patient.                            All questions were answered, and informed consent                            was obtained. Prior Anticoagulants: The patient has                            taken no previous anticoagulant or antiplatelet                            agents. ASA Grade Assessment: II - A patient with  mild systemic disease. After reviewing the risks                            and benefits, the patient was deemed in                            satisfactory condition to undergo the procedure.                           After obtaining informed consent, the colonoscope                            was passed under direct vision. Throughout the               procedure, the patient's blood pressure, pulse, and                            oxygen saturations were monitored continuously. The                            CF-HQ190L ([id number] Olympus colonoscope was                            introduced through the anus and advanced to the the                            cecum, identified by the appendiceal orifice, IC                            valve and transillumination. The colonoscopy was                            technically difficult and complex. Successful                            completion of the procedure was aided by performing                            the maneuvers documented (below) in this report.                            The patient tolerated the procedure. The quality of                            the bowel preparation was adequate. The terminal                            ileum, ileocecal valve, appendiceal orifice, and                            rectum were photographed. Scope In: 7:41:22 AM Scope Out: 9:03:01 AM Scope Withdrawal Time: 1 hour 13 minutes 31 seconds  Total Procedure Duration: 1 hour 21 minutes 39 seconds  Findings:      The digital rectal exam  findings include hemorrhoids. Pertinent       negatives include no palpable rectal lesions.      The terminal ileum and ileocecal valve appeared normal.      A 2 mm polyp was found in the cecum. The polyp was sessile. The polyp       was removed with a cold snare. Resection and retrieval were complete.      An at least 30 mm polyp was found in the proximal ascending colon. The       polyp was sessile. Preparations were made for mucosal resection. Orise       gel was injected to raise the lesion. Piecemeal mucosal resection using       a snare was performed. Resection and retrieval were complete. To close       the defect after mucosal resection, seven hemostatic clips were       successfully placed (MR conditional). There was no bleeding at the end       of the  procedure. Area distal to the resection was tattooed with an       injection of Spot (carbon black).      Many small and large-mouthed diverticula were found in the entire colon.      Normal mucosa was found in the entire colon otherwise.      Non-bleeding non-thrombosed external and internal hemorrhoids were found       during retroflexion, during perianal exam and during digital exam. The       hemorrhoids were Grade II (internal hemorrhoids that prolapse but reduce       spontaneously). Impression:               - Hemorrhoids found on digital rectal exam.                           - The examined portion of the ileum was normal.                           - One 2 mm polyp in the cecum, removed with a cold                            snare. Resected and retrieved.                           - One at least 30 mm polyp in the proximal                            ascending colon, removed with piecemeal mucosal                            resection. Resected and retrieved. Clips (MR                            conditional) were placed. Tattooed distal to the                            resection site for marking purposes.                           -  Diverticulosis in the entire examined colon.                           - Normal mucosa in the entire examined colon.                           - Non-bleeding non-thrombosed external and internal                            hemorrhoids. Moderate Sedation:      Not Applicable - Patient had care per Anesthesia. Recommendation:           - The patient will be observed post-procedure,                            until all discharge criteria are met.                           - Discharge patient to home.                           - Patient has a contact number available for                            emergencies. The signs and symptoms of potential                            delayed complications were discussed with the                            patient.  Return to normal activities tomorrow.                            Written discharge instructions were provided to the                            patient.                           - Have a Happy Birthday!!!                           - High fiber diet.                           - Continue present medications.                           - No aspirin, ibuprofen, naproxen, or other                            non-steroidal anti-inflammatory drugs for 2 weeks.                           - Await pathology results.                           -  Repeat colonoscopy in 6 months for surveillance.                           - The findings and recommendations were discussed                            with the patient.                           - The findings and recommendations were discussed                            with the patient's family. Procedure Code(s):        --- Professional ---                           [id number], Colonoscopy, flexible; with endoscopic                            mucosal resection                           45385, 10, Colonoscopy, flexible; with removal of                            tumor(s), polyp(s), or other lesion(s) by snare                            technique Diagnosis Code(s):        --- Professional ---                           K64.1, Second degree hemorrhoids                           K63.5, Polyp of colon                           K57.30, Diverticulosis of large intestine without                            perforation or abscess without bleeding CPT copyright 2019 American Medical Association. All rights reserved. The codes documented in this report are preliminary and upon coder review may  be revised to meet current compliance requirements. Justice Britain, MD 07/01/2019 9:23:09 AM Number of Addenda: 0

## 2019-07-01 NOTE — H&P (Signed)
GASTROENTEROLOGY PROCEDURE H&P NOTE   Primary Care Physician: [name], MD  HPI: Charles Tate is a 69 y.o. male who presents for Colonoscopy with EMR attempt of AC polyp.  Past Medical History:  Diagnosis Date  . Allergy   . Arthritis    left hip   . GERD (gastroesophageal reflux disease)   . Heart murmur    adolescent rheumatic fever - developed murmur   . Hyperlipidemia   . Hypertension   . RAD (reactive airway disease)    with RTIs  . Tubular adenoma    Past Surgical History:  Procedure Laterality Date  . COLONOSCOPY    . colonoscopy with polypectomy  2013   Dr Ferdinand Lango, [facility]  . POLYPECTOMY    . WISDOM TOOTH EXTRACTION     Current Facility-Administered Medications  Medication Dose Route Frequency Provider Last Rate Last Admin  . 0.9 %  sodium chloride infusion   Intravenous Continuous Mansouraty, Telford Nab., MD      . lactated ringers infusion   Intravenous Continuous Mansouraty, Telford Nab., MD 10 mL/hr at 07/01/19 0709 1,000 mL at 07/01/19 0709   Allergies  Allergen Reactions  . Ciprofloxacin Hcl     Numbness, joint pain  . Cefdinir Other (See Comments)    Fever, chills, stomach cramps and diarrhea  . Lisinopril Cough   Family History  Problem Relation Age of Onset  . Arthritis Mother   . Hyperlipidemia Mother   . Lupus Mother   . Heart attack Father 81  . Hypertension Sister   . Heart attack Paternal Grandmother 77  . Diabetes Paternal Aunt   . Stroke Maternal Grandfather 76  . Cancer Paternal Grandfather        ? stomach; in 28s  . Colon cancer Paternal Grandfather   . Colon polyps Neg Hx   . Esophageal cancer Neg Hx   . Rectal cancer Neg Hx   . Stomach cancer Neg Hx   . Inflammatory bowel disease Neg Hx   . Liver disease Neg Hx   . Pancreatic cancer Neg Hx    Social History   Socioeconomic History  . Marital status: Married    Spouse name: [name]  . Number of children: 2  . Years of education: Not on file  . Highest  education level: Not on file  Occupational History  . Occupation: self employed   Tobacco Use  . Smoking status: Former Research scientist (life sciences)  . Smokeless tobacco: Never Used  . Tobacco comment: intermittent, short term smoker as a teen. Some second hand smoke in 20s & 30s  Substance and Sexual Activity  . Alcohol use: Yes    Comment:   very rarely  . Drug use: No  . Sexual activity: Not on file  Other Topics Concern  . Not on file  Social History Narrative   Exercise: none regularly   Social Determinants of Health   Financial Resource Strain:   . Difficulty of Paying Living Expenses:   Food Insecurity:   . Worried About Charity fundraiser in the Last Year:   . Arboriculturist in the Last Year:   Transportation Needs:   . Film/video editor (Medical):   Marland Kitchen Lack of Transportation (Non-Medical):   Physical Activity:   . Days of Exercise per Week:   . Minutes of Exercise per Session:   Stress:   . Feeling of Stress :   Social Connections:   . Frequency of Communication with Friends  and Family:   . Frequency of Social Gatherings with Friends and Family:   . Attends Religious Services:   . Active Member of Clubs or Organizations:   . Attends Archivist Meetings:   Marland Kitchen Marital Status:   Intimate Partner Violence:   . Fear of Current or Ex-Partner:   . Emotionally Abused:   Marland Kitchen Physically Abused:   . Sexually Abused:     Physical Exam: Vital signs in last 24 hours: Temp:  [98.6 F (37 C)] 98.6 F (37 C) (04/28 0654) Pulse Rate:  [72] 72 (04/28 0654) Resp:  [20] 20 (04/28 0654) BP: (140)/(71) 140/71 (04/28 0654) SpO2:  [95 %] 95 % (04/28 0654) Weight:  [93 kg] 93 kg (04/28 0649)   GEN: NAD EYE: Sclerae anicteric ENT: MMM CV: Non-tachycardic GI: Soft, NT/ND NEURO:  Alert & Oriented x 3  Lab Results: No results for input(s): WBC, HGB, HCT, PLT in the last 72 hours. BMET No results for input(s): NA, K, CL, CO2, GLUCOSE, BUN, CREATININE, CALCIUM in the last 72  hours. LFT No results for input(s): PROT, ALBUMIN, AST, ALT, ALKPHOS, BILITOT, BILIDIR, IBILI in the last 72 hours. PT/INR No results for input(s): LABPROT, INR in the last 72 hours.   Impression / Plan: This is a 69 y.o.male who presents for Colonoscopy with EMR attempt of AC polyp.  The risks and benefits of endoscopic evaluation were discussed with the patient; these include but are not limited to the risk of perforation, infection, bleeding, missed lesions, lack of diagnosis, severe illness requiring hospitalization, as well as anesthesia and sedation related illnesses.  The patient is agreeable to proceed.    Justice Britain, MD [facility] Office # [phone]

## 2019-07-01 NOTE — Transfer of Care (Signed)
Immediate Anesthesia Transfer of Care Note  Patient: Charles Tate  Procedure(s) Performed: COLONOSCOPY WITH PROPOFOL (N/A ) ENDOSCOPIC MUCOSAL RESECTION (N/A ) POLYPECTOMY SUBMUCOSAL LIFTING INJECTION HEMOSTASIS CLIP PLACEMENT SUBMUCOSAL TATTOO INJECTION  Patient Location: PACU and Endoscopy Unit  Anesthesia Type:MAC  Level of Consciousness: awake and drowsy  Airway & Oxygen Therapy: Patient Spontanous Breathing and Patient connected to face mask oxygen  Post-op Assessment: Report given to RN and Post -op Vital signs reviewed and stable  Post vital signs: Reviewed and stable  Last Vitals:  Vitals Value Taken Time  BP    Temp    Pulse 59 07/01/19 0914  Resp 17 07/01/19 0914  SpO2 100 % 07/01/19 0914  Vitals shown include unvalidated device data.  Last Pain:  Vitals:   07/01/19 0654  TempSrc: Oral  PainSc:          Complications: No apparent anesthesia complications

## 2019-07-01 NOTE — Discharge Instructions (Signed)

## 2019-07-01 NOTE — Anesthesia Postprocedure Evaluation (Signed)
Anesthesia Post Note  Patient: Charles Tate  Procedure(s) Performed: COLONOSCOPY WITH PROPOFOL (N/A ) ENDOSCOPIC MUCOSAL RESECTION (N/A ) POLYPECTOMY SUBMUCOSAL LIFTING INJECTION HEMOSTASIS CLIP PLACEMENT SUBMUCOSAL TATTOO INJECTION     Patient location during evaluation: PACU Anesthesia Type: MAC Level of consciousness: awake and alert Pain management: pain level controlled Vital Signs Assessment: post-procedure vital signs reviewed and stable Respiratory status: spontaneous breathing, nonlabored ventilation, respiratory function stable and patient connected to nasal cannula oxygen Cardiovascular status: stable and blood pressure returned to baseline Postop Assessment: no apparent nausea or vomiting Anesthetic complications: no    Last Vitals:  Vitals:   07/01/19 0925 07/01/19 0935  BP: 122/63 139/69  Pulse:  (!) 48  Resp: 13 12  Temp:    SpO2: 97% 97%    Last Pain:  Vitals:   07/01/19 0935  TempSrc:   PainSc: 0-No pain                 Tityana Pagan

## 2019-07-02 ENCOUNTER — Encounter: Payer: Self-pay | Admitting: *Deleted

## 2019-07-02 ENCOUNTER — Other Ambulatory Visit (HOSPITAL_COMMUNITY): Payer: Medicare HMO

## 2019-07-02 LAB — SURGICAL PATHOLOGY

## 2019-07-03 ENCOUNTER — Encounter: Payer: Self-pay | Admitting: Gastroenterology

## 2019-07-05 ENCOUNTER — Other Ambulatory Visit: Payer: Self-pay | Admitting: Internal Medicine

## 2019-07-20 ENCOUNTER — Other Ambulatory Visit: Payer: Self-pay | Admitting: Internal Medicine

## 2019-07-20 DIAGNOSIS — R509 Fever, unspecified: Secondary | ICD-10-CM | POA: Diagnosis not present

## 2019-07-20 DIAGNOSIS — R05 Cough: Secondary | ICD-10-CM | POA: Diagnosis not present

## 2019-07-20 DIAGNOSIS — Z20828 Contact with and (suspected) exposure to other viral communicable diseases: Secondary | ICD-10-CM | POA: Diagnosis not present

## 2019-07-27 ENCOUNTER — Telehealth (INDEPENDENT_AMBULATORY_CARE_PROVIDER_SITE_OTHER): Payer: Medicare HMO | Admitting: Family

## 2019-07-27 DIAGNOSIS — J209 Acute bronchitis, unspecified: Secondary | ICD-10-CM | POA: Diagnosis not present

## 2019-07-27 MED ORDER — DOXYCYCLINE HYCLATE 100 MG PO TABS
100.0000 mg | ORAL_TABLET | Freq: Two times a day (BID) | ORAL | 0 refills | Status: DC
Start: 2019-07-27 — End: 2019-08-13

## 2019-07-27 MED ORDER — PREDNISONE 20 MG PO TABS
20.0000 mg | ORAL_TABLET | Freq: Every day | ORAL | 0 refills | Status: DC
Start: 2019-07-27 — End: 2019-08-13

## 2019-07-27 NOTE — Progress Notes (Signed)
Charles Tate is a 69 y.o. male with the following history as recorded in EpicCare:  Patient Active Problem List   Diagnosis Date Noted  . Encounter for other preprocedural examination 05/31/2019  . Tear of MCL (medial collateral ligament) of knee, left, initial encounter 08/05/2017  . Gluteal tendinitis of left buttock 08/05/2017  . Left inguinal hernia 07/04/2017  . Allergic rhinitis 12/28/2015  . H/O: rheumatic fever 12/28/2015  . Undiagnosed cardiac murmurs 12/28/2015  . Prediabetes 12/27/2015  . Rising PSA level 02/21/2014  . Hx of colonic polyp 02/17/2014  . 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 Hyperlipidemia 01/07/2009  . Asthma 06/04/2007  . Essential hypertension 09/26/2006    Current Outpatient Medications  Medication Sig Dispense Refill  . fluticasone (FLONASE) 50 MCG/ACT nasal spray Place 2 sprays into both nostrils daily. 16 g 6  . Misc Natural Products (SUPER GREENS PO) Take 1 Scoop by mouth daily.     . Multiple Vitamin (MULTIVITAMIN) capsule Take 1 capsule by mouth daily.      Marland Kitchen telmisartan-hydrochlorothiazide (MICARDIS HCT) 80-25 MG tablet TAKE 1 TABLET BY MOUTH DAILY. NEED OFFICE VISIT FOR MORE REFILLS. 30 tablet 0  . doxycycline (VIBRA-TABS) 100 MG tablet Take 1 tablet (100 mg total) by mouth 2 (two) times daily. 20 tablet 0  . predniSONE (DELTASONE) 20 MG tablet Take 1 tablet (20 mg total) by mouth daily with breakfast. 5 tablet 0   No current facility-administered medications for this visit.    Allergies: Ciprofloxacin hcl, Cefdinir, and Lisinopril  Past Medical History:  Diagnosis Date  . Allergy   . Arthritis    left hip   . GERD (gastroesophageal reflux disease)   . Heart murmur    adolescent rheumatic fever - developed murmur   . Hyperlipidemia   . Hypertension   . RAD (reactive airway disease)    with RTIs  . Tubular adenoma     Past Surgical History:  Procedure Laterality Date  . COLONOSCOPY    . colonoscopy  with polypectomy  2013   Dr Ferdinand Lango, [facility]  . COLONOSCOPY WITH PROPOFOL N/A 07/01/2019   Procedure: COLONOSCOPY WITH PROPOFOL;  Surgeon: Rush Landmark Telford Nab., MD;  Location: [facility];  Service: Gastroenterology;  Laterality: N/A;  . ENDOSCOPIC MUCOSAL RESECTION N/A 07/01/2019   Procedure: ENDOSCOPIC MUCOSAL RESECTION;  Surgeon: Rush Landmark Telford Nab., MD;  Location: [facility];  Service: Gastroenterology;  Laterality: N/A;  . HEMOSTASIS CLIP PLACEMENT  07/01/2019   Procedure: HEMOSTASIS CLIP PLACEMENT;  Surgeon: Irving Copas., MD;  Location: [facility];  Service: Gastroenterology;;  . POLYPECTOMY    . POLYPECTOMY  07/01/2019   Procedure: POLYPECTOMY;  Surgeon: Mansouraty, Telford Nab., MD;  Location: [facility];  Service: Gastroenterology;;  . Lia Foyer LIFTING INJECTION  07/01/2019   Procedure: SUBMUCOSAL LIFTING INJECTION;  Surgeon: Irving Copas., MD;  Location: [facility];  Service: Gastroenterology;;  . Lia Foyer TATTOO INJECTION  07/01/2019   Procedure: SUBMUCOSAL TATTOO INJECTION;  Surgeon: Irving Copas., MD;  Location: [facility];  Service: Gastroenterology;;  . Arnetha Courser TOOTH EXTRACTION      Family History  Problem Relation Age of Onset  . Arthritis Mother   . Hyperlipidemia Mother   . Lupus Mother   . Heart attack Father 59  . Hypertension Sister   . Heart attack Paternal Grandmother 28  . Diabetes Paternal Aunt   . Stroke Maternal Grandfather 61  . Cancer Paternal Grandfather        ? stomach; in 33s  .  Colon cancer Paternal Grandfather   . Colon polyps Neg Hx   . Esophageal cancer Neg Hx   . Rectal cancer Neg Hx   . Stomach cancer Neg Hx   . Inflammatory bowel disease Neg Hx   . Liver disease Neg Hx   . Pancreatic cancer Neg Hx     Social History   Tobacco Use  . Smoking status: Former Research scientist (life sciences)  . Smokeless tobacco: Never Used  . Tobacco comment: intermittent, short term smoker as a teen. Some second hand smoke in 20s &  30s  Substance Use Topics  . Alcohol use: Yes    Comment:   very rarely    Subjective:    I connected with Charles Tate on 07/27/19 at  1:40 PM EDT by a video enabled telemedicine application and verified that I am speaking with the correct person using two identifiers.   I discussed the limitations of evaluation and management by telemedicine and the availability of in person appointments. The patient expressed understanding and agreed to proceed.  Provider in office/ patient is at home; provider and patient are only 2 people on video call.   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 Currently taking Zyrtec and Mucinex;    Objective:  There were no vitals filed for this visit.  General: Well developed, well nourished, in no acute distress  Skin : Warm and dry.  Head: Normocephalic and atraumatic  Lungs: Respirations unlabored;  Neurologic: Alert and oriented; speech intact; face symmetrical;   Assessment:  1. Acute bronchitis, unspecified organism     Plan:  Rx for Doxycycline 100 mg bid x 10 days; Rx for Prednisone 20 mg qd x 5 days; cost of albuterol was prohibitive; increase fluids, rest and follow-up worse, no better.   No follow-ups on file.  No orders of the defined types were placed in this encounter.   Requested Prescriptions   Signed Prescriptions Disp Refills  . doxycycline (VIBRA-TABS) 100 MG tablet 20 tablet 0    Sig: Take 1 tablet (100 mg total) by mouth 2 (two) times daily.  . predniSONE (DELTASONE) 20 MG tablet 5 tablet 0    Sig: Take 1 tablet (20 mg total) by mouth daily with breakfast.

## 2019-08-12 ENCOUNTER — Other Ambulatory Visit: Payer: Self-pay | Admitting: Internal Medicine

## 2019-08-13 NOTE — Patient Instructions (Addendum)
  Blood work was ordered.     Medications reviewed and updated.  Changes include :   astelin nasal spray  Your prescription(s) have been submitted to your pharmacy. Please take as directed and contact our office if you believe you are having problem(s) with the medication(s).    Please followup in 6 months

## 2019-08-13 NOTE — Progress Notes (Signed)
Subjective:    Patient ID: Charles Tate, male    DOB: 1950/05/09, 69 y.o.   MRN: [id number]  HPI The patient is here for follow up of their chronic medical problems, including htn, asthma, gerd, prediabetes, hyperlipidemia, allergic  He is taking all of his medications as prescribed.    He is active, but not exercising regularly.     He completed the antibiotic.  He still has PND that is clear-white.  He still has a tickle.  He is taking flonase.    He feels a lump in his throat when he swallows. He denies GERD.   That started with the recent cold symptoms.   Medications and allergies reviewed with patient and updated if appropriate.  Patient Active Problem List   Diagnosis Date Noted  . Tear of MCL (medial collateral ligament) of knee, left, initial encounter 08/05/2017  . Gluteal tendinitis of left buttock 08/05/2017  . Left inguinal hernia 07/04/2017  . Allergic rhinitis 12/28/2015  . H/O: rheumatic fever 12/28/2015  . Undiagnosed cardiac murmurs 12/28/2015  . Prediabetes 12/27/2015  . Rising PSA level 02/21/2014  . Hx of colonic polyp 02/17/2014  . 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 Hyperlipidemia 01/07/2009  . Asthma 06/04/2007  . Essential hypertension 09/26/2006    Current Outpatient Medications on File Prior to Visit  Medication Sig Dispense Refill  . fluticasone (FLONASE) 50 MCG/ACT nasal spray Place 2 sprays into both nostrils daily. 16 g 6  . Misc Natural Products (SUPER GREENS PO) Take 1 Scoop by mouth daily.     . Multiple Vitamin (MULTIVITAMIN) capsule Take 1 capsule by mouth daily.      Marland Kitchen telmisartan-hydrochlorothiazide (MICARDIS HCT) 80-25 MG tablet TAKE 1 TABLET BY MOUTH DAILY. NEED OFFICE VISIT FOR MORE REFILLS. 30 tablet 0   No current facility-administered medications on file prior to visit.    Past Medical History:  Diagnosis Date  . Allergy   . Arthritis    left hip   . GERD (gastroesophageal reflux disease)    . Heart murmur    adolescent rheumatic fever - developed murmur   . Hyperlipidemia   . Hypertension   . RAD (reactive airway disease)    with RTIs  . Tubular adenoma     Past Surgical History:  Procedure Laterality Date  . COLONOSCOPY    . colonoscopy with polypectomy  2013   Dr Ferdinand Lango, [facility]  . COLONOSCOPY WITH PROPOFOL N/A 07/01/2019   Procedure: COLONOSCOPY WITH PROPOFOL;  Surgeon: Rush Landmark Telford Nab., MD;  Location: [facility];  Service: Gastroenterology;  Laterality: N/A;  . ENDOSCOPIC MUCOSAL RESECTION N/A 07/01/2019   Procedure: ENDOSCOPIC MUCOSAL RESECTION;  Surgeon: Rush Landmark Telford Nab., MD;  Location: [facility];  Service: Gastroenterology;  Laterality: N/A;  . HEMOSTASIS CLIP PLACEMENT  07/01/2019   Procedure: HEMOSTASIS CLIP PLACEMENT;  Surgeon: Irving Copas., MD;  Location: [facility];  Service: Gastroenterology;;  . POLYPECTOMY    . POLYPECTOMY  07/01/2019   Procedure: POLYPECTOMY;  Surgeon: Mansouraty, Telford Nab., MD;  Location: [facility];  Service: Gastroenterology;;  . Lia Foyer LIFTING INJECTION  07/01/2019   Procedure: SUBMUCOSAL LIFTING INJECTION;  Surgeon: Irving Copas., MD;  Location: [facility];  Service: Gastroenterology;;  . Lia Foyer TATTOO INJECTION  07/01/2019   Procedure: SUBMUCOSAL TATTOO INJECTION;  Surgeon: Irving Copas., MD;  Location: [facility];  Service: Gastroenterology;;  . Arnetha Courser TOOTH EXTRACTION      Social History   Socioeconomic History  .  Marital status: Married    Spouse name: [name]  . Number of children: 2  . Years of education: Not on file  . Highest education level: Not on file  Occupational History  . Occupation: self employed   Tobacco Use  . Smoking status: Former Research scientist (life sciences)  . Smokeless tobacco: Never Used  . Tobacco comment: intermittent, short term smoker as a teen. Some second hand smoke in 39s & 30s  Vaping Use  . Vaping Use: Never used  Substance and Sexual  Activity  . Alcohol use: Yes    Comment:   very rarely  . Drug use: No  . Sexual activity: Not on file  Other Topics Concern  . Not on file  Social History Narrative   Exercise: none regularly   Social Determinants of Health   Financial Resource Strain:   . Difficulty of Paying Living Expenses:   Food Insecurity:   . Worried About Charity fundraiser in the Last Year:   . Arboriculturist in the Last Year:   Transportation Needs:   . Film/video editor (Medical):   Marland Kitchen Lack of Transportation (Non-Medical):   Physical Activity:   . Days of Exercise per Week:   . Minutes of Exercise per Session:   Stress:   . Feeling of Stress :   Social Connections:   . Frequency of Communication with Friends and Family:   . Frequency of Social Gatherings with Friends and Family:   . Attends Religious Services:   . Active Member of Clubs or Organizations:   . Attends Archivist Meetings:   Marland Kitchen Marital Status:     Family History  Problem Relation Age of Onset  . Arthritis Mother   . Hyperlipidemia Mother   . Lupus Mother   . Heart attack Father 35  . Hypertension Sister   . Heart attack Paternal Grandmother 54  . Diabetes Paternal Aunt   . Stroke Maternal Grandfather 37  . Cancer Paternal Grandfather        ? stomach; in 28s  . Colon cancer Paternal Grandfather   . Colon polyps Neg Hx   . Esophageal cancer Neg Hx   . Rectal cancer Neg Hx   . Stomach cancer Neg Hx   . Inflammatory bowel disease Neg Hx   . Liver disease Neg Hx   . Pancreatic cancer Neg Hx     Review of Systems  Constitutional: Negative for chills and fever.  HENT: Positive for postnasal drip. Negative for congestion, sinus pain and sore throat.   Respiratory: Positive for cough (occ, reactive to PND, tickle). Negative for shortness of breath and wheezing.   Cardiovascular: Negative for chest pain, palpitations and leg swelling.  Gastrointestinal:       No gerd  Genitourinary: Negative for difficulty  urinating.  Neurological: Negative for light-headedness and headaches.       Objective:   Vitals:   08/14/19 0852  BP: 134/84  Pulse: 68  Temp: 97.9 F (36.6 C)  SpO2: 98%   BP Readings from Last 3 Encounters:  08/14/19 134/84  07/01/19 139/69  05/29/19 120/70   Wt Readings from Last 3 Encounters:  08/14/19 209 lb (94.8 kg)  07/01/19 205 lb (93 kg)  05/29/19 209 lb (94.8 kg)   Body mass index is 26.12 kg/m.   Physical Exam    Constitutional: Appears well-developed and well-nourished. No distress.  HENT:  Head: Normocephalic and atraumatic.  Neck: Neck supple. No tracheal deviation present. No  thyromegaly present.  No cervical lymphadenopathy Cardiovascular: Normal rate, regular rhythm and normal heart sounds.   No murmur heard. No carotid bruit .  No edema Pulmonary/Chest: Effort normal and breath sounds normal. No respiratory distress. No has no wheezes. No rales.  Skin: Skin is warm and dry. Not diaphoretic.  Psychiatric: Normal mood and affect. Behavior is normal.     Assessment & Plan:    See Problem List for Assessment and Plan of chronic medical problems.    This visit occurred during the SARS-CoV-2 public health emergency.  Safety protocols were in place, including screening questions prior to the visit, additional usage of staff PPE, and extensive cleaning of exam room while observing appropriate contact time as indicated for disinfecting solutions.

## 2019-08-14 ENCOUNTER — Other Ambulatory Visit: Payer: Self-pay

## 2019-08-14 ENCOUNTER — Ambulatory Visit (INDEPENDENT_AMBULATORY_CARE_PROVIDER_SITE_OTHER): Payer: Medicare HMO | Admitting: Internal Medicine

## 2019-08-14 ENCOUNTER — Encounter: Payer: Self-pay | Admitting: Internal Medicine

## 2019-08-14 VITALS — BP 134/84 | HR 68 | Temp 97.9°F | Ht 75.0 in | Wt 209.0 lb

## 2019-08-14 DIAGNOSIS — R7303 Prediabetes: Secondary | ICD-10-CM | POA: Diagnosis not present

## 2019-08-14 DIAGNOSIS — E782 Mixed hyperlipidemia: Secondary | ICD-10-CM

## 2019-08-14 DIAGNOSIS — R972 Elevated prostate specific antigen [PSA]: Secondary | ICD-10-CM

## 2019-08-14 DIAGNOSIS — J4520 Mild intermittent asthma, uncomplicated: Secondary | ICD-10-CM

## 2019-08-14 DIAGNOSIS — I10 Essential (primary) hypertension: Secondary | ICD-10-CM | POA: Diagnosis not present

## 2019-08-14 DIAGNOSIS — J309 Allergic rhinitis, unspecified: Secondary | ICD-10-CM | POA: Diagnosis not present

## 2019-08-14 LAB — LIPID PANEL
Cholesterol: 201 mg/dL — ABNORMAL HIGH (ref 0–200)
HDL: 38.6 mg/dL — ABNORMAL LOW (ref >39.00)
LDL Cholesterol: 143 mg/dL — ABNORMAL HIGH (ref 0–99)
NonHDL: 162.41
Total CHOL/HDL Ratio: 5
Triglycerides: 99 mg/dL (ref 0.0–149.0)
VLDL: 19.8 mg/dL (ref 0.0–40.0)

## 2019-08-14 LAB — COMPREHENSIVE METABOLIC PANEL
ALT: 16 U/L (ref 0–53)
AST: 17 U/L (ref 0–37)
Albumin: 4.4 g/dL (ref 3.5–5.2)
Alkaline Phosphatase: 75 U/L (ref 39–117)
BUN: 17 mg/dL (ref 6–23)
CO2: 30 mEq/L (ref 19–32)
Calcium: 9.2 mg/dL (ref 8.4–10.5)
Chloride: 100 mEq/L (ref 96–112)
Creatinine, Ser: 0.84 mg/dL (ref 0.40–1.50)
GFR: 90.31 mL/min (ref >60.00)
Glucose, Bld: 103 mg/dL — ABNORMAL HIGH (ref 70–99)
Potassium: 3.9 mEq/L (ref 3.5–5.1)
Sodium: 136 mEq/L (ref 135–145)
Total Bilirubin: 0.6 mg/dL (ref 0.2–1.2)
Total Protein: 7 g/dL (ref 6.0–8.3)

## 2019-08-14 LAB — PSA, MEDICARE: PSA: 3.05 ng/ml (ref 0.10–4.00)

## 2019-08-14 MED ORDER — AZELASTINE HCL 0.1 % NA SOLN: 1.0000 | Freq: Two times a day (BID) | NASAL | 12 refills | Status: DC

## 2019-08-14 NOTE — Assessment & Plan Note (Signed)
Has not seen urology in a while PSA 6 months ago had improved, but need to monitor Recheck PSA today

## 2019-08-14 NOTE — Assessment & Plan Note (Signed)
Chronic Check a1c Low sugar / carb diet Stressed regular exercise  

## 2019-08-14 NOTE — Assessment & Plan Note (Addendum)
Chronic Controlled with Flonase He has some increased postnasal drip related to recent infection Trial of Astelin along with Flonase Can also try Claritin-this typically makes him very drowsy, but advised that he take it at night

## 2019-08-14 NOTE — Assessment & Plan Note (Signed)
Chronic, mild, intermittent Related to allergies Controlled with Flonase Continue

## 2019-08-14 NOTE — Assessment & Plan Note (Addendum)
Chronic-lipids higher 6 months ago than typical Check lipid panel  Diet controlled Regular exercise and healthy diet encouraged He has a very strong family history of heart disease and given his age and high blood pressure he should be on a statin medication We will see what his blood work shows today, but anticipate recommending starting a statin Given his risk factors discussed that we should consider coronary artery calcium CT or have him see cardiology.  Since he is asymptomatic I think considering the CT scan would be appropriate

## 2019-08-14 NOTE — Assessment & Plan Note (Signed)
Chronic BP well controlled Current regimen effective and well tolerated Continue current medications at current doses cmp  

## 2019-08-17 LAB — HEMOGLOBIN A1C: Hgb A1c MFr Bld: 5.9 % (ref 4.6–6.5)

## 2019-08-18 ENCOUNTER — Encounter: Payer: Self-pay | Admitting: Internal Medicine

## 2019-08-18 DIAGNOSIS — I10 Essential (primary) hypertension: Secondary | ICD-10-CM

## 2019-08-18 DIAGNOSIS — Z8249 Family history of ischemic heart disease and other diseases of the circulatory system: Secondary | ICD-10-CM

## 2019-08-18 DIAGNOSIS — E782 Mixed hyperlipidemia: Secondary | ICD-10-CM

## 2019-08-18 MED ORDER — ATORVASTATIN CALCIUM 20 MG PO TABS
20.0000 mg | ORAL_TABLET | Freq: Every day | ORAL | 3 refills | Status: DC
Start: 2019-08-18 — End: 2020-01-20

## 2019-08-28 ENCOUNTER — Ambulatory Visit (INDEPENDENT_AMBULATORY_CARE_PROVIDER_SITE_OTHER)
Admission: RE | Admit: 2019-08-28 | Discharge: 2019-08-28 | Disposition: A | Discharge status: Home / Self Care | Payer: Self-pay | Source: Ambulatory Visit | Attending: Internal Medicine | Admitting: Internal Medicine

## 2019-08-28 ENCOUNTER — Other Ambulatory Visit: Payer: Self-pay

## 2019-08-28 DIAGNOSIS — R918 Other nonspecific abnormal finding of lung field: Secondary | ICD-10-CM | POA: Diagnosis not present

## 2019-08-28 DIAGNOSIS — I10 Essential (primary) hypertension: Secondary | ICD-10-CM

## 2019-08-28 DIAGNOSIS — Z8249 Family history of ischemic heart disease and other diseases of the circulatory system: Secondary | ICD-10-CM

## 2019-08-28 DIAGNOSIS — E782 Mixed hyperlipidemia: Secondary | ICD-10-CM

## 2019-08-31 ENCOUNTER — Encounter: Payer: Self-pay | Admitting: Internal Medicine

## 2019-08-31 DIAGNOSIS — R931 Abnormal findings on diagnostic imaging of heart and coronary circulation: Secondary | ICD-10-CM | POA: Insufficient documentation

## 2019-09-01 ENCOUNTER — Encounter: Payer: Self-pay | Admitting: Family

## 2019-09-01 ENCOUNTER — Other Ambulatory Visit: Payer: Self-pay | Admitting: Family

## 2019-09-01 ENCOUNTER — Telehealth (INDEPENDENT_AMBULATORY_CARE_PROVIDER_SITE_OTHER): Payer: Medicare HMO | Admitting: Family

## 2019-09-01 ENCOUNTER — Other Ambulatory Visit (INDEPENDENT_AMBULATORY_CARE_PROVIDER_SITE_OTHER): Payer: Medicare HMO

## 2019-09-01 ENCOUNTER — Other Ambulatory Visit: Payer: Self-pay

## 2019-09-01 ENCOUNTER — Ambulatory Visit (INDEPENDENT_AMBULATORY_CARE_PROVIDER_SITE_OTHER)
Admission: RE | Admit: 2019-09-01 | Discharge: 2019-09-01 | Disposition: A | Discharge status: Home / Self Care | Payer: Medicare HMO | Source: Ambulatory Visit | Attending: Family | Admitting: Family

## 2019-09-01 DIAGNOSIS — R509 Fever, unspecified: Secondary | ICD-10-CM | POA: Diagnosis not present

## 2019-09-01 DIAGNOSIS — R300 Dysuria: Secondary | ICD-10-CM | POA: Diagnosis not present

## 2019-09-01 DIAGNOSIS — A689 Relapsing fever, unspecified: Secondary | ICD-10-CM

## 2019-09-01 DIAGNOSIS — R062 Wheezing: Secondary | ICD-10-CM | POA: Diagnosis not present

## 2019-09-01 LAB — URINALYSIS
Bilirubin Urine: NEGATIVE
Hgb urine dipstick: NEGATIVE
Ketones, ur: NEGATIVE
Leukocytes,Ua: NEGATIVE
Nitrite: NEGATIVE
Specific Gravity, Urine: 1.01 (ref 1.000–1.030)
Total Protein, Urine: NEGATIVE
Urine Glucose: NEGATIVE
Urobilinogen, UA: 0.2 (ref 0.0–1.0)
pH: 7 (ref 5.0–8.0)

## 2019-09-01 LAB — CBC WITH DIFFERENTIAL/PLATELET
Basophils Absolute: 0.1 10*3/uL (ref 0.0–0.1)
Basophils Relative: 1.9 % (ref 0.0–3.0)
Eosinophils Absolute: 0.1 10*3/uL (ref 0.0–0.7)
Eosinophils Relative: 2.1 % (ref 0.0–5.0)
HCT: 42.9 % (ref 39.0–52.0)
Hemoglobin: 14.7 g/dL (ref 13.0–17.0)
Lymphocytes Relative: 15.6 % (ref 12.0–46.0)
Lymphs Abs: 0.7 10*3/uL (ref 0.7–4.0)
MCHC: 34.2 g/dL (ref 30.0–36.0)
MCV: 87.2 fl (ref 78.0–100.0)
Monocytes Absolute: 0.7 10*3/uL (ref 0.1–1.0)
Monocytes Relative: 15.8 % — ABNORMAL HIGH (ref 3.0–12.0)
Neutro Abs: 2.7 10*3/uL (ref 1.4–7.7)
Neutrophils Relative %: 64.6 % (ref 43.0–77.0)
Platelets: 173 10*3/uL (ref 150.0–400.0)
RBC: 4.92 Mil/uL (ref 4.22–5.81)
RDW: 13.2 % (ref 11.5–15.5)
WBC: 4.2 10*3/uL (ref 4.0–10.5)

## 2019-09-01 MED ORDER — AMOXICILLIN-POT CLAVULANATE 875-125 MG PO TABS: 1.0000 | ORAL_TABLET | Freq: Two times a day (BID) | ORAL | 0 refills | Status: DC

## 2019-09-01 NOTE — Progress Notes (Signed)
Charles Tate is a 69 y.o. male with the following history as recorded in Epic[employer]:  Patient Active Problem List   Diagnosis Date Noted  . 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 Tear of MCL (medial collateral ligament) of knee, left, initial encounter 08/05/2017  . Gluteal tendinitis of left buttock 08/05/2017  . Left inguinal hernia 07/04/2017  . Allergic rhinitis 12/28/2015  . H/O: rheumatic fever 12/28/2015  . Undiagnosed cardiac murmurs 12/28/2015  . Prediabetes 12/27/2015  . Rising PSA level 02/21/2014  . Hx of colonic polyp 02/17/2014  . 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 Hyperlipidemia 01/07/2009  . Asthma 06/04/2007  . Essential hypertension 09/26/2006    Current Outpatient Medications  Medication Sig Dispense Refill  . atorvastatin (LIPITOR) 20 MG tablet Take 1 tablet (20 mg total) by mouth daily. 90 tablet 3  . azelastine (ASTELIN) 0.1 % nasal spray Place 1 spray into both nostrils 2 (two) times daily. Use in each nostril as directed 30 mL 12  . fluticasone (FLONASE) 50 MCG/ACT nasal spray Place 2 sprays into both nostrils daily. 16 g 6  . Misc Natural Products (SUPER GREENS PO) Take 1 Scoop by mouth daily.     . Multiple Vitamin (MULTIVITAMIN) capsule Take 1 capsule by mouth daily.      Marland Kitchen telmisartan-hydrochlorothiazide (MICARDIS HCT) 80-25 MG tablet TAKE 1 TABLET BY MOUTH DAILY. NEED OFFICE VISIT FOR MORE REFILLS. 30 tablet 0   No current facility-administered medications for this visit.    Allergies: Ciprofloxacin hcl, Cefdinir, and Lisinopril  Past Medical History:  Diagnosis Date  . Allergy   . Arthritis    left hip   . GERD (gastroesophageal reflux disease)   . Heart murmur    adolescent rheumatic fever - developed murmur   . Hyperlipidemia   . Hypertension   . RAD (reactive airway disease)    with RTIs  . Tubular adenoma     Past Surgical History:  Procedure Laterality Date  .  COLONOSCOPY    . colonoscopy with polypectomy  2013   Dr Ferdinand Lango, [facility]  . COLONOSCOPY WITH PROPOFOL N/A 07/01/2019   Procedure: COLONOSCOPY WITH PROPOFOL;  Surgeon: Rush Landmark Telford Nab., MD;  Location: [facility];  Service: Gastroenterology;  Laterality: N/A;  . ENDOSCOPIC MUCOSAL RESECTION N/A 07/01/2019   Procedure: ENDOSCOPIC MUCOSAL RESECTION;  Surgeon: Rush Landmark Telford Nab., MD;  Location: [facility];  Service: Gastroenterology;  Laterality: N/A;  . HEMOSTASIS CLIP PLACEMENT  07/01/2019   Procedure: HEMOSTASIS CLIP PLACEMENT;  Surgeon: Irving Copas., MD;  Location: [facility];  Service: Gastroenterology;;  . POLYPECTOMY    . POLYPECTOMY  07/01/2019   Procedure: POLYPECTOMY;  Surgeon: Mansouraty, Telford Nab., MD;  Location: [facility];  Service: Gastroenterology;;  . Lia Foyer LIFTING INJECTION  07/01/2019   Procedure: SUBMUCOSAL LIFTING INJECTION;  Surgeon: Irving Copas., MD;  Location: [facility];  Service: Gastroenterology;;  . Lia Foyer TATTOO INJECTION  07/01/2019   Procedure: SUBMUCOSAL TATTOO INJECTION;  Surgeon: Irving Copas., MD;  Location: [facility];  Service: Gastroenterology;;  . Arnetha Courser TOOTH EXTRACTION      Family History  Problem Relation Age of Onset  . Arthritis Mother   . Hyperlipidemia Mother   . Lupus Mother   . Heart attack Father 24  . Hypertension Sister   . Heart attack Paternal Grandmother 40  . Diabetes Paternal Aunt   . Stroke Maternal Grandfather 29  . Cancer Paternal Grandfather        ?  stomach; in 59s  . Colon cancer Paternal Grandfather   . Colon polyps Neg Hx   . Esophageal cancer Neg Hx   . Rectal cancer Neg Hx   . Stomach cancer Neg Hx   . Inflammatory bowel disease Neg Hx   . Liver disease Neg Hx   . Pancreatic cancer Neg Hx     Social History   Tobacco Use  . Smoking status: Former Research scientist (life sciences)  . Smokeless tobacco: Never Used  . Tobacco comment: intermittent, short term smoker as a teen.  Some second hand smoke in 20s & 30s  Substance Use Topics  . Alcohol use: Yes    Comment:   very rarely    Subjective:   I connected with Johnney Killian on 09/01/19 at  9:00 AM EDT by a video enabled telemedicine application and verified that I am speaking with the correct person using two identifiers.   I discussed the limitations of evaluation and management by telemedicine and the availability of in person appointments. The patient expressed understanding and agreed to proceed. Provider in office/ patient is at home; provider and patient are only 2 people on video call.   Concerned for a possible sinus infection; symptoms started last Thursday and has been running a low grade fever; + nasal congestion; increased fatigue; + aching in teeth- left; "tight dull headache." Notes that over the weekend he had an episode where he was physically shaking because he was so cold from the fever; Was treated for similar symptoms at end of May and did feel that his symptoms resolved completely;  COVID vaccine completed in early April;    Objective:  There were no vitals filed for this visit.  General: Well developed, well nourished, in no acute distress  Skin : Warm and dry.  Head: Normocephalic and atraumatic  Lungs: Respirations unlabored; clear to auscultation bilaterally without wheeze, rales, rhonchi  Neurologic: Alert and oriented; speech intact; face symmetrical; moves all extremities well; CNII-XII intact without focal deficit  Assessment:  1. Recurrent fever     Plan:  Will update CBC, CXR and U/A today; patient's symptoms are concerning for more than a simple sinus infection; will plan to choose antibiotic to cover respiratory and urinary source; follow-up to be determined.    No follow-ups on file.  Orders Placed This Encounter  Procedures  . Urine Culture    Standing Status:   Future    Standing Expiration Date:   08/31/2020  . DG Chest 2 View    Standing Status:   Future     Standing Expiration Date:   08/31/2020    Order Specific Question:   Reason for Exam (SYMPTOM  OR DIAGNOSIS REQUIRED)    Answer:   recurrent fever    Order Specific Question:   Preferred imaging location?    Answer:   [facility]    Order Specific Question:   Radiology Contrast Protocol - do NOT remove file path    Answer:   [email or web address]  . CBC with Differential/Platelet    Standing Status:   Future    Standing Expiration Date:   08/31/2020  . Urinalysis    Standing Status:   Future    Standing Expiration Date:   08/31/2020    Requested Prescriptions    No prescriptions requested or ordered in this encounter

## 2019-09-02 LAB — URINE CULTURE
MICRO NUMBER:: 10647087
Result:: NO GROWTH
SPECIMEN QUALITY:: ADEQUATE

## 2019-09-08 ENCOUNTER — Other Ambulatory Visit: Payer: Self-pay | Admitting: Internal Medicine

## 2019-09-21 DIAGNOSIS — L821 Other seborrheic keratosis: Secondary | ICD-10-CM | POA: Diagnosis not present

## 2019-09-21 DIAGNOSIS — D485 Neoplasm of uncertain behavior of skin: Secondary | ICD-10-CM | POA: Diagnosis not present

## 2019-09-21 DIAGNOSIS — D225 Melanocytic nevi of trunk: Secondary | ICD-10-CM | POA: Diagnosis not present

## 2019-09-21 DIAGNOSIS — L820 Inflamed seborrheic keratosis: Secondary | ICD-10-CM | POA: Diagnosis not present

## 2019-09-28 NOTE — Progress Notes (Signed)
Subjective:    Patient ID: Charles Tate, male    DOB: 06/01/1950, 69 y.o.   MRN: [id number]  HPI The patient is here for follow up of their chronic medical problems, including hyperlipidemia, htn  He is taking the statin daily.  He denies side effects.   CT cardiac scoring 08/28/19:  IMPRESSION: Coronary calcium score of 55.5 Agatston units. This was 34th percentile for age and sex matched control, suggesting low risk for future cardiac events.  He is not exercising regularly.     Medications and allergies reviewed with patient and updated if appropriate.  Patient Active Problem List   Diagnosis Date Noted  . 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 Tear of MCL (medial collateral ligament) of knee, left, initial encounter 08/05/2017  . Gluteal tendinitis of left buttock 08/05/2017  . Left inguinal hernia 07/04/2017  . Allergic rhinitis 12/28/2015  . H/O: rheumatic fever 12/28/2015  . Undiagnosed cardiac murmurs 12/28/2015  . Prediabetes 12/27/2015  . Rising PSA level 02/21/2014  . Hx of colonic polyp 02/17/2014  . 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 Hyperlipidemia 01/07/2009  . Asthma 06/04/2007  . Essential hypertension 09/26/2006    Current Outpatient Medications on File Prior to Visit  Medication Sig Dispense Refill  . atorvastatin (LIPITOR) 20 MG tablet Take 1 tablet (20 mg total) by mouth daily. 90 tablet 3  . azelastine (ASTELIN) 0.1 % nasal spray Place 1 spray into both nostrils 2 (two) times daily. Use in each nostril as directed 30 mL 12  . fluticasone (FLONASE) 50 MCG/ACT nasal spray Place 2 sprays into both nostrils daily. 16 g 6  . Misc Natural Products (SUPER GREENS PO) Take 1 Scoop by mouth daily.     . Multiple Vitamin (MULTIVITAMIN) capsule Take 1 capsule by mouth daily.      Marland Kitchen telmisartan-hydrochlorothiazide (MICARDIS HCT) 80-25 MG tablet TAKE 1 TABLET ONCE DAILY, NEED OFFICE VISIT BEFORE ANY  MORE REFILLS 30 tablet 2   No current facility-administered medications on file prior to visit.    Past Medical History:  Diagnosis Date  . Allergy   . Arthritis    left hip   . GERD (gastroesophageal reflux disease)   . Heart murmur    adolescent rheumatic fever - developed murmur   . Hyperlipidemia   . Hypertension   . RAD (reactive airway disease)    with RTIs  . Tubular adenoma     Past Surgical History:  Procedure Laterality Date  . COLONOSCOPY    . colonoscopy with polypectomy  2013   Dr Ferdinand Lango, [facility]  . COLONOSCOPY WITH PROPOFOL N/A 07/01/2019   Procedure: COLONOSCOPY WITH PROPOFOL;  Surgeon: Rush Landmark Telford Nab., MD;  Location: [facility];  Service: Gastroenterology;  Laterality: N/A;  . ENDOSCOPIC MUCOSAL RESECTION N/A 07/01/2019   Procedure: ENDOSCOPIC MUCOSAL RESECTION;  Surgeon: Rush Landmark Telford Nab., MD;  Location: [facility];  Service: Gastroenterology;  Laterality: N/A;  . HEMOSTASIS CLIP PLACEMENT  07/01/2019   Procedure: HEMOSTASIS CLIP PLACEMENT;  Surgeon: Irving Copas., MD;  Location: [facility];  Service: Gastroenterology;;  . POLYPECTOMY    . POLYPECTOMY  07/01/2019   Procedure: POLYPECTOMY;  Surgeon: Mansouraty, Telford Nab., MD;  Location: [facility];  Service: Gastroenterology;;  . Lia Foyer LIFTING INJECTION  07/01/2019   Procedure: SUBMUCOSAL LIFTING INJECTION;  Surgeon: Irving Copas., MD;  Location: [facility];  Service: Gastroenterology;;  . Lia Foyer TATTOO INJECTION  07/01/2019   Procedure:  SUBMUCOSAL TATTOO INJECTION;  Surgeon: Rush Landmark Telford Nab., MD;  Location: [facility];  Service: Gastroenterology;;  . Arnetha Courser TOOTH EXTRACTION      Social History   Socioeconomic History  . Marital status: Married    Spouse name: [name]  . Number of children: 2  . Years of education: Not on file  . Highest education level: Not on file  Occupational History  . Occupation: self employed   Tobacco Use  .  Smoking status: Former Research scientist (life sciences)  . Smokeless tobacco: Never Used  . Tobacco comment: intermittent, short term smoker as a teen. Some second hand smoke in 82s & 30s  Vaping Use  . Vaping Use: Never used  Substance and Sexual Activity  . Alcohol use: Yes    Comment:   very rarely  . Drug use: No  . Sexual activity: Not on file  Other Topics Concern  . Not on file  Social History Narrative   Exercise: none regularly   Social Determinants of Health   Financial Resource Strain:   . Difficulty of Paying Living Expenses:   Food Insecurity:   . Worried About Charity fundraiser in the Last Year:   . Arboriculturist in the Last Year:   Transportation Needs:   . Film/video editor (Medical):   Marland Kitchen Lack of Transportation (Non-Medical):   Physical Activity:   . Days of Exercise per Week:   . Minutes of Exercise per Session:   Stress:   . Feeling of Stress :   Social Connections:   . Frequency of Communication with Friends and Family:   . Frequency of Social Gatherings with Friends and Family:   . Attends Religious Services:   . Active Member of Clubs or Organizations:   . Attends Archivist Meetings:   Marland Kitchen Marital Status:     Family History  Problem Relation Age of Onset  . Arthritis Mother   . Hyperlipidemia Mother   . Lupus Mother   . Heart attack Father 77  . Hypertension Sister   . Heart attack Paternal Grandmother 76  . Diabetes Paternal Aunt   . Stroke Maternal Grandfather 32  . Cancer Paternal Grandfather        ? stomach; in 55s  . Colon cancer Paternal Grandfather   . Colon polyps Neg Hx   . Esophageal cancer Neg Hx   . Rectal cancer Neg Hx   . Stomach cancer Neg Hx   . Inflammatory bowel disease Neg Hx   . Liver disease Neg Hx   . Pancreatic cancer Neg Hx     Review of Systems  Constitutional: Negative for fever.  Respiratory: Negative for shortness of breath.   Cardiovascular: Negative for chest pain and palpitations.  Musculoskeletal: Negative  for arthralgias and myalgias.  Neurological: Negative for headaches.       Objective:   Vitals:   09/29/19 1534  BP: 122/72  Pulse: 90  Temp: 98.1 F (36.7 C)  SpO2: 95%   BP Readings from Last 3 Encounters:  09/29/19 122/72  08/14/19 134/84  07/01/19 139/69   Wt Readings from Last 3 Encounters:  09/29/19 (!) 209 lb (94.8 kg)  08/14/19 209 lb (94.8 kg)  07/01/19 205 lb (93 kg)   Body mass index is 26.12 kg/m.   Physical Exam    Constitutional: Appears well-developed and well-nourished. No distress.  HENT:  Head: Normocephalic and atraumatic.  Neck: Neck supple. No tracheal deviation present. No thyromegaly present.  No cervical  lymphadenopathy Cardiovascular: Normal rate, regular rhythm and normal heart sounds.  No murmur heard.  No edema Pulmonary/Chest: Effort normal and breath sounds normal. No respiratory distress. No has no wheezes. No rales.  Skin: Skin is warm and dry. Not diaphoretic.       Assessment & Plan:    See Problem List for Assessment and Plan of chronic medical problems.    This visit occurred during the SARS-CoV-2 public health emergency.  Safety protocols were in place, including screening questions prior to the visit, additional usage of staff PPE, and extensive cleaning of exam room while observing appropriate contact time as indicated for disinfecting solutions.

## 2019-09-28 NOTE — Patient Instructions (Addendum)
  Blood work was ordered.     Medications reviewed and updated.  Changes include :   none     Please followup in December as scheduled

## 2019-09-29 ENCOUNTER — Ambulatory Visit (INDEPENDENT_AMBULATORY_CARE_PROVIDER_SITE_OTHER): Payer: Medicare HMO | Admitting: Internal Medicine

## 2019-09-29 ENCOUNTER — Encounter: Payer: Self-pay | Admitting: Internal Medicine

## 2019-09-29 ENCOUNTER — Other Ambulatory Visit: Payer: Self-pay

## 2019-09-29 VITALS — BP 122/72 | HR 90 | Temp 98.1°F | Ht 75.0 in | Wt 209.0 lb

## 2019-09-29 DIAGNOSIS — I10 Essential (primary) hypertension: Secondary | ICD-10-CM | POA: Diagnosis not present

## 2019-09-29 DIAGNOSIS — E782 Mixed hyperlipidemia: Secondary | ICD-10-CM | POA: Diagnosis not present

## 2019-09-29 NOTE — Assessment & Plan Note (Signed)
Chronic BP well controlled Current regimen effective and well tolerated Continue current medications at current doses  

## 2019-09-29 NOTE — Assessment & Plan Note (Signed)
Chronic Check lipid panel, lfts Continue daily statin - he is tolerating this well Regular exercise and healthy diet encouraged

## 2019-09-30 DIAGNOSIS — D485 Neoplasm of uncertain behavior of skin: Secondary | ICD-10-CM | POA: Diagnosis not present

## 2019-09-30 LAB — LIPID PANEL
Cholesterol: 138 mg/dL (ref <200)
HDL: 33 mg/dL — ABNORMAL LOW (ref >=40)
LDL Cholesterol (Calc): 83 mg/dL (calc)
Non-HDL Cholesterol (Calc): 105 mg/dL (calc) (ref <130)
Total CHOL/HDL Ratio: 4.2 (calc) (ref <5.0)
Triglycerides: 122 mg/dL (ref <150)

## 2019-09-30 LAB — HEPATIC FUNCTION PANEL
AG Ratio: 1.6 (calc) (ref 1.0–2.5)
ALT: 21 U/L (ref 9–46)
AST: 20 U/L (ref 10–35)
Albumin: 4 g/dL (ref 3.6–5.1)
Alkaline phosphatase (APISO): 70 U/L (ref 35–144)
Bilirubin, Direct: 0.1 mg/dL (ref 0.0–0.2)
Globulin: 2.5 g/dL (calc) (ref 1.9–3.7)
Indirect Bilirubin: 0.4 mg/dL (calc) (ref 0.2–1.2)
Total Bilirubin: 0.5 mg/dL (ref 0.2–1.2)
Total Protein: 6.5 g/dL (ref 6.1–8.1)

## 2019-10-15 ENCOUNTER — Ambulatory Visit (INDEPENDENT_AMBULATORY_CARE_PROVIDER_SITE_OTHER): Payer: Medicare HMO | Admitting: Internal Medicine

## 2019-10-15 ENCOUNTER — Encounter: Payer: Self-pay | Admitting: Internal Medicine

## 2019-10-15 ENCOUNTER — Other Ambulatory Visit: Payer: Self-pay

## 2019-10-15 DIAGNOSIS — H10022 Other mucopurulent conjunctivitis, left eye: Secondary | ICD-10-CM

## 2019-10-15 MED ORDER — ERYTHROMYCIN 5 MG/GM OP OINT: 1.0000 "application " | TOPICAL_OINTMENT | Freq: Every day | OPHTHALMIC | 0 refills | Status: DC

## 2019-10-15 NOTE — Progress Notes (Signed)
   Subjective:   Patient ID: Charles Tate, male    DOB: 1950-05-14, 69 y.o.   MRN: [id number]  HPI The patient is a 69 YO man coming in for swelling lower eyelids and drainage. He has had two styes on the right lower eyelid in the last 2 weeks which is not usual for him. The one did go away in less than a week and now he has one midline lower eyelid which is stable but hasn't gone away. Since Monday he has had left lower eyelid swelling which is causing some vision blurring and crusting in the morning. Had some whitish discharge this morning. Denies fevers or chills. No loss of vision.   Review of Systems  Constitutional: Negative.   HENT: Negative.   Eyes: Positive for redness. Negative for photophobia and visual disturbance.       Eyelid swelling with drainage  Respiratory: Negative for cough, chest tightness and shortness of breath.   Cardiovascular: Negative for chest pain, palpitations and leg swelling.  Gastrointestinal: Negative for abdominal distention, abdominal pain, constipation, diarrhea, nausea and vomiting.  Musculoskeletal: Negative.   Skin: Negative.   Neurological: Negative.   Psychiatric/Behavioral: Negative.     Objective:  Physical Exam Constitutional:      Appearance: He is well-developed.  HENT:     Head: Normocephalic and atraumatic.  Eyes:     General: No scleral icterus.       Right eye: No discharge.        Left eye: Discharge present.    Pupils: Pupils are equal, round, and reactive to light.     Comments: Right eyelid with stye midline without drainage or cellulitis, left eyelid swollen with some white discharge underneath of eyelid, some redness to the sclera  Cardiovascular:     Rate and Rhythm: Normal rate and regular rhythm.  Pulmonary:     Effort: Pulmonary effort is normal. No respiratory distress.     Breath sounds: Normal breath sounds. No wheezing or rales.  Abdominal:     General: Bowel sounds are normal. There is no distension.      Palpations: Abdomen is soft.     Tenderness: There is no abdominal tenderness. There is no rebound.  Musculoskeletal:     Cervical back: Normal range of motion.  Skin:    General: Skin is warm and dry.  Neurological:     Mental Status: He is alert and oriented to person, place, and time.     Coordination: Coordination normal.     Vitals:   10/15/19 1549  BP: 122/78  Pulse: 73  Temp: 98.1 F (36.7 C)  TempSrc: Oral  SpO2: 97%  Weight: 227 lb (103 kg)  Height: 6\' 3"  (1.905 m)    This visit occurred during the SARS-CoV-2 public health emergency.  Safety protocols were in place, including screening questions prior to the visit, additional usage of staff PPE, and extensive cleaning of exam room while observing appropriate contact time as indicated for disinfecting solutions.   Assessment & Plan:

## 2019-10-15 NOTE — Patient Instructions (Signed)
We have sent in an ointment to use a thin ribbon on the lower eyelids in the evening and blink several times. Do not wipe off. Do this for 5 days.

## 2019-10-16 DIAGNOSIS — H10022 Other mucopurulent conjunctivitis, left eye: Secondary | ICD-10-CM | POA: Insufficient documentation

## 2019-10-16 NOTE — Assessment & Plan Note (Signed)
Rx erythromycin ointment to use for both eyes and avoid contacts until clear.

## 2019-10-22 ENCOUNTER — Telehealth: Payer: Self-pay

## 2019-10-22 NOTE — Telephone Encounter (Signed)
New  Seen Dr. Sharlet Salina 8.12.21  The patient asking for correction on his weight that was listed on his after-visit summary. The weight is listed as 227.   Previous weight with Dr. [name]  On 7.27.21 was 209. The patient voiced their no way he gains that much weight.   Asking for a callback

## 2019-10-22 NOTE — Telephone Encounter (Signed)
Weight correction has been made.

## 2019-11-05 ENCOUNTER — Telehealth: Payer: Self-pay | Admitting: Gastroenterology

## 2019-11-05 NOTE — Telephone Encounter (Signed)
Patient calling to schedule redo colon please advise

## 2019-11-05 NOTE — Telephone Encounter (Signed)
The pt has been advised that he has a recall in for late October early November.  We will contact him at that time. The pt has been advised of the information and verbalized understanding.

## 2019-11-18 ENCOUNTER — Other Ambulatory Visit: Payer: Medicare HMO

## 2019-11-18 ENCOUNTER — Other Ambulatory Visit: Payer: Self-pay

## 2019-11-18 DIAGNOSIS — Z20822 Contact with and (suspected) exposure to covid-19: Secondary | ICD-10-CM | POA: Diagnosis not present

## 2019-11-19 ENCOUNTER — Encounter: Payer: Self-pay | Admitting: Family Medicine

## 2019-11-19 ENCOUNTER — Telehealth (INDEPENDENT_AMBULATORY_CARE_PROVIDER_SITE_OTHER): Payer: Medicare HMO | Admitting: Family Medicine

## 2019-11-19 VITALS — BP 147/81 | Temp 99.0°F

## 2019-11-19 DIAGNOSIS — R062 Wheezing: Secondary | ICD-10-CM | POA: Diagnosis not present

## 2019-11-19 DIAGNOSIS — R0981 Nasal congestion: Secondary | ICD-10-CM | POA: Diagnosis not present

## 2019-11-19 DIAGNOSIS — I10 Essential (primary) hypertension: Secondary | ICD-10-CM | POA: Diagnosis not present

## 2019-11-19 DIAGNOSIS — R05 Cough: Secondary | ICD-10-CM

## 2019-11-19 DIAGNOSIS — R059 Cough, unspecified: Secondary | ICD-10-CM

## 2019-11-19 MED ORDER — ALBUTEROL SULFATE HFA 108 (90 BASE) MCG/ACT IN AERS: 2.0000 | INHALATION_SPRAY | Freq: Four times a day (QID) | RESPIRATORY_TRACT | 0 refills | Status: DC | PRN

## 2019-11-19 NOTE — Progress Notes (Signed)
Virtual Visit via Video Note  I connected with Charles Tate  on 11/19/19 at  5:00 PM EDT by a video enabled telemedicine application and verified that I am speaking with the correct person using two identifiers.  Location patient: home Location provider:work or home office Persons participating in the virtual visit: patient, provider  I discussed the limitations of evaluation and management by telemedicine and the availability of in person appointments. The patient expressed understanding and agreed to proceed.   HPI:  Acute visit for  -started 11/13/2019 -son, daughter, wife and grandkids have all had the same symptoms: nasal congestion, drainage, low grade fevers, cough, some body aches, yellow nasal congestion -got a COVID19 test yesterday, results pending -fully vaccinated for COVID19 with pfizer, 2nd dose in April -wife had the same and they put her on Augmentin -has been using muzinex and tylenol -BP has been a little higher than normal while sick -denies SOB, NVD, loss of taste or smell, inability to get out of bed or  -he has needed inhalers in the past when sick, has felt a little wheezy with this illness  ROS: See pertinent positives and negatives per HPI.  Past Medical History:  Diagnosis Date  . Allergy   . Arthritis    left hip   . GERD (gastroesophageal reflux disease)   . Heart murmur    adolescent rheumatic fever - developed murmur   . Hyperlipidemia   . Hypertension   . RAD (reactive airway disease)    with RTIs  . Tubular adenoma     Past Surgical History:  Procedure Laterality Date  . COLONOSCOPY    . colonoscopy with polypectomy  2013   Dr Ferdinand Lango, [facility]  . COLONOSCOPY WITH PROPOFOL N/A 07/01/2019   Procedure: COLONOSCOPY WITH PROPOFOL;  Surgeon: Rush Landmark Telford Nab., MD;  Location: [facility];  Service: Gastroenterology;  Laterality: N/A;  . ENDOSCOPIC MUCOSAL RESECTION N/A 07/01/2019   Procedure: ENDOSCOPIC MUCOSAL RESECTION;  Surgeon: Rush Landmark  Telford Nab., MD;  Location: [facility];  Service: Gastroenterology;  Laterality: N/A;  . HEMOSTASIS CLIP PLACEMENT  07/01/2019   Procedure: HEMOSTASIS CLIP PLACEMENT;  Surgeon: Irving Copas., MD;  Location: [facility];  Service: Gastroenterology;;  . POLYPECTOMY    . POLYPECTOMY  07/01/2019   Procedure: POLYPECTOMY;  Surgeon: Mansouraty, Telford Nab., MD;  Location: [facility];  Service: Gastroenterology;;  . Lia Foyer LIFTING INJECTION  07/01/2019   Procedure: SUBMUCOSAL LIFTING INJECTION;  Surgeon: Irving Copas., MD;  Location: [facility];  Service: Gastroenterology;;  . Lia Foyer TATTOO INJECTION  07/01/2019   Procedure: SUBMUCOSAL TATTOO INJECTION;  Surgeon: Irving Copas., MD;  Location: [facility];  Service: Gastroenterology;;  . Arnetha Courser TOOTH EXTRACTION      Family History  Problem Relation Age of Onset  . Arthritis Mother   . Hyperlipidemia Mother   . Lupus Mother   . Heart attack Father 51  . Hypertension Sister   . Heart attack Paternal Grandmother 66  . Diabetes Paternal Aunt   . Stroke Maternal Grandfather 1  . Cancer Paternal Grandfather        ? stomach; in 67s  . Colon cancer Paternal Grandfather   . Colon polyps Neg Hx   . Esophageal cancer Neg Hx   . Rectal cancer Neg Hx   . Stomach cancer Neg Hx   . Inflammatory bowel disease Neg Hx   . Liver disease Neg Hx   . Pancreatic cancer Neg Hx     SOCIAL HX: see hpi  Current Outpatient Medications:  .  atorvastatin (LIPITOR) 20 MG tablet, Take 1 tablet (20 mg total) by mouth daily., Disp: 90 tablet, Rfl: 3 .  azelastine (ASTELIN) 0.1 % nasal spray, Place 1 spray into both nostrils 2 (two) times daily. Use in each nostril as directed, Disp: 30 mL, Rfl: 12 .  fluticasone (FLONASE) 50 MCG/ACT nasal spray, Place 2 sprays into both nostrils daily., Disp: 16 g, Rfl: 6 .  Misc Natural Products (SUPER GREENS PO), Take 1 Scoop by mouth daily. , Disp: , Rfl:  .  Multiple Vitamin  (MULTIVITAMIN) capsule, Take 1 capsule by mouth daily.  , Disp: , Rfl:  .  Sarecycline HCl (SEYSARA) 150 MG TABS, Take by mouth. Given by derm after procedure last week, Disp: , Rfl:  .  telmisartan-hydrochlorothiazide (MICARDIS HCT) 80-25 MG tablet, TAKE 1 TABLET ONCE DAILY, NEED OFFICE VISIT BEFORE ANY MORE REFILLS, Disp: 30 tablet, Rfl: 2 .  albuterol (PROAIR HFA) 108 (90 Base) MCG/ACT inhaler, Inhale 2 puffs into the lungs every 6 (six) hours as needed for wheezing or shortness of breath., Disp: 1 each, Rfl: 0  EXAM:  VITALS per patient if applicable:  GENERAL: alert, oriented, appears well and in no acute distress  HEENT: atraumatic, conjunttiva clear, no obvious abnormalities on inspection of external nose and ears  NECK: normal movements of the head and neck  LUNGS: on inspection no signs of respiratory distress, breathing rate appears normal, no obvious gross SOB, gasping or wheezing  CV: no obvious cyanosis  MS: moves all visible extremities without noticeable abnormality  PSYCH/NEURO: pleasant and cooperative, no obvious depression or anxiety, speech and thought processing grossly intact  ASSESSMENT AND PLAN:  Discussed the following assessment and plan:  Cough  Sinus congestion  Wheezing  Essential hypertension  -we discussed possible serious and likely etiologies, options for evaluation and workup, limitations of telemedicine visit vs in person visit, treatment, treatment risks and precautions. Pt prefers to treat via telemedicine empirically rather then risking or undertaking an in person visit at this moment. Query VURI vs other. Possible associated mild bronchitis or asthma exacerbation. Discussed possibility of COVID19 breakthrough case as well. Opted to treat with nasal saline, prn analgesics, alb q6 hours prn for now. Feels otc options ok for cough.  Advised of treatment options if COVID19 positive and provided pt with outpt infusion clinic treatment # in case  results come back and can not get prompt follow up appt. He is  interested in this and could be cutting it close to the 10 day treatment window. Glad he had his covid shots. For the elevated BP, discussed safe meds to use for the cold symptoms and advised tylenol if needs analgesic. Advised to monitor BP and advised follow up visit for this and the wheezing next week to ensure improving. Work/School slipped offered: declined Scheduled follow up with PCP offered: Sent message to schedulers to assist and advised patient to contact PCP office to schedule if does not receive call back in next 24 hours. Advised to seek prompt follow up telemedicine visit or in person care if worsening, new symptoms arise, or if is not improving with treatment.   I discussed the assessment and treatment plan with the patient. The patient was provided an opportunity to ask questions and all were answered. The patient agreed with the plan and demonstrated an understanding of the instructions.     Lucretia Kern, DO

## 2019-11-19 NOTE — Patient Instructions (Addendum)
-  stay home while sick, and if you have [place] please stay home for a full 10 days since the onset of symptoms PLUS one day of no fever and feeling better  -I sent the medication(s) we discussed to your pharmacy: Meds ordered this encounter  Medications  . albuterol (PROAIR HFA) 108 (90 Base) MCG/ACT inhaler    Sig: Inhale 2 puffs into the lungs every 6 (six) hours as needed for wheezing or shortness of breath.    Dispense:  1 each    Refill:  0    -In case of a positive COVID test - outpatient COVID19 treatment center: [phone]  -can use tylenol if needed for fevers, aches and pains per instructions  -can use nasal saline a few times per day if nasal congestion, sometime a short course of Afrin nasal spray for 3 days can help as well  -stay hydrated, drink plenty of fluids and eat small healthy meals - avoid dairy  -can take 1000 IU Vit D3 and Vit C lozenges per instructions  -follow up with your doctor next week  I hope you are feeling better soon! Seek in-person care or a follow up telemedicine visit promptly if your symptoms worsen, new concerns arise or you are not improving as expected. Call 911 if severe symptoms.

## 2019-11-20 LAB — SARS-COV-2, NAA 2 DAY TAT

## 2019-11-20 LAB — NOVEL CORONAVIRUS, NAA: SARS-CoV-2, NAA: NOT DETECTED

## 2019-12-09 ENCOUNTER — Other Ambulatory Visit: Payer: Self-pay | Admitting: Internal Medicine

## 2019-12-14 ENCOUNTER — Telehealth: Payer: Self-pay | Admitting: Internal Medicine

## 2019-12-14 NOTE — Progress Notes (Signed)
  Chronic Care Management   Outreach Note  12/14/2019 Name: Charles Tate MRN: [id number] DOB: Jul 05, 1950  Referred by: [name], MD Reason for referral : No chief complaint on file.   An unsuccessful telephone outreach was attempted today. The patient was referred to the pharmacist for assistance with care management and care coordination.   Follow Up Plan:   Carley Perdue [employer] Scheduler

## 2019-12-16 NOTE — Telephone Encounter (Signed)
° ° °  Please return call to patient °

## 2019-12-17 ENCOUNTER — Telehealth: Payer: Self-pay | Admitting: Internal Medicine

## 2019-12-17 NOTE — Progress Notes (Signed)
  Chronic Care Management   Note  12/17/2019 Name: VERONICA GUERRANT MRN: [id number] DOB: Jul 19, 1950  Pierce Crane Goeden is a 69 y.o. year old male who is a primary care patient of Burns, Claudina Lick, MD. I reached out to Johnney Killian by phone today in response to a referral sent by Mr. Pierce Crane Maggart's PCP, [name], MD.   Mr. Schoonmaker was given information about Chronic Care Management services today including:  1. CCM service includes personalized support from designated clinical staff supervised by his physician, including individualized plan of care and coordination with other care providers 2. 24/7 contact phone numbers for assistance for urgent and routine care needs. 3. Service will only be billed when office clinical staff spend 20 minutes or more in a month to coordinate care. 4. Only one practitioner may furnish and bill the service in a calendar month. 5. The patient may stop CCM services at any time (effective at the end of the month) by phone call to the office staff.   Patient agreed to services and verbal consent obtained.   Follow up plan:   Carley Perdue [employer] Scheduler

## 2020-01-19 ENCOUNTER — Encounter (INDEPENDENT_AMBULATORY_CARE_PROVIDER_SITE_OTHER): Payer: Self-pay | Admitting: Ophthalmology

## 2020-01-19 ENCOUNTER — Other Ambulatory Visit: Payer: Self-pay

## 2020-01-19 ENCOUNTER — Ambulatory Visit (INDEPENDENT_AMBULATORY_CARE_PROVIDER_SITE_OTHER): Payer: Medicare HMO | Admitting: Ophthalmology

## 2020-01-19 DIAGNOSIS — H43813 Vitreous degeneration, bilateral: Secondary | ICD-10-CM | POA: Insufficient documentation

## 2020-01-19 DIAGNOSIS — H34231 Retinal artery branch occlusion, right eye: Secondary | ICD-10-CM | POA: Diagnosis not present

## 2020-01-19 NOTE — Assessment & Plan Note (Signed)
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  We will also have him use aspirin 325 mg daily until he has follow-up with his primary medical physician

## 2020-01-19 NOTE — Assessment & Plan Note (Signed)

## 2020-01-19 NOTE — Progress Notes (Addendum)
01/19/2020     CHIEF COMPLAINT Patient presents for Retina Evaluation   HISTORY OF PRESENT ILLNESS: TIA Charles Tate is a 69 y.o. male who presents to the clinic today for:   HPI    Retina Evaluation    In right eye.  This started 1 day ago.  Duration of 1 day.  Context:  distance vision, mid-range vision and near vision.  Treatments tried include no treatments.  Response to treatment was no improvement.          Comments    WIP - BRAO OD - Ref'd by Dr. Sharen Counter  Pt c/o blurry VA OD x approx 1 day. Pt sts, "I wasn't really aware of it." No ocular pain, flashes, or floaters OU. VA stable OS.       Last edited by Rockie Neighbours, [place] on 01/19/2020  4:10 PM. (History)      Referring physician: [name], MD [place],  [place] [address]  HISTORICAL INFORMATION:   Selected notes from the MEDICAL RECORD NUMBER    Lab Results  Component Value Date   HGBA1C 5.9 08/14/2019     CURRENT MEDICATIONS: No current outpatient medications on file. (Ophthalmic Drugs)   No current facility-administered medications for this visit. (Ophthalmic Drugs)   Current Outpatient Medications (Other)  Medication Sig  . albuterol (PROAIR HFA) 108 (90 Base) MCG/ACT inhaler Inhale 2 puffs into the lungs every 6 (six) hours as needed for wheezing or shortness of breath.  Marland Kitchen atorvastatin (LIPITOR) 20 MG tablet Take 1 tablet (20 mg total) by mouth daily.  Marland Kitchen azelastine (ASTELIN) 0.1 % nasal spray Place 1 spray into both nostrils 2 (two) times daily. Use in each nostril as directed  . fluticasone (FLONASE) 50 MCG/ACT nasal spray Place 2 sprays into both nostrils daily.  . Misc Natural Products (SUPER GREENS PO) Take 1 Scoop by mouth daily.   . Multiple Vitamin (MULTIVITAMIN) capsule Take 1 capsule by mouth daily.    . Sarecycline HCl (SEYSARA) 150 MG TABS Take by mouth. Given by derm after procedure last week  . telmisartan-hydrochlorothiazide (MICARDIS HCT) 80-25 MG tablet TAKE 1  TABLET ONCE DAILY, NEED OFFICE VISIT BEFORE ANY MORE REFILLS   No current facility-administered medications for this visit. (Other)      REVIEW OF SYSTEMS:    ALLERGIES Allergies  Allergen Reactions  . Ciprofloxacin Hcl     Numbness, joint pain  . Cefdinir Other (See Comments)    Fever, chills, stomach cramps and diarrhea  . Lisinopril Cough    PAST MEDICAL HISTORY Past Medical History:  Diagnosis Date  . Allergy   . Arthritis    left hip   . GERD (gastroesophageal reflux disease)   . Heart murmur    adolescent rheumatic fever - developed murmur   . Hyperlipidemia   . Hypertension   . RAD (reactive airway disease)    with RTIs  . Tubular adenoma    Past Surgical History:  Procedure Laterality Date  . COLONOSCOPY    . colonoscopy with polypectomy  2013   Dr Ferdinand Lango, [facility]  . COLONOSCOPY WITH PROPOFOL N/A 07/01/2019   Procedure: COLONOSCOPY WITH PROPOFOL;  Surgeon: Rush Landmark Telford Nab., MD;  Location: [facility];  Service: Gastroenterology;  Laterality: N/A;  . ENDOSCOPIC MUCOSAL RESECTION N/A 07/01/2019   Procedure: ENDOSCOPIC MUCOSAL RESECTION;  Surgeon: Rush Landmark Telford Nab., MD;  Location: [facility];  Service: Gastroenterology;  Laterality: N/A;  . HEMOSTASIS CLIP PLACEMENT  07/01/2019  Procedure: HEMOSTASIS CLIP PLACEMENT;  Surgeon: Irving Copas., MD;  Location: [facility];  Service: Gastroenterology;;  . POLYPECTOMY    . POLYPECTOMY  07/01/2019   Procedure: POLYPECTOMY;  Surgeon: Mansouraty, Telford Nab., MD;  Location: [facility];  Service: Gastroenterology;;  . Lia Foyer LIFTING INJECTION  07/01/2019   Procedure: SUBMUCOSAL LIFTING INJECTION;  Surgeon: Irving Copas., MD;  Location: [facility];  Service: Gastroenterology;;  . Lia Foyer TATTOO INJECTION  07/01/2019   Procedure: SUBMUCOSAL TATTOO INJECTION;  Surgeon: Irving Copas., MD;  Location: [facility];  Service: Gastroenterology;;  . Arnetha Courser TOOTH EXTRACTION       FAMILY HISTORY Family History  Problem Relation Age of Onset  . Arthritis Mother   . Hyperlipidemia Mother   . Lupus Mother   . Heart attack Father 38  . Hypertension Sister   . Heart attack Paternal Grandmother 56  . Diabetes Paternal Aunt   . Stroke Maternal Grandfather 73  . Cancer Paternal Grandfather        ? stomach; in 16s  . Colon cancer Paternal Grandfather   . Colon polyps Neg Hx   . Esophageal cancer Neg Hx   . Rectal cancer Neg Hx   . Stomach cancer Neg Hx   . Inflammatory bowel disease Neg Hx   . Liver disease Neg Hx   . Pancreatic cancer Neg Hx     SOCIAL HISTORY Social History   Tobacco Use  . Smoking status: Former Research scientist (life sciences)  . Smokeless tobacco: Never Used  . Tobacco comment: intermittent, short term smoker as a teen. Some second hand smoke in 5s & 30s  Vaping Use  . Vaping Use: Never used  Substance Use Topics  . Alcohol use: Yes    Comment:   very rarely  . Drug use: No         OPHTHALMIC EXAM:  Base Eye Exam    Visual Acuity (ETDRS)      Right Left   Dist cc 20/25 +2 20/25 +2   Correction: Glasses       Tonometry (Tonopen, 4:11 PM)      Right Left   Pressure 15 19       Pupils      Pupils Dark Light Shape React APD   Right PERRL 9 9 Round Dilated None   Left PERRL 9 9 Round Dilated None       Visual Fields (Counting fingers)      Left Right    Full Full       Extraocular Movement      Right Left    Full Full       Neuro/Psych    Oriented x3: Yes   Mood/Affect: Normal       Dilation    Right eye: 1.0% Mydriacyl, 2.5% Phenylephrine @ 4:13 PM        Slit Lamp and Fundus Exam    External Exam      Right Left   External Normal Normal       Slit Lamp Exam      Right Left   Lids/Lashes Normal Normal   Conjunctiva/Sclera White and quiet White and quiet   Cornea Clear Clear   Anterior Chamber Deep and quiet Deep and quiet   Iris Round and reactive Round and reactive   Anterior Vitreous Normal Normal         Fundus Exam      Right Left   Posterior Vitreous Posterior vitreous detachment Posterior vitreous detachment  Disc Normal Normal   C/D Ratio 0.2 0.2   Macula Intraretinal whitening nerve fiber layer not confluent but centered on cilioretinal vessel on the inferior aspect of the macula and nasal to the fovea. Normal   Vessels cilio retinal vessel occlusion @ 7 meridian on nerve, no Hollenhorst plaque Normal   Periphery Normal Normal          IMAGING AND PROCEDURES  Imaging and Procedures for 01/20/20  OCT, Retina - OU - Both Eyes       Right Eye Central Foveal Thickness: 295. Progression has no prior data. Findings include abnormal foveal contour.   Left Eye Quality was good. Scan locations included subfoveal. Central Foveal Thickness: 302. Progression has no prior data.   Notes Retinal infarction OD ,Of the inner retinal layers nasal to the fovea, although there is some preservation of retinal architecture in this area       Color Fundus Photography Optos - OU - Both Eyes       Right Eye Progression has no prior data. Disc findings include normal observations. Periphery : normal observations.   Left Eye Progression has no prior data. Disc findings include normal observations. Macula : normal observations. Vessels : normal observations. Periphery : normal observations.   Notes Partial macular infarction with near confluent cotton-wool spot nasal to the fovea, PERI papillary, astraddle a very small cilioretinal artery.  No identifiable Hollenhorst plaque  No obvious retinal vascular occlusions left eye       Aspiration of Aqueous, Diagnostic - OD - Right Eye       Lid speculum applied after proparacaine use and 3 individual drops of tobramycin delivered over 3 to 4-minute.,  Via 30-gauge needle 0.1 cc of AC tap completed without difficulty or complication       Fluorescein Angiography Optos (Transit OD)       Injection:  500 mg Fluorescein Sodium 10 %  injection   NDC: [phone]   Route: Intravenous, Site: Left ArmRight Eye   Progression has no prior data. Early phase findings include blockage, vascular perfusion defect, delayed filling. Mid/Late phase findings include vascular perfusion defect. Choroidal neovascularization is not present.   Left Eye   Progression has no prior data. Mid/Late phase findings include normal observations. Choroidal neovascularization is not present.   Notes Cilioretinal artery OD, temporal nerve to nasal macula, with delayed perfusion, with intraluminal filling voids, no overt occlusion.                ASSESSMENT/PLAN:  Posterior vitreous detachment of both eyes   The nature of posterior vitreous detachment was discussed with the patient as well as its physiology, its age prevalence, and its possible implication regarding retinal breaks and detachment.  An informational brochure was given to the patient.  All the patient's questions were answered.  The patient was asked to return if new or different flashes or floaters develops.   Patient was instructed to contact office immediately if any changes were noticed. I explained to the patient that vitreous inside the eye is similar to jello inside a bowl. As the jello melts it can start to pull away from the bowl, similarly the vitreous throughout our lives can begin to pull away from the retina. That process is called a posterior vitreous detachment. In some cases, the vitreous can tug hard enough on the retina to form a retinal tear. I discussed with the patient the signs and symptoms of a retinal detachment.  Do not rub the eye.  Branch retinal artery occlusion, right eye Force and angiography of the right eye discloses no central retinal artery occlusion or involvement  Small cilioretinal artery on the temporal aspect of the optic nerve feeding only the nasal portion of the macula is involved and yet it has thready perfusion thus incomplete branch  retinal artery occlusion with apparent volar filling void defects in the small branches which perfuse the now inner retinal layers with some retinal whitening, incomplete infarction noted on OCT  I discussed at length the use of anterior chamber tap to lower the intraocular pressure in an attempt to dislodge and to maximize perfusion pressure to hopefully allow more distal movement of any intraluminal occlusions.  We will also have him use aspirin 325 mg daily until he has follow-up with his primary medical physician      ICD-10-CM   1. Branch retinal artery occlusion, right eye  H34.231 OCT, Retina - OU - Both Eyes    Color Fundus Photography Optos - OU - Both Eyes    Aspiration of Aqueous, Diagnostic - OD - Right Eye    Fluorescein Angiography Optos (Transit OD)    Fluorescein Sodium 10 % injection 500 mg  2. Posterior vitreous detachment of both eyes  H43.813     1.  We will asked patient to follow-up with Billey Gosling can consideration a carotid Doppler evaluation within the next 1 to 2 weeks  2.  Patient is asked to use coated aspirin 325 mg daily pending further evaluation  3.  I described the patient that other sites of blockages could occur and that the the aspirin will hopefully minimize at  4.  Patient continues on Lipitor 5. Paracentesis OD, offered and performed, in attempt to transiently lower IOP, potentially dislodge blockages to a further "downstream" location.  No complications, DO NOT RUB, COMPRESS GLOBE for 24 hours.  Ophthalmic Meds Ordered this visit:  Meds ordered this encounter  Medications  . Fluorescein Sodium 10 % injection 500 mg       Return in about 2 weeks (around 02/02/2020) for dilate, COLOR FP, OD.  There are no Patient Instructions on file for this visit.   Explained the diagnoses, plan, and follow up with the patient and they expressed understanding.  Patient expressed understanding of the importance of proper follow up care.   Clent Demark  Jaan Fischel M.D. Diseases & Surgery of the Retina and Vitreous [facility]ina [facility] [place] 01/20/20     Abbreviations: M myopia (nearsighted); A astigmatism; H hyperopia (farsighted); P presbyopia; Mrx spectacle prescription;  CTL contact lenses; OD right eye; OS left eye; OU both eyes  XT exotropia; ET esotropia; PEK punctate epithelial keratitis; PEE punctate epithelial erosions; DES dry eye syndrome; MGD meibomian gland dysfunction; ATs artificial tears; PFAT's preservative free artificial tears; [place] nuclear sclerotic cataract; PSC posterior subcapsular cataract; ERM epi-retinal membrane; PVD posterior vitreous detachment; RD retinal detachment; DM diabetes mellitus; DR diabetic retinopathy; NPDR non-proliferative diabetic retinopathy; PDR proliferative diabetic retinopathy; CSME clinically significant macular edema; DME diabetic macular edema; dbh dot blot hemorrhages; CWS cotton wool spot; POAG primary open angle glaucoma; C/D cup-to-disc ratio; HVF humphrey visual field; GVF goldmann visual field; OCT optical coherence tomography; IOP intraocular pressure; BRVO Branch retinal vein occlusion; CRVO central retinal vein occlusion; CRAO central retinal artery occlusion; BRAO branch retinal artery occlusion; RT retinal tear; SB scleral buckle; PPV pars plana vitrectomy; VH Vitreous hemorrhage; PRP panretinal laser photocoagulation; IVK intravitreal kenalog; VMT vitreomacular traction; MH Macular hole;  NVD neovascularization  of the disc; NVE neovascularization elsewhere; AREDS age related eye disease study; ARMD age related macular degeneration; POAG primary open angle glaucoma; EBMD epithelial/anterior basement membrane dystrophy; ACIOL anterior chamber intraocular lens; IOL intraocular lens; PCIOL posterior chamber intraocular lens; Phaco/IOL phacoemulsification with intraocular lens placement; [place] photorefractive keratectomy; LASIK laser assisted in situ keratomileusis; HTN hypertension; DM diabetes  mellitus; COPD chronic obstructive pulmonary disease

## 2020-01-20 ENCOUNTER — Encounter: Payer: Self-pay | Admitting: Internal Medicine

## 2020-01-20 ENCOUNTER — Ambulatory Visit (INDEPENDENT_AMBULATORY_CARE_PROVIDER_SITE_OTHER): Payer: Medicare HMO | Admitting: Internal Medicine

## 2020-01-20 VITALS — BP 116/70 | HR 71 | Temp 98.2°F | Ht 75.0 in | Wt 208.4 lb

## 2020-01-20 DIAGNOSIS — E782 Mixed hyperlipidemia: Secondary | ICD-10-CM | POA: Diagnosis not present

## 2020-01-20 DIAGNOSIS — H34231 Retinal artery branch occlusion, right eye: Secondary | ICD-10-CM

## 2020-01-20 DIAGNOSIS — I10 Essential (primary) hypertension: Secondary | ICD-10-CM | POA: Diagnosis not present

## 2020-01-20 MED ORDER — ATORVASTATIN CALCIUM 40 MG PO TABS: 40.0000 mg | ORAL_TABLET | Freq: Every day | ORAL | 3 refills | Status: DC

## 2020-01-20 MED ORDER — FLUORESCEIN SODIUM 10 % IV SOLN
500.0000 mg | INTRAVENOUS | Status: AC | PRN
  Administered 2020-01-20: 500 mg via INTRAVENOUS

## 2020-01-20 NOTE — Assessment & Plan Note (Signed)
Chronic Currently taking atorvastatin 20 mg daily-LDL near goal, but not quite at goal of less than 70.  Given this recent retinal artery branch occlusion will increase atorvastatin to 40 mg daily Will recheck lipids in 2 weeks at his physical

## 2020-01-20 NOTE — Patient Instructions (Addendum)
Increase your atorvastatin to 40 mg daily.    A carotid artery ultrasound was ordered.    An Echocardiogram was ordered.    A referral was ordered for cardiology.

## 2020-01-20 NOTE — Assessment & Plan Note (Signed)
Chronic BP well controlled and I do not think it is too low Continue telmisartan-hydrochlorothiazide 80-25 mg daily

## 2020-01-20 NOTE — Progress Notes (Signed)
Subjective:    Patient ID: Charles Tate, male    DOB: [birth year], 69 y.o.   MRN: [id number]  HPI The patient is here for an acute visit.   Right eye with gray cloud vision yesterday - saw his optometrist who referred him to dr Zadie Rhine.  He was diagnosed with a retinal artery branch occlusion.  There was no central retinal artery occlusion or involvement.  He did a procedure in the office and there has been some improvement in the vision since yesterday.  It is still not 100% back to normal.  He did start him on 325 mg of aspirin daily.  He advised follow-up with me.   He denies any palpitations, chest pain or shortness of breath.  He denies headaches.  He is taking his Lipitor daily.   Medications and allergies reviewed with patient and updated if appropriate.  Patient Active Problem List   Diagnosis Date Noted  . Branch retinal artery occlusion, right eye 01/19/2020  . Posterior vitreous detachment of both eyes 01/19/2020  . Pink eye, left 10/16/2019  . 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 Tear of MCL (medial collateral ligament) of knee, left, initial encounter 08/05/2017  . Gluteal tendinitis of left buttock 08/05/2017  . Left inguinal hernia 07/04/2017  . Allergic rhinitis 12/28/2015  . H/O: rheumatic fever 12/28/2015  . Undiagnosed cardiac murmurs 12/28/2015  . Prediabetes 12/27/2015  . Rising PSA level 02/21/2014  . Hx of colonic polyp 02/17/2014  . 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 Hyperlipidemia 01/07/2009  . Asthma 06/04/2007  . Essential hypertension 09/26/2006    Current Outpatient Medications on File Prior to Visit  Medication Sig Dispense Refill  . albuterol (PROAIR HFA) 108 (90 Base) MCG/ACT inhaler Inhale 2 puffs into the lungs every 6 (six) hours as needed for wheezing or shortness of breath. 1 each 0  . atorvastatin (LIPITOR) 20 MG tablet Take 1 tablet (20 mg total) by mouth daily. 90  tablet 3  . azelastine (ASTELIN) 0.1 % nasal spray Place 1 spray into both nostrils 2 (two) times daily. Use in each nostril as directed 30 mL 12  . fluticasone (FLONASE) 50 MCG/ACT nasal spray Place 2 sprays into both nostrils daily. 16 g 6  . Misc Natural Products (SUPER GREENS PO) Take 1 Scoop by mouth daily.     . Multiple Vitamin (MULTIVITAMIN) capsule Take 1 capsule by mouth daily.      . Sarecycline HCl (SEYSARA) 150 MG TABS Take by mouth. Given by derm after procedure last week    . telmisartan-hydrochlorothiazide (MICARDIS HCT) 80-25 MG tablet TAKE 1 TABLET ONCE DAILY, NEED OFFICE VISIT BEFORE ANY MORE REFILLS 30 tablet 2   No current facility-administered medications on file prior to visit.    Past Medical History:  Diagnosis Date  . Allergy   . Arthritis    left hip   . GERD (gastroesophageal reflux disease)   . Heart murmur    adolescent rheumatic fever - developed murmur   . Hyperlipidemia   . Hypertension   . RAD (reactive airway disease)    with RTIs  . Tubular adenoma     Past Surgical History:  Procedure Laterality Date  . COLONOSCOPY    . colonoscopy with polypectomy  2013   Dr Ferdinand Lango, [facility]  . COLONOSCOPY WITH PROPOFOL N/A 07/01/2019   Procedure: COLONOSCOPY WITH PROPOFOL;  Surgeon: Rush Landmark Telford Nab., MD;  Location: [facility];  Service: Gastroenterology;  Laterality: N/A;  . ENDOSCOPIC MUCOSAL RESECTION N/A 07/01/2019   Procedure: ENDOSCOPIC MUCOSAL RESECTION;  Surgeon: Rush Landmark Telford Nab., MD;  Location: [facility];  Service: Gastroenterology;  Laterality: N/A;  . HEMOSTASIS CLIP PLACEMENT  07/01/2019   Procedure: HEMOSTASIS CLIP PLACEMENT;  Surgeon: Irving Copas., MD;  Location: [facility];  Service: Gastroenterology;;  . POLYPECTOMY    . POLYPECTOMY  07/01/2019   Procedure: POLYPECTOMY;  Surgeon: Mansouraty, Telford Nab., MD;  Location: [facility];  Service: Gastroenterology;;  . Lia Foyer LIFTING INJECTION  07/01/2019    Procedure: SUBMUCOSAL LIFTING INJECTION;  Surgeon: Irving Copas., MD;  Location: [facility];  Service: Gastroenterology;;  . Lia Foyer TATTOO INJECTION  07/01/2019   Procedure: SUBMUCOSAL TATTOO INJECTION;  Surgeon: Irving Copas., MD;  Location: [facility];  Service: Gastroenterology;;  . Arnetha Courser TOOTH EXTRACTION      Social History   Socioeconomic History  . Marital status: Married    Spouse name: [name]  . Number of children: 2  . Years of education: Not on file  . Highest education level: Not on file  Occupational History  . Occupation: self employed   Tobacco Use  . Smoking status: Former Research scientist (life sciences)  . Smokeless tobacco: Never Used  . Tobacco comment: intermittent, short term smoker as a teen. Some second hand smoke in 56s & 30s  Vaping Use  . Vaping Use: Never used  Substance and Sexual Activity  . Alcohol use: Yes    Comment:   very rarely  . Drug use: No  . Sexual activity: Not on file  Other Topics Concern  . Not on file  Social History Narrative   Exercise: none regularly   Social Determinants of Health   Financial Resource Strain:   . Difficulty of Paying Living Expenses: Not on file  Food Insecurity:   . Worried About Charity fundraiser in the Last Year: Not on file  . Ran Out of Food in the Last Year: Not on file  Transportation Needs:   . Lack of Transportation (Medical): Not on file  . Lack of Transportation (Non-Medical): Not on file  Physical Activity:   . Days of Exercise per Week: Not on file  . Minutes of Exercise per Session: Not on file  Stress:   . Feeling of Stress : Not on file  Social Connections:   . Frequency of Communication with Friends and Family: Not on file  . Frequency of Social Gatherings with Friends and Family: Not on file  . Attends Religious Services: Not on file  . Active Member of Clubs or Organizations: Not on file  . Attends Archivist Meetings: Not on file  . Marital Status: Not on file     Family History  Problem Relation Age of Onset  . Arthritis Mother   . Hyperlipidemia Mother   . Lupus Mother   . Heart attack Father 1  . Hypertension Sister   . Heart attack Paternal Grandmother 31  . Diabetes Paternal Aunt   . Stroke Maternal Grandfather 58  . Cancer Paternal Grandfather        ? stomach; in 68s  . Colon cancer Paternal Grandfather   . Colon polyps Neg Hx   . Esophageal cancer Neg Hx   . Rectal cancer Neg Hx   . Stomach cancer Neg Hx   . Inflammatory bowel disease Neg Hx   . Liver disease Neg Hx   . Pancreatic cancer Neg Hx     Review  of Systems  Constitutional: Negative for fever.  Respiratory: Negative for shortness of breath.   Cardiovascular: Negative for chest pain and palpitations.  Neurological: Negative for headaches.       Objective:   Vitals:   01/20/20 1147  BP: 116/70  Pulse: 71  Temp: 98.2 F (36.8 C)  SpO2: 97%   BP Readings from Last 3 Encounters:  01/20/20 116/70  11/19/19 (!) 147/81  10/15/19 122/78   Wt Readings from Last 3 Encounters:  01/20/20 208 lb 6.4 oz (94.5 kg)  10/15/19 207 lb (93.9 kg)  09/29/19 (!) 209 lb (94.8 kg)   Body mass index is 26.05 kg/m.   Physical Exam    Constitutional: Appears well-developed and well-nourished. No distress.  Head: Normocephalic and atraumatic.  Neck: Neck supple. No tracheal deviation present. No thyromegaly present.  No cervical lymphadenopathy Cardiovascular: Normal rate, regular rhythm and normal heart sounds.  No murmur heard. No carotid bruit .  No edema Pulmonary/Chest: Effort normal and breath sounds normal. No respiratory distress. No has no wheezes. No rales.  Skin: Skin is warm and dry. Not diaphoretic.  Psychiatric: Normal mood and affect. Behavior is normal.       Assessment & Plan:    Branch retinal artery occlusion, right eye: Saw Dr. Zadie Rhine yesterday-there has been some improvement since yesterday and he has follow-up with him in a couple of  weeks Continue aspirin 325 mg daily-not sure how long he should continue this dosing We will check carotid artery ultrasound, echocardiogram and refer to cardiology Exam is normal.  No symptoms to suggest atrial fibrillation Will increase atorvastatin to 40 mg daily He will follow up with me in 2 weeks    See Problem List for Assessment and Plan of chronic medical problems.    This visit occurred during the SARS-CoV-2 public health emergency.  Safety protocols were in place, including screening questions prior to the visit, additional usage of staff PPE, and extensive cleaning of exam room while observing appropriate contact time as indicated for disinfecting solutions.

## 2020-01-21 ENCOUNTER — Ambulatory Visit (HOSPITAL_COMMUNITY): Payer: Medicare HMO | Attending: Cardiovascular Disease

## 2020-01-21 ENCOUNTER — Other Ambulatory Visit: Payer: Self-pay

## 2020-01-21 DIAGNOSIS — H34231 Retinal artery branch occlusion, right eye: Secondary | ICD-10-CM | POA: Diagnosis not present

## 2020-01-21 LAB — ECHOCARDIOGRAM COMPLETE
Area-P 1/2: 3.21 cm2
S' Lateral: 1.9 cm

## 2020-01-25 ENCOUNTER — Ambulatory Visit (HOSPITAL_COMMUNITY)
Admission: RE | Admit: 2020-01-25 | Discharge: 2020-01-25 | Disposition: A | Discharge status: Home / Self Care | Payer: Medicare HMO | Source: Ambulatory Visit | Attending: Internal Medicine | Admitting: Internal Medicine

## 2020-01-25 DIAGNOSIS — H34231 Retinal artery branch occlusion, right eye: Secondary | ICD-10-CM | POA: Diagnosis not present

## 2020-01-26 ENCOUNTER — Encounter: Payer: Self-pay | Admitting: Internal Medicine

## 2020-01-26 DIAGNOSIS — I6521 Occlusion and stenosis of right carotid artery: Secondary | ICD-10-CM | POA: Insufficient documentation

## 2020-01-26 DIAGNOSIS — I6529 Occlusion and stenosis of unspecified carotid artery: Secondary | ICD-10-CM | POA: Insufficient documentation

## 2020-01-31 NOTE — Progress Notes (Signed)
Cardiology Office Note:    Date:  02/02/2020   ID:  Charles Tate, DOB 1950-11-01, MRN [id number]  PCP:  [name], MD  Cardiologist:  No primary care provider on file.   Referring MD: [name], MD   Chief Complaint  Patient presents with  . Coronary Artery Disease  . Cerebrovascular Accident    Right retinal branch occlusion, embolic    History of Present Illness:    Charles Tate is a 69 y.o. 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 He has h/o CAD (elevated Cor Calc. Score), hyperlipidemia, primary hypertension, and rheumatic fever.  He had right eye retinal branch occlusion with unexplained etiology.  Carotid Dopplers demonstrated bulky plaque in the right internal carotid.  He has not had palpitations to suggest atrial fibrillation.  He does have generalized atherosclerosis as denoted by his carotid Doppler and coronary calcium score.  Plaque burden appears to be relatively low.  Earlier this month he developed right eye scotoma that he describes as a gray cloud.  He saw ophthalmologist and ultimately was referred to Dr. Zadie Rhine who demonstrated branch right retinal artery occlusion.  He did not call it a Hollenhorst crystal.  Vision subsequently improved.  He has had no other neurological complaints transient or prolonged.  He has had some mild frontal headache.  Past Medical History:  Diagnosis Date  . Allergy   . Arthritis    left hip   . GERD (gastroesophageal reflux disease)   . Heart murmur    adolescent rheumatic fever - developed murmur   . Hyperlipidemia   . Hypertension   . RAD (reactive airway disease)    with RTIs  . Tubular adenoma     Past Surgical History:  Procedure Laterality Date  . COLONOSCOPY    . colonoscopy with polypectomy  2013   Dr Ferdinand Lango, [facility]  . COLONOSCOPY WITH PROPOFOL N/A 07/01/2019   Procedure: COLONOSCOPY WITH PROPOFOL;  Surgeon: Rush Landmark Telford Nab., MD;   Location: [facility];  Service: Gastroenterology;  Laterality: N/A;  . ENDOSCOPIC MUCOSAL RESECTION N/A 07/01/2019   Procedure: ENDOSCOPIC MUCOSAL RESECTION;  Surgeon: Rush Landmark Telford Nab., MD;  Location: [facility];  Service: Gastroenterology;  Laterality: N/A;  . HEMOSTASIS CLIP PLACEMENT  07/01/2019   Procedure: HEMOSTASIS CLIP PLACEMENT;  Surgeon: Irving Copas., MD;  Location: [facility];  Service: Gastroenterology;;  . POLYPECTOMY    . POLYPECTOMY  07/01/2019   Procedure: POLYPECTOMY;  Surgeon: Mansouraty, Telford Nab., MD;  Location: [facility];  Service: Gastroenterology;;  . Lia Foyer LIFTING INJECTION  07/01/2019   Procedure: SUBMUCOSAL LIFTING INJECTION;  Surgeon: Irving Copas., MD;  Location: [facility];  Service: Gastroenterology;;  . Lia Foyer TATTOO INJECTION  07/01/2019   Procedure: SUBMUCOSAL TATTOO INJECTION;  Surgeon: Irving Copas., MD;  Location: [facility];  Service: Gastroenterology;;  . Arnetha Courser TOOTH EXTRACTION      Current Medications: Current Meds  Medication Sig  . atorvastatin (LIPITOR) 40 MG tablet Take 1 tablet (40 mg total) by mouth daily.  Marland Kitchen azelastine (ASTELIN) 0.1 % nasal spray Place 1 spray into both nostrils 2 (two) times daily. Use in each nostril as directed  . fluticasone (FLONASE) 50 MCG/ACT nasal spray Place 2 sprays into both nostrils daily.  . Misc Natural Products (SUPER GREENS PO) Take 1 Scoop by mouth daily.   . Multiple Vitamin (MULTIVITAMIN) capsule Take 1 capsule by mouth daily.    Marland Kitchen telmisartan-hydrochlorothiazide (MICARDIS HCT)  80-25 MG tablet TAKE 1 TABLET ONCE DAILY, NEED OFFICE VISIT BEFORE ANY MORE REFILLS     Allergies:   Ciprofloxacin hcl, Cefdinir, and Lisinopril   Social History   Socioeconomic History  . Marital status: Married    Spouse name: [name]  . Number of children: 2  . Years of education: Not on file  . Highest education level: Not on file  Occupational History  .  Occupation: self employed   Tobacco Use  . Smoking status: Former Research scientist (life sciences)  . Smokeless tobacco: Never Used  . Tobacco comment: intermittent, short term smoker as a teen. Some second hand smoke in 65s & 30s  Vaping Use  . Vaping Use: Never used  Substance and Sexual Activity  . Alcohol use: Yes    Comment:   very rarely  . Drug use: No  . Sexual activity: Not on file  Other Topics Concern  . Not on file  Social History Narrative   Exercise: none regularly   Social Determinants of Health   Financial Resource Strain:   . Difficulty of Paying Living Expenses: Not on file  Food Insecurity:   . Worried About Charity fundraiser in the Last Year: Not on file  . Ran Out of Food in the Last Year: Not on file  Transportation Needs:   . Lack of Transportation (Medical): Not on file  . Lack of Transportation (Non-Medical): Not on file  Physical Activity:   . Days of Exercise per Week: Not on file  . Minutes of Exercise per Session: Not on file  Stress:   . Feeling of Stress : Not on file  Social Connections:   . Frequency of Communication with Friends and Family: Not on file  . Frequency of Social Gatherings with Friends and Family: Not on file  . Attends Religious Services: Not on file  . Active Member of Clubs or Organizations: Not on file  . Attends Archivist Meetings: Not on file  . Marital Status: Not on file     Family History: The patient's family history includes Arthritis in his mother; Cancer in his paternal grandfather; Colon cancer in his paternal grandfather; Diabetes in his paternal aunt; Heart attack (age of onset: 91) in his father and paternal grandmother; Hyperlipidemia in his mother; Hypertension in his sister; Lupus in his mother; Stroke (age of onset: 76) in his maternal grandfather. There is no history of Colon polyps, Esophageal cancer, Rectal cancer, Stomach cancer, Inflammatory bowel disease, Liver disease, or Pancreatic cancer.  ROS:   Please see  the history of present illness.    Tends to exercise.  Health conscious.  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  He had an engagement this weekend and did well.  Work-up for the retinal artery occlusion has been spearheaded by his physician Dr. Billey Gosling MD.  All other systems reviewed and are negative.  EKGs/Labs/Other Studies Reviewed:    The following studies were reviewed today:  2D DOPPLER ECHOCARDIOGRAM 01/21/2020 IMPRESSIONS    1. Left ventricular ejection fraction, by estimation, is 65 to 70%. The  left ventricle has normal function. The left ventricle has no regional  wall motion abnormalities. There is mild left ventricular hypertrophy.  Left ventricular diastolic parameters  are consistent with Grade I diastolic dysfunction (impaired relaxation).  2. Right ventricular systolic function is normal. The right ventricular  size is normal.  3. The mitral valve is normal in structure. Trivial mitral valve  regurgitation.  4. The aortic  valve is normal in structure. Aortic valve regurgitation is  not visualized. No aortic stenosis is present.   CAROTID DOPPLER 01/25/2020: Summary:  Right Carotid: Velocities in the right ICA are consistent with a 40-59%         stenosis.   Left Carotid: Velocities in the left ICA are consistent with a 1-39%  stenosis.   Vertebrals: Left vertebral artery demonstrates antegrade flow. Small  caliber        right vertebral artery with atypical antegrade flow noted.  Subclavians: Normal flow hemodynamics were seen in bilateral subclavian        arteries.   CORONARY CALCIUM SCORE 08/2019:  IMPRESSION: Coronary calcium score of 55.5 Agatston units. This was 34th percentile for age and sex matched control, suggesting low risk for future cardiac events.  Dalton Mclean  EKG:  EKG normal sinus rhythm with nonspecific ST abnormality.  Biatrial abnormality.  No change compared to prior.  Recent Labs: 08/14/2019: BUN  17; Creatinine, Ser 0.84; Potassium 3.9; Sodium 136 09/01/2019: Hemoglobin 14.7; Platelets 173.0 09/29/2019: ALT 21  Recent Lipid Panel    Component Value Date/Time   CHOL 138 09/29/2019 1604   CHOL 175 02/10/2013 0824   TRIG 122 09/29/2019 1604   TRIG 131 02/10/2013 0824   HDL 33 (L) 09/29/2019 1604   HDL 31 (L) 02/10/2013 0824   CHOLHDL 4.2 09/29/2019 1604   VLDL 19.8 08/14/2019 0953   LDLCALC 83 09/29/2019 1604   LDLCALC 118 (H) 02/10/2013 0824   LDLDIRECT 161.0 12/05/2018 1422    Physical Exam:    VS:  BP 126/70   Pulse 71   Ht 6\' 3"  (1.905 m)   Wt 210 lb 9.6 oz (95.5 kg)   SpO2 96%   BMI 26.32 kg/m     Wt Readings from Last 3 Encounters:  02/02/20 210 lb 9.6 oz (95.5 kg)  01/20/20 208 lb 6.4 oz (94.5 kg)  10/15/19 207 lb (93.9 kg)     GEN: Compatible with age. No acute distress HEENT: Normal NECK: No JVD.  No carotid bruit LYMPHATICS: No lymphadenopathy CARDIAC:  RRR without murmur, gallop, or edema. VASCULAR:  Normal Pulses. No bruits. RESPIRATORY:  Clear to auscultation without rales, wheezing or rhonchi  ABDOMEN: Soft, non-tender, non-distended, No pulsatile mass, MUSCULOSKELETAL: No deformity  SKIN: Warm and dry NEUROLOGIC:  Alert and oriented x 3 PSYCHIATRIC:  Normal affect   ASSESSMENT:    1. Branch retinal artery occlusion, right eye   2. Coronary artery disease involving native artery of transplanted heart without angina pectoris   3. Essential hypertension   4. Mixed hyperlipidemia   5. Stenosis of carotid artery, unspecified laterality   6. 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)   7. Educated about COVID-19 virus infection   8. Acute embolic stroke ([place])    PLAN:    In order of problems listed above:  1. Assume embolic etiology and with significant bulky plaque in the right carotid, this raises a question of an ulcerated plaque.  Not sure if there is anything else to be done but I would like for him to see one of the  vascular surgery doctors to get an opinion.  Perhaps he needs a CT angiogram to evaluate carotid plaque morphology.  Agree with aspirin 325 mg/day and increased management of elevated cholesterol levels.  A 30-day monitor will be performed to exclude atrial fibrillation. 2. Primary prevention as outlined below is discussed 3. Target blood pressure 130/80 millimeters or less.  Continue Micardis HCT 80/25 mg/day. 4. Continue high intensity statin therapy with Lipitor 40 mg/day.  Target less than 70 mg/dL for LDL. 5. Discussed under #2 above 6. Vaccinated and practicing social distancing 7. Embolic event to his retinal artery branch is an acute ischemic stroke-like syndrome.  30-day monitor to exclude atrial fibrillation.   Overall education and awareness concerning secondary risk prevention was discussed in detail: LDL less than 70, hemoglobin A1c less than 7, blood pressure target less than 130/80 mmHg, >150 minutes of moderate aerobic activity per week, avoidance of smoking, weight control (via diet and exercise), and continued surveillance/management of/for obstructive sleep apnea.   Medication Adjustments/Labs and Tests Ordered: Current medicines are reviewed at length with the patient today.  Concerns regarding medicines are outlined above.  Orders Placed This Encounter  Procedures  . Ambulatory referral to Vascular Surgery  . Cardiac event monitor  . EKG 12-Lead   No orders of the defined types were placed in this encounter.   Patient Instructions  Medication Instructions:  Your physician recommends that you continue on your current medications as directed. Please refer to the Current Medication list given to you today.  *If you need a refill on your cardiac medications before your next appointment, please call your pharmacy*   Lab Work: None If you have labs (blood work) drawn today and your tests are completely normal, you will receive your results only by: Marland Kitchen MyChart Message  (if you have MyChart) OR . A paper copy in the mail If you have any lab test that is abnormal or we need to change your treatment, we will call you to review the results.   Testing/Procedures: Your physician has recommended that you wear an event monitor. Event monitors are medical devices that record the heart's electrical activity. Doctors most often [place] these monitors to diagnose arrhythmias. Arrhythmias are problems with the speed or rhythm of the heartbeat. The monitor is a small, portable device. You can wear one while you do your normal daily activities. This is usually used to diagnose what is causing palpitations/syncope (passing out).    Follow-Up: At [facility], you and your health needs are our priority.  As part of our continuing mission to provide you with exceptional heart care, we have created designated Provider Care Teams.  These Care Teams include your primary Cardiologist (physician) and Advanced Practice Providers (APPs -  Physician Assistants and Nurse Practitioners) who all work together to provide you with the care you need, when you need it.  We recommend signing up for the patient portal called "MyChart".  Sign up information is provided on this After Visit Summary.  MyChart is used to connect with patients for Virtual Visits (Telemedicine).  Patients are able to view lab/test results, encounter notes, upcoming appointments, etc.  Non-urgent messages can be sent to your provider as well.   To learn more about what you can do with MyChart, go to [email or web address].    Your next appointment:   12 month(s)  The format for your next appointment:   In Person  Provider:   You may see Dr. Daneen Schick or one of the following Advanced Practice Providers on your designated Care Team:    Truitt Merle, NP  Cecilie Kicks, NP  Kathyrn Drown, NP    Other Instructions  You have been referred to the Vascular Specialist.  That office will reach out to get you  scheduled.  [employer] Cardiac Event Monitor Instructions Your physician has requested you wear your  cardiac event monitor for 30 days. [employer] may call or text to confirm a shipping address. The monitor will be sent to a land address via UPS. [employer] will not ship a monitor to a PO BOX. It typically takes 3-5 days to receive your monitor after it has been enrolled. [employer] will assist with USPS tracking if your package is delayed. The telephone number for [employer] is [phone]. Once you have received your monitor, please review the enclosed instructions. Instruction tutorials can also be viewed under help and settings on the enclosed cell phone. Your monitor has already been registered assigning a specific monitor serial # to you.  Applying the monitor Remove cell phone from case and turn it on. The cell phone works as Dealer and needs to be within [employer] of you at all times. The cell phone will need to be charged on a daily basis. We recommend you plug the cell phone into the enclosed charger at your bedside table every night.  Monitor batteries: You will receive two monitor batteries labelled #1 and #2. These are your recorders. Plug battery #2 onto the second connection on the enclosed charger. Keep one battery on the charger at all times. This will keep the monitor battery deactivated. It will also keep it fully charged for when you need to switch your monitor batteries. A small light will be blinking on the battery emblem when it is charging. The light on the battery emblem will remain on when the battery is fully charged.  Open package of a Monitor strip. Insert battery #1 into black hood on strip and gently squeeze monitor battery onto connection as indicated in instruction booklet. Set aside while preparing skin.  Choose location for your strip, vertical or horizontal, as indicated in the instruction booklet. Shave to remove all hair from location. There  cannot be any lotions, oils, powders, or colognes on skin where monitor is to be applied. Wipe skin clean with enclosed Saline wipe. Dry skin completely.  Peel paper labeled #1 off the back of the Monitor strip exposing the adhesive. Place the monitor on the chest in the vertical or horizontal position shown in the instruction booklet. One arrow on the monitor strip must be pointing upward. Carefully remove paper labeled #2, attaching remainder of strip to your skin. Try not to create any folds or wrinkles in the strip as you apply it.  Firmly press and release the circle in the center of the monitor battery. You will hear a small beep. This is turning the monitor battery on. The heart emblem on the monitor battery will light up every 5 seconds if the monitor battery in turned on and connected to the patient securely. Do not push and hold the circle down as this turns the monitor battery off. The cell phone will locate the monitor battery. A screen will appear on the cell phone checking the connection of your monitor strip. This may read poor connection initially but change to good connection within the next minute. Once your monitor accepts the connection you will hear a series of 3 beeps followed by a climbing crescendo of beeps. A screen will appear on the cell phone showing the two monitor strip placement options. Touch the picture that demonstrates where you applied the monitor strip.  Your monitor strip and battery are waterproof. You are able to shower, bathe, or swim with the monitor on. They just ask you do not submerge deeper than 3 feet underwater. We recommend removing the monitor  if you are swimming in a lake, river, or ocean.  Your monitor battery will need to be switched to a fully charged monitor battery approximately once a week. The cell phone will alert you of an action which needs to be made.  On the cell phone, tap for details to reveal connection status, monitor battery  status, and cell phone battery status. The green dots indicates your monitor is in good status. A red dot indicates there is something that needs your attention.  To record a symptom, click the circle on the monitor battery. In 30-60 seconds a list of symptoms will appear on the cell phone. Select your symptom and tap save. Your monitor will record a sustained or significant arrhythmia regardless of you clicking the button. Some patients do not feel the heart rhythm irregularities. [employer] will notify us of any serious or critical events.  Refer to instruction booklet for instructions on switching batteries, changing strips, the Do not disturb or Pause features, or any additional questions.  Call [employer] at [phone], to confirm your monitor is transmitting and record your baseline. They will answer any questions you may have regarding the monitor instructions at that time.  Returning the monitor to [place] all equipment back into blue box. Peel off strip of paper to expose adhesive and close box securely. There is a prepaid UPS shipping label on this box. Drop in a UPS drop box, or at a UPS facility like [employer]. You may also contact [employer] to arrange UPS to pick up monitor package at your home.      Signed, Sinclair Grooms, MD  02/02/2020 3:07 PM    [facility]

## 2020-02-02 ENCOUNTER — Ambulatory Visit: Payer: Medicare HMO | Admitting: Interventional Cardiology

## 2020-02-02 ENCOUNTER — Other Ambulatory Visit: Payer: Self-pay

## 2020-02-02 ENCOUNTER — Encounter: Payer: Self-pay | Admitting: *Deleted

## 2020-02-02 ENCOUNTER — Encounter: Payer: Self-pay | Admitting: Interventional Cardiology

## 2020-02-02 VITALS — BP 126/70 | HR 71 | Ht 75.0 in | Wt 210.6 lb

## 2020-02-02 DIAGNOSIS — L820 Inflamed seborrheic keratosis: Secondary | ICD-10-CM | POA: Diagnosis not present

## 2020-02-02 DIAGNOSIS — D485 Neoplasm of uncertain behavior of skin: Secondary | ICD-10-CM | POA: Diagnosis not present

## 2020-02-02 DIAGNOSIS — I25811 Atherosclerosis of native coronary artery of transplanted heart without angina pectoris: Secondary | ICD-10-CM | POA: Diagnosis not present

## 2020-02-02 DIAGNOSIS — H34231 Retinal artery branch occlusion, right eye: Secondary | ICD-10-CM | POA: Diagnosis not present

## 2020-02-02 DIAGNOSIS — E782 Mixed hyperlipidemia: Secondary | ICD-10-CM | POA: Diagnosis not present

## 2020-02-02 DIAGNOSIS — I6529 Occlusion and stenosis of unspecified carotid artery: Secondary | ICD-10-CM | POA: Diagnosis not present

## 2020-02-02 DIAGNOSIS — R931 Abnormal findings on diagnostic imaging of heart and coronary circulation: Secondary | ICD-10-CM | POA: Diagnosis not present

## 2020-02-02 DIAGNOSIS — Z7189 Other specified counseling: Secondary | ICD-10-CM | POA: Diagnosis not present

## 2020-02-02 DIAGNOSIS — I639 Cerebral infarction, unspecified: Secondary | ICD-10-CM

## 2020-02-02 DIAGNOSIS — I10 Essential (primary) hypertension: Secondary | ICD-10-CM | POA: Diagnosis not present

## 2020-02-02 NOTE — Patient Instructions (Signed)
Medication Instructions:  Your physician recommends that you continue on your current medications as directed. Please refer to the Current Medication list given to you today.  *If you need a refill on your cardiac medications before your next appointment, please call your pharmacy*   Lab Work: None If you have labs (blood work) drawn today and your tests are completely normal, you will receive your results only by: Marland Kitchen MyChart Message (if you have MyChart) OR . A paper copy in the mail If you have any lab test that is abnormal or we need to change your treatment, we will call you to review the results.   Testing/Procedures: Your physician has recommended that you wear an event monitor. Event monitors are medical devices that record the heart's electrical activity. Doctors most often [place] these monitors to diagnose arrhythmias. Arrhythmias are problems with the speed or rhythm of the heartbeat. The monitor is a small, portable device. You can wear one while you do your normal daily activities. This is usually used to diagnose what is causing palpitations/syncope (passing out).    Follow-Up: At [facility], you and your health needs are our priority.  As part of our continuing mission to provide you with exceptional heart care, we have created designated Provider Care Teams.  These Care Teams include your primary Cardiologist (physician) and Advanced Practice Providers (APPs -  Physician Assistants and Nurse Practitioners) who all work together to provide you with the care you need, when you need it.  We recommend signing up for the patient portal called "MyChart".  Sign up information is provided on this After Visit Summary.  MyChart is used to connect with patients for Virtual Visits (Telemedicine).  Patients are able to view lab/test results, encounter notes, upcoming appointments, etc.  Non-urgent messages can be sent to your provider as well.   To learn more about what you can do with  MyChart, go to [email or web address].    Your next appointment:   12 month(s)  The format for your next appointment:   In Person  Provider:   You may see Dr. Daneen Schick or one of the following Advanced Practice Providers on your designated Care Team:    Truitt Merle, NP  Cecilie Kicks, NP  Kathyrn Drown, NP    Other Instructions  You have been referred to the Vascular Specialist.  That office will reach out to get you scheduled.  [employer] Cardiac Event Monitor Instructions Your physician has requested you wear your cardiac event monitor for 30 days. [employer] may call or text to confirm a shipping address. The monitor will be sent to a land address via UPS. [employer] will not ship a monitor to a PO BOX. It typically takes 3-5 days to receive your monitor after it has been enrolled. [employer] will assist with USPS tracking if your package is delayed. The telephone number for [employer] is [phone]. Once you have received your monitor, please review the enclosed instructions. Instruction tutorials can also be viewed under help and settings on the enclosed cell phone. Your monitor has already been registered assigning a specific monitor serial # to you.  Applying the monitor Remove cell phone from case and turn it on. The cell phone works as Dealer and needs to be within [employer] of you at all times. The cell phone will need to be charged on a daily basis. We recommend you plug the cell phone into the enclosed charger at your bedside table every night.  Monitor batteries: You will  receive two monitor batteries labelled #1 and #2. These are your recorders. Plug battery #2 onto the second connection on the enclosed charger. Keep one battery on the charger at all times. This will keep the monitor battery deactivated. It will also keep it fully charged for when you need to switch your monitor batteries. A small light will be blinking on the battery emblem when  it is charging. The light on the battery emblem will remain on when the battery is fully charged.  Open package of a Monitor strip. Insert battery #1 into black hood on strip and gently squeeze monitor battery onto connection as indicated in instruction booklet. Set aside while preparing skin.  Choose location for your strip, vertical or horizontal, as indicated in the instruction booklet. Shave to remove all hair from location. There cannot be any lotions, oils, powders, or colognes on skin where monitor is to be applied. Wipe skin clean with enclosed Saline wipe. Dry skin completely.  Peel paper labeled #1 off the back of the Monitor strip exposing the adhesive. Place the monitor on the chest in the vertical or horizontal position shown in the instruction booklet. One arrow on the monitor strip must be pointing upward. Carefully remove paper labeled #2, attaching remainder of strip to your skin. Try not to create any folds or wrinkles in the strip as you apply it.  Firmly press and release the circle in the center of the monitor battery. You will hear a small beep. This is turning the monitor battery on. The heart emblem on the monitor battery will light up every 5 seconds if the monitor battery in turned on and connected to the patient securely. Do not push and hold the circle down as this turns the monitor battery off. The cell phone will locate the monitor battery. A screen will appear on the cell phone checking the connection of your monitor strip. This may read poor connection initially but change to good connection within the next minute. Once your monitor accepts the connection you will hear a series of 3 beeps followed by a climbing crescendo of beeps. A screen will appear on the cell phone showing the two monitor strip placement options. Touch the picture that demonstrates where you applied the monitor strip.  Your monitor strip and battery are waterproof. You are able to shower,  bathe, or swim with the monitor on. They just ask you do not submerge deeper than 3 feet underwater. We recommend removing the monitor if you are swimming in a lake, river, or ocean.  Your monitor battery will need to be switched to a fully charged monitor battery approximately once a week. The cell phone will alert you of an action which needs to be made.  On the cell phone, tap for details to reveal connection status, monitor battery status, and cell phone battery status. The green dots indicates your monitor is in good status. A red dot indicates there is something that needs your attention.  To record a symptom, click the circle on the monitor battery. In 30-60 seconds a list of symptoms will appear on the cell phone. Select your symptom and tap save. Your monitor will record a sustained or significant arrhythmia regardless of you clicking the button. Some patients do not feel the heart rhythm irregularities. [employer] will notify us of any serious or critical events.  Refer to instruction booklet for instructions on switching batteries, changing strips, the Do not disturb or Pause features, or any additional questions.  Call  [employer] at [phone], to confirm your monitor is transmitting and record your baseline. They will answer any questions you may have regarding the monitor instructions at that time.  Returning the monitor to Magnet all equipment back into blue box. Peel off strip of paper to expose adhesive and close box securely. There is a prepaid UPS shipping label on this box. Drop in a UPS drop box, or at a UPS facility like [employer]. You may also contact [employer] to arrange UPS to pick up monitor package at your home.

## 2020-02-02 NOTE — Progress Notes (Signed)
Patient ID: Charles Tate, male   DOB: [birth year], 69 y.o.   MRN: [id number] Patient enrolled for [employer] to ship a 30 day cardiac event monitor to his home.

## 2020-02-02 NOTE — Patient Instructions (Addendum)
Blood work was ordered.     Flu immunization administered today.   Medications changes include :   none  Your prescription(s) have been submitted to your pharmacy.     Please followup in 1 year    Health Maintenance, Male Adopting a healthy lifestyle and getting preventive care are important in promoting health and wellness. Ask your health care provider about:  The right schedule for you to have regular tests and exams.  Things you can do on your own to prevent diseases and keep yourself healthy. What should I know about diet, weight, and exercise? Eat a healthy diet   Eat a diet that includes plenty of vegetables, fruits, low-fat dairy products, and lean protein.  Do not eat a lot of foods that are high in solid fats, added sugars, or sodium. Maintain a healthy weight Body mass index (BMI) is a measurement that can be used to identify possible weight problems. It estimates body fat based on height and weight. Your health care provider can help determine your BMI and help you achieve or maintain a healthy weight. Get regular exercise Get regular exercise. This is one of the most important things you can do for your health. Most adults should:  Exercise for at least 150 minutes each week. The exercise should increase your heart rate and make you sweat (moderate-intensity exercise).  Do strengthening exercises at least twice a week. This is in addition to the moderate-intensity exercise.  Spend less time sitting. Even light physical activity can be beneficial. Watch cholesterol and blood lipids Have your blood tested for lipids and cholesterol at 70 years of age, then have this test every 5 years. You may need to have your cholesterol levels checked more often if:  Your lipid or cholesterol levels are high.  You are older than 70 years of age.  You are at high risk for heart disease. What should I know about cancer screening? Many types of cancers can be detected  early and may often be prevented. Depending on your health history and family history, you may need to have cancer screening at various ages. This may include screening for:  Colorectal cancer.  Prostate cancer.  Skin cancer.  Lung cancer. What should I know about heart disease, diabetes, and high blood pressure? Blood pressure and heart disease  High blood pressure causes heart disease and increases the risk of stroke. This is more likely to develop in people who have high blood pressure readings, are of African descent, or are overweight.  Talk with your health care provider about your target blood pressure readings.  Have your blood pressure checked: ? Every 3-5 years if you are 65-58 years of age. ? Every year if you are 92 years old or older.  If you are between the ages of 51 and 57 and are a current or former smoker, ask your health care provider if you should have a one-time screening for abdominal aortic aneurysm (AAA). Diabetes Have regular diabetes screenings. This checks your fasting blood sugar level. Have the screening done:  Once every three years after age 62 if you are at a normal weight and have a low risk for diabetes.  More often and at a younger age if you are overweight or have a high risk for diabetes. What should I know about preventing infection? Hepatitis B If you have a higher risk for hepatitis B, you should be screened for this virus. Talk with your health care provider to find out if  you are at risk for hepatitis B infection. Hepatitis C Blood testing is recommended for:  Everyone born from [birth year] through 1966.  Anyone with known risk factors for hepatitis C. Sexually transmitted infections (STIs)  You should be screened each year for STIs, including gonorrhea and chlamydia, if: ? You are sexually active and are younger than 70 years of age. ? You are older than 70 years of age and your health care provider tells you that you are at risk for this  type of infection. ? Your sexual activity has changed since you were last screened, and you are at increased risk for chlamydia or gonorrhea. Ask your health care provider if you are at risk.  Ask your health care provider about whether you are at high risk for HIV. Your health care provider may recommend a prescription medicine to help prevent HIV infection. If you choose to take medicine to prevent HIV, you should first get tested for HIV. You should then be tested every 3 months for as long as you are taking the medicine. Follow these instructions at home: Lifestyle  Do not use any products that contain nicotine or tobacco, such as cigarettes, e-cigarettes, and chewing tobacco. If you need help quitting, ask your health care provider.  Do not use street drugs.  Do not share needles.  Ask your health care provider for help if you need support or information about quitting drugs. Alcohol use  Do not drink alcohol if your health care provider tells you not to drink.  If you drink alcohol: ? Limit how much you have to 0-2 drinks a day. ? Be aware of how much alcohol is in your drink. In the [place] one drink equals one 12 oz bottle of beer (355 mL), one 5 oz glass of wine (148 mL), or one 1 oz glass of hard liquor (44 mL). General instructions  Schedule regular health, dental, and eye exams.  Stay current with your vaccines.  Tell your health care provider if: ? You often feel depressed. ? You have ever been abused or do not feel safe at home. Summary  Adopting a healthy lifestyle and getting preventive care are important in promoting health and wellness.  Follow your health care provider's instructions about healthy diet, exercising, and getting tested or screened for diseases.  Follow your health care provider's instructions on monitoring your cholesterol and blood pressure. This information is not intended to replace advice given to you by your health care provider. Make sure  you discuss any questions you have with your health care provider. Document Revised: 02/12/2018 Document Reviewed: 02/12/2018 Elsevier Patient Education  2020 [employer].

## 2020-02-02 NOTE — Progress Notes (Signed)
Subjective:    Patient ID: Charles Tate, male    DOB: Oct 18, 1950, 69 y.o.   MRN: [id number]  HPI He is here for a physical exam.    He has no concerns.  Medications and allergies reviewed with patient and updated if appropriate.  Patient Active Problem List   Diagnosis Date Noted  . Aortic atherosclerosis ([place]) 02/03/2020  . Carotid artery stenosis 01/26/2020  . Branch retinal artery occlusion, right eye 01/19/2020  . Posterior vitreous detachment of both eyes 01/19/2020  . 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 Tear of MCL (medial collateral ligament) of knee, left, initial encounter 08/05/2017  . Gluteal tendinitis of left buttock 08/05/2017  . Left inguinal hernia 07/04/2017  . Allergic rhinitis 12/28/2015  . H/O: rheumatic fever 12/28/2015  . Prediabetes 12/27/2015  . Rising PSA level 02/21/2014  . Hx of colonic polyp 02/17/2014  . 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 Hyperlipidemia 01/07/2009  . Asthma 06/04/2007  . Essential hypertension 09/26/2006    Current Outpatient Medications on File Prior to Visit  Medication Sig Dispense Refill  . azelastine (ASTELIN) 0.1 % nasal spray Place 1 spray into both nostrils 2 (two) times daily. Use in each nostril as directed 30 mL 12  . fluticasone (FLONASE) 50 MCG/ACT nasal spray Place 2 sprays into both nostrils daily. 16 g 6  . Misc Natural Products (SUPER GREENS PO) Take 1 Scoop by mouth daily.     . Multiple Vitamin (MULTIVITAMIN) capsule Take 1 capsule by mouth daily.       No current facility-administered medications on file prior to visit.    Past Medical History:  Diagnosis Date  . Allergy   . Arthritis    left hip   . GERD (gastroesophageal reflux disease)   . Heart murmur    adolescent rheumatic fever - developed murmur   . Hyperlipidemia   . Hypertension   . RAD (reactive airway disease)    with RTIs  . Tubular adenoma     Past Surgical  History:  Procedure Laterality Date  . COLONOSCOPY    . colonoscopy with polypectomy  2013   Dr Ferdinand Lango, [facility]  . COLONOSCOPY WITH PROPOFOL N/A 07/01/2019   Procedure: COLONOSCOPY WITH PROPOFOL;  Surgeon: Rush Landmark Telford Nab., MD;  Location: [facility];  Service: Gastroenterology;  Laterality: N/A;  . ENDOSCOPIC MUCOSAL RESECTION N/A 07/01/2019   Procedure: ENDOSCOPIC MUCOSAL RESECTION;  Surgeon: Rush Landmark Telford Nab., MD;  Location: [facility];  Service: Gastroenterology;  Laterality: N/A;  . HEMOSTASIS CLIP PLACEMENT  07/01/2019   Procedure: HEMOSTASIS CLIP PLACEMENT;  Surgeon: Irving Copas., MD;  Location: [facility];  Service: Gastroenterology;;  . POLYPECTOMY    . POLYPECTOMY  07/01/2019   Procedure: POLYPECTOMY;  Surgeon: Mansouraty, Telford Nab., MD;  Location: [facility];  Service: Gastroenterology;;  . Lia Foyer LIFTING INJECTION  07/01/2019   Procedure: SUBMUCOSAL LIFTING INJECTION;  Surgeon: Irving Copas., MD;  Location: [facility];  Service: Gastroenterology;;  . Lia Foyer TATTOO INJECTION  07/01/2019   Procedure: SUBMUCOSAL TATTOO INJECTION;  Surgeon: Irving Copas., MD;  Location: [facility];  Service: Gastroenterology;;  . Arnetha Courser TOOTH EXTRACTION      Social History   Socioeconomic History  . Marital status: Married    Spouse name: [name]  . Number of children: 2  . Years of education: Not on file  . Highest education level: Not on file  Occupational History  . Occupation: self  employed   Tobacco Use  . Smoking status: Former Research scientist (life sciences)  . Smokeless tobacco: Never Used  . Tobacco comment: intermittent, short term smoker as a teen. Some second hand smoke in 16s & 30s  Vaping Use  . Vaping Use: Never used  Substance and Sexual Activity  . Alcohol use: Yes    Comment:   very rarely  . Drug use: No  . Sexual activity: Not on file  Other Topics Concern  . Not on file  Social History Narrative   Exercise: none regularly     Social Determinants of Health   Financial Resource Strain:   . Difficulty of Paying Living Expenses: Not on file  Food Insecurity:   . Worried About Charity fundraiser in the Last Year: Not on file  . Ran Out of Food in the Last Year: Not on file  Transportation Needs:   . Lack of Transportation (Medical): Not on file  . Lack of Transportation (Non-Medical): Not on file  Physical Activity:   . Days of Exercise per Week: Not on file  . Minutes of Exercise per Session: Not on file  Stress:   . Feeling of Stress : Not on file  Social Connections:   . Frequency of Communication with Friends and Family: Not on file  . Frequency of Social Gatherings with Friends and Family: Not on file  . Attends Religious Services: Not on file  . Active Member of Clubs or Organizations: Not on file  . Attends Archivist Meetings: Not on file  . Marital Status: Not on file    Family History  Problem Relation Age of Onset  . Arthritis Mother   . Hyperlipidemia Mother   . Lupus Mother   . Heart attack Father 27  . Hypertension Sister   . Heart attack Paternal Grandmother 80  . Diabetes Paternal Aunt   . Stroke Maternal Grandfather 70  . Cancer Paternal Grandfather        ? stomach; in 75s  . Colon cancer Paternal Grandfather   . Colon polyps Neg Hx   . Esophageal cancer Neg Hx   . Rectal cancer Neg Hx   . Stomach cancer Neg Hx   . Inflammatory bowel disease Neg Hx   . Liver disease Neg Hx   . Pancreatic cancer Neg Hx     Review of Systems  Constitutional: Negative for fever.  Eyes: Negative for visual disturbance.  Respiratory: Negative for cough, shortness of breath and wheezing.   Cardiovascular: Negative for chest pain, palpitations and leg swelling.  Gastrointestinal: Negative for abdominal pain, blood in stool, constipation, diarrhea and nausea.       No gerd  Genitourinary: Positive for difficulty urinating (sometimes at night). Negative for dysuria and hematuria.   Musculoskeletal: Negative for arthralgias and back pain.       Stiffness in the morning  Skin: Negative for color change and rash.  Neurological: Negative for light-headedness and headaches.  Psychiatric/Behavioral: Negative for dysphoric mood. The patient is not nervous/anxious.        Objective:   Vitals:   02/03/20 0830  BP: 122/78  Pulse: (!) 59  Temp: 98.2 F (36.8 C)  SpO2: 96%   Filed Weights   02/03/20 0830  Weight: 210 lb (95.3 kg)   Body mass index is 26.25 kg/m.  BP Readings from Last 3 Encounters:  02/03/20 122/78  02/02/20 126/70  01/20/20 116/70    Wt Readings from Last 3 Encounters:  02/03/20 210  lb (95.3 kg)  02/02/20 210 lb 9.6 oz (95.5 kg)  01/20/20 208 lb 6.4 oz (94.5 kg)     Physical Exam Constitutional: He appears well-developed and well-nourished. No distress.  HENT:  Head: Normocephalic and atraumatic.  Right Ear: External ear normal.  Left Ear: External ear normal.  Mouth/Throat: Oropharynx is clear and moist.  Normal ear canals and TM b/l  Eyes: Conjunctivae and EOM are normal.  Neck: Neck supple. No tracheal deviation present. No thyromegaly present. No carotid bruit  Cardiovascular: Normal rate, regular rhythm, normal heart sounds and intact distal pulses.  No murmur heard. Pulmonary/Chest: Effort normal and breath sounds normal. No respiratory distress. He has no wheezes. He has no rales.  Abdominal: Soft. He exhibits no distension. There is no tenderness.  Genitourinary: deferred  Musculoskeletal: He exhibits no edema.  Lymphadenopathy:   He has no cervical adenopathy.  Skin: Skin is warm and dry. He is not diaphoretic.  Psychiatric: He has a normal mood and affect. His behavior is normal.         Assessment & Plan:   Physical exam: Screening blood work  ordered Immunizations  Flu vac today, discussed shingrix Colonoscopy   Up to date  Eye exams   Up to date  Exercise   None- active Weight  Good for age Substance  abuse   none Sees [place] derm annually  See Problem List for Assessment and Plan of chronic medical problems.   This visit occurred during the SARS-CoV-2 public health emergency.  Safety protocols were in place, including screening questions prior to the visit, additional usage of staff PPE, and extensive cleaning of exam room while observing appropriate contact time as indicated for disinfecting solutions.

## 2020-02-03 ENCOUNTER — Encounter: Payer: Self-pay | Admitting: Internal Medicine

## 2020-02-03 ENCOUNTER — Ambulatory Visit (INDEPENDENT_AMBULATORY_CARE_PROVIDER_SITE_OTHER): Payer: Medicare HMO | Admitting: Internal Medicine

## 2020-02-03 VITALS — BP 122/78 | HR 59 | Temp 98.2°F | Ht 75.0 in | Wt 210.0 lb

## 2020-02-03 DIAGNOSIS — E782 Mixed hyperlipidemia: Secondary | ICD-10-CM | POA: Diagnosis not present

## 2020-02-03 DIAGNOSIS — Z125 Encounter for screening for malignant neoplasm of prostate: Secondary | ICD-10-CM | POA: Diagnosis not present

## 2020-02-03 DIAGNOSIS — R7303 Prediabetes: Secondary | ICD-10-CM

## 2020-02-03 DIAGNOSIS — I700 Atherosclerosis of aorta: Secondary | ICD-10-CM | POA: Diagnosis not present

## 2020-02-03 DIAGNOSIS — I10 Essential (primary) hypertension: Secondary | ICD-10-CM | POA: Diagnosis not present

## 2020-02-03 DIAGNOSIS — Z23 Encounter for immunization: Secondary | ICD-10-CM | POA: Diagnosis not present

## 2020-02-03 DIAGNOSIS — I6529 Occlusion and stenosis of unspecified carotid artery: Secondary | ICD-10-CM | POA: Diagnosis not present

## 2020-02-03 DIAGNOSIS — Z0000 Encounter for general adult medical examination without abnormal findings: Secondary | ICD-10-CM | POA: Diagnosis not present

## 2020-02-03 LAB — LIPID PANEL
Cholesterol: 137 mg/dL (ref 0–200)
HDL: 32.4 mg/dL — ABNORMAL LOW (ref >39.00)
LDL Cholesterol: 84 mg/dL (ref 0–99)
NonHDL: 104.63
Total CHOL/HDL Ratio: 4
Triglycerides: 105 mg/dL (ref 0.0–149.0)
VLDL: 21 mg/dL (ref 0.0–40.0)

## 2020-02-03 LAB — COMPREHENSIVE METABOLIC PANEL
ALT: 19 U/L (ref 0–53)
AST: 16 U/L (ref 0–37)
Albumin: 4.1 g/dL (ref 3.5–5.2)
Alkaline Phosphatase: 80 U/L (ref 39–117)
BUN: 19 mg/dL (ref 6–23)
CO2: 30 mEq/L (ref 19–32)
Calcium: 9 mg/dL (ref 8.4–10.5)
Chloride: 103 mEq/L (ref 96–112)
Creatinine, Ser: 0.84 mg/dL (ref 0.40–1.50)
GFR: 88.3 mL/min (ref >60.00)
Glucose, Bld: 95 mg/dL (ref 70–99)
Potassium: 3.9 mEq/L (ref 3.5–5.1)
Sodium: 139 mEq/L (ref 135–145)
Total Bilirubin: 0.6 mg/dL (ref 0.2–1.2)
Total Protein: 6.8 g/dL (ref 6.0–8.3)

## 2020-02-03 LAB — CBC WITH DIFFERENTIAL/PLATELET
Basophils Absolute: 0 10*3/uL (ref 0.0–0.1)
Basophils Relative: 0.9 % (ref 0.0–3.0)
Eosinophils Absolute: 0.2 10*3/uL (ref 0.0–0.7)
Eosinophils Relative: 2.9 % (ref 0.0–5.0)
HCT: 43 % (ref 39.0–52.0)
Hemoglobin: 14.7 g/dL (ref 13.0–17.0)
Lymphocytes Relative: 13 % (ref 12.0–46.0)
Lymphs Abs: 0.7 10*3/uL (ref 0.7–4.0)
MCHC: 34.1 g/dL (ref 30.0–36.0)
MCV: 86.8 fl (ref 78.0–100.0)
Monocytes Absolute: 0.5 10*3/uL (ref 0.1–1.0)
Monocytes Relative: 9.4 % (ref 3.0–12.0)
Neutro Abs: 4 10*3/uL (ref 1.4–7.7)
Neutrophils Relative %: 73.8 % (ref 43.0–77.0)
Platelets: 200 10*3/uL (ref 150.0–400.0)
RBC: 4.95 Mil/uL (ref 4.22–5.81)
RDW: 13 % (ref 11.5–15.5)
WBC: 5.5 10*3/uL (ref 4.0–10.5)

## 2020-02-03 LAB — HEMOGLOBIN A1C: Hgb A1c MFr Bld: 5.9 % (ref 4.6–6.5)

## 2020-02-03 LAB — TSH: TSH: 1.71 u[IU]/mL (ref 0.35–4.50)

## 2020-02-03 LAB — PSA: PSA: 2.87 ng/mL (ref 0.10–4.00)

## 2020-02-03 MED ORDER — ATORVASTATIN CALCIUM 40 MG PO TABS
40.0000 mg | ORAL_TABLET | Freq: Every day | ORAL | 3 refills | Status: DC
Start: 2020-02-03 — End: 2021-01-29

## 2020-02-03 MED ORDER — TELMISARTAN-HCTZ 80-25 MG PO TABS: ORAL_TABLET | ORAL | 3 refills | Status: DC

## 2020-02-03 MED ORDER — ASPIRIN EC 325 MG PO TBEC: 325.0000 mg | DELAYED_RELEASE_TABLET | Freq: Every day | ORAL | 0 refills | Status: DC

## 2020-02-03 NOTE — Addendum Note (Signed)
Addended by: Marcina Millard on: 02/03/2020 03:17 PM   Modules accepted: Orders

## 2020-02-03 NOTE — Assessment & Plan Note (Signed)
Chronic Check lipid panel CMP, TSH Continue atorvastatin 40 mg daily Regular exercise and healthy diet encouraged

## 2020-02-03 NOTE — Assessment & Plan Note (Signed)
Chronic Taking atorvastatin 40 mg daily - increased two weeks ago Will work on diet and will try to exercise more Check lipids today, but this is only with 2 weeks of the increased statin dose

## 2020-02-03 NOTE — Assessment & Plan Note (Addendum)
Chronic On atorvastatin 40 mg-this was increased only 2 weeks ago LDL goal < 70 - check lipids today Will make dietary changes and increase exercise

## 2020-02-03 NOTE — Assessment & Plan Note (Signed)
Chronic Check a1c Low sugar / carb diet Stressed regular exercise  

## 2020-02-03 NOTE — Assessment & Plan Note (Signed)
Chronic BP well controlled Continue micardis hct 80-25 mg daily cmp

## 2020-02-04 ENCOUNTER — Ambulatory Visit: Payer: Medicare HMO | Admitting: Vascular Surgery

## 2020-02-04 ENCOUNTER — Telehealth: Payer: Self-pay | Admitting: Vascular Surgery

## 2020-02-04 ENCOUNTER — Ambulatory Visit (INDEPENDENT_AMBULATORY_CARE_PROVIDER_SITE_OTHER): Payer: Medicare HMO | Admitting: Ophthalmology

## 2020-02-04 ENCOUNTER — Telehealth: Payer: Self-pay

## 2020-02-04 ENCOUNTER — Encounter (INDEPENDENT_AMBULATORY_CARE_PROVIDER_SITE_OTHER): Payer: Self-pay | Admitting: Ophthalmology

## 2020-02-04 ENCOUNTER — Encounter: Payer: Self-pay | Admitting: Vascular Surgery

## 2020-02-04 ENCOUNTER — Other Ambulatory Visit: Payer: Self-pay

## 2020-02-04 VITALS — BP 112/71 | HR 66 | Temp 98.7°F | Resp 20 | Ht 75.0 in | Wt 208.0 lb

## 2020-02-04 DIAGNOSIS — I6521 Occlusion and stenosis of right carotid artery: Secondary | ICD-10-CM | POA: Diagnosis not present

## 2020-02-04 DIAGNOSIS — H34231 Retinal artery branch occlusion, right eye: Secondary | ICD-10-CM

## 2020-02-04 NOTE — Patient Instructions (Signed)
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

## 2020-02-04 NOTE — Assessment & Plan Note (Signed)
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  Separate refractile deposit in the retinal small vessels inferotemporal portion of the macula this is clearly Hollenhorst plaque, calcific.  These findings of calcific Hollenhorst plaques are new after therapeutic intervention 2 weeks ago of aqueous paracentesis to soften the globe to allow for distal displacement of cilioretinal artery occlusion.  Clearly these are now refractile, reflective and calcific in nature  These findings today should be taken account in my opinion when the patient is scheduled to see the vascular surgeon in the near future due to the reported carotid artery stenosis ipsilateral to this lesion

## 2020-02-04 NOTE — Telephone Encounter (Signed)
Discussed case with Dr. Zadie Rhine.  Patient has had two embolic events to retinal artery in last several weeks with ipsilateral carotid stenosis. Will work in to clinic today with Dr. Oneida Alar.  Charles Tate. Stanford Breed, MD [facility] Phone Number: [phone] 02/04/2020 12:37 PM

## 2020-02-04 NOTE — Progress Notes (Signed)
Referring Physician: Dr. Zadie Rhine  Patient name: Charles Tate MRN: [id number] DOB: [birth year] Sex: male  REASON FOR CONSULT: Right retinal artery embolus  HPI: Charles Tate is a 69 y.o. male, who several weeks ago noticed a gray cloudy area in his right eye.  He was seen by Dr. Zadie Rhine and found to have embolic material in his retinal artery.  An injection was performed and his vision did return to normal.  He has not had any further events.  He has no symptoms of TIA amaurosis or stroke.  Prior to this he had been on a low-dose statin.  He had not been on aspirin.  His statin has now been increased from 20 mg to 40 mg of Lipitor daily.  His aspirin is currently at 325 mg/day.  He had an echocardiogram performed on November 18th and was evaluated by Dr. Tamala Julian from cardiology.  No further intervention was necessary.  Other medical problems include hyperlipidemia hypertension both of which have been stable.  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  He also sings and plays guitar professionally.  Past Medical History:  Diagnosis Date  . Allergy   . Arthritis    left hip   . GERD (gastroesophageal reflux disease)   . Heart murmur    adolescent rheumatic fever - developed murmur   . Hyperlipidemia   . Hypertension   . RAD (reactive airway disease)    with RTIs  . Tubular adenoma    Past Surgical History:  Procedure Laterality Date  . COLONOSCOPY    . colonoscopy with polypectomy  2013   Dr Ferdinand Lango, [facility]  . COLONOSCOPY WITH PROPOFOL N/A 07/01/2019   Procedure: COLONOSCOPY WITH PROPOFOL;  Surgeon: Rush Landmark Telford Nab., MD;  Location: [facility];  Service: Gastroenterology;  Laterality: N/A;  . ENDOSCOPIC MUCOSAL RESECTION N/A 07/01/2019   Procedure: ENDOSCOPIC MUCOSAL RESECTION;  Surgeon: Rush Landmark Telford Nab., MD;  Location: [facility];  Service: Gastroenterology;  Laterality: N/A;  . HEMOSTASIS CLIP PLACEMENT  07/01/2019   Procedure: HEMOSTASIS CLIP  PLACEMENT;  Surgeon: Irving Copas., MD;  Location: [facility];  Service: Gastroenterology;;  . POLYPECTOMY    . POLYPECTOMY  07/01/2019   Procedure: POLYPECTOMY;  Surgeon: Mansouraty, Telford Nab., MD;  Location: [facility];  Service: Gastroenterology;;  . Lia Foyer LIFTING INJECTION  07/01/2019   Procedure: SUBMUCOSAL LIFTING INJECTION;  Surgeon: Irving Copas., MD;  Location: [facility];  Service: Gastroenterology;;  . Lia Foyer TATTOO INJECTION  07/01/2019   Procedure: SUBMUCOSAL TATTOO INJECTION;  Surgeon: Irving Copas., MD;  Location: [facility];  Service: Gastroenterology;;  . Arnetha Courser TOOTH EXTRACTION      Family History  Problem Relation Age of Onset  . Arthritis Mother   . Hyperlipidemia Mother   . Lupus Mother   . Heart attack Father 69  . Hypertension Sister   . Heart attack Paternal Grandmother 54  . Diabetes Paternal Aunt   . Stroke Maternal Grandfather 18  . Cancer Paternal Grandfather        ? stomach; in 30s  . Colon cancer Paternal Grandfather   . Colon polyps Neg Hx   . Esophageal cancer Neg Hx   . Rectal cancer Neg Hx   . Stomach cancer Neg Hx   . Inflammatory bowel disease Neg Hx   . Liver disease Neg Hx   . Pancreatic cancer Neg Hx     SOCIAL HISTORY: Social History   Socioeconomic History  . Marital status: Married  Spouse name: Charles Tate  . Number of children: 2  . Years of education: Not on file  . Highest education level: Not on file  Occupational History  . Occupation: self employed   Tobacco Use  . Smoking status: Former Research scientist (life sciences)  . Smokeless tobacco: Never Used  . Tobacco comment: intermittent, short term smoker as a teen. Some second hand smoke in 54s & 30s  Vaping Use  . Vaping Use: Never used  Substance and Sexual Activity  . Alcohol use: Yes    Comment:   very rarely  . Drug use: No  . Sexual activity: Not on file  Other Topics Concern  . Not on file  Social History Narrative   Exercise: none  regularly   Social Determinants of Health   Financial Resource Strain:   . Difficulty of Paying Living Expenses: Not on file  Food Insecurity:   . Worried About Charity fundraiser in the Last Year: Not on file  . Ran Out of Food in the Last Year: Not on file  Transportation Needs:   . Lack of Transportation (Medical): Not on file  . Lack of Transportation (Non-Medical): Not on file  Physical Activity:   . Days of Exercise per Week: Not on file  . Minutes of Exercise per Session: Not on file  Stress:   . Feeling of Stress : Not on file  Social Connections:   . Frequency of Communication with Friends and Family: Not on file  . Frequency of Social Gatherings with Friends and Family: Not on file  . Attends Religious Services: Not on file  . Active Member of Clubs or Organizations: Not on file  . Attends Archivist Meetings: Not on file  . Marital Status: Not on file  Intimate Partner Violence:   . Fear of Current or Ex-Partner: Not on file  . Emotionally Abused: Not on file  . Physically Abused: Not on file  . Sexually Abused: Not on file    Allergies  Allergen Reactions  . Ciprofloxacin Hcl     Numbness, joint pain  . Cefdinir Other (See Comments)    Fever, chills, stomach cramps and diarrhea  . Lisinopril Cough    Current Outpatient Medications  Medication Sig Dispense Refill  . aspirin EC 325 MG tablet Take 1 tablet (325 mg total) by mouth daily. 30 tablet 0  . atorvastatin (LIPITOR) 40 MG tablet Take 1 tablet (40 mg total) by mouth daily. 90 tablet 3  . azelastine (ASTELIN) 0.1 % nasal spray Place 1 spray into both nostrils 2 (two) times daily. Use in each nostril as directed 30 mL 12  . fluticasone (FLONASE) 50 MCG/ACT nasal spray Place 2 sprays into both nostrils daily. 16 g 6  . Misc Natural Products (SUPER GREENS PO) Take 1 Scoop by mouth daily.     . Multiple Vitamin (MULTIVITAMIN) capsule Take 1 capsule by mouth daily.      Marland Kitchen  telmisartan-hydrochlorothiazide (MICARDIS HCT) 80-25 MG tablet TAKE 1 TABLET ONCE DAILY 90 tablet 3   No current facility-administered medications for this visit.    ROS:   General:  No weight loss, Fever, chills  HEENT: No recent headaches, no nasal bleeding, no visual changes, no sore throat  Neurologic: No dizziness, blackouts, seizures. No recent symptoms of stroke or mini- stroke. No recent episodes of slurred speech, or temporary blindness.  Cardiac: No recent episodes of chest pain/pressure, no shortness of breath at rest.  No shortness of breath with exertion.  Denies history of atrial fibrillation or irregular heartbeat  Vascular: No history of rest pain in feet.  No history of claudication.  No history of non-healing ulcer, No history of DVT   Pulmonary: No home oxygen, no productive cough, no hemoptysis,  No asthma or wheezing  Musculoskeletal:  [ ]  Arthritis, [ ]  Low back pain,  [ ]  Joint pain  Hematologic:No history of hypercoagulable state.  No history of easy bleeding.  No history of anemia  Gastrointestinal: No hematochezia or melena,  No gastroesophageal reflux, no trouble swallowing  Urinary: [ ]  chronic Kidney disease, [ ]  on HD - [ ]  MWF or [ ]  TTHS, [ ]  Burning with urination, [ ]  Frequent urination, [ ]  Difficulty urinating;   Skin: No rashes  Psychological: No history of anxiety,  No history of depression   Physical Examination  Vitals:   02/04/20 1335 02/04/20 1337  BP: 110/73 112/71  Pulse: 66   Resp: 20   Temp: 98.7 F (37.1 C)   SpO2: 96%   Weight: 208 lb (94.3 kg)   Height: 6\' 3"  (1.905 m)     Body mass index is 26 kg/m.  General:  Alert and oriented, no acute distress HEENT: Normal Neck: No bruit or JVD Pulmonary: Clear to auscultation bilaterally Cardiac: Regular Rate and Rhythm without murmur Abdomen: Soft, non-tender, non-distended, no mass, no scars Skin: No rash Extremity Pulses:  2+ radial, brachial, femoral, dorsalis pedis,  posterior tibial pulses bilaterally Musculoskeletal: No deformity or edema  Neurologic: Upper and lower extremity motor 5/5 and symmetric  DATA:  Patient had bilateral carotid duplex performed on 01/25/2020 at [facility].  This showed 40 to 60% right internal carotid artery stenosis with velocities of 183/29.  Plaque was heterogeneous in appearance.  Left carotid was less than 40%.  I reviewed and interpreted the study.  He also had an echocardiogram performed November 18 which was essentially normal.    ASSESSMENT: Moderate right internal carotid artery stenosis possible source of right retinal embolus.  I had a lengthy discussion with the patient today regarding this plaque as a possible nidus of future embolic events.  We discussed that there is some benefit of doing carotid endarterectomy if the lesion is 50% or greater.  I discussed with him the possibility of doing a right carotid endarterectomy.  We discussed risk benefits possible complications and procedure details.  These include but are not limited to bleeding infection stroke risk of 1% cranial nerve injury risk of 10 to 15% usually transient.  He had some concerns regarding this because of his singing voice.  In light of this we are going to obtain a CT angiogram of the head and neck to further clarify the level of stenosis on the right side.  If this truly is 50% or greater we will need to consider right carotid endarterectomy.  He would not meet high risk criteria for TCAR stenting so this most likely would not be an option.  If the lesion is less than 50% we will continue medical management with aspirin and statin.  He had not been on aspirin prior to this event.   PLAN: CT angiogram head and neck can follow-up with me next week.   Ruta Hinds, MD [facility] [place] Office: [phone]

## 2020-02-04 NOTE — Telephone Encounter (Signed)
Per stf message and MD phone call - patient needs to be evaluated today for possible CEA. Put patient on MD schedule today - patient aware.

## 2020-02-04 NOTE — Progress Notes (Addendum)
02/04/2020     CHIEF COMPLAINT Patient presents for Retina Follow Up   HISTORY OF PRESENT ILLNESS: Charles Tate is a 69 y.o. male who presents to the clinic today for:   HPI    Retina Follow Up    Patient presents with  Other.  In right eye.  This started 2 weeks ago.  Severity is mild.  Duration of 2 weeks.  Since onset it is gradually improving.          Comments    2 Week F/U OD  Pt reports clearing VA OD. No new symptoms reported. VA stable OS.       Last edited by Rockie Neighbours, [place] on 02/04/2020  8:35 AM. (History)      Referring physician: [name], MD [place],  [place] [id number]  HISTORICAL INFORMATION:   Selected notes from the MEDICAL RECORD NUMBER    Lab Results  Component Value Date   HGBA1C 5.9 02/03/2020     CURRENT MEDICATIONS: No current outpatient medications on file. (Ophthalmic Drugs)   No current facility-administered medications for this visit. (Ophthalmic Drugs)   Current Outpatient Medications (Other)  Medication Sig  . aspirin EC 325 MG tablet Take 1 tablet (325 mg total) by mouth daily.  Marland Kitchen atorvastatin (LIPITOR) 40 MG tablet Take 1 tablet (40 mg total) by mouth daily.  Marland Kitchen azelastine (ASTELIN) 0.1 % nasal spray Place 1 spray into both nostrils 2 (two) times daily. Use in each nostril as directed  . fluticasone (FLONASE) 50 MCG/ACT nasal spray Place 2 sprays into both nostrils daily.  . Misc Natural Products (SUPER GREENS PO) Take 1 Scoop by mouth daily.   . Multiple Vitamin (MULTIVITAMIN) capsule Take 1 capsule by mouth daily.    Marland Kitchen telmisartan-hydrochlorothiazide (MICARDIS HCT) 80-25 MG tablet TAKE 1 TABLET ONCE DAILY   No current facility-administered medications for this visit. (Other)      REVIEW OF SYSTEMS:    ALLERGIES Allergies  Allergen Reactions  . Ciprofloxacin Hcl     Numbness, joint pain  . Cefdinir Other (See Comments)    Fever, chills, stomach cramps and diarrhea  . Lisinopril Cough     PAST MEDICAL HISTORY Past Medical History:  Diagnosis Date  . Allergy   . Arthritis    left hip   . GERD (gastroesophageal reflux disease)   . Heart murmur    adolescent rheumatic fever - developed murmur   . Hyperlipidemia   . Hypertension   . RAD (reactive airway disease)    with RTIs  . Tubular adenoma    Past Surgical History:  Procedure Laterality Date  . COLONOSCOPY    . colonoscopy with polypectomy  2013   Dr Ferdinand Lango, [facility]  . COLONOSCOPY WITH PROPOFOL N/A 07/01/2019   Procedure: COLONOSCOPY WITH PROPOFOL;  Surgeon: Rush Landmark Telford Nab., MD;  Location: [facility];  Service: Gastroenterology;  Laterality: N/A;  . ENDOSCOPIC MUCOSAL RESECTION N/A 07/01/2019   Procedure: ENDOSCOPIC MUCOSAL RESECTION;  Surgeon: Rush Landmark Telford Nab., MD;  Location: [facility];  Service: Gastroenterology;  Laterality: N/A;  . HEMOSTASIS CLIP PLACEMENT  07/01/2019   Procedure: HEMOSTASIS CLIP PLACEMENT;  Surgeon: Irving Copas., MD;  Location: [facility];  Service: Gastroenterology;;  . POLYPECTOMY    . POLYPECTOMY  07/01/2019   Procedure: POLYPECTOMY;  Surgeon: Mansouraty, Telford Nab., MD;  Location: [facility];  Service: Gastroenterology;;  . Lia Foyer LIFTING INJECTION  07/01/2019   Procedure: SUBMUCOSAL LIFTING INJECTION;  Surgeon: Rush Landmark,  Telford Nab., MD;  Location: [facility];  Service: Gastroenterology;;  . Lia Foyer TATTOO INJECTION  07/01/2019   Procedure: SUBMUCOSAL TATTOO INJECTION;  Surgeon: Irving Copas., MD;  Location: [facility];  Service: Gastroenterology;;  . Arnetha Courser TOOTH EXTRACTION      FAMILY HISTORY Family History  Problem Relation Age of Onset  . Arthritis Mother   . Hyperlipidemia Mother   . Lupus Mother   . Heart attack Father 16  . Hypertension Sister   . Heart attack Paternal Grandmother 73  . Diabetes Paternal Aunt   . Stroke Maternal Grandfather 36  . Cancer Paternal Grandfather        ? stomach; in 15s  . Colon  cancer Paternal Grandfather   . Colon polyps Neg Hx   . Esophageal cancer Neg Hx   . Rectal cancer Neg Hx   . Stomach cancer Neg Hx   . Inflammatory bowel disease Neg Hx   . Liver disease Neg Hx   . Pancreatic cancer Neg Hx     SOCIAL HISTORY Social History   Tobacco Use  . Smoking status: Former Research scientist (life sciences)  . Smokeless tobacco: Never Used  . Tobacco comment: intermittent, short term smoker as a teen. Some second hand smoke in 29s & 30s  Vaping Use  . Vaping Use: Never used  Substance Use Topics  . Alcohol use: Yes    Comment:   very rarely  . Drug use: No         OPHTHALMIC EXAM:  Base Eye Exam    Visual Acuity (ETDRS)      Right Left   Dist cc 20/20 -1 20/20 -1   Correction: Glasses       Tonometry (Tonopen, 8:35 AM)      Right Left   Pressure 19 20       Pupils      Pupils Dark Light Shape React APD   Right PERRL 5 4 Round Brisk None   Left PERRL 5 4 Round Brisk None       Visual Fields (Counting fingers)      Left Right    Full Full       Extraocular Movement      Right Left    Full Full       Neuro/Psych    Oriented x3: Yes   Mood/Affect: Normal       Dilation    Right eye: 1.0% Mydriacyl, 2.5% Phenylephrine @ 8:38 AM        Slit Lamp and Fundus Exam    External Exam      Right Left   External Normal Normal       Slit Lamp Exam      Right Left   Lids/Lashes Normal Normal   Conjunctiva/Sclera White and quiet White and quiet   Cornea Clear Clear   Anterior Chamber Deep and quiet Deep and quiet   Iris Round and reactive Round and reactive   Lens 2+ Nuclear sclerosis 2+ Nuclear sclerosis   Anterior Vitreous Normal Normal       Fundus Exam      Right Left   Posterior Vitreous Posterior vitreous detachment    Disc Normal    C/D Ratio 0.2    Macula Intraretinal whitening nerve fiber layer not confluent but centered on cilioretinal vessel on the inferior aspect of the macula and nasal to the fovea,,, all the previous comments in this  section were seen 2 weeks ago, these of all faded and  there is no inner retinal whitening suggestive of inner retinal ischemiaHowever there are refractile calcific deposits in the cilioretinal artery on the nasal aspect of the fovea.  Separate refractile deposit in the retinal small vessels inferotemporal portion of the macula this is clearly Hollenhorst plaque, calcific    Vessels  No signs of complete cilio retinal vessel occlusion ,  there are refractile calcific deposits in the cilioretinal artery on the nasal aspect of the fovea.  Separate refractile deposit in the retinal small vessels inferotemporal portion of the macula this is clearly Hollenhorst plaque, calcific.  These findings of calcific Hollenhorst plaques are new after therapeutic intervention 2 weeks ago of aqueous paracentesis to soften the globe to allow for distal displacement of cilioretinal artery occlusion.  Clearly these are now refractile, reflective and calcific in nature    Periphery Normal           IMAGING AND PROCEDURES  Imaging and Procedures for 02/04/20  Color Fundus Photography Optos - OU - Both Eyes       Decreased retinal whitening in the region of the cilioretinal artery papillomacular bundle right eye, the calcific Hollenhorst plaques that I see clearly clinically with projected white light apparently do not display on laser produced images of the retina demonstrably .         OCT, Retina - OU - Both Eyes       Right Eye Quality was good. Scan locations included subfoveal. Central Foveal Thickness: 299. Progression has improved. Findings include abnormal foveal contour.   Left Eye Quality was good. Scan locations included subfoveal. Central Foveal Thickness: 298. Progression has been stable. Findings include normal foveal contour.   Notes Injury of the inner retinal layers continues yet with preservation of some of the retinal architecture nasal to the fovea OD OD.  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, now seen today clinically as refractile material in nature                ASSESSMENT/PLAN:  Branch retinal artery occlusion, right eye Patient reports that 1 day after her visit here in middle November 2021 and post aqueous tap to lower the intraocular pressure in an attempt to allow intravascular occlusion to dislodge distally, the patient reported gray vision going to white and then the next day within 24 to 36 hours to complete return to normal visual acuity by his perception in the right eye  Todays clinical findings are as below  No signs of complete cilio retinal vessel occlusion ,  there are refractile calcific deposits in the cilioretinal artery on the nasal aspect of the fovea.  Separate refractile deposit in the retinal small vessels inferotemporal portion of the macula this is clearly Hollenhorst plaque, calcific.  These findings of calcific Hollenhorst plaques are new after therapeutic intervention 2 weeks ago of aqueous paracentesis to soften the globe to allow for distal displacement of cilioretinal artery occlusion.  Clearly these are now refractile, reflective and calcific in nature  These findings today should be taken account in my opinion when the patient is scheduled to see the vascular surgeon in the near future due to the reported carotid artery stenosis ipsilateral to this lesion      ICD-10-CM   1. Branch retinal artery occlusion, right eye  H34.231 Color Fundus Photography Optos - OU - Both Eyes    OCT, Retina - OU - Both Eyes    1.  Branch Retinal artery  occlusion of the right eye, with visual acuity improvement and symptoms have improved based upon clinical findings today, as compared to examination 2 weeks previous.  This would suggest patient needs rather urgent vascular surgery consultation because of the demonstrable clinical finding on my examination today of calcific refractile plaques  in the distribution of the small cilioretinal artery in and near the macula right eye that was occluded 2 weeks previous with visual symptoms on his previous visit dated 01/19/2020  Symptoms have improved since that time only because therapeutic measures were undertaken on that day and displacement distally of the vascular occlusion occurred for the first time, the actual occlusive Hollenhorst plaques, calcific and refractile, are now visible for the first time as refractile calcific deposits in and around the macula of the right eye  2.  Patient also reports having other vague neurologic discomfort and perhaps aching pain in his head which I described to him as potentially a shower of particles as it is unlikely that this 1 calcific deposit truck only the eye in this case or in other cases  3.  My office contacted [facility] vascular surgery, I spoke with Dr. Stanford Breed, who agreed with the carotid Doppler findings and with end organ damage, the right eye, consideration for carotid endarterectomy should be undertaken in the near future.  I provided Dr. Luan Pulling the patient's MRN number and he indicated he or his office will reach out with him today to be seen today by he or one of his partners  In the meantime patient should continue on aspirin  Prior to his departure earlier, the patient understood the critical importance of follow-up with vascular surgery within the next 24 to 36 hours.  He expressed his gratitude for my sharing my sense of urgency  Ophthalmic Meds Ordered this visit:  No orders of the defined types were placed in this encounter.      Return in about 3 months (around 05/04/2020) for DILATE OU, COLOR FP, OCT.  Patient Instructions  I instructed the patient to urge follow-up with vascular surgeon in the very near future, for consideration of a calcific embolic plaque to the right eye which she has had the good fortune to have an improvement in some recovery of vision.  Most  likely recovery of vision is on the basis of the transient hypotony induced therapeutically and successfully moving this occlusion of cilioretinal artery occlusion downstream such that he is able to recover much central vision and improved symptomatology right eye  Specifically he had a symptomatic reversible ischemic deficit which we can on clinical exam clearly see calcific refractile embolic material, not simply platelet fibrin or cholesterol    Explained the diagnoses, plan, and follow up with the patient and they expressed understanding.  Patient expressed understanding of the importance of proper follow up care.   Clent Demark Roston Grunewald M.D. Diseases & Surgery of the Retina and Vitreous [facility] [place] 02/04/20     Abbreviations: M myopia (nearsighted); A astigmatism; H hyperopia (farsighted); P presbyopia; Mrx spectacle prescription;  CTL contact lenses; OD right eye; OS left eye; OU both eyes  XT exotropia; ET esotropia; PEK punctate epithelial keratitis; PEE punctate epithelial erosions; DES dry eye syndrome; MGD meibomian gland dysfunction; ATs artificial tears; PFAT's preservative free artificial tears; Bent nuclear sclerotic cataract; PSC posterior subcapsular cataract; ERM epi-retinal membrane; PVD posterior vitreous detachment; RD retinal detachment; DM diabetes mellitus; DR diabetic retinopathy; NPDR non-proliferative diabetic retinopathy; PDR proliferative diabetic retinopathy; CSME clinically significant  macular edema; DME diabetic macular edema; dbh dot blot hemorrhages; CWS cotton wool spot; POAG primary open angle glaucoma; C/D cup-to-disc ratio; HVF humphrey visual field; GVF goldmann visual field; OCT optical coherence tomography; IOP intraocular pressure; BRVO Branch retinal vein occlusion; CRVO central retinal vein occlusion; CRAO central retinal artery occlusion; BRAO branch retinal artery occlusion; RT retinal tear; SB scleral buckle; PPV pars plana vitrectomy; VH  Vitreous hemorrhage; PRP panretinal laser photocoagulation; IVK intravitreal kenalog; VMT vitreomacular traction; MH Macular hole;  NVD neovascularization of the disc; NVE neovascularization elsewhere; AREDS age related eye disease study; ARMD age related macular degeneration; POAG primary open angle glaucoma; EBMD epithelial/anterior basement membrane dystrophy; ACIOL anterior chamber intraocular lens; IOL intraocular lens; PCIOL posterior chamber intraocular lens; Phaco/IOL phacoemulsification with intraocular lens placement; [place]ley photorefractive keratectomy; LASIK laser assisted in situ keratomileusis; HTN hypertension; DM diabetes mellitus; COPD chronic obstructive pulmonary disease

## 2020-02-05 ENCOUNTER — Other Ambulatory Visit: Payer: Self-pay

## 2020-02-05 DIAGNOSIS — I6521 Occlusion and stenosis of right carotid artery: Secondary | ICD-10-CM

## 2020-02-07 ENCOUNTER — Ambulatory Visit (INDEPENDENT_AMBULATORY_CARE_PROVIDER_SITE_OTHER): Payer: Medicare HMO

## 2020-02-07 DIAGNOSIS — I639 Cerebral infarction, unspecified: Secondary | ICD-10-CM

## 2020-02-07 DIAGNOSIS — I4891 Unspecified atrial fibrillation: Secondary | ICD-10-CM | POA: Diagnosis not present

## 2020-02-08 ENCOUNTER — Other Ambulatory Visit: Payer: Self-pay

## 2020-02-08 ENCOUNTER — Ambulatory Visit (HOSPITAL_COMMUNITY)
Admission: RE | Admit: 2020-02-08 | Discharge: 2020-02-08 | Disposition: A | Discharge status: Home / Self Care | Payer: Medicare HMO | Source: Ambulatory Visit | Attending: Vascular Surgery | Admitting: Vascular Surgery

## 2020-02-08 DIAGNOSIS — I6521 Occlusion and stenosis of right carotid artery: Secondary | ICD-10-CM | POA: Insufficient documentation

## 2020-02-08 DIAGNOSIS — I6503 Occlusion and stenosis of bilateral vertebral arteries: Secondary | ICD-10-CM | POA: Diagnosis not present

## 2020-02-08 DIAGNOSIS — I672 Cerebral atherosclerosis: Secondary | ICD-10-CM | POA: Diagnosis not present

## 2020-02-08 MED ORDER — IOHEXOL 350 MG/ML SOLN
75.0000 mL | Freq: Once | INTRAVENOUS | Status: AC | PRN
  Administered 2020-02-08: 75 mL via INTRAVENOUS

## 2020-02-11 ENCOUNTER — Ambulatory Visit (INDEPENDENT_AMBULATORY_CARE_PROVIDER_SITE_OTHER): Payer: Medicare HMO | Admitting: Vascular Surgery

## 2020-02-11 ENCOUNTER — Encounter: Payer: Self-pay | Admitting: Vascular Surgery

## 2020-02-11 ENCOUNTER — Ambulatory Visit: Payer: Medicare HMO | Admitting: Gastroenterology

## 2020-02-11 ENCOUNTER — Other Ambulatory Visit: Payer: Self-pay

## 2020-02-11 VITALS — BP 126/78 | HR 64 | Temp 98.3°F | Resp 20 | Ht 75.0 in | Wt 208.0 lb

## 2020-02-11 DIAGNOSIS — I6521 Occlusion and stenosis of right carotid artery: Secondary | ICD-10-CM | POA: Diagnosis not present

## 2020-02-11 NOTE — Progress Notes (Signed)
Patient name: Charles Tate MRN: [id number] DOB: 1951/02/25 Sex: male  HPI: ZAK GONDEK is a 69 y.o. male, who returns for follow-up today regarding right carotid stenosis.  He recently had an area of embolization to his right retinal artery.  He returns today after recent CT angiogram.  Previous duplex exam showed 60 to 80% stenosis right internal carotid artery.  He is currently on aspirin and a statin.  He has not had any new neurologic events since his last office visit.    Past Medical History:  Diagnosis Date  . Allergy   . Arthritis    left hip   . GERD (gastroesophageal reflux disease)   . Heart murmur    adolescent rheumatic fever - developed murmur   . Hyperlipidemia   . Hypertension   . RAD (reactive airway disease)    with RTIs  . Tubular adenoma    Past Surgical History:  Procedure Laterality Date  . COLONOSCOPY    . colonoscopy with polypectomy  2013   Dr Ferdinand Lango, [facility]  . COLONOSCOPY WITH PROPOFOL N/A 07/01/2019   Procedure: COLONOSCOPY WITH PROPOFOL;  Surgeon: Rush Landmark Telford Nab., MD;  Location: [facility];  Service: Gastroenterology;  Laterality: N/A;  . ENDOSCOPIC MUCOSAL RESECTION N/A 07/01/2019   Procedure: ENDOSCOPIC MUCOSAL RESECTION;  Surgeon: Rush Landmark Telford Nab., MD;  Location: [facility];  Service: Gastroenterology;  Laterality: N/A;  . HEMOSTASIS CLIP PLACEMENT  07/01/2019   Procedure: HEMOSTASIS CLIP PLACEMENT;  Surgeon: Irving Copas., MD;  Location: [facility];  Service: Gastroenterology;;  . POLYPECTOMY    . POLYPECTOMY  07/01/2019   Procedure: POLYPECTOMY;  Surgeon: Mansouraty, Telford Nab., MD;  Location: [facility];  Service: Gastroenterology;;  . Lia Foyer LIFTING INJECTION  07/01/2019   Procedure: SUBMUCOSAL LIFTING INJECTION;  Surgeon: Irving Copas., MD;  Location: [facility];  Service: Gastroenterology;;  . Lia Foyer TATTOO INJECTION  07/01/2019   Procedure: SUBMUCOSAL TATTOO INJECTION;  Surgeon:  Irving Copas., MD;  Location: [facility];  Service: Gastroenterology;;  . Arnetha Courser TOOTH EXTRACTION      Family History  Problem Relation Age of Onset  . Arthritis Mother   . Hyperlipidemia Mother   . Lupus Mother   . Heart attack Father 73  . Hypertension Sister   . Heart attack Paternal Grandmother 54  . Diabetes Paternal Aunt   . Stroke Maternal Grandfather 43  . Cancer Paternal Grandfather        ? stomach; in 37s  . Colon cancer Paternal Grandfather   . Colon polyps Neg Hx   . Esophageal cancer Neg Hx   . Rectal cancer Neg Hx   . Stomach cancer Neg Hx   . Inflammatory bowel disease Neg Hx   . Liver disease Neg Hx   . Pancreatic cancer Neg Hx     SOCIAL HISTORY: Social History   Socioeconomic History  . Marital status: Married    Spouse name: [name]  . Number of children: 2  . Years of education: Not on file  . Highest education level: Not on file  Occupational History  . Occupation: self employed   Tobacco Use  . Smoking status: Former Research scientist (life sciences)  . Smokeless tobacco: Never Used  . Tobacco comment: intermittent, short term smoker as a teen. Some second hand smoke in 48s & 30s  Vaping Use  . Vaping Use: Never used  Substance and Sexual Activity  . Alcohol use: Yes    Comment:   very rarely  . Drug use:  No  . Sexual activity: Not on file  Other Topics Concern  . Not on file  Social History Narrative   Exercise: none regularly   Social Determinants of Health   Financial Resource Strain: Not on file  Food Insecurity: Not on file  Transportation Needs: Not on file  Physical Activity: Not on file  Stress: Not on file  Social Connections: Not on file  Intimate Partner Violence: Not on file    Allergies  Allergen Reactions  . Ciprofloxacin Hcl     Numbness, joint pain  . Cefdinir Other (See Comments)    Fever, chills, stomach cramps and diarrhea  . Lisinopril Cough    Current Outpatient Medications  Medication Sig Dispense Refill  .  aspirin EC 325 MG tablet Take 1 tablet (325 mg total) by mouth daily. 30 tablet 0  . atorvastatin (LIPITOR) 40 MG tablet Take 1 tablet (40 mg total) by mouth daily. 90 tablet 3  . azelastine (ASTELIN) 0.1 % nasal spray Place 1 spray into both nostrils 2 (two) times daily. Use in each nostril as directed 30 mL 12  . fluticasone (FLONASE) 50 MCG/ACT nasal spray Place 2 sprays into both nostrils daily. 16 g 6  . Misc Natural Products (SUPER GREENS PO) Take 1 Scoop by mouth daily.     . Multiple Vitamin (MULTIVITAMIN) capsule Take 1 capsule by mouth daily.    Marland Kitchen telmisartan-hydrochlorothiazide (MICARDIS HCT) 80-25 MG tablet TAKE 1 TABLET ONCE DAILY 90 tablet 3   No current facility-administered medications for this visit.    ROS:   General:  No weight loss, Fever, chills  HEENT: No recent headaches, no nasal bleeding, no visual changes, no sore throat  Neurologic: No dizziness, blackouts, seizures. No recent symptoms of stroke or mini- stroke. No recent episodes of slurred speech, or temporary blindness.  Cardiac: No recent episodes of chest pain/pressure, no shortness of breath at rest.  No shortness of breath with exertion.  Denies history of atrial fibrillation or irregular heartbeat  Vascular: No history of rest pain in feet.  No history of claudication.  No history of non-healing ulcer, No history of DVT   Pulmonary: No home oxygen, no productive cough, no hemoptysis,  No asthma or wheezing  Musculoskeletal:  [ ]  Arthritis, [ ]  Low back pain,  [ ]  Joint pain  Hematologic:No history of hypercoagulable state.  No history of easy bleeding.  No history of anemia  Gastrointestinal: No hematochezia or melena,  No gastroesophageal reflux, no trouble swallowing  Urinary: [ ]  chronic Kidney disease, [ ]  on HD - [ ]  MWF or [ ]  TTHS, [ ]  Burning with urination, [ ]  Frequent urination, [ ]  Difficulty urinating;   Skin: No rashes  Psychological: No history of anxiety,  No history of  depression   Physical Examination  Vitals:   02/11/20 1529 02/11/20 1531  BP: 131/80 126/78  Pulse: 64   Resp: 20   Temp: 98.3 F (36.8 C)   SpO2: 98%   Weight: 208 lb (94.3 kg)   Height: 6\' 3"  (1.905 m)     Body mass index is 26 kg/m.  General:  Alert and oriented, no acute distress HEENT: Normal Neck: No JVD Cardiac: Regular Rate and Rhythm Extremity Pulses:  2+ radial, brachial, femoral, dorsalis pedis, posterior tibial pulses bilaterally Musculoskeletal: No deformity or edema  Neurologic: Upper and lower extremity motor 5/5 and symmetric  DATA:  Images from the patient's recent CT angiogram were reviewed today which shows a  65% stenosis with ulceration right internal carotid artery.  There is some calcification of the left internal carotid artery but no significant stenosis.  I went over and discussed all these images with the patient today.  ASSESSMENT: Symptomatic right internal carotid artery stenosis would benefit from right carotid endarterectomy.  Risk benefits possible complications procedure details including but not limited to bleeding infection stroke risk of 1% cranial nerve injury risk of 5 to 10% were discussed with patient today.  He understands and agrees to proceed.  He will call in the near future for scheduling.   PLAN: Right carotid endarterectomy patient to call to schedule   Ruta Hinds, MD [facility] Office: [phone]

## 2020-02-16 ENCOUNTER — Other Ambulatory Visit: Payer: Self-pay

## 2020-02-19 ENCOUNTER — Telehealth: Payer: Self-pay | Admitting: Pharmacist

## 2020-02-19 NOTE — Progress Notes (Signed)
Chronic Care Management Pharmacy Assistant   Name: ZEPLIN ALESHIRE  MRN: [id number] DOB: [birth year]  Reason for Encounter: Initial Questions   PCP : [name], MD  Allergies:   Allergies  Allergen Reactions  . Ciprofloxacin Hcl     Numbness, joint pain  . Cefdinir Other (See Comments)    Fever, chills, stomach cramps and diarrhea  . Lisinopril Cough    Medications: Outpatient Encounter Medications as of 02/19/2020  Medication Sig  . aspirin EC 325 MG tablet Take 1 tablet (325 mg total) by mouth daily.  Marland Kitchen atorvastatin (LIPITOR) 40 MG tablet Take 1 tablet (40 mg total) by mouth daily.  Marland Kitchen azelastine (ASTELIN) 0.1 % nasal spray Place 1 spray into both nostrils 2 (two) times daily. Use in each nostril as directed  . fluticasone (FLONASE) 50 MCG/ACT nasal spray Place 2 sprays into both nostrils daily.  . Misc Natural Products (SUPER GREENS PO) Take 1 Scoop by mouth daily.   . Multiple Vitamin (MULTIVITAMIN) capsule Take 1 capsule by mouth daily.  Marland Kitchen telmisartan-hydrochlorothiazide (MICARDIS HCT) 80-25 MG tablet TAKE 1 TABLET ONCE DAILY   No facility-administered encounter medications on file as of 02/19/2020.    Current Diagnosis: Patient Active Problem List   Diagnosis Date Noted  . Aortic atherosclerosis ([place]) 02/03/2020  . Carotid artery stenosis 01/26/2020  . Branch retinal artery occlusion, right eye 01/19/2020  . Posterior vitreous detachment of both eyes 01/19/2020  . 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 Tear of MCL (medial collateral ligament) of knee, left, initial encounter 08/05/2017  . Gluteal tendinitis of left buttock 08/05/2017  . Left inguinal hernia 07/04/2017  . Allergic rhinitis 12/28/2015  . H/O: rheumatic fever 12/28/2015  . Prediabetes 12/27/2015  . Rising PSA level 02/21/2014  . Hx of colonic polyp 02/17/2014  . 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 Hyperlipidemia 01/07/2009  .  Asthma 06/04/2007  . Essential hypertension 09/26/2006    Goals Addressed   None     Follow-Up:  Pharmacist Review   Have you seen any other providers since your last visit? The patient states that he has seen several doctors since his last visit with Dr. [name]  Any changes in your medications or health? The patient states that Dr. [name] increased his atorvastatin to 40 mg daily and started him on an aspirin.  Any side effects from any medications? The patient states that he does not have any side affects from medications  Do you have an symptoms or problems not managed by your medications? The patient states that he does not have any symptoms or problems not managed by medications  Any concerns about your health right now? The patient states that his only concern is that he is having carotid surgery on 03/21/2020 to clean out his arteries.  Has your provider asked that you check blood pressure, blood sugar, or follow special diet at home? The patient states that he does try and check his blood pressure regularly and this morning his blood pressure was 121/79 but he states that his blood pressure average 131/67  Do you get any type of exercise on a regular basis? The patient states that he does not exercise regularly because of his eye situation not suppose to do anything strenuous, but he does walk often   Can you think of a goal you would like to reach for your health? The patient states that he would like to get his  cholesterol under control and reverse the build up with medications   Do you have any problems getting your medications from the pharmacy? The patient states that he does not have a problem with getting his medications from the pharmacy.  Is there anything that you would like to discuss during the appointment? The patient states that there is nothing that he would like to discuss at this time but may have a few questions at his appointment time  Please bring medications  and supplements to appointment   Wendy Poet, [place]

## 2020-02-22 ENCOUNTER — Other Ambulatory Visit: Payer: Self-pay

## 2020-02-22 ENCOUNTER — Encounter: Payer: Self-pay | Admitting: Internal Medicine

## 2020-02-22 ENCOUNTER — Ambulatory Visit: Payer: Medicare HMO | Admitting: Pharmacist

## 2020-02-22 DIAGNOSIS — I10 Essential (primary) hypertension: Secondary | ICD-10-CM

## 2020-02-22 DIAGNOSIS — I6529 Occlusion and stenosis of unspecified carotid artery: Secondary | ICD-10-CM

## 2020-02-22 DIAGNOSIS — R7303 Prediabetes: Secondary | ICD-10-CM

## 2020-02-22 DIAGNOSIS — E782 Mixed hyperlipidemia: Secondary | ICD-10-CM

## 2020-02-22 NOTE — Patient Instructions (Addendum)
Visit Information  Phone number for Pharmacist: [phone]  Thank you for meeting with me to discuss your medications! I look forward to working with you to achieve your health care goals. Below is a summary of what we talked about during the visit:  Goals Addressed            This Visit's Progress   . Pharmacy Care Plan       CARE PLAN ENTRY (see longitudinal plan of care for additional care plan information)  Current Barriers:  . Chronic Disease Management support, education, and care coordination needs related to Hypertension, Hyperlipidemia, and Carotid artery stenosis , Prediabetes   Hypertension BP Readings from Last 3 Encounters:  02/11/20 126/78  02/04/20 112/71  02/03/20 122/78 .  Pharmacist Clinical Goal(s): o Over the next 365 days, patient will work with PharmD and providers to maintain BP goal <130/80 . Current regimen:  o Telmisartan-HCTZ 80-25 mg daily . Interventions: o Discussed BP goals and benefits of medications for prevention of heart attack / stroke o Discussed importance of hydration to prevent low BP . Patient self care activities - Over the next 365 days, patient will: o Check BP 3-5 times weekly, document, and provide at future appointments o Ensure daily salt intake < 2300 mg/day  Hyperlipidemia / Carotid artery stenosis Lab Results  Component Value Date/Time   LDLCALC 84 02/03/2020 09:29 AM   LDLCALC 83 09/29/2019 04:04 PM   LDLCALC 118 (H) 02/10/2013 08:24 AM   LDLDIRECT 161.0 12/05/2018 02:22 PM .  Pharmacist Clinical Goal(s): o Over the next 365 days, patient will work with PharmD and providers to achieve LDL goal < 70 . Current regimen:  o Atorvastatin 40 mg daily o Aspirin 325 mg daily . Interventions: o Discussed cholesterol goals and benefits of medications for prevention of heart attack / stroke . Patient self care activities - Over the next 365 days, patient will: o Continue current medications  Prediabetes Lab Results   Component Value Date/Time   HGBA1C 5.9 02/03/2020 09:29 AM   HGBA1C 5.9 08/14/2019 09:53 AM .  Pharmacist Clinical Goal(s): o Over the next 365 days, patient will work with PharmD and providers to maintain A1c goal <6.5% . Current regimen:  o No medications . Interventions: o Discussed importance of preventing progression to diabetes to prevent complications of diabetes including kidney damage, retinal damage, and cardiovascular disease . Patient self care activities - Over the next 365 days, patient will: o Reduce carbohydrates in diet  Medication management . Pharmacist Clinical Goal(s): o Over the next 365 days, patient will work with PharmD and providers to maintain optimal medication adherence . Current pharmacy: [facility] [place] . Interventions o Comprehensive medication review performed. o Continue current medication management strategy . Patient self care activities - Over the next 365 days, patient will: o Focus on medication adherence by fill date o Take medications as prescribed o Report any questions or concerns to PharmD and/or provider(s)  Initial goal documentation      Charles Tate was given information about Chronic Care Management services today including:  1. CCM service includes personalized support from designated clinical staff supervised by his physician, including individualized plan of care and coordination with other care providers 2. 24/7 contact phone numbers for assistance for urgent and routine care needs. 3. Standard insurance, coinsurance, copays and deductibles apply for chronic care management only during months in which we provide at least 20 minutes of these services. Most insurances cover these services at 100%, however  patients may be responsible for any copay, coinsurance and/or deductible if applicable. This service may help you avoid the need for more expensive face-to-face services. 4. Only one practitioner may furnish and bill the service in a  calendar month. 5. The patient may stop CCM services at any time (effective at the end of the month) by phone call to the office staff.  Patient agreed to services and verbal consent obtained.   The patient verbalized understanding of instructions, educational materials, and care plan provided today and declined offer to receive copy of patient instructions, educational materials, and care plan.  Telephone follow up appointment with pharmacy team member scheduled for: 6 months  Charlene Brooke, PharmD, BCACP Clinical Pharmacist [facility] [phone]  Heart-Healthy Eating Plan Many factors influence your heart (coronary) health, including eating and exercise habits. Coronary risk increases with abnormal blood fat (lipid) levels. Heart-healthy meal planning includes limiting unhealthy fats, increasing healthy fats, and making other diet and lifestyle changes. What is my plan? Your health care provider may recommend that you:  Limit your fat intake to _________% or less of your total calories each day.  Limit your saturated fat intake to _________% or less of your total calories each day.  Limit the amount of cholesterol in your diet to less than _________ mg per day. What are tips for following this plan? Cooking Cook foods using methods other than frying. Baking, boiling, grilling, and broiling are all good options. Other ways to reduce fat include:  Removing the skin from poultry.  Removing all visible fats from meats.  Steaming vegetables in water or broth. Meal planning   At meals, imagine dividing your plate into fourths: ? Fill one-half of your plate with vegetables and green salads. ? Fill one-fourth of your plate with whole grains. ? Fill one-fourth of your plate with lean protein foods.  Eat 4-5 servings of vegetables per day. One serving equals 1 cup raw or cooked vegetable, or 2 cups raw leafy greens.  Eat 4-5 servings of fruit per day.  One serving equals 1 medium whole fruit,  cup dried fruit,  cup fresh, frozen, or canned fruit, or  cup 100% fruit juice.  Eat more foods that contain soluble fiber. Examples include apples, broccoli, carrots, beans, peas, and barley. Aim to get 25-30 g of fiber per day.  Increase your consumption of legumes, nuts, and seeds to 4-5 servings per week. One serving of dried beans or legumes equals  cup cooked, 1 serving of nuts is  cup, and 1 serving of seeds equals 1 tablespoon. Fats  Choose healthy fats more often. Choose monounsaturated and polyunsaturated fats, such as olive and canola oils, flaxseeds, walnuts, almonds, and seeds.  Eat more omega-3 fats. Choose salmon, mackerel, sardines, tuna, flaxseed oil, and ground flaxseeds. Aim to eat fish at least 2 times each week.  Check food labels carefully to identify foods with trans fats or high amounts of saturated fat.  Limit saturated fats. These are found in animal products, such as meats, butter, and cream. Plant sources of saturated fats include palm oil, palm kernel oil, and coconut oil.  Avoid foods with partially hydrogenated oils in them. These contain trans fats. Examples are stick margarine, some tub margarines, cookies, crackers, and other baked goods.  Avoid fried foods. General information  Eat more home-cooked food and less restaurant, buffet, and fast food.  Limit or avoid alcohol.  Limit foods that are high in starch and sugar.  Lose  weight if you are overweight. Losing just 5-10% of your body weight can help your overall health and prevent diseases such as diabetes and heart disease.  Monitor your salt (sodium) intake, especially if you have high blood pressure. Talk with your health care provider about your sodium intake.  Try to incorporate more vegetarian meals weekly. What foods can I eat? Fruits All fresh, canned (in natural juice), or frozen fruits. Vegetables Fresh or frozen vegetables (raw, steamed,  roasted, or grilled). Green salads. Grains Most grains. Choose whole wheat and whole grains most of the time. Rice and pasta, including brown rice and pastas made with whole wheat. Meats and other proteins Lean, well-trimmed beef, veal, pork, and lamb. Chicken and [place] without skin. All fish and shellfish. Wild duck, rabbit, pheasant, and venison. Egg whites or low-cholesterol egg substitutes. Dried beans, peas, lentils, and tofu. Seeds and most nuts. Dairy Low-fat or nonfat cheeses, including ricotta and mozzarella. Skim or 1% milk (liquid, powdered, or evaporated). Buttermilk made with low-fat milk. Nonfat or low-fat yogurt. Fats and oils Non-hydrogenated (trans-free) margarines. Vegetable oils, including soybean, sesame, sunflower, olive, peanut, safflower, corn, canola, and cottonseed. Salad dressings or mayonnaise made with a vegetable oil. Beverages Water (mineral or sparkling). Coffee and tea. Diet carbonated beverages. Sweets and desserts Sherbet, gelatin, and fruit ice. Small amounts of dark chocolate. Limit all sweets and desserts. Seasonings and condiments All seasonings and condiments. The items listed above may not be a complete list of foods and beverages you can eat. Contact a dietitian for more options. What foods are not recommended? Fruits Canned fruit in heavy syrup. Fruit in cream or butter sauce. Fried fruit. Limit coconut. Vegetables Vegetables cooked in cheese, cream, or butter sauce. Fried vegetables. Grains Breads made with saturated or trans fats, oils, or whole milk. Croissants. Sweet rolls. Donuts. High-fat crackers, such as cheese crackers. Meats and other proteins Fatty meats, such as hot dogs, ribs, sausage, bacon, rib-eye roast or steak. High-fat deli meats, such as salami and bologna. Caviar. Domestic duck and goose. Organ meats, such as liver. Dairy Cream, sour cream, cream cheese, and creamed cottage cheese. Whole milk cheeses. Whole or 2% milk (liquid,  evaporated, or condensed). Whole buttermilk. Cream sauce or high-fat cheese sauce. Whole-milk yogurt. Fats and oils Meat fat, or shortening. Cocoa butter, hydrogenated oils, palm oil, coconut oil, palm kernel oil. Solid fats and shortenings, including bacon fat, salt pork, lard, and butter. Nondairy cream substitutes. Salad dressings with cheese or sour cream. Beverages Regular sodas and any drinks with added sugar. Sweets and desserts Frosting. Pudding. Cookies. Cakes. Pies. Milk chocolate or white chocolate. Buttered syrups. Full-fat ice cream or ice cream drinks. The items listed above may not be a complete list of foods and beverages to avoid. Contact a dietitian for more information. Summary  Heart-healthy meal planning includes limiting unhealthy fats, increasing healthy fats, and making other diet and lifestyle changes.  Lose weight if you are overweight. Losing just 5-10% of your body weight can help your overall health and prevent diseases such as diabetes and heart disease.  Focus on eating a balance of foods, including fruits and vegetables, low-fat or nonfat dairy, lean protein, nuts and legumes, whole grains, and heart-healthy oils and fats. This information is not intended to replace advice given to you by your health care provider. Make sure you discuss any questions you have with your health care provider. Document Revised: 03/29/2017 Document Reviewed: 03/29/2017 Elsevier Patient Education  2020 [employer].

## 2020-02-22 NOTE — Chronic Care Management (AMB) (Signed)
Chronic Care Management Pharmacy  Name: Charles Tate  MRN: [id number] DOB: 08/13/1950   Chief Complaint/ HPI  Charles Tate,  69 y.o. , male presents for his Initial CCM visit with the clinical pharmacist via telephone due to COVID-19 Pandemic.  PCP : [name], MD Patient Care Team: [name], MD as PCP - General (Internal Medicine) Charlton Haws, [facility] as Pharmacist (Pharmacist)  Patient's chronic conditions include: Hypertension, Hyperlipidemia, Asthma, BPH and Allergic Rhinitis, Carotid artery stenosis, Aortic atherosclerosis, Prediabetes  Office Visits: 02/03/20 Dr [name] OV: added aspirin 325 mg. Checked lipid panel after increasing atorvastatin 2 weeks prior. 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 Dr Zadie Rhine started Aspirin 325 mg. Today rec'd increase atorvastatin to 40 mg, ordered CA ultrasound, ECHO and referral to cardiology.  Consult Visit: 02/11/20 Dr Oneida Alar (vascular surgery): will schedule R carotid endarterectomy  02/04/20 Dr Oneida Alar (vascular surgery): consult for R retinal artery embolus. Carotid artery stenosis likely source of embolus.   02/04/20 Dr Zadie Rhine (ophthalmology): referred to vascular surgery for urgent consult.  02/02/20 Dr Tamala Julian (cardiology): f/u for retinal artery plaque. Agree w/ aspirin 325 mg and aggressive lipid management, LDL goal < 70. Ordered 30-day monitor to r/o Afib.  June 2021 Coronary CT: calcium score 55.5, overall low risk for CAD.  08/14/19 Dr [name] OV: LDL elevated, strong family history of CAD, should be on a statin. Pt agreed to statin and coronary CT scan. Started atorvastatin 20 mg.  Subjective: Pt grew up in [place]. He is a Therapist, nutritional on the side, plays guitar, keyboards and plays for events around town. He is also still working, he runs a [employer] with his son.   Objective: Allergies  Allergen Reactions  . Ciprofloxacin Hcl      Numbness, joint pain  . Cefdinir Other (See Comments)    Fever, chills, stomach cramps and diarrhea  . Lisinopril Cough   Medications: Outpatient Encounter Medications as of 02/22/2020  Medication Sig  . aspirin EC 325 MG tablet Take 1 tablet (325 mg total) by mouth daily.  Marland Kitchen atorvastatin (LIPITOR) 40 MG tablet Take 1 tablet (40 mg total) by mouth daily.  . Colostrum 500 MG CAPS Take 1 capsule by mouth daily.  . fluticasone (FLONASE) 50 MCG/ACT nasal spray Place 2 sprays into both nostrils daily.  . Misc Natural Products (SUPER GREENS PO) Take 1 Scoop by mouth daily.   . Moringa 500 MG CAPS Take 1 capsule by mouth daily.  Marland Kitchen telmisartan-hydrochlorothiazide (MICARDIS HCT) 80-25 MG tablet TAKE 1 TABLET ONCE DAILY  . [DISCONTINUED] azelastine (ASTELIN) 0.1 % nasal spray Place 1 spray into both nostrils 2 (two) times daily. Use in each nostril as directed (Patient not taking: Reported on 02/22/2020)  . [DISCONTINUED] Multiple Vitamin (MULTIVITAMIN) capsule Take 1 capsule by mouth daily. (Patient not taking: Reported on 02/22/2020)   No facility-administered encounter medications on file as of 02/22/2020.    Wt Readings from Last 3 Encounters:  02/11/20 208 lb (94.3 kg)  02/04/20 208 lb (94.3 kg)  02/03/20 210 lb (95.3 kg)    Lab Results  Component Value Date   CREATININE 0.84 02/03/2020   BUN 19 02/03/2020   GFR 88.30 02/03/2020   GFRNONAA 106 06/28/2006   GFRAA 129 06/28/2006   NA 139 02/03/2020   K 3.9 02/03/2020   CALCIUM 9.0 02/03/2020   CO2 30 02/03/2020     Current Diagnosis/Assessment:  SDOH  Interventions   Flowsheet Row Most Recent Value  SDOH Interventions   Financial Strain Interventions Intervention Not Indicated      Goals Addressed            This Visit's Progress   . Pharmacy Care Plan       CARE PLAN ENTRY (see longitudinal plan of care for additional care plan information)  Current Barriers:  . Chronic Disease Management support, education, and  care coordination needs related to Hypertension, Hyperlipidemia, and Carotid artery stenosis , Prediabetes   Hypertension BP Readings from Last 3 Encounters:  02/11/20 126/78  02/04/20 112/71  02/03/20 122/78 .  Pharmacist Clinical Goal(s): o Over the next 365 days, patient will work with PharmD and providers to maintain BP goal <130/80 . Current regimen:  o Telmisartan-HCTZ 80-25 mg daily . Interventions: o Discussed BP goals and benefits of medications for prevention of heart attack / stroke o Discussed importance of hydration to prevent low BP . Patient self care activities - Over the next 365 days, patient will: o Check BP 3-5 times weekly, document, and provide at future appointments o Ensure daily salt intake < 2300 mg/day  Hyperlipidemia / Carotid artery stenosis Lab Results  Component Value Date/Time   LDLCALC 84 02/03/2020 09:29 AM   LDLCALC 83 09/29/2019 04:04 PM   LDLCALC 118 (H) 02/10/2013 08:24 AM   LDLDIRECT 161.0 12/05/2018 02:22 PM .  Pharmacist Clinical Goal(s): o Over the next 365 days, patient will work with PharmD and providers to achieve LDL goal < 70 . Current regimen:  o Atorvastatin 40 mg daily o Aspirin 325 mg daily . Interventions: o Discussed cholesterol goals and benefits of medications for prevention of heart attack / stroke . Patient self care activities - Over the next 365 days, patient will: o Continue current medications  Prediabetes Lab Results  Component Value Date/Time   HGBA1C 5.9 02/03/2020 09:29 AM   HGBA1C 5.9 08/14/2019 09:53 AM .  Pharmacist Clinical Goal(s): o Over the next 365 days, patient will work with PharmD and providers to maintain A1c goal <6.5% . Current regimen:  o No medications . Interventions: o Discussed importance of preventing progression to diabetes to prevent complications of diabetes including kidney damage, retinal damage, and cardiovascular disease . Patient self care activities - Over the next 365 days,  patient will: o Reduce carbohydrates in diet  Medication management . Pharmacist Clinical Goal(s): o Over the next 365 days, patient will work with PharmD and providers to maintain optimal medication adherence . [facility] [place] . Interventions o Comprehensive medication review performed. o Continue current medication management strategy . Patient self care activities - Over the next 365 days, patient will: o Focus on medication adherence by fill date o Take medications as prescribed o Report any questions or concerns to PharmD and/or provider(s)  Initial goal documentation       Hypertension   BP goal is:  <130/80  Office blood pressures are  BP Readings from Last 3 Encounters:  02/11/20 126/78  02/04/20 112/71  02/03/20 122/78   Patient checks BP at home 3-5x per week Patient home BP readings are ranging: 116-130/60-70s  Patient has failed these meds in the past: lisinopril, irbesartan, losartan, nebivolol  Patient is currently controlled on the following medications:  . 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)  Last lipids Lab Results  Component Value Date   CHOL 137 02/03/2020   HDL 32.40 (L) 02/03/2020   LDLCALC 84 02/03/2020   LDLDIRECT 161.0 12/05/2018   TRIG 105.0 02/03/2020   CHOLHDL 4 02/03/2020   Hepatic Function Latest Ref Rng & Units 02/03/2020 09/29/2019 08/14/2019  Total Protein 6.0 - 8.3 g/dL 6.8 6.5 7.0  Albumin 3.5 - 5.2 g/dL 4.1 - 4.4  AST 0 - 37 U/L _0 ALT 0 - 53 U/L _1 Alk Phosphatase 39 - 117 U/L 80 - 75  Total Bilirubin 0.2 - 1.2 mg/dL 0.6 0.5 0.6  Bilirubin, Direct 0.0 - 0.2 mg/dL - 0.1 -    The ASCVD Risk score Mikey Bussing DC Jr., et al., 2013) failed to calculate for the following reasons:   The patient has a prior MI or stroke diagnosis   Patient has failed these meds in past: n/a Patient is currently controlled on the following medications:  . Atorvastatin 40 mg daily . Aspirin 325 mg daily  We discussed:  Diet and exercise; Cholesterol goals; benefits of statin for ASCVD risk reduction; pt reports some soreness in shoulders, unsure if related to statin increase, it is tolerable for now  Plan  Continue current medications and control with diet and exercise    Prediabetes   A1c goal <6.5%  Recent Relevant Labs: Lab Results  Component Value Date/Time   HGBA1C 5.9 02/03/2020 09:29 AM   HGBA1C 5.9 08/14/2019 09:53 AM   GFR 88.30 02/03/2020 09:29 AM   GFR 90.31 08/14/2019 09:53 AM    No medication indicated.  We discussed: diet and exercise extensively; prediabetes A1c range; prevention of progression to diabetes  Plan  Continue control with diet and exercise  Allergic rhinitis   Patient has failed these meds in past: azelastine nasal spray Patient is currently controlled on the following medications:  . Fluticasone nasal spray PRN  We discussed:  Only uses Flonase every once in a while  Plan  Continue current medications  Vaccines   Reviewed and discussed patient's vaccination history.    Immunization History  Administered Date(s) Administered  . Fluad Quad(high Dose 65+) 11/21/2018, 02/03/2020  . Influenza Whole 11/26/2007  . Influenza-Unspecified 12/08/2015  . PFIZER SARS-COV-2 Vaccination 05/14/2019, 06/04/2019  . Pneumococcal Conjugate-13 07/04/2017  . Pneumococcal Polysaccharide-23 12/05/2018  . Td 05/04/2010    Plan  Recommended patient receive Covid booster and Shingrix  vaccine  Health Maintenance   Patient is currently controlled on the following medications:  . Super Green juice . Bovine colostrum . Moringa  We discussed:  Patient is satisfied with current regimen and denies issues. There are no known interactions between the above supplements and patient's current medications.   Plan  Continue current medications  Medication Management   Patient's preferred pharmacy is:  [facility] [place], [place] [address] Phone: [phone] Fax: [phone]  Uses pill box? No - prefers bottles Pt endorses 100% compliance  We discussed: Current pharmacy is preferred with insurance plan and patient is satisfied with pharmacy services  Plan  Continue current medication management strategy    Follow up: 6 month phone visit  Mendel Ryder  Lenord Fellers, PharmD, [place] Clinical Pharmacist [facility] at [facility] [phone]

## 2020-02-29 ENCOUNTER — Other Ambulatory Visit: Payer: Self-pay

## 2020-02-29 NOTE — Telephone Encounter (Signed)
Albin Felling,  I am covering for Mardella Layman this week. I believe the CCM code is [id number]   Thanks, Garey Ham Clinical Pharmacist [facility] at [facility]  [phone]

## 2020-03-01 ENCOUNTER — Other Ambulatory Visit: Payer: Self-pay

## 2020-03-01 DIAGNOSIS — I10 Essential (primary) hypertension: Secondary | ICD-10-CM

## 2020-03-05 ENCOUNTER — Other Ambulatory Visit: Payer: Self-pay | Admitting: Internal Medicine

## 2020-03-05 DIAGNOSIS — J0130 Acute sphenoidal sinusitis, unspecified: Secondary | ICD-10-CM

## 2020-03-06 DIAGNOSIS — Z03818 Encounter for observation for suspected exposure to other biological agents ruled out: Secondary | ICD-10-CM | POA: Diagnosis not present

## 2020-03-08 ENCOUNTER — Encounter: Payer: Self-pay | Admitting: Gastroenterology

## 2020-03-08 ENCOUNTER — Ambulatory Visit: Payer: Medicare HMO | Admitting: Gastroenterology

## 2020-03-08 VITALS — BP 108/62 | HR 72 | Ht 74.5 in | Wt 207.0 lb

## 2020-03-08 DIAGNOSIS — Z86010 Personal history of colonic polyps: Secondary | ICD-10-CM | POA: Diagnosis not present

## 2020-03-08 DIAGNOSIS — I6521 Occlusion and stenosis of right carotid artery: Secondary | ICD-10-CM

## 2020-03-08 DIAGNOSIS — D126 Benign neoplasm of colon, unspecified: Secondary | ICD-10-CM | POA: Diagnosis not present

## 2020-03-08 MED ORDER — SUTAB 1479-225-188 MG PO TABS: 24.0000 | ORAL_TABLET | ORAL | 0 refills | Status: DC

## 2020-03-08 NOTE — Patient Instructions (Signed)
You have been scheduled for a colonoscopy. Please follow written instructions given to you at your visit today.  Please pick up your prep supplies at the pharmacy within the next 1-3 days. If you use inhalers (even only as needed), please bring them with you on the day of your procedure.  We have sent the following medications to your pharmacy for you to pick up at your convenience: Sutab   If you are age 71 or older, your body mass index should be between 23-30. Your Body mass index is 26.22 kg/m. If this is out of the aforementioned range listed, please consider follow up with your Primary Care Provider.  Please contact office and let us know if you have changes to your medications after your upcoming surgery.   Thank you for choosing me and [facility].  Dr. Meridee Score

## 2020-03-10 ENCOUNTER — Other Ambulatory Visit: Payer: Self-pay | Admitting: Interventional Cardiology

## 2020-03-10 DIAGNOSIS — I4891 Unspecified atrial fibrillation: Secondary | ICD-10-CM

## 2020-03-10 DIAGNOSIS — I639 Cerebral infarction, unspecified: Secondary | ICD-10-CM

## 2020-03-12 ENCOUNTER — Encounter: Payer: Self-pay | Admitting: Gastroenterology

## 2020-03-12 ENCOUNTER — Other Ambulatory Visit (HOSPITAL_COMMUNITY): Payer: Medicare HMO

## 2020-03-12 DIAGNOSIS — D126 Benign neoplasm of colon, unspecified: Secondary | ICD-10-CM | POA: Insufficient documentation

## 2020-03-12 NOTE — Progress Notes (Signed)
[place] VISIT   Primary Care Provider [name], MD [place] [address] [phone]  Patient Profile: Charles Tate is a 70 y.o. male with a pmh significant for carotid artery disease (upcoming endarterectomy to be performed in 2022), hypertension, hyperlipidemia, reactive airway disease, osteoarthritis, diverticulosis, GERD, colon polyps (SSP and TAs).  The patient presents to the [facility] for an evaluation and management of problem(s) noted below:  Problem List 1. Serrated adenoma of colon   2. Personal history of colonic polyps   3. Stenosis of right carotid artery     History of Present Illness Please see prior consultation notes for full details of HPI.  Interval History Since our last clinic visit, the patient underwent a colonoscopy with EMR with plan for a 6 to 56-month follow-up.  This polyp returned as a sessile serrated adenoma.  The patient had no complications postprocedure.  He was to schedule for his procedure in the last month to 2.  However, the patient has been dealing with issues stemming from his carotid artery.  He has seen Dr. Oneida Alar and as result of some issues including a branch retinal artery obstruction and findings of ulcerated plaque in his carotids, the patient will be undergoing a carotid endarterectomy later this month.  He has been initiated on high-dose aspirin 325 mg daily.  As a result of this upcoming surgery he wanted to ensure safety in regards to timing of scheduling his colonoscopy as he wants to be proactive and not have issues develop in the future.  Patient has not had any changes in his bowel habits.  No other changes from a GI perspective.  GI Review of Systems Positive as above Negative for odynophagia, dysphagia, pain, nausea, vomiting, change in bowel habits  Review of Systems General: Denies fevers/chills/weight loss unintentionally Cardiovascular: Denies  chest pain Pulmonary: Denies shortness of breath Gastroenterological: See HPI Genitourinary: Denies darkened urine Hematological: Denies easy bruising/bleeding Dermatological: Denies jaundice Psychological: Mood is stable   Medications Current Outpatient Medications  Medication Sig Dispense Refill  . aspirin EC 325 MG tablet Take 1 tablet (325 mg total) by mouth daily. 30 tablet 0  . atorvastatin (LIPITOR) 40 MG tablet Take 1 tablet (40 mg total) by mouth daily. 90 tablet 3  . Colostrum 500 MG CAPS Take 500 mg by mouth daily.    . fluticasone (FLONASE) 50 MCG/ACT nasal spray SPRAY 2 SPRAYS INTO EACH NOSTRIL EVERY DAY (Patient taking differently: Place 1 spray into both nostrils daily.) 16 mL 6  . Misc Natural Products (SUPER GREENS PO) Take 1 Scoop by mouth daily.     . Moringa 500 MG CAPS Take 500 mg by mouth daily.    . Sodium Sulfate-Mag Sulfate-KCl (SUTAB) [phone] MG TABS Take 24 tablets by mouth as directed. (Patient not taking: Reported on 03/11/2020) 24 tablet 0  . telmisartan-hydrochlorothiazide (MICARDIS HCT) 80-25 MG tablet TAKE 1 TABLET ONCE DAILY (Patient taking differently: Take 1 tablet by mouth daily. TAKE 1 TABLET ONCE DAILY) 90 tablet 3   No current facility-administered medications for this visit.    Allergies Allergies  Allergen Reactions  . Ciprofloxacin Hcl     Numbness, joint pain  . Cefdinir Other (See Comments)    Fever, chills, stomach cramps and diarrhea  . Lisinopril Cough    Histories Past Medical History:  Diagnosis Date  . Allergy   . Arthritis    left hip   . GERD (gastroesophageal reflux disease)   .  Heart murmur    adolescent rheumatic fever - developed murmur   . Hyperlipidemia   . Hypertension   . RAD (reactive airway disease)    with RTIs  . Tubular adenoma    Past Surgical History:  Procedure Laterality Date  . COLONOSCOPY    . colonoscopy with polypectomy  2013   Dr Ferdinand Lango, [facility]  . COLONOSCOPY WITH PROPOFOL N/A  07/01/2019   Procedure: COLONOSCOPY WITH PROPOFOL;  Surgeon: Rush Landmark Telford Nab., MD;  Location: [facility];  Service: Gastroenterology;  Laterality: N/A;  . ENDOSCOPIC MUCOSAL RESECTION N/A 07/01/2019   Procedure: ENDOSCOPIC MUCOSAL RESECTION;  Surgeon: Rush Landmark Telford Nab., MD;  Location: [facility];  Service: Gastroenterology;  Laterality: N/A;  . HEMOSTASIS CLIP PLACEMENT  07/01/2019   Procedure: HEMOSTASIS CLIP PLACEMENT;  Surgeon: Irving Copas., MD;  Location: [facility];  Service: Gastroenterology;;  . POLYPECTOMY    . POLYPECTOMY  07/01/2019   Procedure: POLYPECTOMY;  Surgeon: Mansouraty, Telford Nab., MD;  Location: [facility];  Service: Gastroenterology;;  . Lia Foyer LIFTING INJECTION  07/01/2019   Procedure: SUBMUCOSAL LIFTING INJECTION;  Surgeon: Irving Copas., MD;  Location: [facility];  Service: Gastroenterology;;  . Lia Foyer TATTOO INJECTION  07/01/2019   Procedure: SUBMUCOSAL TATTOO INJECTION;  Surgeon: Irving Copas., MD;  Location: [facility];  Service: Gastroenterology;;  . Arnetha Courser TOOTH EXTRACTION     Social History   Socioeconomic History  . Marital status: Married    Spouse name: [name]  . Number of children: 2  . Years of education: Not on file  . Highest education level: Not on file  Occupational History  . Occupation: self employed   Tobacco Use  . Smoking status: Former Research scientist (life sciences)  . Smokeless tobacco: Never Used  . Tobacco comment: intermittent, short term smoker as a teen. Some second hand smoke in 37s & 30s  Vaping Use  . Vaping Use: Never used  Substance and Sexual Activity  . Alcohol use: Yes    Comment:   very rarely  . Drug use: No  . Sexual activity: Not on file  Other Topics Concern  . Not on file  Social History Narrative   Exercise: none regularly   Social Determinants of Health   Financial Resource Strain: Low Risk   . Difficulty of Paying Living Expenses: Not hard at all  Food Insecurity: Not  on file  Transportation Needs: Not on file  Physical Activity: Not on file  Stress: Not on file  Social Connections: Not on file  Intimate Partner Violence: Not on file   Family History  Problem Relation Age of Onset  . Arthritis Mother   . Hyperlipidemia Mother   . Lupus Mother   . Heart attack Father 50  . Hypertension Sister   . Heart attack Paternal Grandmother 32  . Diabetes Paternal Aunt   . Stroke Maternal Grandfather 91  . Cancer Paternal Grandfather        ? stomach; in 24s  . Colon cancer Paternal Grandfather   . Colon polyps Neg Hx   . Esophageal cancer Neg Hx   . Rectal cancer Neg Hx   . Stomach cancer Neg Hx   . Inflammatory bowel disease Neg Hx   . Liver disease Neg Hx   . Pancreatic cancer Neg Hx    I have reviewed his medical, social, and family history in detail and updated the electronic medical record as necessary.    PHYSICAL EXAMINATION  BP 108/62   Pulse 72  Ht 6' 2.5" (1.892 m)   Wt 207 lb (93.9 kg)   SpO2 96%   BMI 26.22 kg/m  Wt Readings from Last 3 Encounters:  03/08/20 207 lb (93.9 kg)  02/11/20 208 lb (94.3 kg)  02/04/20 208 lb (94.3 kg)  GEN: NAD, appears stated age, doesn't appear chronically ill PSYCH: Cooperative, without pressured speech EYE: Conjunctivae pink, sclerae anicteric ENT: Masked CV: Nontachycardic RESP: No audible wheezing GI: NABS, soft, NT/ND, without rebound or guarding MSK/EXT: No significant lower extremity edema SKIN: No jaundice NEURO:  Alert & Oriented x 3, no focal deficits   REVIEW OF DATA  I reviewed the following data at the time of this encounter:  GI Procedures and Studies  APRIL 2021 colonoscopy - Hemorrhoids found on digital rectal exam. - The examined portion of the ileum was normal. - One 2 mm polyp in the cecum, removed with a cold snare. Resected and retrieved. - One at least 30 mm polyp in the proximal ascending colon, removed with piecemeal mucosal resection. Resected and retrieved.  Clips (MR conditional) were placed. Tattooed distal to the resection site for marking purposes. - Diverticulosis in the entire examined colon. - Normal mucosa in the entire examined colon. - Non-bleeding non-thrombosed external and internal hemorrhoids. Pathology FINAL MICROSCOPIC DIAGNOSIS:  A. COLON, CECUM, POLYPECTOMY:  - Benign colonic mucosa.  - No adenomatous change or carcinoma.  B. COLON, ASCENDING, EMR, POLYPECTOMY:  - Sessile serrated polyp (S) without cytologic dysplasia.  - No evidence of carcinoma.   Laboratory Studies  Reviewed those in epic  Imaging Studies  No relevant studies to review   ASSESSMENT  Mr. Vanderweide is a 70 y.o. male with a pmh significant for carotid artery disease (upcoming endarterectomy to be performed in 2022), hypertension, hyperlipidemia, reactive airway disease, osteoarthritis, diverticulosis, GERD, colon polyps (SSP and TAs).  The patient is seen today for evaluation and management of:  1. Serrated adenoma of colon   2. Personal history of colonic polyps   3. Stenosis of right carotid artery    The patient is clinically and hemodynamically stable from a GI perspective.  Although ideally we would perform his colonoscopy within the next month, as a result of his known carotid artery disease and need for carotid endarterectomy, this takes precedence.  We will plan to follow-up in the course of the coming months for colonoscopy with EMR follow-up of the large sessile serrated adenoma that we resected in 2021.  I will reach out to Dr. Oneida Alar to see if there is any planned anticoagulation time.  Such that if we need to push out his colonoscopy a little bit further and that is okay as well.  If the patient is on aspirin solely, I am okay with that being continued even at the time of his procedure unless significant bleeding were to occur.  We did discuss the risks of recurrent adenoma which is the reason why we repeat/perform follow-up colonoscopies at certain  intervals.  We will have Endorotor and OVESCO FTRD and avulsion and other EMR techniques available at that time.  Hopefully, we will be able to put the patient back to a normal colon cancer screening perspective after we see follow-up looks well from prior resection.  The risks and benefits of endoscopic evaluation were discussed with the patient; these include but are not limited to the risk of perforation, infection, bleeding, missed lesions, lack of diagnosis, severe illness requiring hospitalization, as well as anesthesia and sedation related illnesses.  The patient is  agreeable to proceed.  All patient questions were answered to the best of my ability, and the patient agrees to the aforementioned plan of action with follow-up as indicated.   PLAN  Proceed with placing recall for colonoscopy with EMR tentatively for March/April Reach out to Dr. Oneida Alar to see if there is an anticipated anticoagulation.  After his carotid endarterectomy in case we need to push out colonoscopy further No need for preprocedural labs currently until we know the timing of his repeat colonoscopy   Orders Placed This Encounter  Procedures  . Procedural/ Surgical Case Request: COLONOSCOPY WITH PROPOFOL, ENDOSCOPIC MUCOSAL RESECTION  . Ambulatory referral to Gastroenterology  . Endorotor    New Prescriptions   SODIUM SULFATE-MAG SULFATE-KCL (SUTAB) [phone] MG TABS    Take 24 tablets by mouth as directed.   Modified Medications   No medications on file    Planned Follow Up No follow-ups on file.   Total Time in Face-to-Face and in Coordination of Care for patient including independent/personal interpretation/review of prior testing, medical history, examination, medication adjustment, communicating results with the patient directly, and documentation with the EHR is 25 minutes.   Justice Britain, MD [facility] Advanced [facility]oscopy Office # [phone]

## 2020-03-14 ENCOUNTER — Telehealth: Payer: Self-pay | Admitting: Interventional Cardiology

## 2020-03-14 NOTE — Telephone Encounter (Signed)
Spoke with pt and made him aware that results have been downloaded now but I am waiting for Dr. Tamala Julian to review for official results.  Pt verbalized understanding and was appreciative for call.

## 2020-03-14 NOTE — Telephone Encounter (Signed)
Patient called and had some questions regarding his monitor results. Please call back

## 2020-03-14 NOTE — Progress Notes (Signed)
Your procedure is scheduled on Monday Mar 21, 2020.  Report to [facility] Main Entrance "A" at 05:30 A.M., and check in at the Admitting office.  Call this number if you have problems the morning of surgery: [phone]  Call [phone] if you have any questions prior to your surgery date Monday-Friday 8am-4pm   Remember: Do not eat or drink after midnight the night before your surgery   Take these medicines the morning of surgery with A SIP OF WATER: aspirin EC  atorvastatin (LIPITOR)   As of today, STOP taking any Aspirin (unless otherwise instructed by your surgeon), Aleve, Naproxen, Ibuprofen, Motrin, Advil, Goody's, BC's, all herbal medications, fish oil, and all vitamins.    The Morning of Surgery  Do not wear jewelry  Do not wear lotions, powders, colognes, or deodorant   Men may shave face and neck.  Do not bring valuables to the hospital.  [facility] is not responsible for any belongings or valuables.  If you are a smoker, DO NOT Smoke 24 hours prior to surgery  If you wear a CPAP at night please bring your mask the morning of surgery   Remember that you must have someone to transport you home after your surgery, and remain with you for 24 hours if you are discharged the same day.   Please bring cases for contacts, glasses, hearing aids, dentures or bridgework because it cannot be worn into surgery.    Leave your suitcase in the car.  After surgery it may be brought to your room.  For patients admitted to the hospital, discharge time will be determined by your treatment team.  Patients discharged the day of surgery will not be allowed to drive home.    Special instructions:   [facility]- Preparing For Surgery  Before surgery, you can play an important role. Because skin is not sterile, your skin needs to be as free of germs as possible. You can reduce the number of germs on your skin by washing with CHG (chlorahexidine gluconate) Soap before surgery.   CHG is an antiseptic cleaner which kills germs and bonds with the skin to continue killing germs even after washing.    Oral Hygiene is also important to reduce your risk of infection.  Remember - BRUSH YOUR TEETH THE MORNING OF SURGERY WITH YOUR REGULAR TOOTHPASTE  Please do not use if you have an allergy to CHG or antibacterial soaps. If your skin becomes reddened/irritated stop using the CHG.  Do not shave (including legs and underarms) for at least 48 hours prior to first CHG shower. It is OK to shave your face.  Please follow these instructions carefully.   1. Shower the NIGHT BEFORE SURGERY and the MORNING OF SURGERY with CHG Soap.   2. If you chose to wash your hair and body, wash as usual with your normal shampoo and body-wash/soap.  3. Rinse your hair and body thoroughly to remove the shampoo and soap.  4. Apply CHG directly to the skin (ONLY FROM THE NECK DOWN) and wash gently with a scrungie or a clean washcloth.   5. Do not use on open wounds or open sores. Avoid contact with your eyes, ears, mouth and genitals (private parts). Wash Face and genitals (private parts)  with your normal soap.   6. Wash thoroughly, paying special attention to the area where your surgery will be performed.  7. Thoroughly rinse your body with warm water from the neck down.  8. DO NOT shower/wash  with your normal soap after using and rinsing off the CHG Soap.  9. Pat yourself dry with a CLEAN TOWEL.  10. Wear CLEAN PAJAMAS to bed the night before surgery  11. Place CLEAN SHEETS on your bed the night of your first shower and DO NOT SLEEP WITH PETS.  12. Wear comfortable clothes the morning of surgery.     Day of Surgery:  Please shower the morning of surgery with the CHG soap Do not apply any deodorants/lotions. Please wear clean clothes to the hospital/surgery center.   Remember to brush your teeth WITH YOUR REGULAR TOOTHPASTE.   Please read over the following fact sheets that you were  given.

## 2020-03-15 ENCOUNTER — Encounter (HOSPITAL_COMMUNITY): Payer: Self-pay

## 2020-03-15 ENCOUNTER — Encounter (HOSPITAL_COMMUNITY)
Admission: RE | Admit: 2020-03-15 | Discharge: 2020-03-15 | Disposition: A | Discharge status: Home / Self Care | Payer: Medicare HMO | Source: Ambulatory Visit | Attending: Vascular Surgery | Admitting: Vascular Surgery

## 2020-03-15 ENCOUNTER — Other Ambulatory Visit: Payer: Self-pay

## 2020-03-15 DIAGNOSIS — I6521 Occlusion and stenosis of right carotid artery: Secondary | ICD-10-CM | POA: Diagnosis not present

## 2020-03-15 DIAGNOSIS — Z7982 Long term (current) use of aspirin: Secondary | ICD-10-CM | POA: Diagnosis not present

## 2020-03-15 DIAGNOSIS — E785 Hyperlipidemia, unspecified: Secondary | ICD-10-CM | POA: Insufficient documentation

## 2020-03-15 DIAGNOSIS — I10 Essential (primary) hypertension: Secondary | ICD-10-CM | POA: Diagnosis not present

## 2020-03-15 DIAGNOSIS — Z79899 Other long term (current) drug therapy: Secondary | ICD-10-CM | POA: Diagnosis not present

## 2020-03-15 DIAGNOSIS — Z01812 Encounter for preprocedural laboratory examination: Secondary | ICD-10-CM | POA: Insufficient documentation

## 2020-03-15 LAB — URINALYSIS, ROUTINE W REFLEX MICROSCOPIC
Bilirubin Urine: NEGATIVE
Glucose, UA: NEGATIVE mg/dL
Hgb urine dipstick: NEGATIVE
Ketones, ur: NEGATIVE mg/dL
Leukocytes,Ua: NEGATIVE
Nitrite: NEGATIVE
Protein, ur: NEGATIVE mg/dL
Specific Gravity, Urine: 1.015 (ref 1.005–1.030)
pH: 5 (ref 5.0–8.0)

## 2020-03-15 LAB — SURGICAL PCR SCREEN
MRSA, PCR: NEGATIVE
Staphylococcus aureus: NEGATIVE

## 2020-03-15 LAB — CBC
HCT: 44.2 % (ref 39.0–52.0)
Hemoglobin: 15.1 g/dL (ref 13.0–17.0)
MCH: 30.3 pg (ref 26.0–34.0)
MCHC: 34.2 g/dL (ref 30.0–36.0)
MCV: 88.6 fL (ref 80.0–100.0)
Platelets: 219 10*3/uL (ref 150–400)
RBC: 4.99 MIL/uL (ref 4.22–5.81)
RDW: 12.3 % (ref 11.5–15.5)
WBC: 5.1 10*3/uL (ref 4.0–10.5)
nRBC: 0 % (ref 0.0–0.2)

## 2020-03-15 LAB — COMPREHENSIVE METABOLIC PANEL
ALT: 27 U/L (ref 0–44)
AST: 23 U/L (ref 15–41)
Albumin: 3.9 g/dL (ref 3.5–5.0)
Alkaline Phosphatase: 77 U/L (ref 38–126)
Anion gap: 9 (ref 5–15)
BUN: 17 mg/dL (ref 8–23)
CO2: 27 mmol/L (ref 22–32)
Calcium: 9.2 mg/dL (ref 8.9–10.3)
Chloride: 101 mmol/L (ref 98–111)
Creatinine, Ser: 0.84 mg/dL (ref 0.61–1.24)
GFR, Estimated: >60 mL/min (ref >60)
Glucose, Bld: 109 mg/dL — ABNORMAL HIGH (ref 70–99)
Potassium: 3.7 mmol/L (ref 3.5–5.1)
Sodium: 137 mmol/L (ref 135–145)
Total Bilirubin: 0.7 mg/dL (ref 0.3–1.2)
Total Protein: 7 g/dL (ref 6.5–8.1)

## 2020-03-15 LAB — PROTIME-INR
INR: 1.1 (ref 0.8–1.2)
Prothrombin Time: 13.7 seconds (ref 11.4–15.2)

## 2020-03-15 LAB — APTT: aPTT: 34 seconds (ref 24–36)

## 2020-03-15 NOTE — Progress Notes (Signed)
/  PCP - Billey Gosling Cardiologist - Dr. Daneen Schick  PPM/ICD - n/a Device Orders -  Rep Notified -   Chest x-ray - 09/01/19 Marthann Schiller - 02/02/20 Stress Test - 30 years ago ECHO - 01/21/20 Cardiac Cath - patient denies  Sleep Study - patient denies, negative stop bang CPAP -   Fasting Blood Sugar - n/a Checks Blood Sugar _____ times a day  Blood Thinner Instructions: Aspirin Instructions: continue ASA and take morning of surgery  ERAS Protcol - n/a, NPO after midnight PRE-SURGERY Ensure or G2-   COVID TEST- scheduled for 03/19/20, reinforced quarantine instructions after COVID test   Anesthesia review: yes, recent Echo, hx of heart murmur  Patient denies shortness of breath, fever, cough and chest pain at PAT appointment   All instructions explained to the patient, with a verbal understanding of the material. Patient agrees to go over the instructions while at home for a better understanding. Patient also instructed to self quarantine after being tested for COVID-19. The opportunity to ask questions was provided.

## 2020-03-16 ENCOUNTER — Encounter (HOSPITAL_COMMUNITY): Payer: Self-pay | Admitting: Vascular Surgery

## 2020-03-16 ENCOUNTER — Encounter (HOSPITAL_COMMUNITY): Payer: Self-pay | Admitting: Anesthesiology

## 2020-03-16 NOTE — Anesthesia Preprocedure Evaluation (Deleted)
Anesthesia Evaluation    Reviewed: Allergy & Precautions, Patient's Chart, lab work & pertinent test results  Airway        Dental   Pulmonary asthma , former smoker,           Cardiovascular hypertension, Pt. on medications + Peripheral Vascular Disease       Neuro/Psych negative neurological ROS     GI/Hepatic Neg liver ROS, GERD  ,  Endo/Other  negative endocrine ROS  Renal/GU negative Renal ROS     Musculoskeletal  (+) Arthritis ,   Abdominal   Peds  Hematology negative hematology ROS (+)   Anesthesia Other Findings   Reproductive/Obstetrics                            Anesthesia Physical Anesthesia Plan  ASA: III  Anesthesia Plan: General   Post-op Pain Management:    Induction: Intravenous  PONV Risk Score and Plan: 2 and Ondansetron and Dexamethasone  Airway Management Planned: Oral ETT  Additional Equipment: Arterial line  Intra-op Plan:   Post-operative Plan: Extubation in OR  Informed Consent:   Plan Discussed with:   Anesthesia Plan Comments: (PAT note written 03/16/2020 by Myra Gianotti, PA-C. )       Anesthesia Quick Evaluation

## 2020-03-16 NOTE — Progress Notes (Signed)
Anesthesia Chart Review:  Case: [id number] Date/Time: 03/21/20 0715   Procedure: Right Carotid Endarterectomy (Right )   Anesthesia type: General   Pre-op diagnosis: Carotid Artery Stenosis   Location: [facility] OR ROOM 52 / [facility]   Surgeons: Elam Dutch, MD      DISCUSSION: Patient is a 70 year old male scheduled for the above procedure. He was referred to vascular surgeon by cardiologist Dr. Tamala Julian and retinal specialist Dr. Zadie Rhine following finding of right retinal artery occlusion and right ICA stenosis.   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 He is currently scheduled for 6-9 follow-up colonoscopy 05/30/20.    Last visit with cardiologist Dr. Tamala Julian on 02/02/20. Referred to vascular surgery. Preliminary report of 30 day event monitor did not document any afib. One year follow-up recommended.   Patient to continue ASA perioperatively.  [employer] COVID-19 vaccine 06/04/19. 03/15/20 presurgical COVID-19 test negative. Anesthesia team to evaluate on the day of surgery.    VS: BP (!) 118/59   Pulse 70   Temp 36.6 C (Oral)   Resp 17   Ht 6\' 3"  (1.905 m)   Wt 93.3 kg   SpO2 98%   BMI 25.71 kg/m    PROVIDERS: [name], MD is PCP Daneen Schick, MD is cardiologist Mansouraty, Valarie Merino, MD & Kelly Splinter, MD are GI Deloria Lair, MD is retinal specialist   LABS: Labs reviewed: Acceptable for surgery. (all labs ordered are listed, but only abnormal results are displayed)  Labs Reviewed  COMPREHENSIVE METABOLIC PANEL - Abnormal; Notable for the following components:      Result Value   Glucose, Bld 109 (*)    All other  components within normal limits  SURGICAL PCR SCREEN  CBC  PROTIME-INR  APTT  URINALYSIS, ROUTINE W REFLEX MICROSCOPIC    IMAGES: CT head/ CTA head/neck 02/08/20: IMPRESSION: 1. Soft plaque at the right common carotid bifurcation with 65% right ICA origin stenosis and small focus of plaque ulceration. 2. No significant left-sided carotid artery stenosis. 3. Mild bilateral intracranial vertebral artery stenosis. 4. Unremarkable CT appearance of the brain for age. 5. Aortic Atherosclerosis (ICD10-I70.0).   EKG: 02/02/20: NSR, non-specific ST abnormality, biatrial abnormality.    CV: Cardiac event monitor 02/07/20-03/07/20: Summary (Preliminary): Baseline sample showed Sinus Rhythm with a heart rate of 95 bpm. There were 0 critical, 0 serious, and 6 stable events that occurred [SR, ST, ST w/PVCs 2 in 1 minute, SR w/PACs, SB occurred during detected events]   Carotid US 01/25/20: Summary:  - Right Carotid: Velocities in the right ICA are consistent with a 40-59% stenosis.  - Left Carotid: Velocities in the left ICA are consistent with a 1-39% stenosis.  - Vertebrals: Left vertebral artery demonstrates antegrade flow. Small caliber right vertebral artery with atypical antegrade flow noted.  - Subclavians: Normal flow hemodynamics were seen in bilateral subclavian arteries.  - Elevated velocities of 260/32 cm/s and turbulent flow noted in the right bulb.   Echo 01/21/20: IMPRESSIONS  1. Left ventricular ejection fraction, by estimation, is 65 to 70%. The  left ventricle has normal function. The left ventricle has no regional  wall motion abnormalities. There is mild left ventricular hypertrophy.  Left ventricular diastolic parameters  are  consistent with Grade I diastolic dysfunction (impaired relaxation).  2. Right ventricular systolic function is normal. The right ventricular  size is normal.  3. The mitral valve is normal in structure. Trivial mitral valve  regurgitation.   4. The aortic valve is normal in structure. Aortic valve regurgitation is  not visualized. No aortic stenosis is present.   CT Cardiac Scoring 08/28/19: IMPRESSION: Coronary calcium score of 55.5 Agatston units. This was 34th percentile for age and sex matched control, suggesting low risk for future cardiac events.  Reported a remote history of stress test ~ 30 years ago.   Past Medical History:  Diagnosis Date  . Allergy   . Arthritis    left hip   . GERD (gastroesophageal reflux disease)    per patient, resolved. 03/15/20  . Heart murmur    adolescent rheumatic fever - developed murmur   . Hyperlipidemia   . Hypertension   . RAD (reactive airway disease)    with RTIs  . Tubular adenoma     Past Surgical History:  Procedure Laterality Date  . COLONOSCOPY    . colonoscopy with polypectomy  2013   Dr Ferdinand Lango, [facility]  . COLONOSCOPY WITH PROPOFOL N/A 07/01/2019   Procedure: COLONOSCOPY WITH PROPOFOL;  Surgeon: Rush Landmark Telford Nab., MD;  Location: [facility];  Service: Gastroenterology;  Laterality: N/A;  . ENDOSCOPIC MUCOSAL RESECTION N/A 07/01/2019   Procedure: ENDOSCOPIC MUCOSAL RESECTION;  Surgeon: Rush Landmark Telford Nab., MD;  Location: [facility];  Service: Gastroenterology;  Laterality: N/A;  . HEMOSTASIS CLIP PLACEMENT  07/01/2019   Procedure: HEMOSTASIS CLIP PLACEMENT;  Surgeon: Irving Copas., MD;  Location: [facility];  Service: Gastroenterology;;  . POLYPECTOMY    . POLYPECTOMY  07/01/2019   Procedure: POLYPECTOMY;  Surgeon: Mansouraty, Telford Nab., MD;  Location: [facility];  Service: Gastroenterology;;  . Lia Foyer LIFTING INJECTION  07/01/2019   Procedure: SUBMUCOSAL LIFTING INJECTION;  Surgeon: Irving Copas., MD;  Location: [facility];  Service: Gastroenterology;;  . Lia Foyer TATTOO INJECTION  07/01/2019   Procedure: SUBMUCOSAL TATTOO INJECTION;  Surgeon: Irving Copas., MD;  Location: [facility];  Service:  Gastroenterology;;  . Arnetha Courser TOOTH EXTRACTION      MEDICATIONS: . aspirin EC 325 MG tablet  . atorvastatin (LIPITOR) 40 MG tablet  . Colostrum 500 MG CAPS  . fluticasone (FLONASE) 50 MCG/ACT nasal spray  . Misc Natural Products (SUPER GREENS PO)  . [place]a 500 MG CAPS  . Sodium Sulfate-Mag Sulfate-KCl (SUTAB) [phone] MG TABS  . telmisartan-hydrochlorothiazide (MICARDIS HCT) 80-25 MG tablet   No current facility-administered medications for this encounter.    Myra Gianotti, PA-C Surgical Short Stay/Anesthesiology [facility] Phone [phone] [facility] Phone [phone] 03/16/2020 11:20 AM

## 2020-03-19 ENCOUNTER — Other Ambulatory Visit (HOSPITAL_COMMUNITY)
Admission: RE | Admit: 2020-03-19 | Discharge: 2020-03-19 | Disposition: A | Discharge status: Home / Self Care | Payer: Medicare HMO | Source: Ambulatory Visit | Attending: Vascular Surgery | Admitting: Vascular Surgery

## 2020-03-19 DIAGNOSIS — Z01812 Encounter for preprocedural laboratory examination: Secondary | ICD-10-CM | POA: Insufficient documentation

## 2020-03-19 DIAGNOSIS — Z20822 Contact with and (suspected) exposure to covid-19: Secondary | ICD-10-CM | POA: Diagnosis not present

## 2020-03-19 LAB — SARS CORONAVIRUS 2 (TAT 6-24 HRS): SARS Coronavirus 2: NEGATIVE

## 2020-03-21 ENCOUNTER — Inpatient Hospital Stay (HOSPITAL_COMMUNITY): Admission: RE | Admit: 2020-03-21 | Payer: Medicare HMO | Source: Home / Self Care | Admitting: Vascular Surgery

## 2020-03-21 NOTE — Progress Notes (Signed)
TC from patient stating he would not be able to make it in today due to road conditions. Instructed patient to contact the office later today to reschedule. Dr Oneida Alar notified, OR desk notified(marge), charge CRNA notified(Rebecca)

## 2020-03-22 ENCOUNTER — Telehealth: Payer: Self-pay

## 2020-03-22 NOTE — Telephone Encounter (Signed)
The pt has been advised that colon will be competed in about 1-2 months.  I will send a staff message to contact pt in few months.  He also was asked to call after he has recovered.

## 2020-03-22 NOTE — Telephone Encounter (Signed)
-----   Message from Irving Copas., MD sent at 03/22/2020  3:56 AM EST ----- Regarding: RE: [facility] patient Maurilio, Thank you for update. No worries, no matter when we do his colonoscopy it is very infrequent that I end up stopping the aspirin so good to know. Aoi Kouns, if you can let the patient know that Dr. Oneida Alar and I have discussed this case.  Once he has completed his procedure with Dr. Oneida Alar, I would anticipate trying to do his procedure 1 to 2 months after as long as he has done well.  Please also make a notation in the chart that we will continue aspirin. GM ----- Message ----- From: Elam Dutch, MD Sent: 03/21/2020  12:22 PM EST To: Irving Copas., MD Subject: RE: [facility] patient                             He should just be on ASA post op.  He cancelled his case today due to snow.  I would prefer to not stop the ASA for at least 6 weeks post op then he should be able to stop it periprocedure.  Thanks  Juanda Crumble ----- Message ----- From: Irving Copas., MD Sent: 03/12/2020   5:02 AM EST To: Elam Dutch, MD Subject: [facility] patient                                 Juanda Crumble, I hope you are well you will be performing a carotid endarterectomy on Mr. Rauch in the coming weeks.  He has a large polyp that we resected last year that he needs a follow-up for this year.  Certainly your surgery takes precedence over repeat colonoscopy for surveillance.  However, I was just wondering if you anticipate any anticoagulation needs after the endarterectomy has been performed?  I will be putting in a follow-up for the patient and so just wanted to have a sense of whether you thought he be on anything more than high-dose aspirin in the near future.  Thanks. GM

## 2020-03-25 ENCOUNTER — Other Ambulatory Visit: Payer: Self-pay

## 2020-03-26 ENCOUNTER — Other Ambulatory Visit (HOSPITAL_COMMUNITY): Payer: Medicare HMO

## 2020-03-28 ENCOUNTER — Encounter (HOSPITAL_COMMUNITY): Admission: RE | Payer: Self-pay | Source: Home / Self Care

## 2020-03-28 SURGERY — ENDARTERECTOMY, CAROTID
Anesthesia: General | Laterality: Right

## 2020-03-31 ENCOUNTER — Encounter: Payer: Medicare HMO | Admitting: Vascular Surgery

## 2020-04-01 ENCOUNTER — Other Ambulatory Visit: Payer: Self-pay

## 2020-04-01 NOTE — Pre-Procedure Instructions (Signed)
Your procedure is scheduled on Monday, February 7th at 7:30 A.M.  Report to [facility] Main Entrance "A" at 5:30 A.M., and check in at the Admitting office.  Call this number if you have problems the morning of surgery:  [phone]  Call [phone] if you have any questions prior to your surgery date Monday-Friday 8am-4pm    Remember:  Do not eat or drink after midnight the night before your surgery    Take these medicines the morning of surgery with A SIP OF WATER  aspirin EC atorvastatin (LIPITOR) fluticasone (FLONASE)   As of today, STOP taking any Aleve, Naproxen, Ibuprofen, Motrin, Advil, Goody's, BC's, all herbal medications, fish oil, and all vitamins.                      Do not wear jewelry.            Do not wear lotions, powders, colognes, or deodorant.            Men may shave face and neck.            Do not bring valuables to the hospital.            [facility] is not responsible for any belongings or valuables.  Do NOT Smoke (Tobacco/Vaping) or drink Alcohol 24 hours prior to your procedure If you use a CPAP at night, you may bring all equipment for your overnight stay.   Contacts, glasses, dentures or bridgework may not be worn into surgery.      For patients admitted to the hospital, discharge time will be determined by your treatment team.   Patients discharged the day of surgery will not be allowed to drive home, and someone needs to stay with them for 24 hours.    Special instructions:   [facility]- Preparing For Surgery  Before surgery, you can play an important role. Because skin is not sterile, your skin needs to be as free of germs as possible. You can reduce the number of germs on your skin by washing with CHG (chlorahexidine gluconate) Soap before surgery.  CHG is an antiseptic cleaner which kills germs and bonds with the skin to continue killing germs even after washing.    Oral Hygiene is also important to reduce your risk of infection.   Remember - BRUSH YOUR TEETH THE MORNING OF SURGERY WITH YOUR REGULAR TOOTHPASTE  Please do not use if you have an allergy to CHG or antibacterial soaps. If your skin becomes reddened/irritated stop using the CHG.  Do not shave (including legs and underarms) for at least 48 hours prior to first CHG shower. It is OK to shave your face.  Please follow these instructions carefully.   1. Shower the NIGHT BEFORE SURGERY and the MORNING OF SURGERY with CHG Soap.   2. If you chose to wash your hair, wash your hair first as usual with your normal shampoo.  3. After you shampoo, rinse your hair and body thoroughly to remove the shampoo.  4. Use CHG as you would any other liquid soap. You can apply CHG directly to the skin and wash gently with a scrungie or a clean washcloth.   5. Apply the CHG Soap to your body ONLY FROM THE NECK DOWN.  Do not use on open wounds or open sores. Avoid contact with your eyes, ears, mouth and genitals (private parts). Wash Face and genitals (private parts)  with your normal soap.   6. Wash  thoroughly, paying special attention to the area where your surgery will be performed.  7. Thoroughly rinse your body with warm water from the neck down.  8. DO NOT shower/wash with your normal soap after using and rinsing off the CHG Soap.  9. Pat yourself dry with a CLEAN TOWEL.  10. Wear CLEAN PAJAMAS to bed the night before surgery  11. Place CLEAN SHEETS on your bed the night of your first shower and DO NOT SLEEP WITH PETS.   Day of Surgery: Wear Clean/Comfortable clothing the morning of surgery Do not apply any deodorants/lotions.   Remember to brush your teeth WITH YOUR REGULAR TOOTHPASTE.   Please read over the following fact sheets that you were given.

## 2020-04-04 ENCOUNTER — Encounter (HOSPITAL_COMMUNITY): Payer: Self-pay

## 2020-04-04 ENCOUNTER — Encounter (HOSPITAL_COMMUNITY)
Admission: RE | Admit: 2020-04-04 | Discharge: 2020-04-04 | Disposition: A | Discharge status: Home / Self Care | Payer: Medicare HMO | Source: Ambulatory Visit | Attending: Vascular Surgery | Admitting: Vascular Surgery

## 2020-04-04 ENCOUNTER — Other Ambulatory Visit: Payer: Self-pay

## 2020-04-04 DIAGNOSIS — Z01812 Encounter for preprocedural laboratory examination: Secondary | ICD-10-CM | POA: Diagnosis present

## 2020-04-04 LAB — CBC
HCT: 41.5 % (ref 39.0–52.0)
Hemoglobin: 14.4 g/dL (ref 13.0–17.0)
MCH: 30.4 pg (ref 26.0–34.0)
MCHC: 34.7 g/dL (ref 30.0–36.0)
MCV: 87.6 fL (ref 80.0–100.0)
Platelets: 201 10*3/uL (ref 150–400)
RBC: 4.74 MIL/uL (ref 4.22–5.81)
RDW: 12.3 % (ref 11.5–15.5)
WBC: 6.1 10*3/uL (ref 4.0–10.5)
nRBC: 0 % (ref 0.0–0.2)

## 2020-04-04 LAB — BASIC METABOLIC PANEL
Anion gap: 10 (ref 5–15)
BUN: 17 mg/dL (ref 8–23)
CO2: 26 mmol/L (ref 22–32)
Calcium: 9.4 mg/dL (ref 8.9–10.3)
Chloride: 102 mmol/L (ref 98–111)
Creatinine, Ser: 0.89 mg/dL (ref 0.61–1.24)
GFR, Estimated: >60 mL/min (ref >60)
Glucose, Bld: 111 mg/dL — ABNORMAL HIGH (ref 70–99)
Potassium: 3.5 mmol/L (ref 3.5–5.1)
Sodium: 138 mmol/L (ref 135–145)

## 2020-04-04 NOTE — Progress Notes (Signed)
PCP - Dr. Billey Gosling Cardiologist - Dr. Daneen Schick  Chest x-ray - 09/01/19 EKG - 02/02/20 Stress Test - denies ECHO - 01/21/20 Cardiac Cath - denies  Sleep Study - denies CPAP - denies  Blood Thinner Instructions:N/A Aspirin Instructions:Continue  COVID TEST- 04/09/20; pt aware of quarantine   Anesthesia review: Yes, previous note on 03/16/20 by Myra Gianotti, PA-C. Will send over for update.   Patient denies shortness of breath, fever, cough and chest pain at PAT appointment   All instructions explained to the patient, with a verbal understanding of the material. Patient agrees to go over the instructions while at home for a better understanding. Patient also instructed to self quarantine after being tested for COVID-19. The opportunity to ask questions was provided.

## 2020-04-09 ENCOUNTER — Other Ambulatory Visit (HOSPITAL_COMMUNITY)
Admission: RE | Admit: 2020-04-09 | Discharge: 2020-04-09 | Disposition: A | Discharge status: Home / Self Care | Payer: Medicare HMO | Source: Ambulatory Visit | Attending: Vascular Surgery | Admitting: Vascular Surgery

## 2020-04-09 DIAGNOSIS — Z20822 Contact with and (suspected) exposure to covid-19: Secondary | ICD-10-CM | POA: Insufficient documentation

## 2020-04-09 DIAGNOSIS — Z01812 Encounter for preprocedural laboratory examination: Secondary | ICD-10-CM | POA: Insufficient documentation

## 2020-04-09 LAB — SARS CORONAVIRUS 2 (TAT 6-24 HRS): SARS Coronavirus 2: NEGATIVE

## 2020-04-10 NOTE — Anesthesia Preprocedure Evaluation (Addendum)
Anesthesia Evaluation  Patient identified by MRN, date of birth, ID band Patient awake    Reviewed: Allergy & Precautions, NPO status , Patient's Chart, lab work & pertinent test results  History of Anesthesia Complications Negative for: history of anesthetic complications  Airway Mallampati: II  TM Distance: >3 FB Neck ROM: Full    Dental  (+) Caps, Dental Advisory Given, Chipped   Pulmonary former smoker,  04/09/2020 SARS coronavirus NEG   breath sounds clear to auscultation       Cardiovascular hypertension, Pt. on medications (-) angina+ Peripheral Vascular Disease   Rhythm:Regular Rate:Normal  01/2020 ECHO: EF 65-70%, mild LVH, grade 1 DD, no significant valvular abnormalities   Neuro/Psych TIA   GI/Hepatic Neg liver ROS, GERD  Controlled,  Endo/Other  negative endocrine ROS  Renal/GU negative Renal ROS     Musculoskeletal   Abdominal   Peds  Hematology negative hematology ROS (+)   Anesthesia Other Findings   Reproductive/Obstetrics                            Anesthesia Physical Anesthesia Plan  ASA: III  Anesthesia Plan: General   Post-op Pain Management:    Induction: Intravenous  PONV Risk Score and Plan: Ondansetron and Dexamethasone  Airway Management Planned: Oral ETT  Additional Equipment: Arterial line  Intra-op Plan:   Post-operative Plan: Extubation in OR  Informed Consent: I have reviewed the patients History and Physical, chart, labs and discussed the procedure including the risks, benefits and alternatives for the proposed anesthesia with the patient or authorized representative who has indicated his/her understanding and acceptance.     Dental advisory given  Plan Discussed with: CRNA and Surgeon  Anesthesia Plan Comments:        Anesthesia Quick Evaluation

## 2020-04-11 ENCOUNTER — Other Ambulatory Visit: Payer: Self-pay

## 2020-04-11 ENCOUNTER — Encounter: Payer: Self-pay | Admitting: Internal Medicine

## 2020-04-11 ENCOUNTER — Inpatient Hospital Stay (HOSPITAL_COMMUNITY): Payer: Medicare HMO | Admitting: Physician Assistant

## 2020-04-11 ENCOUNTER — Inpatient Hospital Stay (HOSPITAL_COMMUNITY): Payer: Medicare HMO | Admitting: Certified Registered Nurse Anesthetist

## 2020-04-11 ENCOUNTER — Inpatient Hospital Stay (HOSPITAL_COMMUNITY)
Admission: RE | Admit: 2020-04-11 | Discharge: 2020-04-12 | DRG: 038 | Disposition: A | Discharge status: Home / Self Care | Payer: Medicare HMO | Attending: Vascular Surgery | Admitting: Vascular Surgery

## 2020-04-11 ENCOUNTER — Encounter (HOSPITAL_COMMUNITY)
Admission: RE | Disposition: A | Discharge status: Home / Self Care | Payer: Self-pay | Source: Home / Self Care | Attending: Vascular Surgery

## 2020-04-11 ENCOUNTER — Encounter (HOSPITAL_COMMUNITY): Payer: Self-pay | Admitting: Vascular Surgery

## 2020-04-11 DIAGNOSIS — Z7982 Long term (current) use of aspirin: Secondary | ICD-10-CM | POA: Diagnosis not present

## 2020-04-11 DIAGNOSIS — Z888 Allergy status to other drugs, medicaments and biological substances status: Secondary | ICD-10-CM | POA: Diagnosis not present

## 2020-04-11 DIAGNOSIS — Z79899 Other long term (current) drug therapy: Secondary | ICD-10-CM

## 2020-04-11 DIAGNOSIS — N401 Enlarged prostate with lower urinary tract symptoms: Secondary | ICD-10-CM | POA: Diagnosis present

## 2020-04-11 DIAGNOSIS — Z8249 Family history of ischemic heart disease and other diseases of the circulatory system: Secondary | ICD-10-CM | POA: Diagnosis not present

## 2020-04-11 DIAGNOSIS — Z87891 Personal history of nicotine dependence: Secondary | ICD-10-CM | POA: Diagnosis not present

## 2020-04-11 DIAGNOSIS — E785 Hyperlipidemia, unspecified: Secondary | ICD-10-CM | POA: Diagnosis present

## 2020-04-11 DIAGNOSIS — R7303 Prediabetes: Secondary | ICD-10-CM | POA: Diagnosis not present

## 2020-04-11 DIAGNOSIS — R001 Bradycardia, unspecified: Secondary | ICD-10-CM | POA: Diagnosis present

## 2020-04-11 DIAGNOSIS — I6521 Occlusion and stenosis of right carotid artery: Secondary | ICD-10-CM | POA: Diagnosis not present

## 2020-04-11 DIAGNOSIS — J45909 Unspecified asthma, uncomplicated: Secondary | ICD-10-CM | POA: Diagnosis present

## 2020-04-11 DIAGNOSIS — N138 Other obstructive and reflux uropathy: Secondary | ICD-10-CM | POA: Diagnosis not present

## 2020-04-11 DIAGNOSIS — D62 Acute posthemorrhagic anemia: Secondary | ICD-10-CM | POA: Diagnosis not present

## 2020-04-11 DIAGNOSIS — K219 Gastro-esophageal reflux disease without esophagitis: Secondary | ICD-10-CM | POA: Diagnosis present

## 2020-04-11 DIAGNOSIS — I10 Essential (primary) hypertension: Secondary | ICD-10-CM | POA: Diagnosis not present

## 2020-04-11 DIAGNOSIS — Z9889 Other specified postprocedural states: Secondary | ICD-10-CM | POA: Insufficient documentation

## 2020-04-11 DIAGNOSIS — Z881 Allergy status to other antibiotic agents status: Secondary | ICD-10-CM

## 2020-04-11 DIAGNOSIS — Z20822 Contact with and (suspected) exposure to covid-19: Secondary | ICD-10-CM | POA: Diagnosis present

## 2020-04-11 HISTORY — PX: ENDARTERECTOMY: SHX5162

## 2020-04-11 LAB — POCT ACTIVATED CLOTTING TIME: Activated Clotting Time: 327 seconds

## 2020-04-11 SURGERY — ENDARTERECTOMY, CAROTID
Anesthesia: General | Site: Neck | Laterality: Right

## 2020-04-11 MED ORDER — CHLORHEXIDINE GLUCONATE 0.12 % MT SOLN
15.0000 mL | Freq: Once | OROMUCOSAL | Status: AC
  Administered 2020-04-11: 15 mL via OROMUCOSAL
  Filled 2020-04-11: qty 15

## 2020-04-11 MED ORDER — PROPOFOL 10 MG/ML IV BOLUS
INTRAVENOUS | Status: DC | PRN
  Administered 2020-04-11: 100 mg via INTRAVENOUS

## 2020-04-11 MED ORDER — SODIUM CHLORIDE 0.9 % IV SOLN
INTRAVENOUS | Status: AC
  Filled 2020-04-11: qty 1.2

## 2020-04-11 MED ORDER — LIDOCAINE HCL (PF) 1 % IJ SOLN
INTRAMUSCULAR | Status: AC
  Filled 2020-04-11: qty 30

## 2020-04-11 MED ORDER — SODIUM CHLORIDE 0.9 % IV SOLN
INTRAVENOUS | Status: DC | PRN
  Administered 2020-04-11: 500 mL

## 2020-04-11 MED ORDER — OXYCODONE-ACETAMINOPHEN 5-325 MG PO TABS: 1.0000 | ORAL_TABLET | ORAL | Status: DC | PRN

## 2020-04-11 MED ORDER — FENTANYL CITRATE (PF) 250 MCG/5ML IJ SOLN
INTRAMUSCULAR | Status: DC | PRN
  Administered 2020-04-11: 150 ug via INTRAVENOUS

## 2020-04-11 MED ORDER — ROCURONIUM BROMIDE 10 MG/ML (PF) SYRINGE
PREFILLED_SYRINGE | INTRAVENOUS | Status: DC | PRN
  Administered 2020-04-11: 60 mg via INTRAVENOUS
  Administered 2020-04-11: 30 mg via INTRAVENOUS

## 2020-04-11 MED ORDER — PANTOPRAZOLE SODIUM 40 MG PO TBEC
40.0000 mg | DELAYED_RELEASE_TABLET | Freq: Every day | ORAL | Status: DC
  Filled 2020-04-11: qty 1

## 2020-04-11 MED ORDER — HYDRALAZINE HCL 20 MG/ML IJ SOLN: 5.0000 mg | INTRAMUSCULAR | Status: DC | PRN

## 2020-04-11 MED ORDER — SODIUM CHLORIDE 0.9 % IV SOLN: 250.0000 mL | INTRAVENOUS | Status: DC | PRN

## 2020-04-11 MED ORDER — PROPOFOL 10 MG/ML IV BOLUS
INTRAVENOUS | Status: AC
  Filled 2020-04-11: qty 20

## 2020-04-11 MED ORDER — FENTANYL CITRATE (PF) 100 MCG/2ML IJ SOLN: 25.0000 ug | INTRAMUSCULAR | Status: DC | PRN

## 2020-04-11 MED ORDER — CHLORHEXIDINE GLUCONATE CLOTH 2 % EX PADS: 6.0000 | MEDICATED_PAD | Freq: Once | CUTANEOUS | Status: DC

## 2020-04-11 MED ORDER — ORAL CARE MOUTH RINSE: 15.0000 mL | Freq: Once | OROMUCOSAL | Status: AC

## 2020-04-11 MED ORDER — GUAIFENESIN-DM 100-10 MG/5ML PO SYRP
15.0000 mL | ORAL_SOLUTION | ORAL | Status: DC | PRN
Start: 2020-04-11 — End: 2020-04-12

## 2020-04-11 MED ORDER — ALUM & MAG HYDROXIDE-SIMETH 200-200-20 MG/5ML PO SUSP: 15.0000 mL | ORAL | Status: DC | PRN

## 2020-04-11 MED ORDER — VANCOMYCIN HCL IN DEXTROSE 1-5 GM/200ML-% IV SOLN
1000.0000 mg | INTRAVENOUS | Status: AC
  Administered 2020-04-11: 1000 mg via INTRAVENOUS
  Filled 2020-04-11: qty 200

## 2020-04-11 MED ORDER — ACETAMINOPHEN 500 MG PO TABS: 1000.0000 mg | ORAL_TABLET | Freq: Once | ORAL | Status: AC

## 2020-04-11 MED ORDER — LIDOCAINE 2% (20 MG/ML) 5 ML SYRINGE
INTRAMUSCULAR | Status: DC | PRN
  Administered 2020-04-11: 20 mg via INTRAVENOUS

## 2020-04-11 MED ORDER — HEMOSTATIC AGENTS (NO CHARGE) OPTIME
TOPICAL | Status: DC | PRN
  Administered 2020-04-11: 1 via TOPICAL

## 2020-04-11 MED ORDER — ATORVASTATIN CALCIUM 40 MG PO TABS
40.0000 mg | ORAL_TABLET | Freq: Every day | ORAL | Status: DC
Start: 2020-04-12 — End: 2020-04-12
  Filled 2020-04-11: qty 1

## 2020-04-11 MED ORDER — ALBUMIN HUMAN 5 % IV SOLN: 12.5000 g | Freq: Once | INTRAVENOUS | Status: AC

## 2020-04-11 MED ORDER — MORPHINE SULFATE (PF) 2 MG/ML IV SOLN
2.0000 mg | INTRAVENOUS | Status: DC | PRN
Start: 2020-04-11 — End: 2020-04-12

## 2020-04-11 MED ORDER — MIDAZOLAM HCL 2 MG/2ML IJ SOLN
INTRAMUSCULAR | Status: AC
  Filled 2020-04-11: qty 2

## 2020-04-11 MED ORDER — ONDANSETRON HCL 4 MG/2ML IJ SOLN
INTRAMUSCULAR | Status: DC | PRN
  Administered 2020-04-11: 4 mg via INTRAVENOUS

## 2020-04-11 MED ORDER — MEPERIDINE HCL 25 MG/ML IJ SOLN: 6.2500 mg | INTRAMUSCULAR | Status: DC | PRN

## 2020-04-11 MED ORDER — HYDROCHLOROTHIAZIDE 25 MG PO TABS
25.0000 mg | ORAL_TABLET | Freq: Every day | ORAL | Status: DC
  Filled 2020-04-11: qty 1

## 2020-04-11 MED ORDER — DEXAMETHASONE SODIUM PHOSPHATE 10 MG/ML IJ SOLN
INTRAMUSCULAR | Status: AC
  Filled 2020-04-11: qty 1

## 2020-04-11 MED ORDER — SODIUM CHLORIDE 0.9% FLUSH: 3.0000 mL | INTRAVENOUS | Status: DC | PRN

## 2020-04-11 MED ORDER — OXYCODONE HCL 5 MG/5ML PO SOLN: 5.0000 mg | Freq: Once | ORAL | Status: DC | PRN

## 2020-04-11 MED ORDER — DEXAMETHASONE SODIUM PHOSPHATE 10 MG/ML IJ SOLN
INTRAMUSCULAR | Status: DC | PRN
  Administered 2020-04-11: 5 mg via INTRAVENOUS

## 2020-04-11 MED ORDER — MIDAZOLAM HCL 2 MG/2ML IJ SOLN: 0.5000 mg | Freq: Once | INTRAMUSCULAR | Status: DC | PRN

## 2020-04-11 MED ORDER — PHENYLEPHRINE HCL-NACL 10-0.9 MG/250ML-% IV SOLN
INTRAVENOUS | Status: DC | PRN
  Administered 2020-04-11: 25 ug/min via INTRAVENOUS

## 2020-04-11 MED ORDER — SODIUM CHLORIDE 0.9 % IV SOLN: INTRAVENOUS | Status: DC

## 2020-04-11 MED ORDER — ONDANSETRON HCL 4 MG/2ML IJ SOLN
INTRAMUSCULAR | Status: AC
  Filled 2020-04-11: qty 2

## 2020-04-11 MED ORDER — PHENYLEPHRINE 40 MCG/ML (10ML) SYRINGE FOR IV PUSH (FOR BLOOD PRESSURE SUPPORT)
PREFILLED_SYRINGE | INTRAVENOUS | Status: DC | PRN
  Administered 2020-04-11 (×2): 120 ug via INTRAVENOUS

## 2020-04-11 MED ORDER — TELMISARTAN-HCTZ 80-25 MG PO TABS: 1.0000 | ORAL_TABLET | Freq: Every day | ORAL | Status: DC

## 2020-04-11 MED ORDER — LIDOCAINE 2% (20 MG/ML) 5 ML SYRINGE
INTRAMUSCULAR | Status: AC
  Filled 2020-04-11: qty 5

## 2020-04-11 MED ORDER — IRBESARTAN 300 MG PO TABS
300.0000 mg | ORAL_TABLET | Freq: Every day | ORAL | Status: DC
  Filled 2020-04-11: qty 1

## 2020-04-11 MED ORDER — ACETAMINOPHEN 500 MG PO TABS
ORAL_TABLET | ORAL | Status: AC
  Administered 2020-04-11: 1000 mg via ORAL
  Filled 2020-04-11: qty 2

## 2020-04-11 MED ORDER — OXYCODONE HCL 5 MG PO TABS: 5.0000 mg | ORAL_TABLET | Freq: Once | ORAL | Status: DC | PRN

## 2020-04-11 MED ORDER — PHENYLEPHRINE 40 MCG/ML (10ML) SYRINGE FOR IV PUSH (FOR BLOOD PRESSURE SUPPORT)
PREFILLED_SYRINGE | INTRAVENOUS | Status: AC
  Filled 2020-04-11: qty 10

## 2020-04-11 MED ORDER — SODIUM CHLORIDE 0.9 % IV SOLN
0.0125 ug/kg/min | INTRAVENOUS | Status: AC
  Administered 2020-04-11: .05 ug/kg/min via INTRAVENOUS
  Filled 2020-04-11 (×2): qty 1000

## 2020-04-11 MED ORDER — LACTATED RINGERS IV SOLN: INTRAVENOUS | Status: DC

## 2020-04-11 MED ORDER — SUGAMMADEX SODIUM 200 MG/2ML IV SOLN
INTRAVENOUS | Status: DC | PRN
  Administered 2020-04-11: 200 mg via INTRAVENOUS

## 2020-04-11 MED ORDER — ACETAMINOPHEN 650 MG RE SUPP
325.0000 mg | RECTAL | Status: DC | PRN
Start: 2020-04-11 — End: 2020-04-12

## 2020-04-11 MED ORDER — POTASSIUM CHLORIDE CRYS ER 20 MEQ PO TBCR
20.0000 meq | EXTENDED_RELEASE_TABLET | Freq: Every day | ORAL | Status: DC | PRN
Start: 2020-04-11 — End: 2020-04-12

## 2020-04-11 MED ORDER — METOPROLOL TARTRATE 5 MG/5ML IV SOLN: 2.0000 mg | INTRAVENOUS | Status: DC | PRN

## 2020-04-11 MED ORDER — POLYETHYLENE GLYCOL 3350 17 G PO PACK: 17.0000 g | PACK | Freq: Every day | ORAL | Status: DC | PRN

## 2020-04-11 MED ORDER — ROCURONIUM BROMIDE 10 MG/ML (PF) SYRINGE
PREFILLED_SYRINGE | INTRAVENOUS | Status: AC
  Filled 2020-04-11: qty 10

## 2020-04-11 MED ORDER — HEPARIN SODIUM (PORCINE) 1000 UNIT/ML IJ SOLN
INTRAMUSCULAR | Status: DC | PRN
  Administered 2020-04-11: 10000 [IU] via INTRAVENOUS

## 2020-04-11 MED ORDER — SODIUM CHLORIDE 0.9% FLUSH
3.0000 mL | Freq: Two times a day (BID) | INTRAVENOUS | Status: DC
  Administered 2020-04-11: 3 mL via INTRAVENOUS

## 2020-04-11 MED ORDER — BISACODYL 5 MG PO TBEC: 5.0000 mg | DELAYED_RELEASE_TABLET | Freq: Every day | ORAL | Status: DC | PRN

## 2020-04-11 MED ORDER — PROTAMINE SULFATE 10 MG/ML IV SOLN
INTRAVENOUS | Status: DC | PRN
  Administered 2020-04-11: 50 mg via INTRAVENOUS

## 2020-04-11 MED ORDER — ASPIRIN EC 325 MG PO TBEC
325.0000 mg | DELAYED_RELEASE_TABLET | Freq: Every day | ORAL | Status: DC
  Filled 2020-04-11: qty 1

## 2020-04-11 MED ORDER — ONDANSETRON HCL 4 MG/2ML IJ SOLN: 4.0000 mg | Freq: Four times a day (QID) | INTRAMUSCULAR | Status: DC | PRN

## 2020-04-11 MED ORDER — 0.9 % SODIUM CHLORIDE (POUR BTL) OPTIME
TOPICAL | Status: DC | PRN
  Administered 2020-04-11: 2000 mL

## 2020-04-11 MED ORDER — ALBUMIN HUMAN 5 % IV SOLN
INTRAVENOUS | Status: AC
  Administered 2020-04-11: 12.5 g via INTRAVENOUS
  Filled 2020-04-11: qty 250

## 2020-04-11 MED ORDER — DOCUSATE SODIUM 100 MG PO CAPS
100.0000 mg | ORAL_CAPSULE | Freq: Every day | ORAL | Status: DC
  Filled 2020-04-11: qty 1

## 2020-04-11 MED ORDER — MAGNESIUM SULFATE 2 GM/50ML IV SOLN: 2.0000 g | Freq: Every day | INTRAVENOUS | Status: DC | PRN

## 2020-04-11 MED ORDER — GLYCOPYRROLATE PF 0.2 MG/ML IJ SOSY
PREFILLED_SYRINGE | INTRAMUSCULAR | Status: AC
  Filled 2020-04-11: qty 1

## 2020-04-11 MED ORDER — LABETALOL HCL 5 MG/ML IV SOLN: 10.0000 mg | INTRAVENOUS | Status: DC | PRN

## 2020-04-11 MED ORDER — ACETAMINOPHEN 325 MG PO TABS
325.0000 mg | ORAL_TABLET | ORAL | Status: DC | PRN
  Administered 2020-04-11 (×2): 650 mg via ORAL
  Filled 2020-04-11 (×2): qty 2

## 2020-04-11 MED ORDER — GLYCOPYRROLATE PF 0.2 MG/ML IJ SOSY
PREFILLED_SYRINGE | INTRAMUSCULAR | Status: DC | PRN
  Administered 2020-04-11 (×2): .2 mg via INTRAVENOUS

## 2020-04-11 MED ORDER — SODIUM CHLORIDE 0.9 % IV SOLN: 500.0000 mL | Freq: Once | INTRAVENOUS | Status: DC | PRN

## 2020-04-11 MED ORDER — PROMETHAZINE HCL 25 MG/ML IJ SOLN
6.2500 mg | INTRAMUSCULAR | Status: DC | PRN
Start: 2020-04-11 — End: 2020-04-11

## 2020-04-11 MED ORDER — FENTANYL CITRATE (PF) 250 MCG/5ML IJ SOLN
INTRAMUSCULAR | Status: AC
  Filled 2020-04-11: qty 5

## 2020-04-11 MED ORDER — MIDAZOLAM HCL 2 MG/2ML IJ SOLN
INTRAMUSCULAR | Status: DC | PRN
  Administered 2020-04-11: 2 mg via INTRAVENOUS

## 2020-04-11 MED ORDER — PHENOL 1.4 % MT LIQD: 1.0000 | OROMUCOSAL | Status: DC | PRN

## 2020-04-11 SURGICAL SUPPLY — 43 items
ADH SKN CLS APL DERMABOND .7 (GAUZE/BANDAGES/DRESSINGS) ×1
AGENT HMST SPONGE THK3/8 (HEMOSTASIS) ×1
CANISTER SUCT 3000ML PPV (MISCELLANEOUS) ×2 IMPLANT
CANNULA VESSEL 3MM 2 BLNT TIP (CANNULA) ×4 IMPLANT
CATH ROBINSON RED A/P 18FR (CATHETERS) ×2 IMPLANT
CLIP VESOCCLUDE MED 6/CT (CLIP) ×2 IMPLANT
CLIP VESOCCLUDE SM WIDE 6/CT (CLIP) ×2 IMPLANT
COVER WAND RF STERILE (DRAPES) IMPLANT
DECANTER SPIKE VIAL GLASS SM (MISCELLANEOUS) IMPLANT
DERMABOND ADVANCED (GAUZE/BANDAGES/DRESSINGS) ×1
DERMABOND ADVANCED .7 DNX12 (GAUZE/BANDAGES/DRESSINGS) ×1 IMPLANT
DRAIN HEMOVAC 1/8 X 5 (WOUND CARE) IMPLANT
ELECT REM PT RETURN 9FT ADLT (ELECTROSURGICAL) ×2
ELECTRODE REM PT RTRN 9FT ADLT (ELECTROSURGICAL) ×1 IMPLANT
EVACUATOR SILICONE 100CC (DRAIN) IMPLANT
GLOVE BIO SURGEON STRL SZ7.5 (GLOVE) ×4 IMPLANT
GLOVE SRG 8 PF TXTR STRL LF DI (GLOVE) ×1 IMPLANT
GLOVE SURG UNDER POLY LF SZ6.5 (GLOVE) ×2 IMPLANT
GLOVE SURG UNDER POLY LF SZ8 (GLOVE) ×2
GOWN STRL REUS W/ TWL LRG LVL3 (GOWN DISPOSABLE) ×3 IMPLANT
GOWN STRL REUS W/TWL LRG LVL3 (GOWN DISPOSABLE) ×6
HEMOSTAT SPONGE AVITENE ULTRA (HEMOSTASIS) ×2 IMPLANT
KIT BASIN OR (CUSTOM PROCEDURE TRAY) ×2 IMPLANT
KIT SHUNT ARGYLE CAROTID ART 6 (VASCULAR PRODUCTS) ×2 IMPLANT
KIT TURNOVER KIT B (KITS) ×2 IMPLANT
NEEDLE HYPO 25GX1X1/2 BEV (NEEDLE) IMPLANT
NS IRRIG 1000ML POUR BTL (IV SOLUTION) ×4 IMPLANT
PACK CAROTID (CUSTOM PROCEDURE TRAY) ×2 IMPLANT
PAD ARMBOARD 7.5X6 YLW CONV (MISCELLANEOUS) ×4 IMPLANT
PATCH HEMASHIELD 8X75 (Vascular Products) ×2 IMPLANT
POSITIONER HEAD DONUT 9IN (MISCELLANEOUS) ×2 IMPLANT
SHUNT CAROTID BYPASS 10 (VASCULAR PRODUCTS) IMPLANT
SHUNT CAROTID BYPASS 12FRX15.5 (VASCULAR PRODUCTS) IMPLANT
SUT ETHILON 3 0 PS 1 (SUTURE) IMPLANT
SUT PROLENE 6 0 CC (SUTURE) ×10 IMPLANT
SUT SILK 3 0 TIES 17X18 (SUTURE)
SUT SILK 3-0 18XBRD TIE BLK (SUTURE) IMPLANT
SUT VIC AB 3-0 SH 27 (SUTURE) ×2
SUT VIC AB 3-0 SH 27X BRD (SUTURE) ×1 IMPLANT
SUT VICRYL 4-0 PS2 18IN ABS (SUTURE) ×2 IMPLANT
SYR CONTROL 10ML LL (SYRINGE) IMPLANT
TOWEL GREEN STERILE (TOWEL DISPOSABLE) ×2 IMPLANT
WATER STERILE IRR 1000ML POUR (IV SOLUTION) ×2 IMPLANT

## 2020-04-11 NOTE — Anesthesia Procedure Notes (Signed)
Arterial Line Insertion Start/End2/09/2020 7:05 AM Performed by: Alain Marion, CRNA, CRNA  Patient location: Pre-op. Preanesthetic checklist: patient identified, IV checked, site marked, risks and benefits discussed, surgical consent, monitors and equipment checked, pre-op evaluation, timeout performed and anesthesia consent Lidocaine 1% used for infiltration Right, radial was placed Catheter size: 20 G Hand hygiene performed  and maximum sterile barriers used   Attempts: 1 Procedure performed without using ultrasound guided technique. Following insertion, dressing applied and Biopatch. Post procedure assessment: normal and unchanged  Patient tolerated the procedure well with no immediate complications.

## 2020-04-11 NOTE — Anesthesia Procedure Notes (Signed)
Procedure Name: Intubation Date/Time: 04/11/2020 7:54 AM Performed by: Alain Marion, CRNA Pre-anesthesia Checklist: Patient identified, Emergency Drugs available, Suction available and Patient being monitored Patient Re-evaluated:Patient Re-evaluated prior to induction Oxygen Delivery Method: Circle System Utilized Preoxygenation: Pre-oxygenation with 100% oxygen Induction Type: IV induction Ventilation: Mask ventilation without difficulty Laryngoscope Size: Miller and 3 Grade View: Grade I Tube type: Oral Tube size: 7.5 mm Number of attempts: 1 Airway Equipment and Method: Stylet and Oral airway Placement Confirmation: ETT inserted through vocal cords under direct vision,  positive ETCO2 and breath sounds checked- equal and bilateral Secured at: 23 cm Tube secured with: Tape Dental Injury: Teeth and Oropharynx as per pre-operative assessment

## 2020-04-11 NOTE — Progress Notes (Signed)
Pt received from PACU. VSS. Neuro intact. R neck incision clean, dry and intact. Telemetry applied. Call light in reach. Will continue to monitor.  Clyde Canterbury, RN

## 2020-04-11 NOTE — Anesthesia Postprocedure Evaluation (Signed)
Anesthesia Post Note  Patient: Charles Tate  Procedure(s) Performed: Right Carotid Endarterectomy (Right Neck)     Patient location during evaluation: PACU Anesthesia Type: General Level of consciousness: awake and alert, patient cooperative and oriented Pain management: pain level controlled Vital Signs Assessment: post-procedure vital signs reviewed and stable Respiratory status: spontaneous breathing, nonlabored ventilation, respiratory function stable and patient connected to nasal cannula oxygen Cardiovascular status: blood pressure returned to baseline and stable Postop Assessment: no apparent nausea or vomiting Anesthetic complications: no   No complications documented.  Last Vitals:  Vitals:   04/11/20 1253 04/11/20 1313  BP: (!) 94/49 (!) 101/56  Pulse: (!) 43 (!) 52  Resp: 10 15  Temp:  36.5 C  SpO2: 99% 95%    Last Pain:  Vitals:   04/11/20 1313  TempSrc: Oral  PainSc:                  Anesa Fronek,E. Patriciann Becht

## 2020-04-11 NOTE — H&P (Signed)
Patient name: Charles Tate         MRN: [id number]        DOB: September 30, 1950          Sex: male  HPI: Charles Tate is a 70 y.o. male, who returns for follow-up today regarding right carotid stenosis.  He recently had an area of embolization to his right retinal artery.  He returns today after recent CT angiogram.  Previous duplex exam showed 60 to 80% stenosis right internal carotid artery.  He is currently on aspirin and a statin.  He has not had any new neurologic events since his last office visit.        Past Medical History:  Diagnosis Date  . Allergy   . Arthritis    left hip   . GERD (gastroesophageal reflux disease)   . Heart murmur    adolescent rheumatic fever - developed murmur   . Hyperlipidemia   . Hypertension   . RAD (reactive airway disease)    with RTIs  . Tubular adenoma         Past Surgical History:  Procedure Laterality Date  . COLONOSCOPY    . colonoscopy with polypectomy  2013   Dr Ferdinand Lango, [facility]  . COLONOSCOPY WITH PROPOFOL N/A 07/01/2019   Procedure: COLONOSCOPY WITH PROPOFOL;  Surgeon: Rush Landmark Telford Nab., MD;  Location: [facility];  Service: Gastroenterology;  Laterality: N/A;  . ENDOSCOPIC MUCOSAL RESECTION N/A 07/01/2019   Procedure: ENDOSCOPIC MUCOSAL RESECTION;  Surgeon: Rush Landmark Telford Nab., MD;  Location: [facility];  Service: Gastroenterology;  Laterality: N/A;  . HEMOSTASIS CLIP PLACEMENT  07/01/2019   Procedure: HEMOSTASIS CLIP PLACEMENT;  Surgeon: Irving Copas., MD;  Location: [facility];  Service: Gastroenterology;;  . POLYPECTOMY    . POLYPECTOMY  07/01/2019   Procedure: POLYPECTOMY;  Surgeon: Mansouraty, Telford Nab., MD;  Location: [facility];  Service: Gastroenterology;;  . Lia Foyer LIFTING INJECTION  07/01/2019   Procedure: SUBMUCOSAL LIFTING INJECTION;  Surgeon: Irving Copas., MD;  Location: [facility];  Service: Gastroenterology;;  . Lia Foyer TATTOO INJECTION   07/01/2019   Procedure: SUBMUCOSAL TATTOO INJECTION;  Surgeon: Irving Copas., MD;  Location: [facility];  Service: Gastroenterology;;  . Arnetha Courser TOOTH EXTRACTION           Family History  Problem Relation Age of Onset  . Arthritis Mother   . Hyperlipidemia Mother   . Lupus Mother   . Heart attack Father 69  . Hypertension Sister   . Heart attack Paternal Grandmother 81  . Diabetes Paternal Aunt   . Stroke Maternal Grandfather 58  . Cancer Paternal Grandfather        ? stomach; in 57s  . Colon cancer Paternal Grandfather   . Colon polyps Neg Hx   . Esophageal cancer Neg Hx   . Rectal cancer Neg Hx   . Stomach cancer Neg Hx   . Inflammatory bowel disease Neg Hx   . Liver disease Neg Hx   . Pancreatic cancer Neg Hx     SOCIAL HISTORY: Social History        Socioeconomic History  . Marital status: Married    Spouse name: [name]  . Number of children: 2  . Years of education: Not on file  . Highest education level: Not on file  Occupational History  . Occupation: self employed   Tobacco Use  . Smoking status: Former Research scientist (life sciences)  . Smokeless tobacco: Never Used  . Tobacco comment: intermittent, short term  smoker as a teen. Some second hand smoke in 55s & 30s  Vaping Use  . Vaping Use: Never used  Substance and Sexual Activity  . Alcohol use: Yes    Comment:   very rarely  . Drug use: No  . Sexual activity: Not on file  Other Topics Concern  . Not on file  Social History Narrative   Exercise: none regularly   Social Determinants of Health   Financial Resource Strain: Not on file  Food Insecurity: Not on file  Transportation Needs: Not on file  Physical Activity: Not on file  Stress: Not on file  Social Connections: Not on file  Intimate Partner Violence: Not on file         Allergies  Allergen Reactions  . Ciprofloxacin Hcl     Numbness, joint pain  . Cefdinir Other (See Comments)    Fever, chills, stomach  cramps and diarrhea  . Lisinopril Cough          Current Outpatient Medications  Medication Sig Dispense Refill  . aspirin EC 325 MG tablet Take 1 tablet (325 mg total) by mouth daily. 30 tablet 0  . atorvastatin (LIPITOR) 40 MG tablet Take 1 tablet (40 mg total) by mouth daily. 90 tablet 3  . azelastine (ASTELIN) 0.1 % nasal spray Place 1 spray into both nostrils 2 (two) times daily. Use in each nostril as directed 30 mL 12  . fluticasone (FLONASE) 50 MCG/ACT nasal spray Place 2 sprays into both nostrils daily. 16 g 6  . Misc Natural Products (SUPER GREENS PO) Take 1 Scoop by mouth daily.     . Multiple Vitamin (MULTIVITAMIN) capsule Take 1 capsule by mouth daily.    Marland Kitchen telmisartan-hydrochlorothiazide (MICARDIS HCT) 80-25 MG tablet TAKE 1 TABLET ONCE DAILY 90 tablet 3   No current facility-administered medications for this visit.    ROS:   General:  No weight loss, Fever, chills  HEENT: No recent headaches, no nasal bleeding, no visual changes, no sore throat  Neurologic: No dizziness, blackouts, seizures. No recent symptoms of stroke or mini- stroke. No recent episodes of slurred speech, or temporary blindness.  Cardiac: No recent episodes of chest pain/pressure, no shortness of breath at rest.  No shortness of breath with exertion.  Denies history of atrial fibrillation or irregular heartbeat  Vascular: No history of rest pain in feet.  No history of claudication.  No history of non-healing ulcer, No history of DVT   Pulmonary: No home oxygen, no productive cough, no hemoptysis,  No asthma or wheezing  Musculoskeletal:  [ ]  Arthritis, [ ]  Low back pain,  [ ]  Joint pain  Hematologic:No history of hypercoagulable state.  No history of easy bleeding.  No history of anemia  Gastrointestinal: No hematochezia or melena,  No gastroesophageal reflux, no trouble swallowing  Urinary: [ ]  chronic Kidney disease, [ ]  on HD - [ ]  MWF or [ ]  TTHS, [ ]  Burning with  urination, [ ]  Frequent urination, [ ]  Difficulty urinating;   Skin: No rashes  Psychological: No history of anxiety,  No history of depression   Physical Examination   Vitals:   04/11/20 0609  BP: (!) 162/66  Pulse: (!) 55  Resp: 18  Temp: 98.3 F (36.8 C)  TempSrc: Oral  SpO2: 97%  Weight: 94.5 kg  Height: 6\' 3"  (1.905 m)    General:  Alert and oriented, no acute distress HEENT: Normal Neck: No JVD Cardiac: Regular Rate and Rhythm Extremity Pulses:  2+ radial, brachial, femoral, dorsalis pedis, posterior tibial pulses bilaterally Musculoskeletal: No deformity or edema      Neurologic: Upper and lower extremity motor 5/5 and symmetric  DATA:  Images from the patient's recent CT angiogram were reviewed today which shows a 65% stenosis with ulceration right internal carotid artery.  There is some calcification of the left internal carotid artery but no significant stenosis.  I went over and discussed all these images with the patient today.  ASSESSMENT: Symptomatic right internal carotid artery stenosis would benefit from right carotid endarterectomy.  Risk benefits possible complications procedure details including but not limited to bleeding infection stroke risk of 1% cranial nerve injury risk of 5 to 10% were discussed with patient today.  He understands and agrees to proceed.  He will call in the near future for scheduling.   PLAN: Right carotid endarterectomy patient to call to schedule   Ruta Hinds, MD [facility] [place] Office: [phone]

## 2020-04-11 NOTE — Progress Notes (Signed)
Mobility Specialist: Progress Note   04/11/20 1723  Mobility  Activity Ambulated in hall  Level of Assistance Contact guard assist, steadying assist  Assistive Device  (IV Pole)  Distance Ambulated (ft) 350 ft  Mobility Response Tolerated well  Mobility performed by Mobility specialist  Bed Position Chair  $Mobility charge 1 Mobility   Pre-Mobility: 50 HR, 109/68 BP, 97% SpO2 Post-Mobility: 46 HR, 115/54 BP, 96% SpO2  Pt asx during ambulation. Pt to chair after walk per request. RN present in room.   [facility] Whitni Pasquini Mobility Specialist Mobility Specialist Phone: [phone]

## 2020-04-11 NOTE — Op Note (Signed)
Procedure: Right carotid endarterectomy  Preoperative diagnosis: Symptomatic right internal carotid artery stenosis  Postoperative diagnosis: Same  Anesthesia: General  Assistant: Gae Gallop, MD, Risa Grill, PA-C for expediting procedure assistance with exposure and creation of anastomosis.  Operative findings: Greater than 80% stenosis friable plaque right carotid  Dacron patch  Operative details: After pain informed consent, the patient taken to the operating room.  The patient placed supine position operating table.  After induction of general anesthesia and endotracheal intubation the patient's entire right neck and chest were prepped and draped in usual sterile fashion.  An oblique incision was made on the right aspect of the neck carried on through the subcutaneous tissues and through the platysma.  Sternocleidomastoid muscle was reflected posteriorly.  Patient had a relatively high carotid bifurcation.  Common facial vein was ligated divided between silk ties.  Ansa cervicalis and its insertion of the hypoglossal nerve were identified and protected.  Vagus nerve was identified posterior to the common carotid artery and protected.  Common carotid artery was dissected free circumferentially.  There was a large ascending pharyngeal branch which was also dissected free circumferentially and a vessel loop placed around this.  The external carotid artery was dissected free circumferentially and a vessel loop placed around this.  The superior thyroid artery was dissected free circumferentially and vessel loop placed around this.  Distal internal carotid artery was dissected free just below the area of the hypoglossal nerve it was soft on palpation in this area and a vessel loop was placed around this.  Patient was then given 10,000 units of intravenous heparin ACT was greater than 300.  Distal internal carotid artery was controlled with a Kitzmiller clamp.  The external carotid artery was  controlled with Vesseloops.  Common carotid artery was controlled with a peripheral DeBakey clamp.  Longitudinal opening was made in the common carotid artery and carried up through the carotid bifurcation to the internal carotid artery.  There was a greater than 80% stenosis of the carotid bifurcation extending into the internal carotid artery with a very friable hemorrhagic appearing plaque.  Backbleeding was checked in the internal carotid artery and this was fairly vigorous and pulsatile so I did not use a shunt.  Endarterectomy was then begun in a suitable plane and a good distal endpoint was obtained.  The external carotid artery was endarterectomized by eversion technique.  A nice feathered proximal endpoint was obtained.  The dacron patch was then brought up on the operative field and sewn onto the patch angioplasty using a running 6-0 Prolene suture.  Just prior to completion anastomosis it was forward bled backbled and thoroughly flushed.  Anastomosis was secured clamps released a few repair stitches were placed.  There was good Doppler flow in the internal/external carotid and common carotid arteries at this point.  Avitene was applied and the patient was given 50 mg of protamine.  After hemostasis was obtained the platysma muscle was reapproximated using a running 3-0 Vicryl suture.  The skin was closed with a 4-0 Vicryl subcuticular stitch.  Dermabond was applied.  Patient tolerated the procedure well and there were no complications.  The instrument sponge and needle counts were correct at the end of the case.  Patient was taken to recovery room in stable condition.  Ruta Hinds, MD [facility] [place] Office: [phone]

## 2020-04-11 NOTE — Transfer of Care (Signed)
Immediate Anesthesia Transfer of Care Note  Patient: Charles Tate  Procedure(s) Performed: Right Carotid Endarterectomy (Right Neck)  Patient Location: PACU  Anesthesia Type:General  Level of Consciousness: awake, alert  and oriented  Airway & Oxygen Therapy: Patient Spontanous Breathing and Patient connected to face mask oxygen  Post-op Assessment: Report given to RN and Post -op Vital signs reviewed and stable  Post vital signs: Reviewed and stable  Last Vitals:  Vitals Value Taken Time  BP 92/49 04/11/20 1101  Temp 36.5 C 04/11/20 1046  Pulse 59 04/11/20 1102  Resp 13 04/11/20 1102  SpO2 96 % 04/11/20 1102  Vitals shown include unvalidated device data.  Last Pain:  Vitals:   04/11/20 1101  TempSrc:   PainSc: 0-No pain      Patients Stated Pain Goal: 3 (16/10/96 0454)  Complications: No complications documented.

## 2020-04-12 ENCOUNTER — Encounter (HOSPITAL_COMMUNITY): Payer: Self-pay | Admitting: Vascular Surgery

## 2020-04-12 LAB — CBC
HCT: 35 % — ABNORMAL LOW (ref 39.0–52.0)
Hemoglobin: 12.1 g/dL — ABNORMAL LOW (ref 13.0–17.0)
MCH: 30.2 pg (ref 26.0–34.0)
MCHC: 34.6 g/dL (ref 30.0–36.0)
MCV: 87.3 fL (ref 80.0–100.0)
Platelets: 163 10*3/uL (ref 150–400)
RBC: 4.01 MIL/uL — ABNORMAL LOW (ref 4.22–5.81)
RDW: 12.3 % (ref 11.5–15.5)
WBC: 8.4 10*3/uL (ref 4.0–10.5)
nRBC: 0 % (ref 0.0–0.2)

## 2020-04-12 LAB — BASIC METABOLIC PANEL
Anion gap: 10 (ref 5–15)
BUN: 19 mg/dL (ref 8–23)
CO2: 27 mmol/L (ref 22–32)
Calcium: 8.7 mg/dL — ABNORMAL LOW (ref 8.9–10.3)
Chloride: 99 mmol/L (ref 98–111)
Creatinine, Ser: 0.94 mg/dL (ref 0.61–1.24)
GFR, Estimated: >60 mL/min (ref >60)
Glucose, Bld: 130 mg/dL — ABNORMAL HIGH (ref 70–99)
Potassium: 3.6 mmol/L (ref 3.5–5.1)
Sodium: 136 mmol/L (ref 135–145)

## 2020-04-12 MED ORDER — OXYCODONE-ACETAMINOPHEN 5-325 MG PO TABS: 1.0000 | ORAL_TABLET | Freq: Four times a day (QID) | ORAL | 0 refills | Status: DC | PRN

## 2020-04-12 NOTE — Discharge Summary (Signed)
Discharge Summary     [name] 1950/10/21 70 y.o. male  [id number]  Admission Date: 04/11/2020  Discharge Date: 04/13/2020  Physician: No att. providers found  Admission Diagnosis: Carotid artery stenosis, symptomatic, right [I65.21]   HPI:   This is a 70 y.o. male who returns for follow-up today regarding right carotid stenosis. He recently had an area of embolization to his right retinal artery. He returns today after recent CT angiogram. Previous duplex exam showed 60 to 80% stenosis right internal carotid artery. He is currently on aspirin and a statin. He has not had any new neurologic events since his last office visit.   Hospital Course:  The patient was admitted to the hospital and taken to the operating room on 04/11/2020 and underwent right carotid endarterectomy.    Findings: Greater than 80% stenosis friable plaque right carotid  The pt tolerated the procedure well and was transported to the PACU in good condition.   By POD 1, the pt neuro status was in tact.  Pt had some bradycardia in the 40's-50's with soft BP.  Pt not on BB at home and not symptomatic.  Discussed with pt to hold his BP medication until systolic pressure greater than 150.  Otherwise, post op course consisted of eating without difficulty, ambulating and voiding.     Recent Labs    04/12/20 0153  NA 136  K 3.6  CL 99  CO2 27  GLUCOSE 130*  BUN 19  CALCIUM 8.7*   Recent Labs    04/12/20 0153  WBC 8.4  HGB 12.1*  HCT 35.0*  PLT 163   No results for input(s): INR in the last 72 hours.   Discharge Instructions    Discharge patient   Complete by: As directed    Discharge disposition: 01-Home or Self Care   Discharge patient date: 04/12/2020      Discharge Diagnosis:  Carotid artery stenosis, symptomatic, right [I65.21]  Secondary Diagnosis: Patient Active Problem List   Diagnosis Date Noted  . Carotid artery stenosis, symptomatic, right 04/11/2020  . S/P carotid  endarterectomy, right 04/11/2020  . Serrated adenoma of colon 03/12/2020  . Aortic atherosclerosis ([place]) 02/03/2020  . Stenosis of right carotid artery 01/26/2020  . Branch retinal artery occlusion, right eye 01/19/2020  . Posterior vitreous detachment of both eyes 01/19/2020  . 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 Tear of MCL (medial collateral ligament) of knee, left, initial encounter 08/05/2017  . Gluteal tendinitis of left buttock 08/05/2017  . Left inguinal hernia 07/04/2017  . Allergic rhinitis 12/28/2015  . H/O: rheumatic fever 12/28/2015  . Prediabetes 12/27/2015  . Rising PSA level 02/21/2014  . Personal history of colonic polyps 02/17/2014  . 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 Hyperlipidemia 01/07/2009  . Asthma 06/04/2007  . Essential hypertension 09/26/2006   Past Medical History:  Diagnosis Date  . Allergy   . Arthritis    left hip   . GERD (gastroesophageal reflux disease)    per patient, resolved. 03/15/20  . Heart murmur    adolescent rheumatic fever - developed murmur   . Hyperlipidemia   . Hypertension   . RAD (reactive airway disease)    with RTIs  . Tubular adenoma     Allergies as of 04/12/2020      Reactions   Ciprofloxacin Hcl    Numbness, joint pain   Cefdinir Other (See Comments)   Fever, chills, stomach cramps and diarrhea  Lisinopril Cough      Medication List    TAKE these medications   aspirin EC 325 MG tablet Take 1 tablet (325 mg total) by mouth daily.   atorvastatin 40 MG tablet Commonly known as: LIPITOR Take 1 tablet (40 mg total) by mouth daily.   Colostrum 500 MG Caps Take 500 mg by mouth daily.   fluticasone 50 MCG/ACT nasal spray Commonly known as: FLONASE SPRAY 2 SPRAYS INTO EACH NOSTRIL EVERY DAY What changed: See the new instructions.   Moringa 500 MG Caps Take 500 mg by mouth daily.   oxyCODONE-acetaminophen 5-325 MG tablet Commonly known as:  Percocet Take 1 tablet by mouth every 6 (six) hours as needed for severe pain. Notes to patient: **NEW** For severe pain. May cause drowsiness, dizziness and constipation. May use over-the-counter docusate, miralax or senna for constipation   SUPER GREENS PO Take 1 Scoop by mouth daily.   telmisartan-hydrochlorothiazide 80-25 MG tablet Commonly known as: MICARDIS HCT TAKE 1 TABLET ONCE DAILY What changed:   how much to take  how to take this  when to take this        [facility] Discharge Instructions Carotid Endarterectomy (CEA)  Please refer to the following instructions for your post-procedure care. Your surgeon or physician assistant will discuss any changes with you.  Activity  You are encouraged to walk as much as you can. You can slowly return to normal activities but must avoid strenuous activity and heavy lifting until your doctor tell you it's OK. Avoid activities such as vacuuming or swinging a golf club. You can drive after one week if you are comfortable and you are no longer taking prescription pain medications. It is normal to feel tired for serval weeks after your surgery. It is also normal to have difficulty with sleep habits, eating, and bowel movements after surgery. These will go away with time.  Bathing/Showering  You may shower after you come home. Do not soak in a bathtub, hot tub, or swim until the incision heals completely.  Incision Care  Shower every day. Clean your incision with mild soap and water. Pat the area dry with a clean towel. You do not need a bandage unless otherwise instructed. Do not apply any ointments or creams to your incision. You may have skin glue on your incision. Do not peel it off. It will come off on its own in about one week. Your incision may feel thickened and raised for several weeks after your surgery. This is normal and the skin will soften over time. For Men Only: It's OK to shave around the  incision but do not shave the incision itself for 2 weeks. It is common to have numbness under your chin that could last for several months.  Diet  Resume your normal diet. There are no special food restrictions following this procedure. A low fat/low cholesterol diet is recommended for all patients with vascular disease. In order to heal from your surgery, it is CRITICAL to get adequate nutrition. Your body requires vitamins, minerals, and protein. Vegetables are the best source of vitamins and minerals. Vegetables also provide the perfect balance of protein. Processed food has little nutritional value, so try to avoid this.  Medications  Resume taking all of your medications unless your doctor or physician assistant tells you not to.  If your incision is causing pain, you may take over-the- counter pain relievers such as acetaminophen (Tylenol). If you were prescribed a stronger  pain medication, please be aware these medications can cause nausea and constipation.  Prevent nausea by taking the medication with a snack or meal. Avoid constipation by drinking plenty of fluids and eating foods with a high amount of fiber, such as fruits, vegetables, and grains.  Do not take Tylenol if you are taking prescription pain medications.    Hold taking your Micardis for a couple of days until your blood pressure has increased to systolic of 696 (top number).  Follow Up  Our office will schedule a follow up appointment 2-3 weeks following discharge.  Please call us immediately for any of the following conditions  . Increased pain, redness, drainage (pus) from your incision site. . Fever of 101 degrees or higher. . If you should develop stroke (slurred speech, difficulty swallowing, weakness on one side of your body, loss of vision) you should call 911 and go to the nearest emergency room. .  Reduce your risk of vascular disease:  . Stop smoking. If you would like help call [facility] at 1-800-QUIT-NOW  812-818-2276) or [facility] at [phone]. . Manage your cholesterol . Maintain a desired weight . Control your diabetes . Keep your blood pressure down .  If you have any questions, please call the office at [phone].  Prescriptions given: 1.   Roxicet #8 No Refill  Disposition: home  Patient's condition: is Good  Follow up: 1. Dr. Oneida Alar in 3-4 weeks.   Leontine Locket, PA-C [facility] [phone]   --- For [facility] use ---   Modified Rankin score at D/C (0-6): 0  IV medication needed for:  1. Hypertension: No 2. Hypotension: No  Post-op Complications: No  1. Post-op CVA or TIA: No  If yes: Event classification (right eye, left eye, right cortical, left cortical, verterobasilar, other): n/a  If yes: Timing of event (intra-op, <6 hrs post-op, >=6 hrs post-op, unknown): n/a  2. CN injury: No  If yes: CN n/a injuried   3. Myocardial infarction: No  If yes: Dx by (EKG or clinical, Troponin): n/a  4.  CHF: No  5.  Dysrhythmia (new): No  6. Wound infection: No  7. Reperfusion symptoms: No  8. Return to OR: No  If yes: return to OR for (bleeding, neurologic, other CEA incision, other): n/a  Discharge medications: Statin use:  Yes ASA use:  Yes   Beta blocker use:  No ACE-Inhibitor use:  No  ARB use:  Yes CCB use: No P2Y12 Antagonist use: No, [ ]  Plavix, [ ]  Plasugrel, [ ]  Ticlopinine, [ ]  Ticagrelor, [ ]  Other, [ ]  No for medical reason, [ ]  Non-compliant, [ ]  Not-indicated Anti-coagulant use:  No, [ ]  Warfarin, [ ]  Rivaroxaban, [ ]  Dabigatran,

## 2020-04-12 NOTE — Progress Notes (Addendum)
  Progress Note    04/12/2020 7:17 AM 1 Day Post-Op  Subjective:  No complaints; says his throat is better today.  Walked some yesterday.  No trouble swallowing.  Afebrile HR 65'K-35'W SB 656'C systolic 12% RA  Gtts: none  Vitals:   04/11/20 2345 04/12/20 0352  BP: (!) 112/56 (!) 116/59  Pulse:  75  Resp: 19 19  Temp: 98 F (36.7 C) 97.7 F (36.5 C)  SpO2: 96% 94%     Physical Exam: Neuro:  In tact; tongue midline; moving all extremities equally Lungs:  Non labored Incision:  Clean and dry  CBC    Component Value Date/Time   WBC 8.4 04/12/2020 0153   RBC 4.01 (L) 04/12/2020 0153   HGB 12.1 (L) 04/12/2020 0153   HCT 35.0 (L) 04/12/2020 0153   PLT 163 04/12/2020 0153   MCV 87.3 04/12/2020 0153   MCH 30.2 04/12/2020 0153   MCHC 34.6 04/12/2020 0153   RDW 12.3 04/12/2020 0153   LYMPHSABS 0.7 02/03/2020 0929   MONOABS 0.5 02/03/2020 0929   EOSABS 0.2 02/03/2020 0929   BASOSABS 0.0 02/03/2020 0929    BMET    Component Value Date/Time   NA 136 04/12/2020 0153   K 3.6 04/12/2020 0153   CL 99 04/12/2020 0153   CO2 27 04/12/2020 0153   GLUCOSE 130 (H) 04/12/2020 0153   GLUCOSE 113 (H) 01/16/2006 1442   BUN 19 04/12/2020 0153   CREATININE 0.94 04/12/2020 0153   CALCIUM 8.7 (L) 04/12/2020 0153   GFRNONAA >60 04/12/2020 0153   GFRAA 129 06/28/2006 1044     Intake/Output Summary (Last 24 hours) at 04/12/2020 0717 Last data filed at 04/12/2020 0641 Gross per 24 hour  Intake 1765 ml  Output 1025 ml  Net 740 ml     Assessment/Plan:  This is a 70 y.o. male who is s/p right CEA 1 Day Post-Op  -pt is doing well this am.  bradycardia with HR in the 40's-50's mostly and BP soft.  He is not on a beta blocker at home.  HR should rebound in a day or two.  Will get him to hold his ARB for a couple of days until BP rebounds as well.  Discussed with pt and he expressed understanding.  -Acute surgical blood loss anemia but hgb at 12.1 this am.   -pt neuro exam is in  tact -pt has ambulated -pt has voided -f/u with Dr. Oneida Alar in 3-4 weeks.   Leontine Locket, PA-C [facility] [phone]  Agree with above. Pt will check his BP at home with temporary goal to stay less than 449 systolic D/c home  Ruta Hinds, MD [facility] [place] Office: [phone]

## 2020-04-12 NOTE — Discharge Instructions (Signed)
[facility]  Discharge Instructions   Carotid Surgery  Please refer to the following instructions for your post-procedure care. Your surgeon or physician assistant will discuss any changes with you.  Activity  You are encouraged to walk as much as you can. You can slowly return to normal activities but must avoid strenuous activity and heavy lifting until your doctor tell you it's okay. Avoid activities such as vacuuming or swinging a golf club. You can drive after one week if you are comfortable and you are no longer taking prescription pain medications. It is normal to feel tired for serval weeks after your surgery. It is also normal to have difficulty with sleep habits, eating, and bowel movements after surgery. These will go away with time.  Bathing/Showering  Shower daily after you go home. Do not soak in a bathtub, hot tub, or swim until the incision heals completely.  Incision Care  Shower every day. Clean your incision with mild soap and water. Pat the area dry with a clean towel. You do not need a bandage unless otherwise instructed. Do not apply any ointments or creams to your incision. You may have skin glue on your incision. Do not peel it off. It will come off on its own in about one week. Your incision may feel thickened and raised for several weeks after your surgery. This is normal and the skin will soften over time.   For Men Only: It's okay to shave around the incision but do not shave the incision itself for 2 weeks. It is common to have numbness under your chin that could last for several months.  Diet  Resume your normal diet. There are no special food restrictions following this procedure. A low fat/low cholesterol diet is recommended for all patients with vascular disease. In order to heal from your surgery, it is CRITICAL to get adequate nutrition. Your body requires vitamins, minerals, and protein. Vegetables are the best source of  vitamins and minerals. Vegetables also provide the perfect balance of protein. Processed food has little nutritional value, so try to avoid this.  Medications  Resume taking all of your medications unless your doctor or physician assistant tells you not to. If your incision is causing pain, you may take over-the- counter pain relievers such as acetaminophen (Tylenol). If you were prescribed a stronger pain medication, please be aware these medications can cause nausea and constipation. Prevent nausea by taking the medication with a snack or meal. Avoid constipation by drinking plenty of fluids and eating foods with a high amount of fiber, such as fruits, vegetables, and grains.  Do not take Tylenol if you are taking prescription pain medications.  Hold taking your Micardis for a couple of days until your blood pressure has increased to systolic of 578 (top number).  Follow Up  Our office will schedule a follow up appointment 2-3 weeks following discharge.  Please call us immediately for any of the following conditions  . Increased pain, redness, drainage (pus) from your incision site. . Fever of 101 degrees or higher. . If you should develop stroke (slurred speech, difficulty swallowing, weakness on one side of your body, loss of vision) you should call 911 and go to the nearest emergency room. .  Reduce your risk of vascular disease:  . Stop smoking. If you would like help call [facility] at 1-800-QUIT-NOW (743)695-8441) or [facility] at 209-277-0731. . Manage your cholesterol . Maintain a desired weight . Control your diabetes .  Keep your blood pressure down .  If you have any questions, please call the office at [phone].

## 2020-04-12 NOTE — Progress Notes (Signed)
Pt refused morning medications, states he will take them at home and he doesn't want to "double up" on medication on accident.

## 2020-04-13 ENCOUNTER — Telehealth: Payer: Self-pay | Admitting: Pharmacist

## 2020-04-13 NOTE — Chronic Care Management (AMB) (Signed)
Chronic Care Management Pharmacy Assistant   Name: Charles Tate  MRN: [id number] DOB: January 05, 1951  Reason for Encounter: Disease State/ Hypertension Adherence Call  Patient Questions:  1.  Have you seen any other providers since your last visit? Yes,   03/08/2020 OV Gastroenterology, Mansouraty, Telford Nab., MD - Serrated adenoma of colon, plan for colonoscopy in April 2022,  04/11/2020 Admission Vascular Surgery, Elam Dutch, MD - S/P carotid endartectomy, Pt had some bradycardia in the 40's-50's with soft BP.  Pt not on BB at home and not symptomatic.  Discussed with pt to hold his BP medication until systolic pressure greater than 150   2.  Any changes in your medicines or health? Patient states he is still currently holding his blood pressure medication until it reaches 150. Patient states it's currently at 130/70's.  PCP : [name], MD  Allergies:   Allergies  Allergen Reactions   Ciprofloxacin Hcl     Numbness, joint pain   Cefdinir Other (See Comments)    Fever, chills, stomach cramps and diarrhea   Lisinopril Cough    Medications: Outpatient Encounter Medications as of 04/13/2020  Medication Sig   aspirin EC 325 MG tablet Take 1 tablet (325 mg total) by mouth daily.   atorvastatin (LIPITOR) 40 MG tablet Take 1 tablet (40 mg total) by mouth daily.   Colostrum 500 MG CAPS Take 500 mg by mouth daily.   fluticasone (FLONASE) 50 MCG/ACT nasal spray SPRAY 2 SPRAYS INTO EACH NOSTRIL EVERY DAY (Patient taking differently: Place 1 spray into both nostrils daily.)   Misc Natural Products (SUPER GREENS PO) Take 1 Scoop by mouth daily.    Moringa 500 MG CAPS Take 500 mg by mouth daily.   oxyCODONE-acetaminophen (PERCOCET) 5-325 MG tablet Take 1 tablet by mouth every 6 (six) hours as needed for severe pain.   telmisartan-hydrochlorothiazide (MICARDIS HCT) 80-25 MG tablet TAKE 1 TABLET ONCE DAILY (Patient taking differently: Take 1 tablet by mouth daily.  TAKE 1 TABLET ONCE DAILY)   No facility-administered encounter medications on file as of 04/13/2020.    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 Spoke with patient regarding BP  Recent Office Vitals: BP Readings from Last 3 Encounters:  04/12/20 (!) 121/58  04/04/20 124/64  03/15/20 (!) 118/59   Pulse Readings from Last 3 Encounters:  04/12/20 (!) 57  04/04/20 79  03/15/20 70    Wt Readings from Last 3 Encounters:  04/11/20 208 lb 4.8 oz (94.5 kg)  04/04/20 208 lb 4.8 oz (94.5 kg)  03/15/20 205  lb 11.2 oz (93.3 kg)     Kidney Function Lab Results  Component Value Date/Time   CREATININE 0.94 04/12/2020 01:53 AM   CREATININE 0.89 04/04/2020 03:13 PM   GFR 88.30 02/03/2020 09:29 AM   GFRNONAA >60 04/12/2020 01:53 AM   GFRAA 129 06/28/2006 10:44 AM    BMP Latest Ref Rng & Units 04/12/2020 04/04/2020 03/15/2020  Glucose 70 - 99 mg/dL 130(H) 111(H) 109(H)  BUN 8 - 23 mg/dL 19 17 17    Creatinine 0.61 - 1.24 mg/dL 0.94 0.89 0.84  Sodium 135 - 145 mmol/L 136 138 137  Potassium 3.5 - 5.1 mmol/L 3.6 3.5 3.7  Chloride 98 - 111 mmol/L 99 102 101  CO2 22 - 32 mmol/L 27 26 27   Calcium 8.9 - 10.3 mg/dL 8.7(L) 9.4 9.2     Current antihypertensive regimen:  o telmisartan-hydrochlorothiazide (MICARDIS HCT) 80-25 mg tablet daily   How often are you checking your Blood Pressure? daily    Current home BP readings: 130/70's   What recent interventions/DTPs have been made by any provider to improve Blood Pressure control since last CPP Visit: Patient currently holding [place] post surgery until systolic reaches 888.   Any recent hospitalizations or ED visits since last visit with CPP? Patient had an admission for surgical procedure on 04/11/2020, he was taken to OR and underwent right carotid endarterectomy. He was discharged on the same day.   What diet changes have been made to improve Blood Pressure Control?  o Patient states he is currently following a heart healthy diet.   What exercise is being done to improve your Blood Pressure Control?  o Patient states his exercise is limited at this time since he recently had surgery. Patient states he is getting up and walking around some.  Adherence Review: Is the patient currently on ACE/ARB medication? Yes Does the patient have >5 day gap between last estimated fill dates? No   Future Appointments  Date Time Provider [place]  04/28/2020  8:20 AM Elam Dutch, MD [facility]  05/05/2020  8:15 AM [place], Clent Demark, MD [facility] None  05/26/2020  1:00 PM [facility]-S[facility]ENING [facility] None  02/15/2021  1:00 PM [facility] CCM PHARMACIST [facility] None   April D Calhoun, [place] Pharmacist Assistant [phone]   Follow-Up:  Pharmacist Review

## 2020-04-15 ENCOUNTER — Encounter: Payer: Self-pay | Admitting: Internal Medicine

## 2020-04-19 ENCOUNTER — Telehealth: Payer: Self-pay

## 2020-04-19 ENCOUNTER — Other Ambulatory Visit: Payer: Self-pay | Admitting: *Deleted

## 2020-04-19 NOTE — Telephone Encounter (Signed)
Patient called to report labile blood pressure - has been anywhere from 120's to 170's, but mostly 150s.  Advised him to call PCP for BP management. He also reports some throat soreness on the right side s/p CEA that may be due to "drainage, swelling or the tube." He is unsure if he is having sinus drainage or it's due to the carotid. Says it feels like a pencil is being pushed against his neck. I am unclear if it's getting better, sounds like the pain is better, but the drainage is worse. He also reports a knot on his incision, but that it looks fine. Denies fever. He is concerned that his next appt is not until 2/24. Discussed with PA and placed him on tomorrow's schedule for evaluation.

## 2020-04-19 NOTE — Patient Outreach (Signed)
[place] [facility] [facility]) Care Management  04/19/2020  Charles Tate [birth year] [id number]   [facility] outreach for EMMI-general discharge  RED ON EMMI ALERT Day #  4          Date: Sunday 04/17/20 [place] Reason: other questions/problems? yes   Insurance:  [employer] team advantage (HTA)   [facility] admissions x 1 ED visits x   in the last 6 months    Outreach attempt #1 [facility] Unsuccessful outreach   Outreach attempt to the home number  No answer. [facility] RN CM left HIPAA [facility] Portability and Accountability Act) compliant voicemail message along with CM's contact info.   Plan: [facility] RN CM scheduled this patient for another call attempt within 4-7 business days  Kimberly L. Lavina Hamman, RN, BSN, [place] Coordinator Office number [phone] Mobile number [phone]  Main [facility] number [phone] Fax number [phone]

## 2020-04-20 ENCOUNTER — Encounter: Payer: Self-pay | Admitting: *Deleted

## 2020-04-20 ENCOUNTER — Other Ambulatory Visit: Payer: Self-pay

## 2020-04-20 ENCOUNTER — Other Ambulatory Visit: Payer: Self-pay | Admitting: *Deleted

## 2020-04-20 ENCOUNTER — Ambulatory Visit (INDEPENDENT_AMBULATORY_CARE_PROVIDER_SITE_OTHER): Payer: Self-pay | Admitting: Physician Assistant

## 2020-04-20 VITALS — BP 124/64 | HR 100 | Temp 98.6°F | Wt 208.8 lb

## 2020-04-20 DIAGNOSIS — I6521 Occlusion and stenosis of right carotid artery: Secondary | ICD-10-CM

## 2020-04-20 NOTE — Patient Outreach (Signed)
[place] [facility]) Care Management  04/20/2020  KAYD LAUNER [birth year] [id number]  [facility] follow up outreach with case closure for EMMI referred patient  RED ON EMMI ALERT Day #  4          Date: Sunday 04/17/20 [place] Reason: other questions/problems? yes   Insurance:  Government social research officer  [facility] admissions x 1 ED visits x 1  in the last 6 months    Outreach attempt #2 successful outreach after a message was left by patient today stating he was doing very well and was following up with his vascular surgeon today to get any further questions answered  Patient is able to verify HIPAA ([place]) identifiers Reviewed and addressed the purpose of the follow up call with the patient  Consent: [facility] RN CM reviewed [facility] services with patient. Patient gave verbal consent for services.  EMMI All questions have been answered per pt He denies any further needs when assessed for medical needs, education and social determinants of health   Patient Active Problem List   Diagnosis Date Noted  . Carotid artery stenosis, symptomatic, right 04/11/2020  . S/P carotid endarterectomy, right 04/11/2020  . Serrated adenoma of colon 03/12/2020  . Aortic atherosclerosis ([place]) 02/03/2020  . Stenosis of right carotid artery 01/26/2020  . Branch retinal artery occlusion, right eye 01/19/2020  . Posterior vitreous detachment of both eyes 01/19/2020  . 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 Tear of MCL (medial collateral ligament) of knee, left, initial encounter 08/05/2017  . Gluteal tendinitis of left buttock 08/05/2017  . Left inguinal hernia 07/04/2017  . Allergic rhinitis 12/28/2015  . H/O: rheumatic fever 12/28/2015  . Prediabetes 12/27/2015  . Rising PSA level 02/21/2014  . Personal history of colonic polyps 02/17/2014  . BPH with obstruction/lower urinary tract symptoms  05/04/2010  . Hyperlipidemia 01/07/2009  . Asthma 06/04/2007  . Essential hypertension 09/26/2006   Current Outpatient Medications on File Prior to Visit  Medication Sig Dispense Refill  . aspirin EC 325 MG tablet Take 1 tablet (325 mg total) by mouth daily. 30 tablet 0  . atorvastatin (LIPITOR) 40 MG tablet Take 1 tablet (40 mg total) by mouth daily. 90 tablet 3  . Colostrum 500 MG CAPS Take 500 mg by mouth daily.    . fluticasone (FLONASE) 50 MCG/ACT nasal spray SPRAY 2 SPRAYS INTO EACH NOSTRIL EVERY DAY (Patient taking differently: Place 1 spray into both nostrils daily.) 16 mL 6  . Misc Natural Products (SUPER GREENS PO) Take 1 Scoop by mouth daily.     . Moringa 500 MG CAPS Take 500 mg by mouth daily.    Marland Kitchen oxyCODONE-acetaminophen (PERCOCET) 5-325 MG tablet Take 1 tablet by mouth every 6 (six) hours as needed for severe pain. 8 tablet 0  . telmisartan-hydrochlorothiazide (MICARDIS HCT) 80-25 MG tablet TAKE 1 TABLET ONCE DAILY (Patient taking differently: Take 1 tablet by mouth daily. TAKE 1 TABLET ONCE DAILY) 90 tablet 3   No current facility-administered medications on file prior to visit.    Plan: Patient agrees to no follow up at this time- Reports not needing any further calls  Case closure as no further identified needs Letter to patient and MD    Joelene Millin L. Lavina Hamman, RN, BSN, [place] Coordinator Office number [phone] Mobile number [phone]  Main [facility] number [phone] Fax number [phone]

## 2020-04-20 NOTE — Progress Notes (Signed)
  POST OPERATIVE OFFICE NOTE    CC:  F/u for surgery  HPI:  This is a 70 y.o. male who is s/p right carotid endarterectomy 9 days ago.  He presents today complaining of a knot at the inferior aspect of his incision.  He has had some lability in his blood pressure and pulse.  These seem to be improving over time.  He denies syncope, near syncope or difficulty swallowing.  He endorses mild hoarseness and complains of postnasal drip.  He is back on his Micardis.  He is compliant with full dose aspirin.  Allergies  Allergen Reactions  . Ciprofloxacin Hcl     Numbness, joint pain  . Cefdinir Other (See Comments)    Fever, chills, stomach cramps and diarrhea  . Lisinopril Cough    Current Outpatient Medications  Medication Sig Dispense Refill  . aspirin EC 325 MG tablet Take 1 tablet (325 mg total) by mouth daily. 30 tablet 0  . atorvastatin (LIPITOR) 40 MG tablet Take 1 tablet (40 mg total) by mouth daily. 90 tablet 3  . Colostrum 500 MG CAPS Take 500 mg by mouth daily.    . fluticasone (FLONASE) 50 MCG/ACT nasal spray SPRAY 2 SPRAYS INTO EACH NOSTRIL EVERY DAY (Patient taking differently: Place 1 spray into both nostrils daily.) 16 mL 6  . Misc Natural Products (SUPER GREENS PO) Take 1 Scoop by mouth daily.     . Moringa 500 MG CAPS Take 500 mg by mouth daily.    Marland Kitchen oxyCODONE-acetaminophen (PERCOCET) 5-325 MG tablet Take 1 tablet by mouth every 6 (six) hours as needed for severe pain. 8 tablet 0  . telmisartan-hydrochlorothiazide (MICARDIS HCT) 80-25 MG tablet TAKE 1 TABLET ONCE DAILY (Patient taking differently: Take 1 tablet by mouth daily. TAKE 1 TABLET ONCE DAILY) 90 tablet 3   No current facility-administered medications for this visit.     ROS:  See HPI  BP 124/64 (BP Location: Left Arm)   Pulse 100   Temp 98.6 F (37 C) (Skin)   Wt 208 lb 12.8 oz (94.7 kg)   SpO2 97%   BMI 26.10 kg/m   Physical Exam:  General appearance: WD, WN in  NAd Cardiac:RRR Respiratory:non-labored Incision:  Healing well without signs of infection or hematoma. Extremities: AROM without weakness or numbness. 5/5 grip strength Neuro: A and O times 4. Face symmetric   Assessment/Plan:  This is a 70 y.o. male who is s/p: Right carotid endarterectomy.  Neurologically intact.  I discussed the case with Dr. Oneida Alar as the patient is scheduled to see him next week.  He advises waiting a full 6 weeks from day of surgery before returning to full-time work and prior to having colonoscopy.  Also recommended changing dose of aspirin from 325 mg daily to 81 mg daily.  Continue statin therapy and monitoring blood pressure and pulse.  Follow-up with primary care physician as instructed.  Follow-up in 9 months with carotid duplex.  Risa Grill, PA-C [facility] [phone]  Clinic MD:  Oneida Alar

## 2020-04-21 ENCOUNTER — Other Ambulatory Visit: Payer: Self-pay

## 2020-04-21 ENCOUNTER — Ambulatory Visit: Payer: Medicare HMO | Admitting: Internal Medicine

## 2020-04-21 DIAGNOSIS — I6521 Occlusion and stenosis of right carotid artery: Secondary | ICD-10-CM

## 2020-04-28 ENCOUNTER — Encounter: Payer: Medicare HMO | Admitting: Vascular Surgery

## 2020-05-05 ENCOUNTER — Other Ambulatory Visit: Payer: Self-pay

## 2020-05-05 ENCOUNTER — Ambulatory Visit (INDEPENDENT_AMBULATORY_CARE_PROVIDER_SITE_OTHER): Payer: Medicare HMO | Admitting: Ophthalmology

## 2020-05-05 ENCOUNTER — Encounter (INDEPENDENT_AMBULATORY_CARE_PROVIDER_SITE_OTHER): Payer: Self-pay | Admitting: Ophthalmology

## 2020-05-05 DIAGNOSIS — H34231 Retinal artery branch occlusion, right eye: Secondary | ICD-10-CM

## 2020-05-05 DIAGNOSIS — I6521 Occlusion and stenosis of right carotid artery: Secondary | ICD-10-CM | POA: Diagnosis not present

## 2020-05-05 NOTE — Progress Notes (Signed)
05/05/2020     CHIEF COMPLAINT Patient presents for Retina Follow Up (3 Month F/U OU//Pt denies noticeable changes to [place] OU since last visit. Pt denies ocular pain, flashes of light, or floaters OU. Pt sts, "I think my glasses need to be updated.")   HISTORY OF PRESENT ILLNESS: Charles Tate is a 70 y.o. male who presents to the clinic today for:   HPI    Retina Follow Up    Patient presents with  Other.  In right eye.  This started 3 months ago.  Severity is mild.  Duration of 3 months.  Since onset it is stable. Additional comments: 3 Month F/U OU  Pt denies noticeable changes to [place] OU since last visit. Pt denies ocular pain, flashes of light, or floaters OU. Pt sts, "I think my glasses need to be updated."       Last edited by Rockie Neighbours, [place] on 05/05/2020  8:15 AM. (History)      Referring physician: [name], MD [place],  [place] [address]  HISTORICAL INFORMATION:   Selected notes from the MEDICAL RECORD NUMBER    Lab Results  Component Value Date   HGBA1C 5.9 02/03/2020     CURRENT MEDICATIONS: No current outpatient medications on file. (Ophthalmic Drugs)   No current facility-administered medications for this visit. (Ophthalmic Drugs)   Current Outpatient Medications (Other)  Medication Sig  . aspirin EC 325 MG tablet Take 1 tablet (325 mg total) by mouth daily.  Marland Kitchen atorvastatin (LIPITOR) 40 MG tablet Take 1 tablet (40 mg total) by mouth daily.  . Colostrum 500 MG CAPS Take 500 mg by mouth daily.  . fluticasone (FLONASE) 50 MCG/ACT nasal spray SPRAY 2 SPRAYS INTO EACH NOSTRIL EVERY DAY (Patient taking differently: Place 1 spray into both nostrils daily.)  . Misc Natural Products (SUPER GREENS PO) Take 1 Scoop by mouth daily.   . [place]a 500 MG CAPS Take 500 mg by mouth daily.  Marland Kitchen oxyCODONE-acetaminophen (PERCOCET) 5-325 MG tablet Take 1 tablet by mouth every 6 (six) hours as needed for severe pain.  Marland Kitchen telmisartan-hydrochlorothiazide  (MICARDIS HCT) 80-25 MG tablet TAKE 1 TABLET ONCE DAILY (Patient taking differently: Take 1 tablet by mouth daily. TAKE 1 TABLET ONCE DAILY)   No current facility-administered medications for this visit. (Other)      REVIEW OF SYSTEMS:    ALLERGIES Allergies  Allergen Reactions  . Ciprofloxacin Hcl     Numbness, joint pain  . Cefdinir Other (See Comments)    Fever, chills, stomach cramps and diarrhea  . Lisinopril Cough    PAST MEDICAL HISTORY Past Medical History:  Diagnosis Date  . Allergy   . Arthritis    left hip   . GERD (gastroesophageal reflux disease)    per patient, resolved. 03/15/20  . Heart murmur    adolescent rheumatic fever - developed murmur   . Hyperlipidemia   . Hypertension   . RAD (reactive airway disease)    with RTIs  . Tubular adenoma    Past Surgical History:  Procedure Laterality Date  . COLONOSCOPY    . colonoscopy with polypectomy  2013   Dr Ferdinand Lango, [facility]  . COLONOSCOPY WITH PROPOFOL N/A 07/01/2019   Procedure: COLONOSCOPY WITH PROPOFOL;  Surgeon: Rush Landmark Telford Nab., MD;  Location: [facility];  Service: Gastroenterology;  Laterality: N/A;  . ENDARTERECTOMY Right 04/11/2020   Procedure: Right Carotid Endarterectomy;  Surgeon: Elam Dutch, MD;  Location: [place] [facility];  Service: Vascular;  Laterality: Right;  . ENDOSCOPIC MUCOSAL RESECTION N/A 07/01/2019   Procedure: ENDOSCOPIC MUCOSAL RESECTION;  Surgeon: Rush Landmark Telford Nab., MD;  Location: [facility];  Service: Gastroenterology;  Laterality: N/A;  . HEMOSTASIS CLIP PLACEMENT  07/01/2019   Procedure: HEMOSTASIS CLIP PLACEMENT;  Surgeon: Irving Copas., MD;  Location: [facility];  Service: Gastroenterology;;  . POLYPECTOMY    . POLYPECTOMY  07/01/2019   Procedure: POLYPECTOMY;  Surgeon: Mansouraty, Telford Nab., MD;  Location: [facility];  Service: Gastroenterology;;  . Lia Foyer LIFTING INJECTION  07/01/2019   Procedure: SUBMUCOSAL LIFTING INJECTION;  Surgeon:  Irving Copas., MD;  Location: [facility];  Service: Gastroenterology;;  . Lia Foyer TATTOO INJECTION  07/01/2019   Procedure: SUBMUCOSAL TATTOO INJECTION;  Surgeon: Irving Copas., MD;  Location: [facility];  Service: Gastroenterology;;  . Arnetha Courser TOOTH EXTRACTION      FAMILY HISTORY Family History  Problem Relation Age of Onset  . Arthritis Mother   . Hyperlipidemia Mother   . Lupus Mother   . Heart attack Father 88  . Hypertension Sister   . Heart attack Paternal Grandmother 34  . Diabetes Paternal Aunt   . Stroke Maternal Grandfather 50  . Cancer Paternal Grandfather        ? stomach; in 44s  . Colon cancer Paternal Grandfather   . Colon polyps Neg Hx   . Esophageal cancer Neg Hx   . Rectal cancer Neg Hx   . Stomach cancer Neg Hx   . Inflammatory bowel disease Neg Hx   . Liver disease Neg Hx   . Pancreatic cancer Neg Hx     SOCIAL HISTORY Social History   Tobacco Use  . Smoking status: Former Research scientist (life sciences)  . Smokeless tobacco: Never Used  . Tobacco comment: intermittent, short term smoker as a teen. Some second hand smoke in 47s & 30s  Vaping Use  . Vaping Use: Never used  Substance Use Topics  . Alcohol use: Yes    Comment:   very rarely  . Drug use: No         OPHTHALMIC EXAM: Base Eye Exam    Visual Acuity (ETDRS)      Right Left   Dist cc 20/20 20/20   Correction: Glasses       Tonometry (Tonopen, 8:15 AM)      Right Left   Pressure 15 18       Pupils      Pupils Dark Light Shape React APD   Right PERRL 5 4 Round Brisk None   Left PERRL 5 4 Round Brisk None       Visual Fields (Counting fingers)      Left Right    Full Full       Extraocular Movement      Right Left    Full Full       Neuro/Psych    Oriented x3: Yes   Mood/Affect: Normal       Dilation    Both eyes: 1.0% Mydriacyl, 2.5% Phenylephrine @ 8:18 AM        Slit Lamp and Fundus Exam    External Exam      Right Left   External Normal Normal        Slit Lamp Exam      Right Left   Lids/Lashes Normal Normal   Conjunctiva/Sclera White and quiet White and quiet   Cornea Clear Clear   Anterior Chamber Deep and quiet Deep and quiet  Iris Round and reactive Round and reactive   Lens 2+ Nuclear sclerosis 2+ Nuclear sclerosis   Anterior Vitreous Normal Normal       Fundus Exam      Right Left   Posterior Vitreous Posterior vitreous detachment Normal   Disc Normal Normal   C/D Ratio 0.2 0.2   Macula refractile calcific deposit in the  Small cilioretinal artery on the nasal aspect of the fovea.  Separate refractile deposit in the retinal small vessels inferotemporal portion of the macula this is clearly Hollenhorst plaque, calcific Normal   Vessels  No signs of complete cilio retinal vessel occlusion ,  there are refractile calcific deposits in the cilioretinal artery on the nasal aspect of the fovea.  Separate refractile deposit in the retinal small vessels inferotemporal portion of the macula this is clearly Hollenhorst plaque, calcific.  These findings of calcific Hollenhorst plaques are new after therapeutic intervention 2 weeks ago of aqueous paracentesis to soften the globe to allow for distal displacement of cilioretinal artery occlusion.  Clearly these are now refractile, reflective and calcific in nature Normal, incidental note that the inferotemporal branch retinal artery, is independent from central retinal artery OS   Periphery Normal Normal          IMAGING AND PROCEDURES  Imaging and Procedures for 05/05/20  OCT, Retina - OU - Both Eyes       Right Eye Quality was good. Scan locations included subfoveal. Central Foveal Thickness: 301. Progression has improved. Findings include abnormal foveal contour.   Left Eye Quality was good. Scan locations included subfoveal. Central Foveal Thickness: 298. Progression has been stable. Findings include normal foveal contour.   Notes Injury of the inner retinal layers continues  yet with preservation of some of the retinal architecture nasal to the fovea OD .  This region of atrophy in the papillomacular bundle temporal to the nerve is coincident and as a consequence of small cilioretinal branch artery occlusion       Color Fundus Photography Optos - OU - Both Eyes       Right Eye Progression has been stable. Disc findings include normal observations. Macula : normal observations. Vessels : normal observations. Periphery : normal observations.   Left Eye Progression has been stable. Disc findings include normal observations. Macula : normal observations. Vessels : normal observations. Periphery : normal observations.   Notes Notably the refractile Hollenhorst plaque in the nasal aspect of the fovea is seen easily on clinical examination, is not discernible or viewable on color fundus photography right eye                ASSESSMENT/PLAN:  Stenosis of right carotid artery CT scan December 2021 discovered tight lesion at the carotid bulb, possible ulcerated plaque, patient underwent resection  Branch retinal artery occlusion, right eye Findings led to subsequent coverage of tight stenotic lesion carotid bulb right side, status post endarterectomy, 04-11-2020      ICD-10-CM   1. Branch retinal artery occlusion, right eye  H34.231 OCT, Retina - OU - Both Eyes    Color Fundus Photography Optos - OU - Both Eyes  2. Stenosis of right carotid artery  I65.21     1.  No progression of branch retinal artery occlusion right eye, no consequences other than a small region of atrophy in the papillomacular bundle right eye.  No signs of neovascularization  For this the patient requires visual field baseline testing under the direction of Dr. Luz Brazen, [facility]  2.  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  No other findings in either eye and no new Hollenhorst plaques detected  Ophthalmic Meds  Ordered this visit:  No orders of the defined types were placed in this encounter.      Return in about 6 months (around 11/05/2020) for DILATE OU, COLOR FP, OCT.  There are no Patient Instructions on file for this visit.   Explained the diagnoses, plan, and follow up with the patient and they expressed understanding.  Patient expressed understanding of the importance of proper follow up care.   Clent Demark Krishiv Sandler M.D. Diseases & Surgery of the Retina and Vitreous Retina & Diabetic [place] 05/05/20     Abbreviations: M myopia (nearsighted); A astigmatism; H hyperopia (farsighted); P presbyopia; Mrx spectacle prescription;  CTL contact lenses; OD right eye; OS left eye; OU both eyes  XT exotropia; ET esotropia; PEK punctate epithelial keratitis; PEE punctate epithelial erosions; DES dry eye syndrome; MGD meibomian gland dysfunction; ATs artificial tears; PFAT's preservative free artificial tears; [place] nuclear sclerotic cataract; PSC posterior subcapsular cataract; ERM epi-retinal membrane; PVD posterior vitreous detachment; RD retinal detachment; DM diabetes mellitus; DR diabetic retinopathy; NPDR non-proliferative diabetic retinopathy; PDR proliferative diabetic retinopathy; CSME clinically significant macular edema; DME diabetic macular edema; dbh dot blot hemorrhages; CWS cotton wool spot; POAG primary open angle glaucoma; C/D cup-to-disc ratio; HVF humphrey visual field; GVF goldmann visual field; OCT optical coherence tomography; IOP intraocular pressure; BRVO Branch retinal vein occlusion; CRVO central retinal vein occlusion; CRAO central retinal artery occlusion; BRAO branch retinal artery occlusion; RT retinal tear; SB scleral buckle; PPV pars plana vitrectomy; VH Vitreous hemorrhage; PRP panretinal laser photocoagulation; IVK intravitreal kenalog; VMT vitreomacular traction; MH Macular hole;  NVD neovascularization of the disc; NVE neovascularization elsewhere; AREDS age related eye disease  study; ARMD age related macular degeneration; POAG primary open angle glaucoma; EBMD epithelial/anterior basement membrane dystrophy; ACIOL anterior chamber intraocular lens; IOL intraocular lens; PCIOL posterior chamber intraocular lens; Phaco/IOL phacoemulsification with intraocular lens placement; [place] photorefractive keratectomy; LASIK laser assisted in situ keratomileusis; HTN hypertension; DM diabetes mellitus; COPD chronic obstructive pulmonary disease

## 2020-05-05 NOTE — Assessment & Plan Note (Signed)
Findings led to subsequent coverage of tight stenotic lesion carotid bulb right side, status post endarterectomy, 04-11-2020

## 2020-05-05 NOTE — Patient Instructions (Signed)
Patient requested to follow-up with Dr. Luz Brazen, near [facility], for consideration of a peripheral vision test (visual field testing formally to document the residual deficit that the right eye has now in this complete perhaps repeated on a every 3 to 4-year basis.

## 2020-05-05 NOTE — Assessment & Plan Note (Signed)
CT scan December 2021 discovered tight lesion at the carotid bulb, possible ulcerated plaque, patient underwent resection

## 2020-05-06 ENCOUNTER — Telehealth: Payer: Self-pay | Admitting: Gastroenterology

## 2020-05-06 NOTE — Telephone Encounter (Signed)
Dr. Rush Landmark have you talked with Dr. Oneida Alar about his pt? See note below.

## 2020-05-06 NOTE — Telephone Encounter (Signed)
Inbound call from patient asking if Dr. Oneida Alar has reached out to Dr. Rush Landmark in regards to procedure scheduled for 05/30/20.  States he had surgery on 04/11/20 and his follow-up on 04/21/20 where his doctor informed him he would like for patient to wait until he has healed better.  Please advise.

## 2020-05-07 NOTE — Telephone Encounter (Signed)
I had spoken to Dr. Oneida Alar prior to the surgery about what his anticoagulation needs would be and I was OK with him to continue Aspirin and I am OK with that as well throughout the preparation and then post-procedure.    I am now reading the most recent Vascular surgery note and there is notation that they would like to wait for a full 6-weeks from his surgery date before colonoscopy attempt.    He is scheduled at 7-weeks out from his Carotid Endarterectomy.    From my perspective, I am Ok with the date that he currently has, but if he wants to postpone a few more weeks into the middle of April, I am also OK with that for further optimization and healing from his surgery.  I want him to feel comfortable about his procedure, but timing wise, things would still fit. Let me know what he decides.  Thank you. GM

## 2020-05-09 NOTE — Telephone Encounter (Signed)
The pt has been advised and wishes to proceed with procedures as planned.

## 2020-05-20 ENCOUNTER — Telehealth: Payer: Self-pay

## 2020-05-20 NOTE — Telephone Encounter (Signed)
-----   Message from Timothy Lasso, RN sent at 03/22/2020 10:45 AM EST ----- Regarding: RE: [facility] patient Revis, Thank you for update. No worries, no matter when we do his colonoscopy it is very infrequent that I end up stopping the aspirin so good to know. Jden Want, if you can let the patient know that Dr. Oneida Alar and I have discussed this case.  Once he has completed his procedure with Dr. Oneida Alar, I would anticipate trying to do his procedure 1 to 2 months after as long as he has done well.  Please also make a notation in the chart that we will continue aspirin. GM

## 2020-05-20 NOTE — Telephone Encounter (Signed)
The pt has been scheduled and instructed. All questions have answered Instructions have been sent to the pharmacy.

## 2020-05-23 NOTE — Progress Notes (Signed)
Attempted to obtain medical history via telephone, unable to reach at this time. I left a voicemail to return pre surgical testing department's phone call.  

## 2020-05-26 ENCOUNTER — Other Ambulatory Visit (HOSPITAL_COMMUNITY)
Admission: RE | Admit: 2020-05-26 | Discharge: 2020-05-26 | Disposition: A | Discharge status: Home / Self Care | Payer: Medicare HMO | Source: Ambulatory Visit | Attending: Gastroenterology | Admitting: Gastroenterology

## 2020-05-26 DIAGNOSIS — Z20822 Contact with and (suspected) exposure to covid-19: Secondary | ICD-10-CM | POA: Diagnosis not present

## 2020-05-26 DIAGNOSIS — Z01812 Encounter for preprocedural laboratory examination: Secondary | ICD-10-CM | POA: Diagnosis not present

## 2020-05-26 LAB — SARS CORONAVIRUS 2 (TAT 6-24 HRS): SARS Coronavirus 2: NEGATIVE

## 2020-05-29 NOTE — Anesthesia Preprocedure Evaluation (Addendum)
Anesthesia Evaluation  Patient identified by MRN, date of birth, ID band Patient awake    Reviewed: Allergy & Precautions, NPO status , Patient's Chart, lab work & pertinent test results  Airway Mallampati: II  TM Distance: >3 FB Neck ROM: Full    Dental no notable dental hx. (+) Chipped, Caps   Pulmonary former smoker,    Pulmonary exam normal breath sounds clear to auscultation       Cardiovascular Exercise Tolerance: Good hypertension, Pt. on medications + Peripheral Vascular Disease  Normal cardiovascular exam Rhythm:Regular Rate:Normal  01/2020 ECHO: EF 65-70%, mild LVH, grade 1 DD, no significant valvular abnormalities   Neuro/Psych negative neurological ROS  negative psych ROS   GI/Hepatic Neg liver ROS, GERD  ,  Endo/Other  negative endocrine ROS  Renal/GU negative Renal ROS     Musculoskeletal  (+) Arthritis ,   Abdominal   Peds  Hematology   Anesthesia Other Findings   Reproductive/Obstetrics                            Anesthesia Physical Anesthesia Plan  ASA: III  Anesthesia Plan: MAC   Post-op Pain Management:    Induction: Intravenous  PONV Risk Score and Plan:   Airway Management Planned: Natural Airway  Additional Equipment:   Intra-op Plan:   Post-operative Plan:   Informed Consent: I have reviewed the patients History and Physical, chart, labs and discussed the procedure including the risks, benefits and alternatives for the proposed anesthesia with the patient or authorized representative who has indicated his/her understanding and acceptance.     Dental advisory given  Plan Discussed with: CRNA  Anesthesia Plan Comments: (Hx of colon polyps for colonoscopy )       Anesthesia Quick Evaluation

## 2020-05-30 ENCOUNTER — Encounter (HOSPITAL_COMMUNITY)
Admission: RE | Disposition: A | Discharge status: Home / Self Care | Payer: Self-pay | Source: Home / Self Care | Attending: Gastroenterology

## 2020-05-30 ENCOUNTER — Other Ambulatory Visit: Payer: Self-pay

## 2020-05-30 ENCOUNTER — Ambulatory Visit (HOSPITAL_COMMUNITY): Payer: Medicare HMO | Admitting: Anesthesiology

## 2020-05-30 ENCOUNTER — Ambulatory Visit (HOSPITAL_COMMUNITY)
Admission: RE | Admit: 2020-05-30 | Discharge: 2020-05-30 | Disposition: A | Discharge status: Home / Self Care | Payer: Medicare HMO | Attending: Gastroenterology | Admitting: Gastroenterology

## 2020-05-30 ENCOUNTER — Encounter (HOSPITAL_COMMUNITY): Payer: Self-pay | Admitting: Gastroenterology

## 2020-05-30 DIAGNOSIS — Z09 Encounter for follow-up examination after completed treatment for conditions other than malignant neoplasm: Secondary | ICD-10-CM | POA: Diagnosis not present

## 2020-05-30 DIAGNOSIS — Z8489 Family history of other specified conditions: Secondary | ICD-10-CM | POA: Diagnosis not present

## 2020-05-30 DIAGNOSIS — Z86010 Personal history of colonic polyps: Secondary | ICD-10-CM

## 2020-05-30 DIAGNOSIS — Z8249 Family history of ischemic heart disease and other diseases of the circulatory system: Secondary | ICD-10-CM | POA: Insufficient documentation

## 2020-05-30 DIAGNOSIS — Z9889 Other specified postprocedural states: Secondary | ICD-10-CM | POA: Diagnosis not present

## 2020-05-30 DIAGNOSIS — Z87891 Personal history of nicotine dependence: Secondary | ICD-10-CM | POA: Diagnosis not present

## 2020-05-30 DIAGNOSIS — K644 Residual hemorrhoidal skin tags: Secondary | ICD-10-CM | POA: Diagnosis not present

## 2020-05-30 DIAGNOSIS — D123 Benign neoplasm of transverse colon: Secondary | ICD-10-CM | POA: Insufficient documentation

## 2020-05-30 DIAGNOSIS — Z823 Family history of stroke: Secondary | ICD-10-CM | POA: Insufficient documentation

## 2020-05-30 DIAGNOSIS — K641 Second degree hemorrhoids: Secondary | ICD-10-CM | POA: Insufficient documentation

## 2020-05-30 DIAGNOSIS — Z833 Family history of diabetes mellitus: Secondary | ICD-10-CM | POA: Insufficient documentation

## 2020-05-30 DIAGNOSIS — Z8349 Family history of other endocrine, nutritional and metabolic diseases: Secondary | ICD-10-CM | POA: Insufficient documentation

## 2020-05-30 DIAGNOSIS — K635 Polyp of colon: Secondary | ICD-10-CM | POA: Diagnosis not present

## 2020-05-30 DIAGNOSIS — Z8261 Family history of arthritis: Secondary | ICD-10-CM | POA: Insufficient documentation

## 2020-05-30 DIAGNOSIS — K219 Gastro-esophageal reflux disease without esophagitis: Secondary | ICD-10-CM | POA: Diagnosis not present

## 2020-05-30 DIAGNOSIS — Z881 Allergy status to other antibiotic agents status: Secondary | ICD-10-CM | POA: Diagnosis not present

## 2020-05-30 DIAGNOSIS — K5790 Diverticulosis of intestine, part unspecified, without perforation or abscess without bleeding: Secondary | ICD-10-CM | POA: Diagnosis not present

## 2020-05-30 DIAGNOSIS — Z800 Family history of malignant neoplasm of digestive organs: Secondary | ICD-10-CM | POA: Insufficient documentation

## 2020-05-30 DIAGNOSIS — K5730 Diverticulosis of large intestine without perforation or abscess without bleeding: Secondary | ICD-10-CM | POA: Diagnosis not present

## 2020-05-30 DIAGNOSIS — D122 Benign neoplasm of ascending colon: Secondary | ICD-10-CM | POA: Insufficient documentation

## 2020-05-30 DIAGNOSIS — K649 Unspecified hemorrhoids: Secondary | ICD-10-CM | POA: Diagnosis not present

## 2020-05-30 DIAGNOSIS — Z888 Allergy status to other drugs, medicaments and biological substances status: Secondary | ICD-10-CM | POA: Insufficient documentation

## 2020-05-30 HISTORY — PX: POLYPECTOMY: SHX5525

## 2020-05-30 HISTORY — PX: COLONOSCOPY WITH PROPOFOL: SHX5780

## 2020-05-30 HISTORY — PX: ENDOSCOPIC MUCOSAL RESECTION: SHX6839

## 2020-05-30 SURGERY — COLONOSCOPY WITH PROPOFOL
Anesthesia: Monitor Anesthesia Care

## 2020-05-30 MED ORDER — PROPOFOL 500 MG/50ML IV EMUL
INTRAVENOUS | Status: AC
  Filled 2020-05-30: qty 100

## 2020-05-30 MED ORDER — SODIUM CHLORIDE 0.9 % IV SOLN: INTRAVENOUS | Status: DC

## 2020-05-30 MED ORDER — PROPOFOL 10 MG/ML IV BOLUS
INTRAVENOUS | Status: DC | PRN
  Administered 2020-05-30: 30 mg via INTRAVENOUS
  Administered 2020-05-30: 20 mg via INTRAVENOUS

## 2020-05-30 MED ORDER — LACTATED RINGERS IV SOLN
INTRAVENOUS | Status: DC
  Administered 2020-05-30: 1000 mL via INTRAVENOUS

## 2020-05-30 MED ORDER — PROPOFOL 500 MG/50ML IV EMUL
INTRAVENOUS | Status: AC
  Filled 2020-05-30: qty 50

## 2020-05-30 MED ORDER — PROPOFOL 500 MG/50ML IV EMUL
INTRAVENOUS | Status: DC | PRN
  Administered 2020-05-30: 125 ug/kg/min via INTRAVENOUS

## 2020-05-30 MED ORDER — EPHEDRINE SULFATE-NACL 50-0.9 MG/10ML-% IV SOSY
PREFILLED_SYRINGE | INTRAVENOUS | Status: DC | PRN
  Administered 2020-05-30: 10 mg via INTRAVENOUS

## 2020-05-30 SURGICAL SUPPLY — 21 items

## 2020-05-30 NOTE — Discharge Instructions (Signed)
YOU HAD AN ENDOSCOPIC PROCEDURE TODAY: Refer to the procedure report and other information in the discharge instructions given to you for any specific questions about what was found during the examination. If this information does not answer your questions, please call [place] office at [phone] to clarify.  ° °YOU SHOULD EXPECT: Some feelings of bloating in the abdomen. Passage of more gas than usual. Walking can help get rid of the air that was put into your GI tract during the procedure and reduce the bloating. If you had a lower endoscopy (such as a colonoscopy or flexible sigmoidoscopy) you may notice spotting of blood in your stool or on the toilet paper. Some abdominal soreness may be present for a day or two, also. ° °DIET: Your first meal following the procedure should be a light meal and then it is ok to progress to your normal diet. A half-sandwich or bowl of soup is an example of a good first meal. Heavy or fried foods are harder to digest and may make you feel nauseous or bloated. Drink plenty of fluids but you should avoid alcoholic beverages for 24 hours. If you had a esophageal dilation, please see attached instructions for diet.   ° °ACTIVITY: Your care partner should take you home directly after the procedure. You should plan to take it easy, moving slowly for the rest of the day. You can resume normal activity the day after the procedure however YOU SHOULD NOT DRIVE, use power tools, machinery or perform tasks that involve climbing or major physical exertion for 24 hours (because of the sedation medicines used during the test).  ° °SYMPTOMS TO REPORT IMMEDIATELY: °A gastroenterologist can be reached at any hour. Please call [phone]  for any of the following symptoms:  °Following lower endoscopy (colonoscopy, flexible sigmoidoscopy) °Excessive amounts of blood in the stool  °Significant tenderness, worsening of abdominal pains  °Swelling of the abdomen that is new, acute  °Fever of 100° or  higher  °Following upper endoscopy (EGD, EUS, ERCP, esophageal dilation) °Vomiting of blood or coffee ground material  °New, significant abdominal pain  °New, significant chest pain or pain under the shoulder blades  °Painful or persistently difficult swallowing  °New shortness of breath  °Black, tarry-looking or red, bloody stools ° °FOLLOW UP:  °If any biopsies were taken you will be contacted by phone or by letter within the next 1-3 weeks. Call [phone]  if you have not heard about the biopsies in 3 weeks.  °Please also call with any specific questions about appointments or follow up tests. ° °

## 2020-05-30 NOTE — Op Note (Signed)
[facility] Patient Name: Charles Tate Procedure Date: 05/30/2020 MRN: [id number] Attending MD: Justice Britain , MD Date of Birth: 07/06/1950 CSN: [id number] Age: 70 Admit Type: Outpatient Procedure:                Colonoscopy Indications:              Surveillance: Personal history of piecemeal removal                            of large sessile adenoma on last colonoscopy (less                            than 1 year ago) Providers:                Justice Britain, MD, Jeanella Cara, RN,                            Josie Dixon, RN, Laverda Sorenson, Technician Referring MD:             [name], MD, Lajuan Lines. Pyrtle, MD Medicines:                Monitored Anesthesia Care Complications:            No immediate complications. Estimated Blood Loss:     Estimated blood loss was minimal. Procedure:                Pre-Anesthesia Assessment:                           - Prior to the procedure, a History and Physical                            was performed, and patient medications and                            allergies were reviewed. The patient's tolerance of                            previous anesthesia was also reviewed. The risks                            and benefits of the procedure and the sedation                            options and risks were discussed with the patient.                            All questions were answered, and informed consent                            was obtained. Prior Anticoagulants: The patient has                            taken no previous anticoagulant or antiplatelet  agents except for aspirin. ASA Grade Assessment: II                            - A patient with mild systemic disease. After                            reviewing the risks and benefits, the patient was                            deemed in satisfactory condition to undergo the                            procedure.                            After obtaining informed consent, the colonoscope                            was passed under direct vision. Throughout the                            procedure, the patient's blood pressure, pulse, and                            oxygen saturations were monitored continuously. The                            CF-HQ190L ([id number]) Olympus colonoscope was                            introduced through the anus and advanced to the 5                            cm into the ileum. The colonoscopy was somewhat                            difficult due to inadequate bowel prep and                            significant looping. Successful completion of the                            procedure was aided by changing the patient's                            position, using manual pressure, straightening and                            shortening the scope to obtain bowel loop reduction                            and using scope torsion. The patient tolerated the  procedure. The quality of the bowel preparation was                            adequate. The terminal ileum, ileocecal valve,                            appendiceal orifice, and rectum were photographed. Scope In: 7:55:43 AM Scope Out: 8:34:10 AM Scope Withdrawal Time: 0 hours 31 minutes 7 seconds  Total Procedure Duration: 0 hours 38 minutes 27 seconds  Findings:      The digital rectal exam findings include hemorrhoids. Pertinent       negatives include no palpable rectal lesions.      A large amount of liquid and semi-liquid stool was found in the entire       colon, interfering with visualization. Lavage of the area was performed       using copious amounts, resulting in clearance with adequate       visualization.      The terminal ileum and ileocecal valve appeared normal.      A medium post mucosectomy scar was found in the proximal ascending       colon. There was no evidence of the previous  polyp. This was proximal to       the previously placed tattoo.      Four sessile polyps were found in the transverse colon (1), hepatic       flexure (1) and ascending colon (2). The polyps were 3 to 6 mm in size.       These polyps were removed with a cold snare. Resection and retrieval       were complete.      Many small and large-mouthed diverticula were found in the entire colon.      Normal mucosa was found in the entire colon otherwise.      Non-bleeding non-thrombosed external and internal hemorrhoids were found       during retroflexion, during perianal exam and during digital exam. The       hemorrhoids were Grade II (internal hemorrhoids that prolapse but reduce       spontaneously). Impression:               - Hemorrhoids found on digital rectal exam.                           - Stool in the entire examined colon - lavaged                            copiously with adequate visualization.                           - The examined portion of the ileum was normal.                           - Post mucosectomy scar in the proximal ascending                            colon proximal to previously placed tattoo without  evidence of any recurrent serrated adenoma present.                           - Four 3 to 6 mm polyps in the transverse colon, at                            the hepatic flexure and in the ascending colon,                            removed with a cold snare. Resected and retrieved.                           - Diverticulosis in the entire examined colon.                           - Normal mucosa in the entire examined colon                            otherwise.                           - Non-bleeding non-thrombosed external and internal                            hemorrhoids. Moderate Sedation:      Not Applicable - Patient had care per Anesthesia. Recommendation:           - The patient will be observed post-procedure,                             until all discharge criteria are met.                           - Discharge patient to home.                           - Patient has a contact number available for                            emergencies. The signs and symptoms of potential                            delayed complications were discussed with the                            patient. Return to normal activities tomorrow.                            Written discharge instructions were provided to the                            patient.                           - High fiber diet.                           -  Use FiberCon 1-2 tablets PO daily.                           - Continue present medications.                           - Await pathology results.                           - Repeat colonoscopy in 3 years for surveillance                            due to history of previous large advanced serrated                            adenoma. Risk of recurrence now is low, but for                            colon polyps surveillance is important. Will have                            patient follow up with primary GI for next                            procedure (Dr. Hilarie Fredrickson).                           - The findings and recommendations were discussed                            with the patient.                           - The findings and recommendations were discussed                            with the patient's family. Procedure Code(s):        --- Professional ---                           [phone], Colonoscopy, flexible; with removal of                            tumor(s), polyp(s), or other lesion(s) by snare                            technique Diagnosis Code(s):        --- Professional ---                           K64.1, Second degree hemorrhoids                           K63.5, Polyp of colon  H21.224, Other specified postprocedural states                           Z09, Encounter for follow-up  examination after                            completed treatment for conditions other than                            malignant neoplasm                           Z86.010, Personal history of colonic polyps                           K57.30, Diverticulosis of large intestine without                            perforation or abscess without bleeding CPT copyright 2019 [employer]. All rights reserved. The codes documented in this report are preliminary and upon coder review may  be revised to meet current compliance requirements. Justice Britain, MD 05/30/2020 8:54:31 AM Number of Addenda: 0

## 2020-05-30 NOTE — Anesthesia Postprocedure Evaluation (Signed)
Anesthesia Post Note  Patient: Charles Tate  Procedure(s) Performed: COLONOSCOPY WITH PROPOFOL (N/A ) ENDOSCOPIC MUCOSAL RESECTION (N/A ) POLYPECTOMY     Patient location during evaluation: Endoscopy Anesthesia Type: MAC Level of consciousness: awake and alert Pain management: pain level controlled Vital Signs Assessment: post-procedure vital signs reviewed and stable Respiratory status: spontaneous breathing, nonlabored ventilation, respiratory function stable and patient connected to nasal cannula oxygen Cardiovascular status: blood pressure returned to baseline and stable Postop Assessment: no apparent nausea or vomiting Anesthetic complications: no   No complications documented.  Last Vitals:  Vitals:   05/30/20 0848 05/30/20 0900  BP: (!) 137/59 136/78  Pulse: 61 (!) 57  Resp: 12 12  Temp:    SpO2: 97% 96%    Last Pain:  Vitals:   05/30/20 0900  TempSrc:   PainSc: 0-No pain                 Barnet Glasgow

## 2020-05-30 NOTE — H&P (Signed)
GASTROENTEROLOGY PROCEDURE H&P NOTE   Primary Care Physician: [name], MD  HPI: Charles Tate is a 70 y.o. male who presents for Colonoscopy for follow up of prior large EMR SSP.  Past Medical History:  Diagnosis Date  . Allergy   . Arthritis    left hip   . GERD (gastroesophageal reflux disease)    per patient, resolved. 03/15/20  . Heart murmur    adolescent rheumatic fever - developed murmur   . Hyperlipidemia   . Hypertension   . RAD (reactive airway disease)    with RTIs  . Tubular adenoma    Past Surgical History:  Procedure Laterality Date  . COLONOSCOPY    . colonoscopy with polypectomy  2013   Dr Ferdinand Lango, [facility] [place]  . COLONOSCOPY WITH PROPOFOL N/A 07/01/2019   Procedure: COLONOSCOPY WITH PROPOFOL;  Surgeon: Rush Landmark Telford Nab., MD;  Location: [facility];  Service: Gastroenterology;  Laterality: N/A;  . ENDARTERECTOMY Right 04/11/2020   Procedure: Right Carotid Endarterectomy;  Surgeon: Elam Dutch, MD;  Location: [address];  Service: Vascular;  Laterality: Right;  . ENDOSCOPIC MUCOSAL RESECTION N/A 07/01/2019   Procedure: ENDOSCOPIC MUCOSAL RESECTION;  Surgeon: Rush Landmark Telford Nab., MD;  Location: [facility];  Service: Gastroenterology;  Laterality: N/A;  . HEMOSTASIS CLIP PLACEMENT  07/01/2019   Procedure: HEMOSTASIS CLIP PLACEMENT;  Surgeon: Irving Copas., MD;  Location: [facility];  Service: Gastroenterology;;  . POLYPECTOMY    . POLYPECTOMY  07/01/2019   Procedure: POLYPECTOMY;  Surgeon: Mansouraty, Telford Nab., MD;  Location: [facility];  Service: Gastroenterology;;  . Lia Foyer LIFTING INJECTION  07/01/2019   Procedure: SUBMUCOSAL LIFTING INJECTION;  Surgeon: Irving Copas., MD;  Location: [facility];  Service: Gastroenterology;;  . Lia Foyer TATTOO INJECTION  07/01/2019   Procedure: SUBMUCOSAL TATTOO INJECTION;  Surgeon: Irving Copas., MD;  Location: [facility];  Service: Gastroenterology;;  . Arnetha Courser  TOOTH EXTRACTION     Current Facility-Administered Medications  Medication Dose Route Frequency Provider Last Rate Last Admin  . 0.9 %  sodium chloride infusion   Intravenous Continuous Mansouraty, Telford Nab., MD      . lactated ringers infusion   Intravenous Continuous Mansouraty, Telford Nab., MD 10 mL/hr at 05/30/20 0707 1,000 mL at 05/30/20 0707   Allergies  Allergen Reactions  . Ciprofloxacin Hcl     Numbness, joint pain  . Cefdinir Diarrhea    Fever, chills, stomach cramps   . Lisinopril Cough   Family History  Problem Relation Age of Onset  . Arthritis Mother   . Hyperlipidemia Mother   . Lupus Mother   . Heart attack Father 75  . Hypertension Sister   . Heart attack Paternal Grandmother 64  . Diabetes Paternal Aunt   . Stroke Maternal Grandfather 75  . Cancer Paternal Grandfather        ? stomach; in 71s  . Colon cancer Paternal Grandfather   . Colon polyps Neg Hx   . Esophageal cancer Neg Hx   . Rectal cancer Neg Hx   . Stomach cancer Neg Hx   . Inflammatory bowel disease Neg Hx   . Liver disease Neg Hx   . Pancreatic cancer Neg Hx    Social History   Socioeconomic History  . Marital status: Married    Spouse name: [name]  . Number of children: 2  . Years of education: Not on file  . Highest education level: Not on file  Occupational History  . Occupation: self employed  Tobacco Use  . Smoking status: Former Research scientist (life sciences)  . Smokeless tobacco: Never Used  . Tobacco comment: intermittent, short term smoker as a teen. Some second hand smoke in 46s & 30s  Vaping Use  . Vaping Use: Never used  Substance and Sexual Activity  . Alcohol use: Yes    Comment:   very rarely  . Drug use: No  . Sexual activity: Not on file  Other Topics Concern  . Not on file  Social History Narrative   Exercise: none regularly   Social Determinants of Health   Financial Resource Strain: Low Risk   . Difficulty of Paying Living Expenses: Not hard at all  Food Insecurity: No  Food Insecurity  . Worried About Charity fundraiser in the Last Year: Never true  . Ran Out of Food in the Last Year: Never true  Transportation Needs: No Transportation Needs  . Lack of Transportation (Medical): No  . Lack of Transportation (Non-Medical): No  Physical Activity: Not on file  Stress: No Stress Concern Present  . Feeling of Stress : Not at all  Social Connections: Not on file  Intimate Partner Violence: Not At Risk  . Fear of Current or Ex-Partner: No  . Emotionally Abused: No  . Physically Abused: No  . Sexually Abused: No    Physical Exam: Vital signs in last 24 hours: Temp:  [98.3 F (36.8 C)] 98.3 F (36.8 C) (03/28 0648) Pulse Rate:  [64] 64 (03/28 0648) Resp:  [10] 10 (03/28 0648) BP: (130)/(82) 130/82 (03/28 0648) SpO2:  [96 %] 96 % (03/28 0648) Weight:  [90.7 kg] 90.7 kg (03/28 0648)   GEN: NAD EYE: Sclerae anicteric ENT: MMM CV: Non-tachycardic GI: Soft, NT/ND NEURO:  Alert & Oriented x 3  Lab Results: No results for input(s): WBC, HGB, HCT, PLT in the last 72 hours. BMET No results for input(s): NA, K, CL, CO2, GLUCOSE, BUN, CREATININE, CALCIUM in the last 72 hours. LFT No results for input(s): PROT, ALBUMIN, AST, ALT, ALKPHOS, BILITOT, BILIDIR, IBILI in the last 72 hours. PT/INR No results for input(s): LABPROT, INR in the last 72 hours.   Impression / Plan: This is a 70 y.o.male who presents for Colonoscopy for follow up of prior large EMR SSP.  The risks and benefits of endoscopic evaluation were discussed with the patient; these include but are not limited to the risk of perforation, infection, bleeding, missed lesions, lack of diagnosis, severe illness requiring hospitalization, as well as anesthesia and sedation related illnesses.  The patient is agreeable to proceed.    Justice Britain, MD [facility] Office # [phone]

## 2020-05-30 NOTE — Transfer of Care (Signed)
Immediate Anesthesia Transfer of Care Note  Patient: Charles Tate  Procedure(s) Performed: COLONOSCOPY WITH PROPOFOL (N/A ) ENDOSCOPIC MUCOSAL RESECTION (N/A ) POLYPECTOMY  Patient Location: Endoscopy Unit  Anesthesia Type:MAC  Level of Consciousness: awake, alert  and oriented  Airway & Oxygen Therapy: Patient Spontanous Breathing and Patient connected to face mask oxygen  Post-op Assessment: Report given to RN and Post -op Vital signs reviewed and stable  Post vital signs: Reviewed and stable  Last Vitals:  Vitals Value Taken Time  BP 99/51 05/30/20 0841  Temp    Pulse 67 05/30/20 0843  Resp 15 05/30/20 0843  SpO2 99 % 05/30/20 0843  Vitals shown include unvalidated device data.  Last Pain:  Vitals:   05/30/20 0841  TempSrc:   PainSc: 0-No pain         Complications: No complications documented.

## 2020-05-31 ENCOUNTER — Encounter: Payer: Self-pay | Admitting: Gastroenterology

## 2020-05-31 LAB — SURGICAL PATHOLOGY

## 2020-06-01 ENCOUNTER — Encounter (HOSPITAL_COMMUNITY): Payer: Self-pay | Admitting: Gastroenterology

## 2020-06-24 NOTE — Progress Notes (Signed)
[place] [facility] Phone: [phone] Subjective:   [name], am serving as a scribe for Dr. Hulan Saas. This visit occurred during the SARS-CoV-2 public health emergency.  Safety protocols were in place, including screening questions prior to the visit, additional usage of staff PPE, and extensive cleaning of exam room while observing appropriate contact time as indicated for disinfecting solutions.   I'm seeing this patient by the request  of:  [name], MD  CC: Arm and shoulder pain  LKG:[id number]  CLAIR ALFIERI is a 70 y.o. male coming in with complaint of R shoulder pain since April 16th, 2022. Patient states that he will have shooting pain in anterior shoulder with abduction and ER. Pain occurs for 30 seconds and then goes away. Patient notes that his traps do get very tight as he plays guitar. Patient taking Advil prn.     Past Medical History:  Diagnosis Date  . Allergy   . Arthritis    left hip   . GERD (gastroesophageal reflux disease)    per patient, resolved. 03/15/20  . Heart murmur    adolescent rheumatic fever - developed murmur   . Hyperlipidemia   . Hypertension   . RAD (reactive airway disease)    with RTIs  . Tubular adenoma    Past Surgical History:  Procedure Laterality Date  . COLONOSCOPY    . colonoscopy with polypectomy  2013   Dr Ferdinand Lango, [facility]  . COLONOSCOPY WITH PROPOFOL N/A 07/01/2019   Procedure: COLONOSCOPY WITH PROPOFOL;  Surgeon: Rush Landmark Telford Nab., MD;  Location: [facility];  Service: Gastroenterology;  Laterality: N/A;  . COLONOSCOPY WITH PROPOFOL N/A 05/30/2020   Procedure: COLONOSCOPY WITH PROPOFOL;  Surgeon: Rush Landmark Telford Nab., MD;  Location: [facility];  Service: Gastroenterology;  Laterality: N/A;  . ENDARTERECTOMY Right 04/11/2020   Procedure: Right Carotid Endarterectomy;  Surgeon: Elam Dutch, MD;  Location: [facility];  Service: Vascular;   Laterality: Right;  . ENDOSCOPIC MUCOSAL RESECTION N/A 07/01/2019   Procedure: ENDOSCOPIC MUCOSAL RESECTION;  Surgeon: Rush Landmark Telford Nab., MD;  Location: [facility];  Service: Gastroenterology;  Laterality: N/A;  . ENDOSCOPIC MUCOSAL RESECTION N/A 05/30/2020   Procedure: ENDOSCOPIC MUCOSAL RESECTION;  Surgeon: Rush Landmark Telford Nab., MD;  [facility]: [facility];  Service: Gastroenterology;  Laterality: N/A;  . HEMOSTASIS CLIP PLACEMENT  07/01/2019   Procedure: HEMOSTASIS CLIP PLACEMENT;  Surgeon: Irving Copas., MD;  Location: [facility];  Service: Gastroenterology;;  . POLYPECTOMY    . POLYPECTOMY  07/01/2019   Procedure: POLYPECTOMY;  Surgeon: Mansouraty, Telford Nab., MD;  Location: [facility];  Service: Gastroenterology;;  . POLYPECTOMY  05/30/2020   Procedure: POLYPECTOMY;  Surgeon: Irving Copas., MD;  Location: [facility];  Service: Gastroenterology;;  . Maryagnes Amos INJECTION  07/01/2019   Procedure: SUBMUCOSAL LIFTING INJECTION;  Surgeon: Irving Copas., MD;  Location: [facility];  Service: Gastroenterology;;  . Lia Foyer TATTOO INJECTION  07/01/2019   Procedure: SUBMUCOSAL TATTOO INJECTION;  Surgeon: Irving Copas., MD;  Location: [facility];  Service: Gastroenterology;;  . Arnetha Courser TOOTH EXTRACTION     Social History   Socioeconomic History  . Marital status: Married    Spouse name: [name]  . Number of children: 2  . Years of education: Not on file  . Highest education level: Not on file  Occupational History  . Occupation: self employed   Tobacco Use  . Smoking status: Former Research scientist (life sciences)  . Smokeless  tobacco: Never Used  . Tobacco comment: intermittent, short term smoker as a teen. Some second hand smoke in 63s & 30s  Vaping Use  . Vaping Use: Never used  Substance and Sexual Activity  . Alcohol use: Yes    Comment:   very rarely  . Drug use: No  . Sexual activity: Not on file  Other Topics Concern  . Not on file   Social History Narrative   Exercise: none regularly   Social Determinants of Health   Financial Resource Strain: Low Risk   . Difficulty of Paying Living Expenses: Not hard at all  Food Insecurity: No Food Insecurity  . Worried About Charity fundraiser in the Last Year: Never true  . Ran Out of Food in the Last Year: Never true  Transportation Needs: No Transportation Needs  . Lack of Transportation (Medical): No  . Lack of Transportation (Non-Medical): No  Physical Activity: Not on file  Stress: No Stress Concern Present  . Feeling of Stress : Not at all  Social Connections: Not on file   Allergies  Allergen Reactions  . Ciprofloxacin Hcl     Numbness, joint pain  . Cefdinir Diarrhea    Fever, chills, stomach cramps   . Lisinopril Cough   Family History  Problem Relation Age of Onset  . Arthritis Mother   . Hyperlipidemia Mother   . Lupus Mother   . Heart attack Father 16  . Hypertension Sister   . Heart attack Paternal Grandmother 72  . Diabetes Paternal Aunt   . Stroke Maternal Grandfather 72  . Cancer Paternal Grandfather        ? stomach; in 34s  . Colon cancer Paternal Grandfather   . Colon polyps Neg Hx   . Esophageal cancer Neg Hx   . Rectal cancer Neg Hx   . Stomach cancer Neg Hx   . Inflammatory bowel disease Neg Hx   . Liver disease Neg Hx   . Pancreatic cancer Neg Hx      Current Outpatient Medications (Cardiovascular):  .  atorvastatin (LIPITOR) 40 MG tablet, Take 1 tablet (40 mg total) by mouth daily. Marland Kitchen  telmisartan-hydrochlorothiazide (MICARDIS HCT) 80-25 MG tablet, TAKE 1 TABLET ONCE DAILY (Patient taking differently: Take 1 tablet by mouth daily. TAKE 1 TABLET ONCE DAILY)  Current Outpatient Medications (Respiratory):  .  fluticasone (FLONASE) 50 MCG/ACT nasal spray, SPRAY 2 SPRAYS INTO EACH NOSTRIL EVERY DAY (Patient taking differently: Place 1 spray into both nostrils daily as needed for allergies.)  Current Outpatient Medications  (Analgesics):  .  aspirin EC 81 MG tablet, Take 81 mg by mouth daily. Swallow whole.   Current Outpatient Medications (Other):  Marland Kitchen  Colostrum 500 MG CAPS, Take 500 mg by mouth daily. .  Misc Natural Products (SUPER GREENS PO), Take 1 Scoop by mouth daily.  Marland Kitchen  SUTAB [phone] MG TABS, SMARTSIG:24 Tablet(s) By Mouth As Directed   Reviewed prior external information including notes and imaging from  primary care provider As well as notes that were available from care everywhere and other healthcare systems.  Past medical history, social, surgical and family history all reviewed in electronic medical record.  No pertanent information unless stated regarding to the chief complaint.   Review of Systems:  No headache, visual changes, nausea, vomiting, diarrhea, constipation, dizziness, abdominal pain, skin rash, fevers, chills, night sweats, weight loss, swollen lymph nodes, body aches, joint swelling, chest pain, shortness of breath, mood changes. POSITIVE muscle aches  Objective  Blood pressure 138/82, pulse 64, height 6\' 3"  (1.905 m), weight 207 lb (93.9 kg), SpO2 98 %.   General: No apparent distress alert and oriented x3 mood and affect normal, dressed appropriately.  HEENT: Pupils equal, extraocular movements intact  Respiratory: Patient's speak in full sentences and does not appear short of breath  Cardiovascular: No lower extremity edema, non tender, no erythema  Gait normal with good balance and coordination.  MSK: Neck exam shows Shoulder exam shows on the right side shows the patient does have a mild positive empty can sign.  [place] strength of the rotator cuff compared to contralateral side.  Weakness in the supraspinatus and infraspinatus limited compared to contralateral side.  Mild positive crossover test noted.  Neurovascularly intact distally.  Mild limited range of motion of the neck negative Spurling's test noted.  Limited musculoskeletal ultrasound was performed and  interpreted by [name]  Limited ultrasound of patient's right shoulder possible intrasubstance tearing noted supraspinatus noted.  Patient does have arthritic changes of the acromioclavicular joint.  Patient comorbidities vertigo.  The unremarkable.  Bicep tendon is unremarkable. Impression resistant.  Supraspinatus but no retraction noted  [id number]; 15 additional minutes spent for Therapeutic exercises as stated in above notes.  This included exercises focusing on stretching, strengthening, with significant focus on eccentric aspects.   Long term goals include an improvement in range of motion, strength, endurance as well as avoiding reinjury. Patient's frequency would include in 1-2 times a day, 3-5 times a week for a duration of 6-12 weeks.  Shoulder Exercises that included:  Basic scapular stabilization to include adduction and depression of scapula Scaption, focusing on proper movement and good control Internal and External rotation utilizing a theraband, with elbow tucked at side entire time Rows with theraband  Proper technique shown and discussed handout in great detail with ATC.  All questions were discussed and answered.      Impression and Recommendations:     The above documentation has been reviewed and is accurate and complete [name], DO

## 2020-06-26 NOTE — Progress Notes (Signed)
Subjective:    Patient ID: Charles Tate, male    DOB: 08/14/1950, 70 y.o.   MRN: [id number]  HPI The patient is here for follow up from surgery and to discuss medication.   He is s/p R CEA.   04/11/20   He is taking all of his medications as prescribed.    He is eating well overall.  He has cut back on red meat, cheese and fried food.  He is getting very active with work. He is planning on getting back into [employer].      Right middle finger - swelling and redness and pain near nail.   Medications and allergies reviewed with patient and updated if appropriate.  Patient Active Problem List   Diagnosis Date Noted  . Right shoulder pain 06/27/2020  . Carotid artery stenosis, symptomatic, right 04/11/2020  . S/P carotid endarterectomy, right 04/11/2020  . Serrated adenoma of colon 03/12/2020  . Aortic atherosclerosis ([place]) 02/03/2020  . Stenosis of right carotid artery 01/26/2020  . Branch retinal artery occlusion, right eye 01/19/2020  . Posterior vitreous detachment of both eyes 01/19/2020  . 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 Tear of MCL (medial collateral ligament) of knee, left, initial encounter 08/05/2017  . Gluteal tendinitis of left buttock 08/05/2017  . Left inguinal hernia 07/04/2017  . Allergic rhinitis 12/28/2015  . H/O: rheumatic fever 12/28/2015  . Prediabetes 12/27/2015  . Rising PSA level 02/21/2014  . Personal history of colonic polyps 02/17/2014  . 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 Hyperlipidemia 01/07/2009  . Asthma 06/04/2007  . Essential hypertension 09/26/2006    Current Outpatient Medications on File Prior to Visit  Medication Sig Dispense Refill  . aspirin EC 81 MG tablet Take 81 mg by mouth daily. Swallow whole.    Marland Kitchen atorvastatin (LIPITOR) 40 MG tablet Take 1 tablet (40 mg total) by mouth daily. 90 tablet 3  . Colostrum 500 MG CAPS Take 500 mg by mouth daily.    .  fluticasone (FLONASE) 50 MCG/ACT nasal spray SPRAY 2 SPRAYS INTO EACH NOSTRIL EVERY DAY (Patient taking differently: Place 1 spray into both nostrils daily as needed for allergies.) 16 mL 6  . Misc Natural Products (SUPER GREENS PO) Take 1 Scoop by mouth daily.     Marland Kitchen SUTAB [phone] MG TABS SMARTSIG:24 Tablet(s) By Mouth As Directed    . telmisartan-hydrochlorothiazide (MICARDIS HCT) 80-25 MG tablet TAKE 1 TABLET ONCE DAILY (Patient taking differently: Take 1 tablet by mouth daily. TAKE 1 TABLET ONCE DAILY) 90 tablet 3   No current facility-administered medications on file prior to visit.    Past Medical History:  Diagnosis Date  . Allergy   . Arthritis    left hip   . GERD (gastroesophageal reflux disease)    per patient, resolved. 03/15/20  . Heart murmur    adolescent rheumatic fever - developed murmur   . Hyperlipidemia   . Hypertension   . RAD (reactive airway disease)    with RTIs  . Tubular adenoma     Past Surgical History:  Procedure Laterality Date  . COLONOSCOPY    . colonoscopy with polypectomy  2013   Dr Ferdinand Lango, [facility]  . COLONOSCOPY WITH PROPOFOL N/A 07/01/2019   Procedure: COLONOSCOPY WITH PROPOFOL;  Surgeon: Rush Landmark Telford Nab., MD;  Location: [facility];  Service: Gastroenterology;  Laterality: N/A;  . COLONOSCOPY WITH PROPOFOL N/A 05/30/2020   Procedure: COLONOSCOPY WITH PROPOFOL;  Surgeon: Irving Copas., MD;  Location: [facility];  Service: Gastroenterology;  Laterality: N/A;  . ENDARTERECTOMY Right 04/11/2020   Procedure: Right Carotid Endarterectomy;  Surgeon: Elam Dutch, MD;  Location: [facility];  Service: Vascular;  Laterality: Right;  . ENDOSCOPIC MUCOSAL RESECTION N/A 07/01/2019   Procedure: ENDOSCOPIC MUCOSAL RESECTION;  Surgeon: Rush Landmark Telford Nab., MD;  Location: [facility];  Service: Gastroenterology;  Laterality: N/A;  . ENDOSCOPIC MUCOSAL RESECTION N/A 05/30/2020   Procedure: ENDOSCOPIC MUCOSAL RESECTION;  Surgeon:  Rush Landmark Telford Nab., MD;  Location: [facility];  Service: Gastroenterology;  Laterality: N/A;  . HEMOSTASIS CLIP PLACEMENT  07/01/2019   Procedure: HEMOSTASIS CLIP PLACEMENT;  Surgeon: Irving Copas., MD;  Location: [facility];  Service: Gastroenterology;;  . POLYPECTOMY    . POLYPECTOMY  07/01/2019   Procedure: POLYPECTOMY;  Surgeon: Mansouraty, Telford Nab., MD;  Location: [facility];  Service: Gastroenterology;;  . POLYPECTOMY  05/30/2020   Procedure: POLYPECTOMY;  Surgeon: Irving Copas., MD;  Location: [facility];  Service: Gastroenterology;;  . Maryagnes Amos INJECTION  07/01/2019   Procedure: SUBMUCOSAL LIFTING INJECTION;  Surgeon: Irving Copas., MD;  Location: [facility];  Service: Gastroenterology;;  . Lia Foyer TATTOO INJECTION  07/01/2019   Procedure: SUBMUCOSAL TATTOO INJECTION;  Surgeon: Irving Copas., MD;  Location: [facility];  Service: Gastroenterology;;  . Arnetha Courser TOOTH EXTRACTION      Social History   Socioeconomic History  . Marital status: Married    Spouse name: [name]  . Number of children: 2  . Years of education: Not on file  . Highest education level: Not on file  Occupational History  . Occupation: self employed   Tobacco Use  . Smoking status: Former Research scientist (life sciences)  . Smokeless tobacco: Never Used  . Tobacco comment: intermittent, short term smoker as a teen. Some second hand smoke in 62s & 30s  Vaping Use  . Vaping Use: Never used  Substance and Sexual Activity  . Alcohol use: Yes    Comment:   very rarely  . Drug use: No  . Sexual activity: Not on file  Other Topics Concern  . Not on file  Social History Narrative   Exercise: none regularly   Social Determinants of Health   Financial Resource Strain: Low Risk   . Difficulty of Paying Living Expenses: Not hard at all  Food Insecurity: No Food Insecurity  . Worried About Charity fundraiser in the Last Year: Never true  . Ran Out of Food in the  Last Year: Never true  Transportation Needs: No Transportation Needs  . Lack of Transportation (Medical): No  . Lack of Transportation (Non-Medical): No  Physical Activity: Not on file  Stress: No Stress Concern Present  . Feeling of Stress : Not at all  Social Connections: Not on file    Family History  Problem Relation Age of Onset  . Arthritis Mother   . Hyperlipidemia Mother   . Lupus Mother   . Heart attack Father 1  . Hypertension Sister   . Heart attack Paternal Grandmother 51  . Diabetes Paternal Aunt   . Stroke Maternal Grandfather 2  . Cancer Paternal Grandfather        ? stomach; in 25s  . Colon cancer Paternal Grandfather   . Colon polyps Neg Hx   . Esophageal cancer Neg Hx   . Rectal cancer Neg Hx   . Stomach cancer Neg Hx   . Inflammatory bowel disease Neg Hx   . Liver  disease Neg Hx   . Pancreatic cancer Neg Hx     Review of Systems  Constitutional: Negative for fever.  Eyes: Negative for visual disturbance.  Respiratory: Negative for shortness of breath.   Cardiovascular: Negative for chest pain, palpitations and leg swelling.  Neurological: Negative for dizziness, light-headedness and headaches.       Objective:   Vitals:   06/27/20 1317  BP: 126/74  Pulse: 63  Temp: 98.5 F (36.9 C)  SpO2: 97%   BP Readings from Last 3 Encounters:  06/27/20 126/74  06/27/20 138/82  05/30/20 136/78   Wt Readings from Last 3 Encounters:  06/27/20 206 lb (93.4 kg)  06/27/20 207 lb (93.9 kg)  05/30/20 200 lb (90.7 kg)   Body mass index is 25.75 kg/m.   Physical Exam    Constitutional: Appears well-developed and well-nourished. No distress.  HENT:  Head: Normocephalic and atraumatic.  Neck: Neck supple. No tracheal deviation present. No thyromegaly present.  No cervical lymphadenopathy Cardiovascular: Normal rate, regular rhythm and normal heart sounds.   No murmur heard. No carotid bruit .  No edema Pulmonary/Chest: Effort normal and breath  sounds normal. No respiratory distress. No has no wheezes. No rales.  Skin: Skin is warm and dry. Not diaphoretic. Right middle finger - swelling, erythema and tenderness at base and medial aspect of nail - no discharge or fluctuance.  Psychiatric: Normal mood and affect. Behavior is normal.      Assessment & Plan:    See Problem List for Assessment and Plan of chronic medical problems.    This visit occurred during the SARS-CoV-2 public health emergency.  Safety protocols were in place, including screening questions prior to the visit, additional usage of staff PPE, and extensive cleaning of exam room while observing appropriate contact time as indicated for disinfecting solutions.

## 2020-06-27 ENCOUNTER — Ambulatory Visit: Payer: Self-pay

## 2020-06-27 ENCOUNTER — Encounter: Payer: Self-pay | Admitting: Family Medicine

## 2020-06-27 ENCOUNTER — Ambulatory Visit: Payer: Medicare HMO | Admitting: Family Medicine

## 2020-06-27 ENCOUNTER — Encounter: Payer: Self-pay | Admitting: Internal Medicine

## 2020-06-27 ENCOUNTER — Other Ambulatory Visit: Payer: Self-pay

## 2020-06-27 ENCOUNTER — Ambulatory Visit (INDEPENDENT_AMBULATORY_CARE_PROVIDER_SITE_OTHER): Payer: Medicare HMO | Admitting: Internal Medicine

## 2020-06-27 ENCOUNTER — Ambulatory Visit (INDEPENDENT_AMBULATORY_CARE_PROVIDER_SITE_OTHER): Payer: Medicare HMO

## 2020-06-27 VITALS — BP 138/82 | HR 64 | Ht 75.0 in | Wt 207.0 lb

## 2020-06-27 VITALS — BP 126/74 | HR 63 | Temp 98.5°F | Ht 75.0 in | Wt 206.0 lb

## 2020-06-27 DIAGNOSIS — R7303 Prediabetes: Secondary | ICD-10-CM | POA: Diagnosis not present

## 2020-06-27 DIAGNOSIS — I6521 Occlusion and stenosis of right carotid artery: Secondary | ICD-10-CM | POA: Diagnosis not present

## 2020-06-27 DIAGNOSIS — L03011 Cellulitis of right finger: Secondary | ICD-10-CM | POA: Diagnosis not present

## 2020-06-27 DIAGNOSIS — I10 Essential (primary) hypertension: Secondary | ICD-10-CM

## 2020-06-27 DIAGNOSIS — M25511 Pain in right shoulder: Secondary | ICD-10-CM

## 2020-06-27 DIAGNOSIS — E782 Mixed hyperlipidemia: Secondary | ICD-10-CM

## 2020-06-27 DIAGNOSIS — S4991XA Unspecified injury of right shoulder and upper arm, initial encounter: Secondary | ICD-10-CM | POA: Diagnosis not present

## 2020-06-27 LAB — HEMOGLOBIN A1C: Hgb A1c MFr Bld: 6 % (ref 4.6–6.5)

## 2020-06-27 MED ORDER — DOXYCYCLINE HYCLATE 100 MG PO TABS: 100.0000 mg | ORAL_TABLET | Freq: Two times a day (BID) | ORAL | 0 refills | Status: AC

## 2020-06-27 NOTE — Assessment & Plan Note (Signed)
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

## 2020-06-27 NOTE — Assessment & Plan Note (Signed)
Chronic Check a1c Low sugar / carb diet Stressed regular exercise  

## 2020-06-27 NOTE — Assessment & Plan Note (Signed)
Acute Redness, swelling and pain and base and medial aspect of nail  Has tried soaking in salt water - not improving Tender on exam - no fluctuance Doxycycline 100 mg bid x 7 days Continue warm water soaks

## 2020-06-27 NOTE — Patient Instructions (Addendum)
  Blood work was ordered.    Medications changes include :   Doxycycline 100 mg bid x 7 days  Your prescription(s) have been submitted to your pharmacy. Please take as directed and contact our office if you believe you are having problem(s) with the medication(s).    Please followup in 6 months

## 2020-06-27 NOTE — Assessment & Plan Note (Signed)
Chronic S/p CEA Continue atorvastatin 40 mg daily Lipids, cmp today LDL goal < 70 Discussed healthy diet, exercise, good BP, cholesterol and sugar control

## 2020-06-27 NOTE — Patient Instructions (Addendum)
Xray today Exercises-Do on both sides PT [employer] 2x a day Ice 20 min 2x a day Hands in peripheral vision See me in 6-8 weeks

## 2020-06-27 NOTE — Assessment & Plan Note (Signed)
Chronic BP well controlled Continue micardix hct 80-25 mg daily cmp

## 2020-06-27 NOTE — Assessment & Plan Note (Signed)
Chronic Check lipid panel  Continue atorvastatin 40 mg daily Regular exercise and healthy diet encouraged  

## 2020-06-28 LAB — LIPID PANEL
Cholesterol: 125 mg/dL (ref 0–200)
HDL: 36.8 mg/dL — ABNORMAL LOW (ref >39.00)
LDL Cholesterol: 70 mg/dL (ref 0–99)
NonHDL: 88.67
Total CHOL/HDL Ratio: 3
Triglycerides: 92 mg/dL (ref 0.0–149.0)
VLDL: 18.4 mg/dL (ref 0.0–40.0)

## 2020-06-28 LAB — COMPREHENSIVE METABOLIC PANEL
ALT: 18 U/L (ref 0–53)
AST: 18 U/L (ref 0–37)
Albumin: 4.2 g/dL (ref 3.5–5.2)
Alkaline Phosphatase: 90 U/L (ref 39–117)
BUN: 19 mg/dL (ref 6–23)
CO2: 28 mEq/L (ref 19–32)
Calcium: 9.4 mg/dL (ref 8.4–10.5)
Chloride: 101 mEq/L (ref 96–112)
Creatinine, Ser: 0.86 mg/dL (ref 0.40–1.50)
GFR: 87.43 mL/min (ref >60.00)
Glucose, Bld: 79 mg/dL (ref 70–99)
Potassium: 4 mEq/L (ref 3.5–5.1)
Sodium: 139 mEq/L (ref 135–145)
Total Bilirubin: 0.6 mg/dL (ref 0.2–1.2)
Total Protein: 7.4 g/dL (ref 6.0–8.3)

## 2020-07-05 ENCOUNTER — Other Ambulatory Visit: Payer: Self-pay

## 2020-07-05 ENCOUNTER — Encounter: Payer: Self-pay | Admitting: Physical Therapy

## 2020-07-05 ENCOUNTER — Ambulatory Visit: Payer: Medicare HMO | Attending: Family Medicine | Admitting: Physical Therapy

## 2020-07-05 DIAGNOSIS — M25611 Stiffness of right shoulder, not elsewhere classified: Secondary | ICD-10-CM

## 2020-07-05 DIAGNOSIS — M25511 Pain in right shoulder: Secondary | ICD-10-CM

## 2020-07-05 DIAGNOSIS — M6281 Muscle weakness (generalized): Secondary | ICD-10-CM | POA: Diagnosis not present

## 2020-07-05 DIAGNOSIS — R600 Localized edema: Secondary | ICD-10-CM

## 2020-07-05 NOTE — Patient Instructions (Signed)
Access Code: [email or web address] URL: [email or web address] Date: 07/05/2020 Prepared by: Amador Cunas  Exercises Standing Single Arm Shoulder Abduction with Resistance - 1 x daily - 7 x weekly - 2 sets - 10 reps Standing Shoulder Flexion with Resistance - 1 x daily - 7 x weekly - 2 sets - 10 reps Standing Shoulder Row with Anchored Resistance - 1 x daily - 7 x weekly - 2 sets - 10 reps Shoulder extension with resistance - Neutral - 1 x daily - 7 x weekly - 3 sets - 10 reps Circular Shoulder Pendulum with Table Support - 1 x daily - 7 x weekly - 2 sets - 10 reps

## 2020-07-05 NOTE — Therapy (Signed)
[place] [place], [place], [address] Phone: [phone]   Fax:  [phone]  Physical Therapy Evaluation  Patient Details  Name: Charles Tate MRN: [id number] Date of Birth: 1950/08/14 Referring Provider (PT): Esmeralda Arthur Date: 07/05/2020   PT End of Session - 07/05/20 1618    Visit Number 1    Date for PT Re-Evaluation 09/27/20    PT Start Time 1525    PT Stop Time 1602    PT Time Calculation (min) 37 min    Activity Tolerance Patient tolerated treatment well    Behavior During Therapy [facility] for tasks assessed/performed           Past Medical History:  Diagnosis Date  . Allergy   . Arthritis    left hip   . GERD (gastroesophageal reflux disease)    per patient, resolved. 03/15/20  . Heart murmur    adolescent rheumatic fever - developed murmur   . Hyperlipidemia   . Hypertension   . RAD (reactive airway disease)    with RTIs  . Tubular adenoma     Past Surgical History:  Procedure Laterality Date  . COLONOSCOPY    . colonoscopy with polypectomy  2013   Dr Ferdinand Lango, [facility]  . COLONOSCOPY WITH PROPOFOL N/A 07/01/2019   Procedure: COLONOSCOPY WITH PROPOFOL;  Surgeon: Rush Landmark Telford Nab., MD;  Location: [facility];  Service: Gastroenterology;  Laterality: N/A;  . COLONOSCOPY WITH PROPOFOL N/A 05/30/2020   Procedure: COLONOSCOPY WITH PROPOFOL;  Surgeon: Rush Landmark Telford Nab., MD;  Location: [facility];  Service: Gastroenterology;  Laterality: N/A;  . ENDARTERECTOMY Right 04/11/2020   Procedure: Right Carotid Endarterectomy;  Surgeon: Elam Dutch, MD;  Location: [place];  Service: Vascular;  Laterality: Right;  . ENDOSCOPIC MUCOSAL RESECTION N/A 07/01/2019   Procedure: ENDOSCOPIC MUCOSAL RESECTION;  Surgeon: Rush Landmark Telford Nab., MD;  Location: [facility];  Service: Gastroenterology;  Laterality: N/A;  . ENDOSCOPIC MUCOSAL RESECTION N/A 05/30/2020   Procedure: ENDOSCOPIC MUCOSAL  RESECTION;  Surgeon: Rush Landmark Telford Nab., MD;  [facility]: [facility];  Service: Gastroenterology;  Laterality: N/A;  . HEMOSTASIS CLIP PLACEMENT  07/01/2019   Procedure: HEMOSTASIS CLIP PLACEMENT;  Surgeon: Irving Copas., MD;  Location: [facility];  Service: Gastroenterology;;  . POLYPECTOMY    . POLYPECTOMY  07/01/2019   Procedure: POLYPECTOMY;  Surgeon: Mansouraty, Telford Nab., MD;  Location: [facility];  Service: Gastroenterology;;  . POLYPECTOMY  05/30/2020   Procedure: POLYPECTOMY;  Surgeon: Irving Copas., MD;  Location: [facility];  Service: Gastroenterology;;  . Maryagnes Amos INJECTION  07/01/2019   Procedure: SUBMUCOSAL LIFTING INJECTION;  Surgeon: Irving Copas., MD;  Location: [facility];  Service: Gastroenterology;;  . Lia Foyer TATTOO INJECTION  07/01/2019   Procedure: SUBMUCOSAL TATTOO INJECTION;  Surgeon: Irving Copas., MD;  Location: [facility];  Service: Gastroenterology;;  . Arnetha Courser TOOTH EXTRACTION      There were no vitals filed for this visit.    Subjective Assessment - 07/05/20 1529    Subjective Pt states that he has been having R shoulder pain for several weeks. 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 After that, he could not lift arm, reach overhead, or reach behind him without pain. Had [place] and xray  which showed some mild arthritis and a mild rotator cuff tear on R. Pt also notes that he has a trigger point/knot in L UT with soreness d/t years of holding guitar.    Limitations Lifting;House hold activities    Diagnostic tests [place] and xray R shoulder    Patient Stated Goals get rid of pain, be able to use arm again    Currently in Pain? No/denies    Pain Score 0-No pain   rates 3/10 with movement   Pain Orientation Right    Pain Descriptors /  Indicators Aching;Sore    Pain Type Acute pain    Pain Onset 1 to 4 weeks ago    Pain Frequency Intermittent    Aggravating Factors  reaching overhead, lifting, reaching behind head    Pain Relieving Factors rest, voltarin gel              [facility] PT Assessment - 07/05/20 0001      Assessment   Medical Diagnosis R shoulder pain    Referring Provider (PT) Tamala Julian    Onset Date/Surgical Date 06/18/20    Hand Dominance Left    Next MD Visit 08/15/2020    Prior Therapy PT for L knee and hip pain      Precautions   Precautions None      Balance Screen   Has the patient fallen in the past 6 months No    Has the patient had a decrease in activity level because of a fear of falling?  No    Is the patient reluctant to leave their home because of a fear of falling?  No      Home Environment   Additional Comments does yardwork, no stairs      Prior Function   Level of Independence Independent    Vocation Part time employment    Vocation Requirements carpet cleaning    Leisure plays in bands, lifts speakers and keyboard      Sensation   Light Touch Appears Intact      Posture/Postural Control   Posture/Postural Control Postural limitations    Postural Limitations Rounded Shoulders;Forward head      ROM / Strength   AROM / PROM / Strength AROM;PROM;Strength      AROM   Overall AROM Comments L shoulder AROM WFL no pain    AROM Assessment Site Shoulder    Right/Left Shoulder Right    Right Shoulder Flexion 110 Degrees    Right Shoulder ABduction 65 Degrees    Right Shoulder Internal Rotation --   to illiac crest with visible shaking   Right Shoulder External Rotation --   to C7 slow and guarded     PROM   PROM Assessment Site Shoulder    Right/Left Shoulder Right    Right Shoulder Internal Rotation 30 Degrees    Right Shoulder External Rotation 60 Degrees      Strength   Strength Assessment Site Shoulder;Elbow    Right/Left Shoulder Right;Left    Right Shoulder Flexion  4/5    Right Shoulder ABduction 3+/5    Right Shoulder Internal Rotation 4-/5    Right Shoulder External Rotation 4-/5    Left Shoulder Flexion 5/5    Left Shoulder ABduction 5/5    Left Shoulder Internal Rotation 5/5    Left Shoulder External Rotation 5/5    Right/Left Elbow Right;Left    Right Elbow Flexion 4+/5    Right Elbow Extension 5/5    Left Elbow Flexion  5/5    Left Elbow Extension 5/5      Palpation   Palpation comment tenderness to palpation L UT; mild tenderness R UT and R posterior shoulder                      Objective measurements completed on examination: See above findings.       [facility] Adult PT Treatment/Exercise - 07/05/20 0001      Exercises   Exercises Shoulder      Shoulder Exercises: Standing   Flexion Right;10 reps    Theraband Level (Shoulder Flexion) Level 2 (Red)    Flexion Limitations partial ROM    ABduction Right;10 reps    Theraband Level (Shoulder ABduction) Level 2 (Red)    ABduction Limitations partial ROM within painfree range    Extension Both;10 reps    Theraband Level (Shoulder Extension) Level 2 (Red)    Row Both;10 reps    Theraband Level (Shoulder Row) Level 2 (Red)                  PT Education - 07/05/20 1613    Education Details Pt educated on POC and HEP    Person(s) Educated Patient    Methods Explanation;Demonstration;Handout    Comprehension Verbalized understanding;Returned demonstration            PT Short Term Goals - 07/05/20 1624      PT SHORT TERM GOAL #1   Title independent with initial HEP    Time 2    Period Weeks    Status New    Target Date 07/19/20             PT Long Term Goals - 07/05/20 1625      PT LONG TERM GOAL #1   Title Pt will be I with advanced HEP    Time 8    Period Weeks    Status New    Target Date 08/30/20      PT LONG TERM GOAL #2   Title Pt will report 50% reduction in R shoulder pain with activity    Time 8    Period Weeks    Status New     Target Date 08/30/20      PT LONG TERM GOAL #3   Title Pt will demo R shoulder AROM equivalent to L shoulder    Time 8    Period Weeks    Status New    Target Date 08/30/20      PT LONG TERM GOAL #4   Title Pt will report able to perform all ADLs with </=1/10 R shoulder pain    Time 8    Period Weeks    Status New    Target Date 08/30/20                  Plan - 07/05/20 1618    Clinical Impression Statement Pt presents to clinic with reports of R shoulder pain present since 06/18/20 when lifting a speaker overhead. [place] to R shoulder shows mild RC tear and xray demos mild arthritis. Positive impingement testing along with limitations in R shoulder AROM, deficits in strength with MMT testing, and TTP R UT/post shoulder. Pt demos difficulties reaching overhead, reaching behind back, and lifting above shoulder level. Pt also has palpable trigger point and soreness in L UT from years of playing guitar; may incorporate DN or manual to L UT as well. Pt would benefit from skilled PT to  address the above impairments.    Examination-Activity Limitations Reach Overhead;Lift    Examination-Participation Restrictions Community Activity;Interpersonal Relationship    Stability/Clinical Decision Making Stable/Uncomplicated    Clinical Decision Making Low    Rehab Potential Good    PT Frequency 1x / week    PT Duration 8 weeks    PT Treatment/Interventions ADLs/Self Care Home Management;Cryotherapy;Iontophoresis 4mg /ml Dexamethasone;Therapeutic activities;Therapeutic exercise;Neuromuscular re-education;Manual techniques;Patient/family education;Electrical Stimulation;Dry needling;Passive range of motion    PT Next Visit Plan review/progress HEP, gentle progression to TE, painfree ROM, scap stab    PT Home Exercise Plan see pt instructions    Consulted and Agree with Plan of Care Patient           Patient will benefit from skilled therapeutic intervention in order to improve the following  deficits and impairments:  Decreased range of motion,Increased muscle spasms,Impaired UE functional use,Pain,Hypomobility,Decreased strength,Postural dysfunction  Visit Diagnosis: Acute pain of right shoulder  Stiffness of right shoulder, not elsewhere classified  Muscle weakness (generalized)  Localized edema     Problem List Patient Active Problem List   Diagnosis Date Noted  . Right shoulder pain 06/27/2020  . Paronychia of right middle finger 06/27/2020  . S/P carotid endarterectomy, right 04/11/2020  . Serrated adenoma of colon 03/12/2020  . Aortic atherosclerosis ([place]) 02/03/2020  . Stenosis of right carotid artery 01/26/2020  . Branch retinal artery occlusion, right eye 01/19/2020  . Posterior vitreous detachment of both eyes 01/19/2020  . 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 Tear of MCL (medial collateral ligament) of knee, left, initial encounter 08/05/2017  . Gluteal tendinitis of left buttock 08/05/2017  . Left inguinal hernia 07/04/2017  . Allergic rhinitis 12/28/2015  . H/O: rheumatic fever 12/28/2015  . Prediabetes 12/27/2015  . Rising PSA level 02/21/2014  . Personal history of colonic polyps 02/17/2014  . 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 Hyperlipidemia 01/07/2009  . Asthma 06/04/2007  . Essential hypertension 09/26/2006   Amador Cunas, PT, DPT Donald Prose Fani Rotondo 07/05/2020, 4:28 PM  [place]. [place], [place], [address] Phone: [phone]   Fax:  [phone]  Name: LAYN KYE MRN: [id number] Date of Birth: 1951-03-04

## 2020-07-11 DIAGNOSIS — H5203 Hypermetropia, bilateral: Secondary | ICD-10-CM | POA: Diagnosis not present

## 2020-07-11 DIAGNOSIS — H524 Presbyopia: Secondary | ICD-10-CM | POA: Diagnosis not present

## 2020-07-11 DIAGNOSIS — H52223 Regular astigmatism, bilateral: Secondary | ICD-10-CM | POA: Diagnosis not present

## 2020-07-13 ENCOUNTER — Ambulatory Visit: Payer: Medicare HMO | Admitting: Physical Therapy

## 2020-07-13 ENCOUNTER — Encounter: Payer: Self-pay | Admitting: Physical Therapy

## 2020-07-13 ENCOUNTER — Other Ambulatory Visit: Payer: Self-pay

## 2020-07-13 DIAGNOSIS — R600 Localized edema: Secondary | ICD-10-CM | POA: Diagnosis not present

## 2020-07-13 DIAGNOSIS — M25511 Pain in right shoulder: Secondary | ICD-10-CM | POA: Diagnosis not present

## 2020-07-13 DIAGNOSIS — M25611 Stiffness of right shoulder, not elsewhere classified: Secondary | ICD-10-CM | POA: Diagnosis not present

## 2020-07-13 DIAGNOSIS — M6281 Muscle weakness (generalized): Secondary | ICD-10-CM

## 2020-07-13 NOTE — Therapy (Signed)
[place]. [place], [place], [address] Phone: [phone]   Fax:  [phone]  Physical Therapy Treatment  Patient Details  Name: QUANELL LOUGHNEY MRN: [id number] Date of Birth: 1951/02/02 Referring Provider (PT): Esmeralda Arthur Date: 07/13/2020   PT End of Session - 07/13/20 0846    Visit Number 2    Date for PT Re-Evaluation 09/27/20    PT Start Time 0802    PT Stop Time 0845    PT Time Calculation (min) 43 min    Activity Tolerance Patient tolerated treatment well    Behavior During Therapy [facility] for tasks assessed/performed           Past Medical History:  Diagnosis Date  . Allergy   . Arthritis    left hip   . GERD (gastroesophageal reflux disease)    per patient, resolved. 03/15/20  . Heart murmur    adolescent rheumatic fever - developed murmur   . Hyperlipidemia   . Hypertension   . RAD (reactive airway disease)    with RTIs  . Tubular adenoma     Past Surgical History:  Procedure Laterality Date  . COLONOSCOPY    . colonoscopy with polypectomy  2013   Dr Ferdinand Lango, [facility]  . COLONOSCOPY WITH PROPOFOL N/A 07/01/2019   Procedure: COLONOSCOPY WITH PROPOFOL;  Surgeon: Rush Landmark Telford Nab., MD;  Location: [facility];  Service: Gastroenterology;  Laterality: N/A;  . COLONOSCOPY WITH PROPOFOL N/A 05/30/2020   Procedure: COLONOSCOPY WITH PROPOFOL;  Surgeon: Rush Landmark Telford Nab., MD;  [facility]: [facility];  Service: Gastroenterology;  Laterality: N/A;  . ENDARTERECTOMY Right 04/11/2020   Procedure: Right Carotid Endarterectomy;  Surgeon: Elam Dutch, MD;  Location: [facility];  Service: Vascular;  Laterality: Right;  . ENDOSCOPIC MUCOSAL RESECTION N/A 07/01/2019   Procedure: ENDOSCOPIC MUCOSAL RESECTION;  Surgeon: Rush Landmark Telford Nab., MD;  Location: [facility];  Service: Gastroenterology;  Laterality: N/A;  . ENDOSCOPIC MUCOSAL RESECTION N/A 05/30/2020   Procedure: ENDOSCOPIC MUCOSAL  RESECTION;  Surgeon: Rush Landmark Telford Nab., MD;  Location: [facility];  Service: Gastroenterology;  Laterality: N/A;  . HEMOSTASIS CLIP PLACEMENT  07/01/2019   Procedure: HEMOSTASIS CLIP PLACEMENT;  Surgeon: Irving Copas., MD;  Location: [facility];  Service: Gastroenterology;;  . POLYPECTOMY    . POLYPECTOMY  07/01/2019   Procedure: POLYPECTOMY;  Surgeon: Mansouraty, Telford Nab., MD;  Location: [facility];  Service: Gastroenterology;;  . POLYPECTOMY  05/30/2020   Procedure: POLYPECTOMY;  Surgeon: Irving Copas., MD;  Location: [facility];  Service: Gastroenterology;;  . Maryagnes Amos INJECTION  07/01/2019   Procedure: SUBMUCOSAL LIFTING INJECTION;  Surgeon: Irving Copas., MD;  Location: [facility];  Service: Gastroenterology;;  . Lia Foyer TATTOO INJECTION  07/01/2019   Procedure: SUBMUCOSAL TATTOO INJECTION;  Surgeon: Irving Copas., MD;  Location: [facility];  Service: Gastroenterology;;  . Arnetha Courser TOOTH EXTRACTION      There were no vitals filed for this visit.   Subjective Assessment - 07/13/20 0810    Subjective Pt states no pain at rest but sharp pains when reaching behind or twisting to the R.    Currently in Pain? No/denies    Pain Score 0-No pain    Pain Orientation Right              [facility] PT Assessment - 07/13/20 0001      AROM   Overall AROM Comments limited d/t onset of pain at end ranges  Right Shoulder Flexion 105 Degrees    Right Shoulder ABduction 45 Degrees                         [facility] Adult PT Treatment/Exercise - 07/13/20 0001      Exercises   Exercises Shoulder      Shoulder Exercises: Supine   Other Supine Exercises supine shoulder flexion/abduction/ER with 1# weight bar    Other Supine Exercises R chest press with shoulder protraction x10 2#      Shoulder Exercises: Standing   Other Standing Exercises finger ladder flexion/abduction x10      Shoulder Exercises:  ROM/Strengthening   UBE (Upper Arm Bike) L 1.5 x 3 min each      Manual Therapy   Manual Therapy Joint mobilization;Passive ROM    Joint Mobilization gentle R shoulder distraction    Passive ROM R shoulder PROM all directions within painfree ranges                    PT Short Term Goals - 07/13/20 0854      PT SHORT TERM GOAL #1   Title independent with initial HEP    Time 2    Period Weeks    Status Achieved    Target Date 07/19/20             PT Long Term Goals - 07/05/20 1625      PT LONG TERM GOAL #1   Title Pt will be I with advanced HEP    Time 8    Period Weeks    Status New    Target Date 08/30/20      PT LONG TERM GOAL #2   Title Pt will report 50% reduction in R shoulder pain with activity    Time 8    Period Weeks    Status New    Target Date 08/30/20      PT LONG TERM GOAL #3   Title Pt will demo R shoulder AROM equivalent to L shoulder    Time 8    Period Weeks    Status New    Target Date 08/30/20      PT LONG TERM GOAL #4   Title Pt will report able to perform all ADLs with </=1/10 R shoulder pain    Time 8    Period Weeks    Status New    Target Date 08/30/20                 Plan - 07/13/20 0846    Clinical Impression Statement Pt tolerated progression to TE well. Reporting increased guarding of R shoulder today after picking up granddaughter yesterday; felt sharp pain and shoulder has been increasingly sore since. Demos decreased R shoulder ROM from time of eval. Focused today's session on shoulder flexibility and manual PROM within painfree ranges. Added several shoulder ROM ex's with dowel with pt demo understanding. Continue to progress ROM/strength to tolerance.    PT Treatment/Interventions ADLs/Self Care Home Management;Cryotherapy;Iontophoresis 4mg /ml Dexamethasone;Therapeutic activities;Therapeutic exercise;Neuromuscular re-education;Manual techniques;Patient/family education;Electrical Stimulation;Dry  needling;Passive range of motion    PT Next Visit Plan review/progress HEP, gentle progression to TE, painfree ROM, scap stab    Consulted and Agree with Plan of Care Patient           Patient will benefit from skilled therapeutic intervention in order to improve the following deficits and impairments:  Decreased range of motion,Increased muscle spasms,Impaired UE functional use,Pain,Hypomobility,Decreased  strength,Postural dysfunction  Visit Diagnosis: Acute pain of right shoulder  Stiffness of right shoulder, not elsewhere classified  Muscle weakness (generalized)  Localized edema     Problem List Patient Active Problem List   Diagnosis Date Noted  . Right shoulder pain 06/27/2020  . Paronychia of right middle finger 06/27/2020  . S/P carotid endarterectomy, right 04/11/2020  . Serrated adenoma of colon 03/12/2020  . Aortic atherosclerosis ([place]) 02/03/2020  . Stenosis of right carotid artery 01/26/2020  . Branch retinal artery occlusion, right eye 01/19/2020  . Posterior vitreous detachment of both eyes 01/19/2020  . 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 Tear of MCL (medial collateral ligament) of knee, left, initial encounter 08/05/2017  . Gluteal tendinitis of left buttock 08/05/2017  . Left inguinal hernia 07/04/2017  . Allergic rhinitis 12/28/2015  . H/O: rheumatic fever 12/28/2015  . Prediabetes 12/27/2015  . Rising PSA level 02/21/2014  . Personal history of colonic polyps 02/17/2014  . 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 Hyperlipidemia 01/07/2009  . Asthma 06/04/2007  . Essential hypertension 09/26/2006   Amador Cunas, PT, DPT Donald Prose Jaden Abreu 07/13/2020, 8:55 AM  [place]. [place], [place], [address] Phone: [phone]   Fax:  [phone]  Name: AIRRION OTTING MRN: [id number] Date of Birth: [birth year]

## 2020-07-19 DIAGNOSIS — H524 Presbyopia: Secondary | ICD-10-CM | POA: Diagnosis not present

## 2020-07-19 DIAGNOSIS — H52223 Regular astigmatism, bilateral: Secondary | ICD-10-CM | POA: Diagnosis not present

## 2020-07-20 ENCOUNTER — Ambulatory Visit: Payer: Medicare HMO | Admitting: Physical Therapy

## 2020-07-20 ENCOUNTER — Encounter: Payer: Self-pay | Admitting: Physical Therapy

## 2020-07-20 ENCOUNTER — Other Ambulatory Visit: Payer: Self-pay

## 2020-07-20 DIAGNOSIS — R600 Localized edema: Secondary | ICD-10-CM | POA: Diagnosis not present

## 2020-07-20 DIAGNOSIS — M6281 Muscle weakness (generalized): Secondary | ICD-10-CM

## 2020-07-20 DIAGNOSIS — M25611 Stiffness of right shoulder, not elsewhere classified: Secondary | ICD-10-CM

## 2020-07-20 DIAGNOSIS — M25511 Pain in right shoulder: Secondary | ICD-10-CM

## 2020-07-20 NOTE — Therapy (Signed)
[place]. [place], [place], [address] Phone: [phone]   Fax:  [phone]  Physical Therapy Treatment  Patient Details  Name: Charles Tate MRN: [id number] Date of Birth: 1950-10-26 Referring Provider (PT): Esmeralda Arthur Date: 07/20/2020   PT End of Session - 07/20/20 1626    Visit Number 3    Date for PT Re-Evaluation 09/27/20    PT Start Time 2951    PT Stop Time 8841    PT Time Calculation (min) 44 min    Activity Tolerance Patient tolerated treatment well    Behavior During Therapy [facility] for tasks assessed/performed           Past Medical History:  Diagnosis Date  . Allergy   . Arthritis    left hip   . GERD (gastroesophageal reflux disease)    per patient, resolved. 03/15/20  . Heart murmur    adolescent rheumatic fever - developed murmur   . Hyperlipidemia   . Hypertension   . RAD (reactive airway disease)    with RTIs  . Tubular adenoma     Past Surgical History:  Procedure Laterality Date  . COLONOSCOPY    . colonoscopy with polypectomy  2013   Dr Ferdinand Lango, [facility]  . COLONOSCOPY WITH PROPOFOL N/A 07/01/2019   Procedure: COLONOSCOPY WITH PROPOFOL;  Surgeon: Rush Landmark Telford Nab., MD;  Location: [facility];  Service: Gastroenterology;  Laterality: N/A;  . COLONOSCOPY WITH PROPOFOL N/A 05/30/2020   Procedure: COLONOSCOPY WITH PROPOFOL;  Surgeon: Rush Landmark Telford Nab., MD;  [facility];  Service: Gastroenterology;  Laterality: N/A;  . ENDARTERECTOMY Right 04/11/2020   Procedure: Right Carotid Endarterectomy;  Surgeon: Elam Dutch, MD;  Location: [place];  Service: Vascular;  Laterality: Right;  . ENDOSCOPIC MUCOSAL RESECTION N/A 07/01/2019   Procedure: ENDOSCOPIC MUCOSAL RESECTION;  Surgeon: Rush Landmark Telford Nab., MD;  Location: [facility];  Service: Gastroenterology;  Laterality: N/A;  . ENDOSCOPIC MUCOSAL RESECTION N/A 05/30/2020   Procedure: ENDOSCOPIC MUCOSAL  RESECTION;  Surgeon: Rush Landmark Telford Nab., MD;  [facility];  Service: Gastroenterology;  Laterality: N/A;  . HEMOSTASIS CLIP PLACEMENT  07/01/2019   Procedure: HEMOSTASIS CLIP PLACEMENT;  Surgeon: Irving Copas., MD;  [facility];  Service: Gastroenterology;;  . POLYPECTOMY    . POLYPECTOMY  07/01/2019   Procedure: POLYPECTOMY;  Surgeon: Mansouraty, Telford Nab., MD;  Location: [facility];  Service: Gastroenterology;;  . POLYPECTOMY  05/30/2020   Procedure: POLYPECTOMY;  Surgeon: Irving Copas., MD;  Location: [facility];  Service: Gastroenterology;;  . Maryagnes Amos INJECTION  07/01/2019   Procedure: SUBMUCOSAL LIFTING INJECTION;  Surgeon: Irving Copas., MD;  Location: [facility];  Service: Gastroenterology;;  . Lia Foyer TATTOO INJECTION  07/01/2019   Procedure: SUBMUCOSAL TATTOO INJECTION;  Surgeon: Irving Copas., MD;  [facility];  Service: Gastroenterology;;  . Arnetha Courser TOOTH EXTRACTION      There were no vitals filed for this visit.   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 Did modify how he put guitar strap on to avoid excessive L shoulder IR/ER    Currently in Pain? No/denies              [facility] PT Assessment - 07/20/20 0001      Observation/Other Assessments   Focus on Therapeutic Outcomes (FOTO)  62%      AROM  Right Shoulder Flexion 135 Degrees    Right Shoulder ABduction 105 Degrees                         [facility] Adult PT Treatment/Exercise - 07/20/20 0001      Shoulder Exercises: Supine   Other Supine Exercises supine shoulder flexion/abduction/ER with 1# weight bar      Shoulder Exercises: Standing   Other Standing Exercises finger ladder flexion/abduction x10      Shoulder Exercises: ROM/Strengthening   UBE (Upper Arm Bike) L2 x 3 min each      Shoulder Exercises: Stretch   Wall Stretch  - Flexion 5 reps;10 seconds    Wall Stretch - ABduction 10 seconds;2 reps    Wall Stretch - ABduction Limitations modified to seated table stretch d/t pain    Table Stretch - Abduction 5 reps;10 seconds    Table Stretch - ABduction Limitations reports relief      Manual Therapy   Manual Therapy Joint mobilization;Passive ROM    Joint Mobilization gentle R shoulder distraction    Passive ROM R shoulder PROM all directions within painfree ranges                    PT Short Term Goals - 07/13/20 0854      PT SHORT TERM GOAL #1   Title independent with initial HEP    Time 2    Period Weeks    Status Achieved    Target Date 07/19/20             PT Long Term Goals - 07/20/20 1633      PT LONG TERM GOAL #1   Title Pt will be I with advanced HEP    Time 8    Period Weeks    Status On-going      PT LONG TERM GOAL #2   Title Pt will report 50% reduction in R shoulder pain with activity    Time 8    Period Weeks    Status On-going      PT LONG TERM GOAL #3   Title Pt will demo R shoulder AROM equivalent to L shoulder    Time 8    Period Weeks    Status On-going      PT LONG TERM GOAL #4   Title Pt will report able to perform all ADLs with </=1/10 R shoulder pain    Time 8    Period Weeks    Status On-going                 Plan - 07/20/20 1627    Clinical Impression Statement Pt demos improvements in R shoulder ROM since last week along with improvement in FOTO score since time of eval. Modified standing abduction wall stretch to seated d/t increased pain standing, reported relief with seated stretch. Continue to progress ROM/strength to tolerance.    PT Treatment/Interventions ADLs/Self Care Home Management;Cryotherapy;Iontophoresis 4mg /ml Dexamethasone;Therapeutic activities;Therapeutic exercise;Neuromuscular re-education;Manual techniques;Patient/family education;Electrical Stimulation;Dry needling;Passive range of motion    PT Next Visit Plan  review/progress HEP, gentle progression to TE, painfree ROM, scap stab    Consulted and Agree with Plan of Care Patient           Patient will benefit from skilled therapeutic intervention in order to improve the following deficits and impairments:  Decreased range of motion,Increased muscle spasms,Impaired UE functional use,Pain,Hypomobility,Decreased strength,Postural dysfunction  Visit Diagnosis: Acute pain of right shoulder  Stiffness of right shoulder, not elsewhere classified  Muscle weakness (generalized)  Localized edema     Problem List Patient Active Problem List   Diagnosis Date Noted  . Right shoulder pain 06/27/2020  . Paronychia of right middle finger 06/27/2020  . S/P carotid endarterectomy, right 04/11/2020  . Serrated adenoma of colon 03/12/2020  . Aortic atherosclerosis ([place]) 02/03/2020  . Stenosis of right carotid artery 01/26/2020  . Branch retinal artery occlusion, right eye 01/19/2020  . Posterior vitreous detachment of both eyes 01/19/2020  . 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 Tear of MCL (medial collateral ligament) of knee, left, initial encounter 08/05/2017  . Gluteal tendinitis of left buttock 08/05/2017  . Left inguinal hernia 07/04/2017  . Allergic rhinitis 12/28/2015  . H/O: rheumatic fever 12/28/2015  . Prediabetes 12/27/2015  . Rising PSA level 02/21/2014  . Personal history of colonic polyps 02/17/2014  . 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 Hyperlipidemia 01/07/2009  . Asthma 06/04/2007  . Essential hypertension 09/26/2006   Amador Cunas, PT, DPT [facility] 07/20/2020, 4:34 PM  [place]. [place], [place], [address] Phone: [phone]   Fax:  [phone]  Name: Charles Tate MRN: [id number] Date of Birth: Oct 11, 1950

## 2020-07-27 ENCOUNTER — Encounter: Payer: Self-pay | Admitting: Physical Therapy

## 2020-07-27 ENCOUNTER — Ambulatory Visit: Payer: Medicare HMO | Admitting: Physical Therapy

## 2020-07-27 ENCOUNTER — Other Ambulatory Visit: Payer: Self-pay

## 2020-07-27 DIAGNOSIS — R600 Localized edema: Secondary | ICD-10-CM

## 2020-07-27 DIAGNOSIS — M6281 Muscle weakness (generalized): Secondary | ICD-10-CM | POA: Diagnosis not present

## 2020-07-27 DIAGNOSIS — M25611 Stiffness of right shoulder, not elsewhere classified: Secondary | ICD-10-CM

## 2020-07-27 DIAGNOSIS — M25511 Pain in right shoulder: Secondary | ICD-10-CM | POA: Diagnosis not present

## 2020-07-27 NOTE — Therapy (Signed)
[place]. [place], [place], [address] Phone: [phone]   Fax:  [phone]  Physical Therapy Treatment  Patient Details  Name: Charles Tate MRN: [id number] Date of Birth: [birth year] Referring Provider (PT): Esmeralda Arthur Date: 07/27/2020   PT End of Session - 07/27/20 1619    Visit Number 4    Date for PT Re-Evaluation 09/27/20    PT Start Time 3832    PT Stop Time 1610    PT Time Calculation (min) 40 min    Activity Tolerance Patient tolerated treatment well    Behavior During Therapy [facility] for tasks assessed/performed           Past Medical History:  Diagnosis Date  . Allergy   . Arthritis    left hip   . GERD (gastroesophageal reflux disease)    per patient, resolved. 03/15/20  . Heart murmur    adolescent rheumatic fever - developed murmur   . Hyperlipidemia   . Hypertension   . RAD (reactive airway disease)    with RTIs  . Tubular adenoma     Past Surgical History:  Procedure Laterality Date  . COLONOSCOPY    . colonoscopy with polypectomy  2013   Dr Ferdinand Lango, [facility]  . COLONOSCOPY WITH PROPOFOL N/A 07/01/2019   Procedure: COLONOSCOPY WITH PROPOFOL;  Surgeon: Rush Landmark Telford Nab., MD;  Location: [facility];  Service: Gastroenterology;  Laterality: N/A;  . COLONOSCOPY WITH PROPOFOL N/A 05/30/2020   Procedure: COLONOSCOPY WITH PROPOFOL;  Surgeon: Rush Landmark Telford Nab., MD;  [facility];  Service: Gastroenterology;  Laterality: N/A;  . ENDARTERECTOMY Right 04/11/2020   Procedure: Right Carotid Endarterectomy;  Surgeon: Elam Dutch, MD;  Location: [place];  Service: Vascular;  Laterality: Right;  . ENDOSCOPIC MUCOSAL RESECTION N/A 07/01/2019   Procedure: ENDOSCOPIC MUCOSAL RESECTION;  Surgeon: Rush Landmark Telford Nab., MD;  Location: [facility];  Service: Gastroenterology;  Laterality: N/A;  . ENDOSCOPIC MUCOSAL RESECTION N/A 05/30/2020   Procedure: ENDOSCOPIC MUCOSAL  RESECTION;  Surgeon: Rush Landmark Telford Nab., MD;  [facility];  Service: Gastroenterology;  Laterality: N/A;  . HEMOSTASIS CLIP PLACEMENT  07/01/2019   Procedure: HEMOSTASIS CLIP PLACEMENT;  Surgeon: Irving Copas., MD;  [facility];  Service: Gastroenterology;;  . POLYPECTOMY    . POLYPECTOMY  07/01/2019   Procedure: POLYPECTOMY;  Surgeon: Mansouraty, Telford Nab., MD;  Location: [facility];  Service: Gastroenterology;;  . POLYPECTOMY  05/30/2020   Procedure: POLYPECTOMY;  Surgeon: Irving Copas., MD;  Location: [facility];  Service: Gastroenterology;;  . Maryagnes Amos INJECTION  07/01/2019   Procedure: SUBMUCOSAL LIFTING INJECTION;  Surgeon: Irving Copas., MD;  Location: [facility];  Service: Gastroenterology;;  . Lia Foyer TATTOO INJECTION  07/01/2019   Procedure: SUBMUCOSAL TATTOO INJECTION;  Surgeon: Irving Copas., MD;  [facility];  Service: Gastroenterology;;  . Arnetha Courser TOOTH EXTRACTION      There were no vitals filed for this visit.   Subjective Assessment - 07/27/20 1531    Subjective Pt reports increased R shoulder pain particularl with abduction. Wants to continue PT until f/u appt but would like to be a little more conservative with tx today particularly with abduction.    Currently in Pain? No/denies   states 0/10 at rest and up to 8/10 with shoulder going out to the side   Pain Score 0-No pain  [place] Adult PT Treatment/Exercise - 07/27/20 0001      Shoulder Exercises: Standing   Flexion Right;20 reps    Shoulder Flexion Weight (lbs) 2    Flexion Limitations scaption x10 2#    Extension Both;20 reps    Theraband Level (Shoulder Extension) Level 1 (Yellow)    Row Both;20 reps    Theraband Level (Shoulder Row) Level 1 (Yellow)    Other Standing Exercises finger ladder flexion x10    Other Standing Exercises 1# weight bar against wall x10 with end  range flexion stretch (painfree range)      Shoulder Exercises: Pulleys   Flexion 2 minutes    Scaption 2 minutes    Scaption Limitations with cues to work closer to abduction within painfree ranges    ABduction --      Shoulder Exercises: ROM/Strengthening   UBE (Upper Arm Bike) L2 x 3 min each                    PT Short Term Goals - 07/13/20 0854      PT SHORT TERM GOAL #1   Title independent with initial HEP    Time 2    Period Weeks    Status Achieved    Target Date 07/19/20             PT Long Term Goals - 07/20/20 1633      PT LONG TERM GOAL #1   Title Pt will be I with advanced HEP    Time 8    Period Weeks    Status On-going      PT LONG TERM GOAL #2   Title Pt will report 50% reduction in R shoulder pain with activity    Time 8    Period Weeks    Status On-going      PT LONG TERM GOAL #3   Title Pt will demo R shoulder AROM equivalent to L shoulder    Time 8    Period Weeks    Status On-going      PT LONG TERM GOAL #4   Title Pt will report able to perform all ADLs with </=1/10 R shoulder pain    Time 8    Period Weeks    Status On-going                 Plan - 07/27/20 1619    Clinical Impression Statement Pt presents to clinic reporting increased R shoulder pain with movement into abduction. Education on importance of continuance of HEP and stretching in painfree ranges to avoid muscle atrophy and maintain/increase shoulder ROM with pt VU and agreement. Avoided ex's into abduction this rx. Pt tolerated all other ex's well with no reports of increased pain. Continue to progress to tolerance.    PT Treatment/Interventions ADLs/Self Care Home Management;Cryotherapy;Iontophoresis 4mg /ml Dexamethasone;Therapeutic activities;Therapeutic exercise;Neuromuscular re-education;Manual techniques;Patient/family education;Electrical Stimulation;Dry needling;Passive range of motion    PT Next Visit Plan progress HEP, gentle progression of TE,  painfree ROM, scap stab    Consulted and Agree with Plan of Care Patient           Patient will benefit from skilled therapeutic intervention in order to improve the following deficits and impairments:  Decreased range of motion,Increased muscle spasms,Impaired UE functional use,Pain,Hypomobility,Decreased strength,Postural dysfunction  Visit Diagnosis: Acute pain of right shoulder  Stiffness of right shoulder, not elsewhere classified  Muscle weakness (generalized)  Localized edema     Problem List Patient Active Problem List  Diagnosis Date Noted  . Right shoulder pain 06/27/2020  . Paronychia of right middle finger 06/27/2020  . S/P carotid endarterectomy, right 04/11/2020  . Serrated adenoma of colon 03/12/2020  . Aortic atherosclerosis ([place]) 02/03/2020  . Stenosis of right carotid artery 01/26/2020  . Branch retinal artery occlusion, right eye 01/19/2020  . Posterior vitreous detachment of both eyes 01/19/2020  . 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 Tear of MCL (medial collateral ligament) of knee, left, initial encounter 08/05/2017  . Gluteal tendinitis of left buttock 08/05/2017  . Left inguinal hernia 07/04/2017  . Allergic rhinitis 12/28/2015  . H/O: rheumatic fever 12/28/2015  . Prediabetes 12/27/2015  . Rising PSA level 02/21/2014  . Personal history of colonic polyps 02/17/2014  . 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 Hyperlipidemia 01/07/2009  . Asthma 06/04/2007  . Essential hypertension 09/26/2006   Amador Cunas, PT, DPT [facility] 07/27/2020, 4:24 PM  [place]. [place], [place], [address] Phone: [phone]   Fax:  [phone]  Name: Charles Tate MRN: [id number] Date of Birth: [birth year]

## 2020-08-03 ENCOUNTER — Other Ambulatory Visit: Payer: Self-pay

## 2020-08-03 ENCOUNTER — Encounter: Payer: Self-pay | Admitting: Physical Therapy

## 2020-08-03 ENCOUNTER — Ambulatory Visit: Payer: Medicare HMO | Attending: Family Medicine | Admitting: Physical Therapy

## 2020-08-03 ENCOUNTER — Ambulatory Visit: Payer: Medicare HMO | Admitting: Physical Therapy

## 2020-08-03 DIAGNOSIS — R600 Localized edema: Secondary | ICD-10-CM | POA: Diagnosis not present

## 2020-08-03 DIAGNOSIS — M6281 Muscle weakness (generalized): Secondary | ICD-10-CM

## 2020-08-03 DIAGNOSIS — M25611 Stiffness of right shoulder, not elsewhere classified: Secondary | ICD-10-CM | POA: Diagnosis not present

## 2020-08-03 DIAGNOSIS — M25511 Pain in right shoulder: Secondary | ICD-10-CM

## 2020-08-03 NOTE — Therapy (Signed)
[place]. [place], [place], [address] Phone: [phone]   Fax:  [phone]  Physical Therapy Treatment  Patient Details  Name: Charles Tate MRN: [id number] Date of Birth: 1950/11/03 Referring Provider (PT): Esmeralda Arthur Date: 08/03/2020   PT End of Session - 08/03/20 1602    Visit Number 5    Date for PT Re-Evaluation 09/27/20    PT Start Time 6962    PT Stop Time 1600    PT Time Calculation (min) 45 min    Activity Tolerance Patient tolerated treatment well    Behavior During Therapy [facility] for tasks assessed/performed           Past Medical History:  Diagnosis Date  . Allergy   . Arthritis    left hip   . GERD (gastroesophageal reflux disease)    per patient, resolved. 03/15/20  . Heart murmur    adolescent rheumatic fever - developed murmur   . Hyperlipidemia   . Hypertension   . RAD (reactive airway disease)    with RTIs  . Tubular adenoma     Past Surgical History:  Procedure Laterality Date  . COLONOSCOPY    . colonoscopy with polypectomy  2013   Dr Ferdinand Lango, [facility]  . COLONOSCOPY WITH PROPOFOL N/A 07/01/2019   Procedure: COLONOSCOPY WITH PROPOFOL;  Surgeon: Rush Landmark Telford Nab., MD;  Location: [facility];  Service: Gastroenterology;  Laterality: N/A;  . COLONOSCOPY WITH PROPOFOL N/A 05/30/2020   Procedure: COLONOSCOPY WITH PROPOFOL;  Surgeon: Rush Landmark Telford Nab., MD;  [facility];  Service: Gastroenterology;  Laterality: N/A;  . ENDARTERECTOMY Right 04/11/2020   Procedure: Right Carotid Endarterectomy;  Surgeon: Elam Dutch, MD;  Location: [place];  Service: Vascular;  Laterality: Right;  . ENDOSCOPIC MUCOSAL RESECTION N/A 07/01/2019   Procedure: ENDOSCOPIC MUCOSAL RESECTION;  Surgeon: Rush Landmark Telford Nab., MD;  Location: [facility];  Service: Gastroenterology;  Laterality: N/A;  . ENDOSCOPIC MUCOSAL RESECTION N/A 05/30/2020   Procedure: ENDOSCOPIC MUCOSAL  RESECTION;  Surgeon: Rush Landmark Telford Nab., MD;  [facility];  Service: Gastroenterology;  Laterality: N/A;  . HEMOSTASIS CLIP PLACEMENT  07/01/2019   Procedure: HEMOSTASIS CLIP PLACEMENT;  Surgeon: Irving Copas., MD;  [facility];  Service: Gastroenterology;;  . POLYPECTOMY    . POLYPECTOMY  07/01/2019   Procedure: POLYPECTOMY;  Surgeon: Mansouraty, Telford Nab., MD;  Location: [facility];  Service: Gastroenterology;;  . POLYPECTOMY  05/30/2020   Procedure: POLYPECTOMY;  Surgeon: Irving Copas., MD;  Location: [facility];  Service: Gastroenterology;;  . Maryagnes Amos INJECTION  07/01/2019   Procedure: SUBMUCOSAL LIFTING INJECTION;  Surgeon: Irving Copas., MD;  Location: [facility];  Service: Gastroenterology;;  . Lia Foyer TATTOO INJECTION  07/01/2019   Procedure: SUBMUCOSAL TATTOO INJECTION;  Surgeon: Irving Copas., MD;  [facility];  Service: Gastroenterology;;  . Arnetha Courser TOOTH EXTRACTION      There were no vitals filed for this visit.   Subjective Assessment - 08/03/20 1515    Subjective Doing ok, only had one episode last week    Currently in Pain? No/denies                             [facility] Adult PT Treatment/Exercise - 08/03/20 0001      Shoulder Exercises: Standing   External Rotation Left;20 reps;Theraband    Theraband Level (Shoulder External Rotation) Level 1 (Yellow)  Internal Rotation Strengthening;Right;20 reps;Theraband    Theraband Level (Shoulder Internal Rotation) Level 1 (Yellow)    Other Standing Exercises AAROM flex, ext, IR up back x10      Shoulder Exercises: ROM/Strengthening   UBE (Upper Arm Bike) L2 x 3 min each    Other ROM/Strengthening Exercises Row & Lats 20lb 2x10      Manual Therapy   Manual Therapy Joint mobilization;Passive ROM    Passive ROM R shoulder PROM all directions within painfree ranges                    PT Short Term Goals  - 07/13/20 0854      PT SHORT TERM GOAL #1   Title independent with initial HEP    Time 2    Period Weeks    Status Achieved    Target Date 07/19/20             PT Long Term Goals - 07/20/20 1633      PT LONG TERM GOAL #1   Title Pt will be I with advanced HEP    Time 8    Period Weeks    Status On-going      PT LONG TERM GOAL #2   Title Pt will report 50% reduction in R shoulder pain with activity    Time 8    Period Weeks    Status On-going      PT LONG TERM GOAL #3   Title Pt will demo R shoulder AROM equivalent to L shoulder    Time 8    Period Weeks    Status On-going      PT LONG TERM GOAL #4   Title Pt will report able to perform all ADLs with </=1/10 R shoulder pain    Time 8    Period Weeks    Status On-going                 Plan - 08/03/20 1605    Clinical Impression Statement Pt did well overall today. He was able to progress with more strengthening interventions without issues. Reported some initial tightness with lat pull downs, informed pt some stretching was ok, he stated that his shoulder started to loosen up as reps progressed. Tactile cues to R elbow needed to keep RUE in the correct position with external and internal rotation. Some pain at the end range of passive abduction.    Examination-Activity Limitations Reach Overhead;Lift    Examination-Participation Restrictions Community Activity;Interpersonal Relationship    Stability/Clinical Decision Making Stable/Uncomplicated    Rehab Potential Good    PT Frequency 1x / week    PT Duration 8 weeks    PT Treatment/Interventions ADLs/Self Care Home Management;Cryotherapy;Iontophoresis 4mg /ml Dexamethasone;Therapeutic activities;Therapeutic exercise;Neuromuscular re-education;Manual techniques;Patient/family education;Electrical Stimulation;Dry needling;Passive range of motion    PT Next Visit Plan gentle progression of TE, painfree ROM, scap stab           Patient will benefit from  skilled therapeutic intervention in order to improve the following deficits and impairments:  Decreased range of motion,Increased muscle spasms,Impaired UE functional use,Pain,Hypomobility,Decreased strength,Postural dysfunction  Visit Diagnosis: Stiffness of right shoulder, not elsewhere classified  Muscle weakness (generalized)  Localized edema  Acute pain of right shoulder     Problem List Patient Active Problem List   Diagnosis Date Noted  . Right shoulder pain 06/27/2020  . Paronychia of right middle finger 06/27/2020  . S/P carotid endarterectomy, right 04/11/2020  . Serrated adenoma of colon  03/12/2020  . Aortic atherosclerosis ([place]) 02/03/2020  . Stenosis of right carotid artery 01/26/2020  . Branch retinal artery occlusion, right eye 01/19/2020  . Posterior vitreous detachment of both eyes 01/19/2020  . 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 Tear of MCL (medial collateral ligament) of knee, left, initial encounter 08/05/2017  . Gluteal tendinitis of left buttock 08/05/2017  . Left inguinal hernia 07/04/2017  . Allergic rhinitis 12/28/2015  . H/O: rheumatic fever 12/28/2015  . Prediabetes 12/27/2015  . Rising PSA level 02/21/2014  . Personal history of colonic polyps 02/17/2014  . 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 Hyperlipidemia 01/07/2009  . Asthma 06/04/2007  . Essential hypertension 09/26/2006    Scot Jun, PTA 08/03/2020, 4:14 PM  [place]. [place], [place], [address] Phone: [phone]   Fax:  [phone]  Name: THANIEL COLUCCIO MRN: [id number] Date of Birth: [birth year]

## 2020-08-10 ENCOUNTER — Other Ambulatory Visit: Payer: Self-pay

## 2020-08-10 ENCOUNTER — Ambulatory Visit: Payer: Medicare HMO | Admitting: Physical Therapy

## 2020-08-10 ENCOUNTER — Encounter: Payer: Self-pay | Admitting: Physical Therapy

## 2020-08-10 DIAGNOSIS — M25511 Pain in right shoulder: Secondary | ICD-10-CM

## 2020-08-10 DIAGNOSIS — R600 Localized edema: Secondary | ICD-10-CM | POA: Diagnosis not present

## 2020-08-10 DIAGNOSIS — M6281 Muscle weakness (generalized): Secondary | ICD-10-CM | POA: Diagnosis not present

## 2020-08-10 DIAGNOSIS — M25611 Stiffness of right shoulder, not elsewhere classified: Secondary | ICD-10-CM | POA: Diagnosis not present

## 2020-08-10 NOTE — Therapy (Signed)
[place] [place], [place], [address] Phone: [phone]   Fax:  [phone]  Physical Therapy Treatment  Patient Details  Name: Charles Tate MRN: [id number] Date of Birth: Jul 03, 1950 Referring Provider (PT): Esmeralda Arthur Date: 08/10/2020   PT End of Session - 08/10/20 1423    Visit Number 6    Date for PT Re-Evaluation 09/27/20    PT Start Time 1341    PT Stop Time 1423    PT Time Calculation (min) 42 min    Activity Tolerance Patient tolerated treatment well    Behavior During Therapy [facility] for tasks assessed/performed           Past Medical History:  Diagnosis Date  . Allergy   . Arthritis    left hip   . GERD (gastroesophageal reflux disease)    per patient, resolved. 03/15/20  . Heart murmur    adolescent rheumatic fever - developed murmur   . Hyperlipidemia   . Hypertension   . RAD (reactive airway disease)    with RTIs  . Tubular adenoma     Past Surgical History:  Procedure Laterality Date  . COLONOSCOPY    . colonoscopy with polypectomy  2013   Dr Ferdinand Lango, [facility]  . COLONOSCOPY WITH PROPOFOL N/A 07/01/2019   Procedure: COLONOSCOPY WITH PROPOFOL;  Surgeon: Rush Landmark Telford Nab., MD;  Location: [facility];  Service: Gastroenterology;  Laterality: N/A;  . COLONOSCOPY WITH PROPOFOL N/A 05/30/2020   Procedure: COLONOSCOPY WITH PROPOFOL;  Surgeon: Rush Landmark Telford Nab., MD;  Location: [facility];  Service: Gastroenterology;  Laterality: N/A;  . ENDARTERECTOMY Right 04/11/2020   Procedure: Right Carotid Endarterectomy;  Surgeon: Elam Dutch, MD;  Location: [place];  Service: Vascular;  Laterality: Right;  . ENDOSCOPIC MUCOSAL RESECTION N/A 07/01/2019   Procedure: ENDOSCOPIC MUCOSAL RESECTION;  Surgeon: Rush Landmark Telford Nab., MD;  Location: [facility];  Service: Gastroenterology;  Laterality: N/A;  . ENDOSCOPIC MUCOSAL RESECTION N/A 05/30/2020   Procedure: ENDOSCOPIC MUCOSAL  RESECTION;  Surgeon: Rush Landmark Telford Nab., MD;  [facility]: [facility];  Service: Gastroenterology;  Laterality: N/A;  . HEMOSTASIS CLIP PLACEMENT  07/01/2019   Procedure: HEMOSTASIS CLIP PLACEMENT;  Surgeon: Irving Copas., MD;  [facility]: [facility];  Service: Gastroenterology;;  . POLYPECTOMY    . POLYPECTOMY  07/01/2019   Procedure: POLYPECTOMY;  Surgeon: Mansouraty, Telford Nab., MD;  Location: [facility];  Service: Gastroenterology;;  . POLYPECTOMY  05/30/2020   Procedure: POLYPECTOMY;  Surgeon: Irving Copas., MD;  Location: [facility];  Service: Gastroenterology;;  . Maryagnes Amos INJECTION  07/01/2019   Procedure: SUBMUCOSAL LIFTING INJECTION;  Surgeon: Irving Copas., MD;  Location: [facility];  Service: Gastroenterology;;  . Lia Foyer TATTOO INJECTION  07/01/2019   Procedure: SUBMUCOSAL TATTOO INJECTION;  Surgeon: Irving Copas., MD;  [facility];  Service: Gastroenterology;;  . Arnetha Courser TOOTH EXTRACTION      There were no vitals filed for this visit.   Subjective Assessment - 08/10/20 1345    Subjective Pt states shoulder doing much better with reduced pain and increased ROM    Currently in Pain? No/denies              [facility] PT Assessment - 08/10/20 0001      Observation/Other Assessments   Focus on Therapeutic Outcomes (FOTO)  66%      AROM   Right Shoulder Flexion 135 Degrees    Right Shoulder ABduction 115 Degrees  [place] [facility] PT Treatment/Exercise - 08/10/20 0001      Shoulder Exercises: Standing   External Rotation Right;20 reps    Theraband Level (Shoulder External Rotation) Level 1 (Yellow)    Internal Rotation Right;20 reps    Theraband Level (Shoulder Internal Rotation) Level 1 (Yellow)    Flexion 20 reps;Both    Shoulder Flexion Weight (lbs) 2    ABduction Both;20 reps    Shoulder ABduction Weight (lbs) 2    ABduction Limitations partial ROM    Other  Standing Exercises finger ladder flexion/abduction x10    Other Standing Exercises AAROM flex, ext, IR up back x10      Shoulder Exercises: ROM/Strengthening   UBE (Upper Arm Bike) L2 x 3 min each    Other ROM/Strengthening Exercises Row & Lats 20lb 2x10      Shoulder Exercises: Stretch   Wall Stretch - Flexion 5 reps;10 seconds    Wall Stretch - ABduction 10 seconds;2 reps   discontinued d/t cramping                   PT Short Term Goals - 07/13/20 0854      PT SHORT TERM GOAL #1   Title independent with initial HEP    Time 2    Period Weeks    Status Achieved    Target Date 07/19/20             PT Long Term Goals - 08/10/20 1425      PT LONG TERM GOAL #1   Title Pt will be I with advanced HEP    Time 8    Period Weeks    Status Partially Met      PT LONG TERM GOAL #2   Title Pt will report 50% reduction in R shoulder pain with activity    Time 8    Period Weeks    Status Achieved      PT LONG TERM GOAL #3   Title Pt will demo R shoulder AROM equivalent to L shoulder    Time 8    Period Weeks    Status Partially Met      PT LONG TERM GOAL #4   Title Pt will report able to perform all ADLs with </=1/10 R shoulder pain    Time 8    Period Weeks    Status Partially Met                 Plan - 08/10/20 1423    Clinical Impression Statement Pt demos progress toward LTGs. Demos improvement with shoulder flexion; shoulder abduction still limited in ROM but improved strength with shoulder abduction. Pt would like to hold off on making more PT visits until after f/u with MD Monday, 6/13. Discussed potential for continuation with a transition into machine interventions he can complete at [facility].    PT Treatment/Interventions ADLs/Self Care Home Management;Cryotherapy;Iontophoresis 59m/ml Dexamethasone;Therapeutic activities;Therapeutic exercise;Neuromuscular re-education;Manual techniques;Patient/family education;Electrical Stimulation;Dry  needling;Passive range of motion    PT Next Visit Plan gentle progression of TE, painfree ROM, scap stab    Consulted and Agree with Plan of Care Patient           Patient will benefit from skilled therapeutic intervention in order to improve the following deficits and impairments:  Decreased range of motion,Increased muscle spasms,Impaired UE functional use,Pain,Hypomobility,Decreased strength,Postural dysfunction  Visit Diagnosis: Stiffness of right shoulder, not elsewhere classified  Muscle weakness (generalized)  Localized edema  Acute pain of right shoulder  Problem List Patient Active Problem List   Diagnosis Date Noted  . Right shoulder pain 06/27/2020  . Paronychia of right middle finger 06/27/2020  . S/P carotid endarterectomy, right 04/11/2020  . Serrated adenoma of colon 03/12/2020  . Aortic atherosclerosis ([place]) 02/03/2020  . Stenosis of right carotid artery 01/26/2020  . Branch retinal artery occlusion, right eye 01/19/2020  . Posterior vitreous detachment of both eyes 01/19/2020  . 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 Tear of MCL (medial collateral ligament) of knee, left, initial encounter 08/05/2017  . Gluteal tendinitis of left buttock 08/05/2017  . Left inguinal hernia 07/04/2017  . Allergic rhinitis 12/28/2015  . H/O: rheumatic fever 12/28/2015  . Prediabetes 12/27/2015  . Rising PSA level 02/21/2014  . Personal history of colonic polyps 02/17/2014  . 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 Hyperlipidemia 01/07/2009  . Asthma 06/04/2007  . Essential hypertension 09/26/2006   Amador Cunas, PT, DPT Donald Prose Teal Raben 08/10/2020, 2:27 PM  [facility]. [place], [place], [address] Phone: [phone]   Fax:  [phone]  Name: Charles Tate MRN: [id number] Date of Birth: 1950-05-20

## 2020-08-12 NOTE — Progress Notes (Deleted)
[place] Phone: [phone] Subjective:    I'm seeing this patient by the request  of:  [name], MD  CC:   [id number]  06/27/2020 Patient seems to have a partial rotator cuff tear but decent strength Does have AC arthritis as well Xr pending PT referral HEP with ATC given RTC in 6 weeks and if worsening consider injection, I  Update 08/15/2020 Charles Tate is a 70 y.o. male coming in with complaint of R shoulder pain. Patient has been going to PT at [facility]. Patient states   Xray r shoulder 06/27/2020 IMPRESSION: No acute osseous abnormality.         Past Medical History:  Diagnosis Date   Allergy    Arthritis    left hip    GERD (gastroesophageal reflux disease)    per patient, resolved. 03/15/20   Heart murmur    adolescent rheumatic fever - developed murmur    Hyperlipidemia    Hypertension    RAD (reactive airway disease)    with RTIs   Tubular adenoma    Past Surgical History:  Procedure Laterality Date   COLONOSCOPY     colonoscopy with polypectomy  2013   Dr Ferdinand Lango, [place]   COLONOSCOPY WITH PROPOFOL N/A 07/01/2019   Procedure: COLONOSCOPY WITH PROPOFOL;  Surgeon: Irving Copas., MD;  Location: [facility];  Service: Gastroenterology;  Laterality: N/A;   COLONOSCOPY WITH PROPOFOL N/A 05/30/2020   Procedure: COLONOSCOPY WITH PROPOFOL;  Surgeon: Rush Landmark Telford Nab., MD;  [facility];  Service: Gastroenterology;  Laterality: N/A;   ENDARTERECTOMY Right 04/11/2020   Procedure: Right Carotid Endarterectomy;  Surgeon: Elam Dutch, MD;  Location: [facility] OR;  Service: Vascular;  Laterality: Right;   ENDOSCOPIC MUCOSAL RESECTION N/A 07/01/2019   Procedure: ENDOSCOPIC MUCOSAL RESECTION;  Surgeon: Rush Landmark Telford Nab., MD;  Location: [facility];  Service: Gastroenterology;  Laterality: N/A;   ENDOSCOPIC MUCOSAL RESECTION N/A 05/30/2020    Procedure: ENDOSCOPIC MUCOSAL RESECTION;  Surgeon: Rush Landmark Telford Nab., MD;  [facility]: [facility];  Service: Gastroenterology;  Laterality: N/A;   HEMOSTASIS CLIP PLACEMENT  07/01/2019   Procedure: HEMOSTASIS CLIP PLACEMENT;  Surgeon: Irving Copas., MD;  Location: [facility];  Service: Gastroenterology;;   POLYPECTOMY     POLYPECTOMY  07/01/2019   Procedure: POLYPECTOMY;  Surgeon: Irving Copas., MD;  Location: [facility];  Service: Gastroenterology;;   POLYPECTOMY  05/30/2020   Procedure: POLYPECTOMY;  Surgeon: Irving Copas., MD;  Location: [facility];  Service: Gastroenterology;;   SUBMUCOSAL LIFTING INJECTION  07/01/2019   Procedure: SUBMUCOSAL LIFTING INJECTION;  Surgeon: Irving Copas., MD;  Location: [facility];  Service: Gastroenterology;;   SUBMUCOSAL TATTOO INJECTION  07/01/2019   Procedure: SUBMUCOSAL TATTOO INJECTION;  Surgeon: Irving Copas., MD;  Location: [facility];  Service: Gastroenterology;;   WISDOM TOOTH EXTRACTION     Social History   Socioeconomic History   Marital status: Married    Spouse name: [name]   Number of children: 2   Years of education: Not on file   Highest education level: Not on file  Occupational History   Occupation: self employed   Tobacco Use   Smoking status: Former    Pack years: 0.00   Smokeless tobacco: Never   Tobacco comments:    intermittent, short term smoker as a teen. Some second hand smoke in 29s & 30s  Vaping Use   Vaping Use: Never used  Substance and Sexual Activity   Alcohol use: Yes    Comment:   very rarely   Drug use: No   Sexual activity: Not on file  Other Topics Concern   Not on file  Social History Narrative   Exercise: none regularly   Social Determinants of Health   Financial Resource Strain: Low Risk    Difficulty of Paying Living Expenses: Not hard at all  Food Insecurity: No Food Insecurity   Worried About Charity fundraiser in the Last Year:  Never true   Ran Out of Food in the Last Year: Never true  Transportation Needs: No Transportation Needs   Lack of Transportation (Medical): No   Lack of Transportation (Non-Medical): No  Physical Activity: Not on file  Stress: No Stress Concern Present   Feeling of Stress : Not at all  Social Connections: Not on file   Allergies  Allergen Reactions   Ciprofloxacin Hcl     Numbness, joint pain   Cefdinir Diarrhea    Fever, chills, stomach cramps    Lisinopril Cough   Family History  Problem Relation Age of Onset   Arthritis Mother    Hyperlipidemia Mother    Lupus Mother    Heart attack Father 43   Hypertension Sister    Heart attack Paternal Grandmother 29   Diabetes Paternal Aunt    Stroke Maternal Grandfather 59   Cancer Paternal Grandfather        ? stomach; in 87s   Colon cancer Paternal Grandfather    Colon polyps Neg Hx    Esophageal cancer Neg Hx    Rectal cancer Neg Hx    Stomach cancer Neg Hx    Inflammatory bowel disease Neg Hx    Liver disease Neg Hx    Pancreatic cancer Neg Hx      Current Outpatient Medications (Cardiovascular):    atorvastatin (LIPITOR) 40 MG tablet, Take 1 tablet (40 mg total) by mouth daily.   telmisartan-hydrochlorothiazide (MICARDIS HCT) 80-25 MG tablet, TAKE 1 TABLET ONCE DAILY (Patient taking differently: Take 1 tablet by mouth daily. TAKE 1 TABLET ONCE DAILY)  Current Outpatient Medications (Respiratory):    fluticasone (FLONASE) 50 MCG/ACT nasal spray, SPRAY 2 SPRAYS INTO EACH NOSTRIL EVERY DAY (Patient taking differently: Place 1 spray into both nostrils daily as needed for allergies.)  Current Outpatient Medications (Analgesics):    aspirin EC 81 MG tablet, Take 81 mg by mouth daily. Swallow whole.   Current Outpatient Medications (Other):    Colostrum 500 MG CAPS, Take 500 mg by mouth daily.   Misc Natural Products (SUPER GREENS PO), Take 1 Scoop by mouth daily.    SUTAB [phone] MG TABS, SMARTSIG:24 Tablet(s) By  Mouth As Directed   Reviewed prior external information including notes and imaging from  primary care provider As well as notes that were available from care everywhere and other healthcare systems.  Past medical history, social, surgical and family history all reviewed in electronic medical record.  No pertanent information unless stated regarding to the chief complaint.   Review of Systems:  No headache, visual changes, nausea, vomiting, diarrhea, constipation, dizziness, abdominal pain, skin rash, fevers, chills, night sweats, weight loss, swollen lymph nodes, body aches, joint swelling, chest pain, shortness of breath, mood changes. POSITIVE muscle aches  Objective  There were no vitals taken for this visit.   General: No apparent distress alert and oriented x3 mood and affect normal, dressed appropriately.  HEENT: Pupils equal, extraocular movements intact  Respiratory: Patient's speak in full sentences and does not appear short of breath  Cardiovascular: No lower extremity edema, non tender, no erythema  Gait normal with good balance and coordination.  MSK:  Non tender with full range of motion and good stability and symmetric strength and tone of shoulders, elbows, wrist, hip, knee and ankles bilaterally.     Impression and Recommendations:     The above documentation has been reviewed and is accurate and complete Jacqualin Combes

## 2020-08-15 ENCOUNTER — Ambulatory Visit: Payer: Medicare HMO | Admitting: Family Medicine

## 2020-08-18 NOTE — Progress Notes (Signed)
[place] [address] [place] Phone: [phone] Subjective:   I Charles Tate am serving as a Education administrator for Dr. Hulan Saas.  This visit occurred during the SARS-CoV-2 public health emergency.  Safety protocols were in place, including screening questions prior to the visit, additional usage of staff PPE, and extensive cleaning of exam room while observing appropriate contact time as indicated for disinfecting solutions.   I'm seeing this patient by the request  of:  Burns, Stacy J, MD  CC: right shoulder pain   [id number]  06/27/2020 Patient seems to have a partial rotator cuff tear but decent strength Does have AC arthritis as well Xr pending PT referral HEP with ATC given RTC in 6 weeks and if worsening consider injection, I  Update 6/20/202 Charles Tate is a 70 y.o. male coming in with complaint of R shoulder pain. Patient states he is doing better. Hasn't pushed the shoulder. Did PT for about 6 weeks. Believes that during the 6 weeks he felt he was still experiencing pain. Changed therapy exercises due to radiating pain down the lateral shoulder. Stretching helps the shoulder. Past 2 weeks he has been better.       Past Medical History:  Diagnosis Date   Allergy    Arthritis    left hip    GERD (gastroesophageal reflux disease)    per patient, resolved. 03/15/20   Heart murmur    adolescent rheumatic fever - developed murmur    Hyperlipidemia    Hypertension    RAD (reactive airway disease)    with RTIs   Tubular adenoma    Past Surgical History:  Procedure Laterality Date   COLONOSCOPY     colonoscopy with polypectomy  2013   Dr Ferdinand Lango, [place]   COLONOSCOPY WITH PROPOFOL N/A 07/01/2019   Procedure: COLONOSCOPY WITH PROPOFOL;  Surgeon: Irving Copas., MD;  Location: [facility];  Service: Gastroenterology;  Laterality: N/A;   COLONOSCOPY WITH PROPOFOL N/A 05/30/2020   Procedure: COLONOSCOPY  WITH PROPOFOL;  Surgeon: Rush Landmark Telford Nab., MD;  Location: [facility];  Service: Gastroenterology;  Laterality: N/A;   ENDARTERECTOMY Right 04/11/2020   Procedure: Right Carotid Endarterectomy;  Surgeon: Elam Dutch, MD;  Location: [facility] OR;  Service: Vascular;  Laterality: Right;   ENDOSCOPIC MUCOSAL RESECTION N/A 07/01/2019   Procedure: ENDOSCOPIC MUCOSAL RESECTION;  Surgeon: Rush Landmark Telford Nab., MD;  Location: [facility];  Service: Gastroenterology;  Laterality: N/A;   ENDOSCOPIC MUCOSAL RESECTION N/A 05/30/2020   Procedure: ENDOSCOPIC MUCOSAL RESECTION;  Surgeon: Rush Landmark Telford Nab., MD;  [facility]: [facility];  Service: Gastroenterology;  Laterality: N/A;   HEMOSTASIS CLIP PLACEMENT  07/01/2019   Procedure: HEMOSTASIS CLIP PLACEMENT;  Surgeon: Irving Copas., MD;  Location: [facility];  Service: Gastroenterology;;   POLYPECTOMY     POLYPECTOMY  07/01/2019   Procedure: POLYPECTOMY;  Surgeon: Irving Copas., MD;  Location: [facility];  Service: Gastroenterology;;   POLYPECTOMY  05/30/2020   Procedure: POLYPECTOMY;  Surgeon: Irving Copas., MD;  Location: [facility];  Service: Gastroenterology;;   Maryagnes Amos INJECTION  07/01/2019   Procedure: SUBMUCOSAL LIFTING INJECTION;  Surgeon: Irving Copas., MD;  Location: [facility];  Service: Gastroenterology;;   SUBMUCOSAL TATTOO INJECTION  07/01/2019   Procedure: SUBMUCOSAL TATTOO INJECTION;  Surgeon: Irving Copas., MD;  Location: [facility];  Service: Gastroenterology;;   WISDOM TOOTH EXTRACTION     Social History   Socioeconomic History   Marital status: Married  Spouse name: [name]   Number of children: 2   Years of education: Not on file   Highest education level: Not on file  Occupational History   Occupation: self employed   Tobacco Use   Smoking status: Former    Pack years: 0.00   Smokeless tobacco: Never   Tobacco comments:    intermittent, short  term smoker as a teen. Some second hand smoke in 57s & 30s  Vaping Use   Vaping Use: Never used  Substance and Sexual Activity   Alcohol use: Yes    Comment:   very rarely   Drug use: No   Sexual activity: Not on file  Other Topics Concern   Not on file  Social History Narrative   Exercise: none regularly   Social Determinants of Health   Financial Resource Strain: Low Risk    Difficulty of Paying Living Expenses: Not hard at all  Food Insecurity: No Food Insecurity   Worried About Charity fundraiser in the Last Year: Never true   Ran Out of Food in the Last Year: Never true  Transportation Needs: No Transportation Needs   Lack of Transportation (Medical): No   Lack of Transportation (Non-Medical): No  Physical Activity: Not on file  Stress: No Stress Concern Present   Feeling of Stress : Not at all  Social Connections: Not on file   Allergies  Allergen Reactions   Ciprofloxacin Hcl     Numbness, joint pain   Cefdinir Diarrhea    Fever, chills, stomach cramps    Lisinopril Cough   Family History  Problem Relation Age of Onset   Arthritis Mother    Hyperlipidemia Mother    Lupus Mother    Heart attack Father 12   Hypertension Sister    Heart attack Paternal Grandmother 3   Diabetes Paternal Aunt    Stroke Maternal Grandfather 82   Cancer Paternal Grandfather        ? stomach; in 22s   Colon cancer Paternal Grandfather    Colon polyps Neg Hx    Esophageal cancer Neg Hx    Rectal cancer Neg Hx    Stomach cancer Neg Hx    Inflammatory bowel disease Neg Hx    Liver disease Neg Hx    Pancreatic cancer Neg Hx      Current Outpatient Medications (Cardiovascular):    atorvastatin (LIPITOR) 40 MG tablet, Take 1 tablet (40 mg total) by mouth daily.   telmisartan-hydrochlorothiazide (MICARDIS HCT) 80-25 MG tablet, TAKE 1 TABLET ONCE DAILY (Patient taking differently: Take 1 tablet by mouth daily. TAKE 1 TABLET ONCE DAILY)  Current Outpatient Medications  (Respiratory):    fluticasone (FLONASE) 50 MCG/ACT nasal spray, SPRAY 2 SPRAYS INTO EACH NOSTRIL EVERY DAY (Patient taking differently: Place 1 spray into both nostrils daily as needed for allergies.)  Current Outpatient Medications (Analgesics):    aspirin EC 81 MG tablet, Take 81 mg by mouth daily. Swallow whole.   Current Outpatient Medications (Other):    Colostrum 500 MG CAPS, Take 500 mg by mouth daily.   Misc Natural Products (SUPER GREENS PO), Take 1 Scoop by mouth daily.    SUTAB [phone] MG TABS, SMARTSIG:24 Tablet(s) By Mouth As Directed   Reviewed prior external information including notes and imaging from  primary care provider As well as notes that were available from care everywhere and other healthcare systems.  Past medical history, social, surgical and family history all reviewed in electronic medical record.  No  pertanent information unless stated regarding to the chief complaint.   Review of Systems:  No headache, visual changes, nausea, vomiting, diarrhea, constipation, dizziness, abdominal pain, skin rash, fevers, chills, night sweats, weight loss, swollen lymph nodes, body aches, joint swelling, chest pain, shortness of breath, mood changes. P  Objective  Blood pressure (!) 142/80, pulse 64, height 6\' 3"  (1.905 m), weight 205 lb (93 kg), SpO2 99 %.   General: No apparent distress alert and oriented x3 mood and affect normal, dressed appropriately.  HEENT: Pupils equal, extraocular movements intact  Respiratory: Patient's speak in full sentences and does not appear short of breath  Cardiovascular: No lower extremity edema, non tender, no erythema  Gait normal with good balance and coordination.  MSK: Right shoulder exam shows the patient does have some improvement noted in the strength of the rotator cuff.  4+ out of 5 compared to the contralateral side. Patient does still have positive impingement with crossover noted.  Patient does have some pain though and  some limited external rotation of the shoulder.  Limited musculoskeletal ultrasound was performed and interpreted by [name]  Limited ultrasound of patient's right shoulder shows that patient has significant decrease in hypoechoic changes.  Still some very mild degenerative changes noted of the lateral supraspinatus.  No significant retraction noted.  Patient does have moderate arthritic changes of the acromioclavicular joint with hypoechoic changes. Impression: Continued mild irregularity noted of the supraspinatus and arthritic changes of the acromioclavicular joint    Impression and Recommendations:    The above documentation has been reviewed and is accurate and complete [name], DO

## 2020-08-22 ENCOUNTER — Encounter: Payer: Self-pay | Admitting: Family Medicine

## 2020-08-22 ENCOUNTER — Ambulatory Visit: Payer: Self-pay

## 2020-08-22 ENCOUNTER — Other Ambulatory Visit: Payer: Self-pay

## 2020-08-22 ENCOUNTER — Ambulatory Visit: Payer: Medicare HMO | Admitting: Family Medicine

## 2020-08-22 VITALS — BP 142/80 | HR 64 | Ht 75.0 in | Wt 205.0 lb

## 2020-08-22 DIAGNOSIS — M25511 Pain in right shoulder: Secondary | ICD-10-CM

## 2020-08-22 DIAGNOSIS — G8929 Other chronic pain: Secondary | ICD-10-CM | POA: Diagnosis not present

## 2020-08-22 NOTE — Assessment & Plan Note (Signed)
Patient will decrease in the hypoechoic changes that was noted previously.  Discussed icing regimen and home exercises, discussed which activities to do which wants to avoid.  Follow-up with me again 6 weeks.  If continuing to have pain would consider injection in the acromioclavicular joint patient is already done physical therapy.  Worsening weakness or decreased range of motion would encourage advanced imaging.

## 2020-08-22 NOTE — Patient Instructions (Addendum)
Good to see you Continue exercises Ice at night See me again in 6-8 weeks

## 2020-09-14 ENCOUNTER — Telehealth: Payer: Self-pay | Admitting: Pharmacist

## 2020-09-19 NOTE — Progress Notes (Signed)
    Chronic Care Management Pharmacy Assistant   Name: COLLIS THEDE  MRN: [id number] DOB: 1950/04/18   Reason for Encounter: Disease State   Conditions to be addressed/monitored: HTN   Medications: Outpatient Encounter Medications as of 09/14/2020  Medication Sig Note   aspirin EC 81 MG tablet Take 81 mg by mouth daily. Swallow whole.    atorvastatin (LIPITOR) 40 MG tablet Take 1 tablet (40 mg total) by mouth daily.    Colostrum 500 MG CAPS Take 500 mg by mouth daily. 05/16/2020: Pt ran out, needs to get more   fluticasone (FLONASE) 50 MCG/ACT nasal spray SPRAY 2 SPRAYS INTO EACH NOSTRIL EVERY DAY (Patient taking differently: Place 1 spray into both nostrils daily as needed for allergies.)    Misc Natural Products (SUPER GREENS PO) Take 1 Scoop by mouth daily.     SUTAB [phone] MG TABS SMARTSIG:24 Tablet(s) By Mouth As Directed    telmisartan-hydrochlorothiazide (MICARDIS HCT) 80-25 MG tablet TAKE 1 TABLET ONCE DAILY (Patient taking differently: Take 1 tablet by mouth daily. TAKE 1 TABLET ONCE DAILY)    No facility-administered encounter medications on file as of 09/14/2020.   Pharmacist Review  Made 3 attempts to reach patient for hypertension adherence call. Left message for patient to return call. Unable to reach patient.  [place] Pharmacist Assistant [phone]   Time spent:21

## 2020-10-17 NOTE — Progress Notes (Signed)
[place] Phone: [phone] Subjective:   Charles Tate, am serving as a scribe for Dr. Hulan Saas.  This visit occurred during the SARS-CoV-2 public health emergency.  Safety protocols were in place, including screening questions prior to the visit, additional usage of staff PPE, and extensive cleaning of exam room while observing appropriate contact time as indicated for disinfecting solutions.    I'm seeing this patient by the request  of:  [name], MD  CC: right shoulder pain follow up   [id number]  08/22/2020 Patient will decrease in the hypoechoic changes that was noted previously.  Discussed icing regimen and home exercises, discussed which activities to do which wants to avoid.  Follow-up with me again 6 weeks.  If continuing to have pain would consider injection in the acromioclavicular joint patient is already done physical therapy.  Worsening weakness or decreased range of motion would encourage advanced imaging.  Update 10/18/2020 Charles Tate is a 70 y.o. male coming in with complaint of R shoulder pain. Patient states that he is doing better. Soreness with activity. Tate longer having sharp pain. Stiffness noted over top of shoulder.       Past Medical History:  Diagnosis Date   Allergy    Arthritis    left hip    GERD (gastroesophageal reflux disease)    per patient, resolved. 03/15/20   Heart murmur    adolescent rheumatic fever - developed murmur    Hyperlipidemia    Hypertension    RAD (reactive airway disease)    with RTIs   Tubular adenoma    Past Surgical History:  Procedure Laterality Date   COLONOSCOPY     colonoscopy with polypectomy  2013   Dr Ferdinand Lango, [place]   COLONOSCOPY WITH PROPOFOL N/A 07/01/2019   Procedure: COLONOSCOPY WITH PROPOFOL;  Surgeon: Irving Copas., MD;  Location: [facility];  Service: Gastroenterology;  Laterality: N/A;    COLONOSCOPY WITH PROPOFOL N/A 05/30/2020   Procedure: COLONOSCOPY WITH PROPOFOL;  Surgeon: Rush Landmark Telford Nab., MD;  Location: [facility];  Service: Gastroenterology;  Laterality: N/A;   ENDARTERECTOMY Right 04/11/2020   Procedure: Right Carotid Endarterectomy;  Surgeon: Elam Dutch, MD;  Location: [facility] OR;  Service: Vascular;  Laterality: Right;   ENDOSCOPIC MUCOSAL RESECTION N/A 07/01/2019   Procedure: ENDOSCOPIC MUCOSAL RESECTION;  Surgeon: Rush Landmark Telford Nab., MD;  Location: [facility];  Service: Gastroenterology;  Laterality: N/A;   ENDOSCOPIC MUCOSAL RESECTION N/A 05/30/2020   Procedure: ENDOSCOPIC MUCOSAL RESECTION;  Surgeon: Rush Landmark Telford Nab., MD;  Location: [facility];  Service: Gastroenterology;  Laterality: N/A;   HEMOSTASIS CLIP PLACEMENT  07/01/2019   Procedure: HEMOSTASIS CLIP PLACEMENT;  Surgeon: Irving Copas., MD;  Location: [facility];  Service: Gastroenterology;;   POLYPECTOMY     POLYPECTOMY  07/01/2019   Procedure: POLYPECTOMY;  Surgeon: Irving Copas., MD;  Location: [facility];  Service: Gastroenterology;;   POLYPECTOMY  05/30/2020   Procedure: POLYPECTOMY;  Surgeon: Irving Copas., MD;  Location: [facility];  Service: Gastroenterology;;   Maryagnes Amos INJECTION  07/01/2019   Procedure: SUBMUCOSAL LIFTING INJECTION;  Surgeon: Irving Copas., MD;  Location: [facility];  Service: Gastroenterology;;   SUBMUCOSAL TATTOO INJECTION  07/01/2019   Procedure: SUBMUCOSAL TATTOO INJECTION;  Surgeon: Irving Copas., MD;  Location: [facility];  Service: Gastroenterology;;   WISDOM TOOTH EXTRACTION     Social History   Socioeconomic History   Marital  status: Married    Spouse name: [name]   Number of children: 2   Years of education: Not on file   Highest education level: Not on file  Occupational History   Occupation: self employed   Tobacco Use   Smoking status: Former   Smokeless tobacco: Never    Tobacco comments:    intermittent, short term smoker as a teen. Some second hand smoke in 57s & 30s  Vaping Use   Vaping Use: Never used  Substance and Sexual Activity   Alcohol use: Yes    Comment:   very rarely   Drug use: Tate   Sexual activity: Not on file  Other Topics Concern   Not on file  Social History Narrative   Exercise: none regularly   Social Determinants of Health   Financial Resource Strain: Low Risk    Difficulty of Paying Living Expenses: Not hard at all  Food Insecurity: Tate Food Insecurity   Worried About Charity fundraiser in the Last Year: Never true   Ran Out of Food in the Last Year: Never true  Transportation Needs: Tate Transportation Needs   Lack of Transportation (Medical): Tate   Lack of Transportation (Non-Medical): Tate  Physical Activity: Not on file  Stress: Tate Stress Concern Present   Feeling of Stress : Not at all  Social Connections: Not on file   Allergies  Allergen Reactions   Ciprofloxacin Hcl     Numbness, joint pain   Cefdinir Diarrhea    Fever, chills, stomach cramps    Lisinopril Cough   Family History  Problem Relation Age of Onset   Arthritis Mother    Hyperlipidemia Mother    Lupus Mother    Heart attack Father 61   Hypertension Sister    Heart attack Paternal Grandmother 62   Diabetes Paternal Aunt    Stroke Maternal Grandfather 13   Cancer Paternal Grandfather        ? stomach; in 65s   Colon cancer Paternal Grandfather    Colon polyps Neg Hx    Esophageal cancer Neg Hx    Rectal cancer Neg Hx    Stomach cancer Neg Hx    Inflammatory bowel disease Neg Hx    Liver disease Neg Hx    Pancreatic cancer Neg Hx      Current Outpatient Medications (Cardiovascular):    atorvastatin (LIPITOR) 40 MG tablet, Take 1 tablet (40 mg total) by mouth daily.   telmisartan-hydrochlorothiazide (MICARDIS HCT) 80-25 MG tablet, TAKE 1 TABLET ONCE DAILY (Patient taking differently: Take 1 tablet by mouth daily. TAKE 1 TABLET ONCE  DAILY)  Current Outpatient Medications (Respiratory):    fluticasone (FLONASE) 50 MCG/ACT nasal spray, SPRAY 2 SPRAYS INTO EACH NOSTRIL EVERY DAY (Patient taking differently: Place 1 spray into both nostrils daily as needed for allergies.)  Current Outpatient Medications (Analgesics):    aspirin EC 81 MG tablet, Take 81 mg by mouth daily. Swallow whole.   Current Outpatient Medications (Other):    Colostrum 500 MG CAPS, Take 500 mg by mouth daily.   Misc Natural Products (SUPER GREENS PO), Take 1 Scoop by mouth daily.    SUTAB [phone] MG TABS, SMARTSIG:24 Tablet(s) By Mouth As Directed   Reviewed prior external information including notes and imaging from  primary care provider As well as notes that were available from care everywhere and other healthcare systems.  Past medical history, social, surgical and family history all reviewed in electronic medical record.  Tate pertanent  information unless stated regarding to the chief complaint.   Review of Systems:  Tate headache, visual changes, nausea, vomiting, diarrhea, constipation, dizziness, abdominal pain, skin rash, fevers, chills, night sweats, weight loss, swollen lymph nodes, body aches, joint swelling, chest pain, shortness of breath, mood changes. POSITIVE muscle aches  Objective  Blood pressure 118/72, pulse 61, height '6\' 3"'$  (1.905 m), weight 205 lb (93 kg), SpO2 97 %.   General: Tate apparent distress alert and oriented x3 mood and affect normal, dressed appropriately.  HEENT: Pupils equal, extraocular movements intact  Respiratory: Patient's speak in full sentences and does not appear short of breath  Cardiovascular: Tate lower extremity edema, non tender, Tate erythema  Gait normal with good balance and coordination.  MSK:  right shoulder exam does show some mild tenderness over the acromioclavicular joint.  Good range of motion and rotator cuff strength is intact.  Limited muscular skeletal ultrasound was performed and  interpreted by Hulan Saas, M  Limited ultrasound of patient's right shoulder shows rotator cuff does seem to be completely intact including the supraspinatus. Hypoechoic changes of AC joint noted and moderate OA.  Impression: Acromioclavicular arthritis with mild hypoechoic changes     Impression and Recommendations:     The above documentation has been reviewed and is accurate and complete [name], DO

## 2020-10-18 ENCOUNTER — Other Ambulatory Visit: Payer: Self-pay

## 2020-10-18 ENCOUNTER — Ambulatory Visit: Payer: Medicare HMO | Admitting: Family Medicine

## 2020-10-18 ENCOUNTER — Encounter: Payer: Self-pay | Admitting: Family Medicine

## 2020-10-18 ENCOUNTER — Ambulatory Visit: Payer: Self-pay

## 2020-10-18 VITALS — BP 118/72 | HR 61 | Ht 75.0 in | Wt 205.0 lb

## 2020-10-18 DIAGNOSIS — G8929 Other chronic pain: Secondary | ICD-10-CM | POA: Diagnosis not present

## 2020-10-18 DIAGNOSIS — M25511 Pain in right shoulder: Secondary | ICD-10-CM | POA: Diagnosis not present

## 2020-10-18 NOTE — Assessment & Plan Note (Signed)
Right shoulder pain seems to be more secondary to be acromioclavicular arthritic changes with some hypoechoic changes.  Patient now seems to be doing better overall and would like to continue with the conservative therapy.  Discussed the other possibility would be an injection which patient declined today.  Patient wants to continue with conservative therapy and follow-up with me again in 3 months

## 2020-10-18 NOTE — Patient Instructions (Signed)
Looks good Has some swelling in [facility] joint Continue to keep hands in peripheral vision Ice regularly Turmeric 500-'1000mg'$  daily See me in 3 months

## 2020-11-08 ENCOUNTER — Encounter (INDEPENDENT_AMBULATORY_CARE_PROVIDER_SITE_OTHER): Payer: Self-pay | Admitting: Ophthalmology

## 2020-11-08 ENCOUNTER — Ambulatory Visit (INDEPENDENT_AMBULATORY_CARE_PROVIDER_SITE_OTHER): Payer: Medicare HMO | Admitting: Ophthalmology

## 2020-11-08 ENCOUNTER — Other Ambulatory Visit: Payer: Self-pay

## 2020-11-08 DIAGNOSIS — H34231 Retinal artery branch occlusion, right eye: Secondary | ICD-10-CM

## 2020-11-08 DIAGNOSIS — H43813 Vitreous degeneration, bilateral: Secondary | ICD-10-CM | POA: Diagnosis not present

## 2020-11-08 DIAGNOSIS — H2513 Age-related nuclear cataract, bilateral: Secondary | ICD-10-CM

## 2020-11-08 NOTE — Progress Notes (Signed)
11/08/2020     CHIEF COMPLAINT Patient presents for  Chief Complaint  Patient presents with   Retina Evaluation      HISTORY OF PRESENT ILLNESS: Charles Tate is a 70 y.o. male who presents to the clinic today for:   HPI     Retina Evaluation   In both eyes.  I, the attending physician,  performed the HPI with the patient and updated documentation appropriately.        Comments   Now 6 months after last examination no increase in flashes floaters or no new visual changes.  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      Referring physician: Amedeo Kinsman, OD [phone] [facility] [address]  [place] [address]  HISTORICAL INFORMATION:   Selected notes from the MEDICAL RECORD NUMBER    Lab Results  Component Value Date   HGBA1C 6.0 06/27/2020     CURRENT MEDICATIONS: No current outpatient medications on file. (Ophthalmic Drugs)   No current facility-administered medications for this visit. (Ophthalmic Drugs)   Current Outpatient Medications (Other)  Medication Sig   aspirin EC 81 MG tablet Take 81 mg by mouth daily. Swallow whole.   atorvastatin (LIPITOR) 40 MG tablet Take 1 tablet (40 mg total) by mouth daily.   Colostrum 500 MG CAPS Take 500 mg by mouth daily.   fluticasone (FLONASE) 50 MCG/ACT nasal spray SPRAY 2 SPRAYS INTO EACH NOSTRIL EVERY DAY (Patient taking differently: Place 1 spray into both nostrils daily as needed for allergies.)   Misc Natural Products (SUPER GREENS PO) Take 1 Scoop by mouth daily.    SUTAB [phone] MG TABS SMARTSIG:24 Tablet(s) By Mouth As Directed   telmisartan-hydrochlorothiazide (MICARDIS HCT) 80-25 MG tablet TAKE 1 TABLET ONCE DAILY (Patient taking differently: Take 1 tablet by mouth daily. TAKE 1 TABLET ONCE DAILY)    No current facility-administered medications for this visit. (Other)      REVIEW OF SYSTEMS:    ALLERGIES Allergies  Allergen Reactions   Ciprofloxacin Hcl     Numbness, joint pain   Cefdinir Diarrhea    Fever, chills, stomach cramps    Lisinopril Cough    PAST MEDICAL HISTORY Past Medical History:  Diagnosis Date   Allergy    Arthritis    left hip    GERD (gastroesophageal reflux disease)    per patient, resolved. 03/15/20   Heart murmur    adolescent rheumatic fever - developed murmur    Hyperlipidemia    Hypertension    RAD (reactive airway disease)    with RTIs   Tubular adenoma    Past Surgical History:  Procedure Laterality Date   COLONOSCOPY     colonoscopy with polypectomy  2013   Dr Ferdinand Lango, [place]   COLONOSCOPY WITH PROPOFOL N/A 07/01/2019   Procedure: COLONOSCOPY WITH PROPOFOL;  Surgeon: Irving Copas., MD;  Location: [facility];  Service: Gastroenterology;  Laterality: N/A;   COLONOSCOPY WITH PROPOFOL N/A 05/30/2020   Procedure: COLONOSCOPY WITH PROPOFOL;  Surgeon: Rush Landmark Telford Nab., MD;  Location: [facility];  Service: Gastroenterology;  Laterality: N/A;   ENDARTERECTOMY Right 04/11/2020   Procedure: Right Carotid Endarterectomy;  Surgeon: Elam Dutch, MD;  Location: [place];  Service: Vascular;  Laterality: Right;   ENDOSCOPIC  MUCOSAL RESECTION N/A 07/01/2019   Procedure: ENDOSCOPIC MUCOSAL RESECTION;  Surgeon: Rush Landmark Telford Nab., MD;  Location: [facility];  Service: Gastroenterology;  Laterality: N/A;   ENDOSCOPIC MUCOSAL RESECTION N/A 05/30/2020   Procedure: ENDOSCOPIC MUCOSAL RESECTION;  Surgeon: Rush Landmark Telford Nab., MD;  Location: [facility];  Service: Gastroenterology;  Laterality: N/A;   HEMOSTASIS CLIP PLACEMENT  07/01/2019   Procedure: HEMOSTASIS CLIP PLACEMENT;  Surgeon: Irving Copas., MD;  Location: [facility];  Service: Gastroenterology;;   POLYPECTOMY     POLYPECTOMY  07/01/2019   Procedure:  POLYPECTOMY;  Surgeon: Irving Copas., MD;  Location: [facility];  Service: Gastroenterology;;   POLYPECTOMY  05/30/2020   Procedure: POLYPECTOMY;  Surgeon: Irving Copas., MD;  Location: [facility];  Service: Gastroenterology;;   SUBMUCOSAL LIFTING INJECTION  07/01/2019   Procedure: SUBMUCOSAL LIFTING INJECTION;  Surgeon: Irving Copas., MD;  Location: [facility];  Service: Gastroenterology;;   SUBMUCOSAL TATTOO INJECTION  07/01/2019   Procedure: SUBMUCOSAL TATTOO INJECTION;  Surgeon: Irving Copas., MD;  Location: [facility];  Service: Gastroenterology;;   WISDOM TOOTH EXTRACTION      FAMILY HISTORY Family History  Problem Relation Age of Onset   Arthritis Mother    Hyperlipidemia Mother    Lupus Mother    Heart attack Father 48   Hypertension Sister    Heart attack Paternal Grandmother 37   Diabetes Paternal Aunt    Stroke Maternal Grandfather 90   Cancer Paternal Grandfather        ? stomach; in 26s   Colon cancer Paternal Grandfather    Colon polyps Neg Hx    Esophageal cancer Neg Hx    Rectal cancer Neg Hx    Stomach cancer Neg Hx    Inflammatory bowel disease Neg Hx    Liver disease Neg Hx    Pancreatic cancer Neg Hx     SOCIAL HISTORY Social History   Tobacco Use   Smoking status: Former   Smokeless tobacco: Never   Tobacco comments:    intermittent, short term smoker as a teen. Some second hand smoke in 53s & 30s  Vaping Use   Vaping Use: Never used  Substance Use Topics   Alcohol use: Yes    Comment:   very rarely   Drug use: No         OPHTHALMIC EXAM:  Base Eye Exam     Visual Acuity (ETDRS)       Right Left   Dist cc 20/15 -1 20/20 +1    Correction: Glasses         Tonometry (Tonopen, 8:07 AM)       Right Left   Pressure 15 19         Pupils       Pupils React APD   Right PERRL Brisk None   Left PERRL Brisk None         Visual Fields       Left Right    Full Full          Extraocular Movement       Right Left    Full, Ortho Full, Ortho         Neuro/Psych     Oriented x3: Yes   Mood/Affect: Normal         Dilation     Both eyes: 1.0% Mydriacyl, 2.5% Phenylephrine @ 8:14 AM           Slit Lamp and Fundus Exam  External Exam       Right Left   External Normal Normal         Slit Lamp Exam       Right Left   Lids/Lashes Normal Normal   Conjunctiva/Sclera White and quiet White and quiet   Cornea Clear Clear   Anterior Chamber Deep and quiet Deep and quiet   Iris Round and reactive Round and reactive   Lens 2+ Nuclear sclerosis 2+ Nuclear sclerosis   Anterior Vitreous Normal Normal         Fundus Exam       Right Left   Posterior Vitreous Posterior vitreous detachment Normal   Disc Normal Normal   C/D Ratio 0.2 0.2   Macula refractile calcific deposit in the  Small cilioretinal artery on the nasal aspect of the fovea.  Separate refractile deposit in the retinal small vessels inferotemporal portion of the macula this is clearly Hollenhorst plaque, calcific Normal   Vessels  No signs of complete cilio retinal vessel occlusion ,  there are refractile calcific deposits in the cilioretinal artery on the nasal aspect of the fovea.  Separate refractile deposit in the retinal small vessels inferotemporal portion of the macula thi, , s is clearly Hollenhorst plaque, calcific.  These findings of calcific Hollenhorst plaques are new after therapeutic intervention 2 weeks ago of aqueous paracentesis to soften the globe to allow for distal displacement of cilioretinal artery occlusion.,   , Clearly these are now refractile, reflective and calcific in nature Normal, incidental note that the inferotemporal branch retinal artery, is independent from central retinal artery OS   Periphery Normal Normal            IMAGING AND PROCEDURES  Imaging and Procedures for 11/08/20  OCT, Retina - OU - Both Eyes       Right Eye Quality was  good. Scan locations included subfoveal. Central Foveal Thickness: 301. Progression has improved. Findings include abnormal foveal contour.   Left Eye Quality was good. Scan locations included subfoveal. Central Foveal Thickness: 298. Progression has been stable. Findings include normal foveal contour.   Notes Injury of the inner retinal layers continues yet with preservation of some of the retinal architecture nasal to the fovea OD .  This region of atrophy in the papillomacular bundle temporal to the nerve is coincident and as a consequence of small cilioretinal branch artery occlusion, associated initially with edema, see initial OCT November 2021             ASSESSMENT/PLAN:  Nuclear sclerotic cataract of both eyes The nature of cataract was discussed with the patient as well as the elective nature of surgery. The patient was reassured that surgery at a later date does not put the patient at risk for a worse outcome. It was emphasized that the need for surgery is dictated by the patient's quality of life as influenced by the cataract. Patient was instructed to maintain close follow up with their general eye care doctor. Follow-up with Dr. 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 Branch retinal artery occlusion, right eye  H34.231 OCT, Retina - OU - Both Eyes    2. Posterior vitreous detachment of both eyes  H43.813     3. Nuclear sclerotic cataract of both eyes  H25.13       1.  OD, no interval change, no new refractile bodies in the retinal vasculature of either eye  2.  Progression of nuclear sclerotic cataract present in each eye likely to start imparting visual difficulties with nighttime vision or dark rainy days  3.   Follow-up completely with Dr. 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      Return if symptoms worsen or fail to improve, for ,,, And follow-up with Dr. Teola Bradley in [facility] as scheduled.  There are no Patient Instructions on file for this visit.   Explained the diagnoses, plan, and follow up with the patient and they expressed understanding.  Patient expressed understanding of the importance of proper follow up care.   Clent Demark Emila Steinhauser M.D. Diseases & Surgery of the Retina and Vitreous Retina & Diabetic [place] 11/08/20     Abbreviations: M myopia (nearsighted); A astigmatism; H hyperopia (farsighted); P presbyopia; Mrx spectacle prescription;  CTL contact lenses; OD right eye; OS left eye; OU both eyes  XT exotropia; ET esotropia; PEK punctate epithelial keratitis; PEE punctate epithelial erosions; DES dry eye syndrome; MGD meibomian gland dysfunction; ATs artificial tears; PFAT's preservative free artificial tears; [place] nuclear sclerotic cataract; PSC posterior subcapsular cataract; ERM epi-retinal membrane; PVD posterior vitreous detachment; RD retinal detachment; DM diabetes mellitus; DR diabetic retinopathy; NPDR non-proliferative diabetic retinopathy; PDR proliferative diabetic retinopathy; CSME clinically significant macular edema; DME diabetic macular edema; dbh dot blot hemorrhages; CWS cotton wool spot; POAG primary open angle glaucoma; C/D cup-to-disc ratio; HVF humphrey visual field; GVF goldmann visual field; OCT optical coherence tomography; IOP intraocular pressure; BRVO Branch retinal vein occlusion; CRVO central retinal vein occlusion; CRAO central retinal artery occlusion; BRAO branch retinal artery occlusion; RT retinal tear; SB scleral buckle; PPV pars plana  vitrectomy; VH Vitreous hemorrhage; PRP panretinal laser photocoagulation; IVK intravitreal kenalog; VMT vitreomacular traction; MH Macular hole;  NVD neovascularization of the disc; NVE neovascularization elsewhere; AREDS age related eye disease study; ARMD age related macular degeneration; POAG primary open angle glaucoma; EBMD epithelial/anterior basement membrane dystrophy; ACIOL anterior chamber intraocular lens; IOL intraocular lens; PCIOL posterior chamber intraocular lens; Phaco/IOL phacoemulsification with intraocular lens placement; [place] photorefractive keratectomy; LASIK laser assisted in situ keratomileusis; HTN hypertension; DM diabetes mellitus; COPD chronic obstructive pulmonary disease

## 2020-11-08 NOTE — Assessment & Plan Note (Signed)
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

## 2020-11-08 NOTE — Assessment & Plan Note (Addendum)
The nature of cataract was discussed with the patient as well as the elective nature of surgery. The patient was reassured that surgery at a later date does not put the patient at risk for a worse outcome. It was emphasized that the need for surgery is dictated by the patient's quality of life as influenced by the cataract. Patient was instructed to maintain close follow up with their general eye care doctor. Follow-up with Dr. Teola Bradley

## 2020-11-14 ENCOUNTER — Telehealth: Payer: Self-pay | Admitting: Pharmacist

## 2020-11-14 NOTE — Progress Notes (Signed)
Chronic Care Management Pharmacy Assistant   Name: Charles Tate  MRN: [id number] DOB: 07/09/1950   Reason for Encounter: Disease State   Conditions to be addressed/monitored: HTN   Recent office visits:  None ID  Recent consult visits:  11/08/20 Hurman Horn, MD-Ophthalmology  (Branch retinal artery occlusion, right eye) 10/18/20 [name], DO-Sports Medicine (Chronic right shoulder pain)  Hospital visits:  None since last coordination review  Medications: Outpatient Encounter Medications as of 11/14/2020  Medication Sig Note   aspirin EC 81 MG tablet Take 81 mg by mouth daily. Swallow whole.    atorvastatin (LIPITOR) 40 MG tablet Take 1 tablet (40 mg total) by mouth daily.    Colostrum 500 MG CAPS Take 500 mg by mouth daily. 05/16/2020: Pt ran out, needs to get more   fluticasone (FLONASE) 50 MCG/ACT nasal spray SPRAY 2 SPRAYS INTO EACH NOSTRIL EVERY DAY (Patient taking differently: Place 1 spray into both nostrils daily as needed for allergies.)    Misc Natural Products (SUPER GREENS PO) Take 1 Scoop by mouth daily.     SUTAB [phone] MG TABS SMARTSIG:24 Tablet(s) By Mouth As Directed    telmisartan-hydrochlorothiazide (MICARDIS HCT) 80-25 MG tablet TAKE 1 TABLET ONCE DAILY (Patient taking differently: Take 1 tablet by mouth daily. TAKE 1 TABLET ONCE DAILY)    No facility-administered encounter medications on file as of 11/14/2020.    Recent Office Vitals: BP Readings from Last 3 Encounters:  10/18/20 118/72  08/22/20 (!) 142/80  06/27/20 126/74   Pulse Readings from Last 3 Encounters:  10/18/20 61  08/22/20 64  06/27/20 63    Wt Readings from Last 3 Encounters:  10/18/20 205 lb (93 kg)  08/22/20 205 lb (93 kg)  06/27/20 206 lb (93.4 kg)     Kidney Function Lab Results  Component Value Date/Time   CREATININE 0.86 06/27/2020 02:16 PM   CREATININE 0.94 04/12/2020 01:53 AM   GFR 87.43 06/27/2020 02:16 PM   GFRNONAA >60 04/12/2020 01:53 AM    GFRAA 129 06/28/2006 10:44 AM    BMP Latest Ref Rng & Units 06/27/2020 04/12/2020 04/04/2020  Glucose 70 - 99 mg/dL 79 130(H) 111(H)  BUN 6 - 23 mg/dL '19 19 17  '$ Creatinine 0.40 - 1.50 mg/dL 0.86 0.94 0.89  Sodium 135 - 145 mEq/L 139 136 138  Potassium 3.5 - 5.1 mEq/L 4.0 3.6 3.5  Chloride 96 - 112 mEq/L 101 99 102  CO2 19 - 32 mEq/L '28 27 26  '$ Calcium 8.4 - 10.5 mg/dL 9.4 8.7(L) 9.4     Contacted patient on 11/14/20 to discuss hypertension disease state  Current antihypertensive regimen:  Telmisartan-hctz 80-25 mg 1 tab daily  Patient verbally confirms he is taking the above medications as directed. Yes  How often are you checking your Blood Pressure? weekly  he checks his blood pressure in the morning before taking his medication.  Current home BP readings: Patient blood pressure today at 1pm was 126/74   Wrist or arm cuff:arm cuff Caffeine intake: 4 cups  of coffee Salt intake:no OTC medications including pseudoephedrine or NSAIDs?  Any readings above 180/120? No If yes any symptoms of hypertensive emergency? patient denies any symptoms of high blood pressure   What recent interventions/DTPs have been made by any provider to improve Blood Pressure control since last CPP Visit: none noted  Any recent hospitalizations or ED visits since last visit with CPP? No  What diet changes have been made to improve Blood Pressure  Control?  Patient states that he does not eat fried foods  What exercise is being done to improve your Blood Pressure Control?  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 Yes Does the patient have >5 day gap between last estimated fill dates? No   Star Rating Drugs:  Medication:  Last Fill: Day Supply Telmisartan-hctz 80-25 08/22/20 90 Atorvastatin 40 mg   10/21/20  90  Care Gaps: Annual wellness visit in last year? Yes Most  Recent BP reading:118/60   PCP appointment on 02/06/21    [place] Pharmacist Assistant [phone]   Time spent: 20

## 2020-12-15 ENCOUNTER — Telehealth: Payer: Self-pay

## 2020-12-15 NOTE — Chronic Care Management (AMB) (Signed)
Chronic Care Management Pharmacy Assistant   Name: Charles Tate  MRN: [id number] DOB: April 28, 1950   Reason for Encounter: Disease State   Conditions to be addressed/monitored: HTN   Recent office visits:  None ID  Recent consult visits:  None ID  Hospital visits:  None in previous 6 months  Medications: Outpatient Encounter Medications as of 12/15/2020  Medication Sig Note   aspirin EC 81 MG tablet Take 81 mg by mouth daily. Swallow whole.    atorvastatin (LIPITOR) 40 MG tablet Take 1 tablet (40 mg total) by mouth daily.    Colostrum 500 MG CAPS Take 500 mg by mouth daily. 05/16/2020: Pt ran out, needs to get more   fluticasone (FLONASE) 50 MCG/ACT nasal spray SPRAY 2 SPRAYS INTO EACH NOSTRIL EVERY DAY (Patient taking differently: Place 1 spray into both nostrils daily as needed for allergies.)    Misc Natural Products (SUPER GREENS PO) Take 1 Scoop by mouth daily.     SUTAB [phone] MG TABS SMARTSIG:24 Tablet(s) By Mouth As Directed    telmisartan-hydrochlorothiazide (MICARDIS HCT) 80-25 MG tablet TAKE 1 TABLET ONCE DAILY (Patient taking differently: Take 1 tablet by mouth daily. TAKE 1 TABLET ONCE DAILY)    No facility-administered encounter medications on file as of 12/15/2020.   Reviewed chart prior to disease state call. Spoke with patient regarding BP  Recent Office Vitals: BP Readings from Last 3 Encounters:  10/18/20 118/72  08/22/20 (!) 142/80  06/27/20 126/74   Pulse Readings from Last 3 Encounters:  10/18/20 61  08/22/20 64  06/27/20 63    Wt Readings from Last 3 Encounters:  10/18/20 205 lb (93 kg)  08/22/20 205 lb (93 kg)  06/27/20 206 lb (93.4 kg)     Kidney Function Lab Results  Component Value Date/Time   CREATININE 0.86 06/27/2020 02:16 PM   CREATININE 0.94 04/12/2020 01:53 AM   GFR 87.43 06/27/2020 02:16 PM   GFRNONAA >60 04/12/2020 01:53 AM   GFRAA 129 06/28/2006 10:44 AM    BMP Latest Ref Rng & Units 06/27/2020 04/12/2020  04/04/2020  Glucose 70 - 99 mg/dL 79 130(H) 111(H)  BUN 6 - 23 mg/dL 19 19 17   Creatinine 0.40 - 1.50 mg/dL 0.86 0.94 0.89  Sodium 135 - 145 mEq/L 139 136 138  Potassium 3.5 - 5.1 mEq/L 4.0 3.6 3.5  Chloride 96 - 112 mEq/L 101 99 102  CO2 19 - 32 mEq/L 28 27 26   Calcium 8.4 - 10.5 mg/dL 9.4 8.7(L) 9.4    Current antihypertensive regimen:  Telmisartan-hctz 80-25 mg 1 tab daily  How often are you checking your Blood Pressure? weekly  Current home BP readings: NA, have not checked  What recent interventions/DTPs have been made by any provider to improve Blood Pressure control since last CPP Visit: None noted  Any recent hospitalizations or ED visits since last visit with CPP? No  What diet changes have been made to improve Blood Pressure Control?  Patient has not made any changes to diet  What exercise is being done to improve your Blood Pressure Control?  Patient states he is very active  Adherence Review: Is the patient currently on ACE/ARB medication? Yes Does the patient have >5 day gap between last estimated fill dates? No     Care Gaps: Colonoscopy-05/30/20 Diabetic Foot Exam-NA Mammogram-NA Ophthalmology-NA Dexa Scan - NA Annual Well Visit - 02/03/20 Micro albumin-NA Hemoglobin A1c- 06/27/20  Star Rating Drugs: Telmisartan-hctz 80-25mg   11/26/20  90 Atorvastatin 40 mg   10/21/20               [place] Pharmacist Assistant [phone]

## 2020-12-18 ENCOUNTER — Encounter: Payer: Self-pay | Admitting: Internal Medicine

## 2021-01-17 NOTE — Progress Notes (Signed)
[place] Phone: [phone] Subjective:   [name], am serving as a scribe for Dr. Hulan Saas.  This visit occurred during the SARS-CoV-2 public health emergency.  Safety protocols were in place, including screening questions prior to the visit, additional usage of staff PPE, and extensive cleaning of exam room while observing appropriate contact time as indicated for disinfecting solutions.   I'm seeing this patient by the request  of:  [name], MD  CC: Right shoulder pain follow-up  MPN:[id number]  10/18/2020       Right shoulder pain seems to be more secondary to be acromioclavicular arthritic changes with some hypoechoic changes.  Patient now seems to be doing better overall and would like to continue with the conservative therapy.  Discussed the other possibility would be an injection which patient declined today.  Patient wants to continue with conservative therapy and follow-up with me again in 3 months      Update 01/18/2021 GIOVAN PINSKY is a 70 y.o. male coming in with complaint of R shoulder pain. Overall feels like he is getting better but continues to have stiffness and soreness. Patient states that his pain got better but then pain started to radiate across his thoracic spine into the L scapula. Pain no occurring when he extends his shoulder. Pain occurs suddenly with extension but then goes away. Palpable pain over R scapula. Has not been back to the gym. Patient notes tingling in both hands when trying to sleep on his back as he is usually a stomach sleeper.        Past Medical History:  Diagnosis Date   Allergy    Arthritis    left hip    GERD (gastroesophageal reflux disease)    per patient, resolved. 03/15/20   Heart murmur    adolescent rheumatic fever - developed murmur    Hyperlipidemia    Hypertension    RAD (reactive airway disease)    with RTIs   Tubular adenoma     Past Surgical History:  Procedure Laterality Date   COLONOSCOPY     colonoscopy with polypectomy  2013   Dr Ferdinand Lango, [place]   COLONOSCOPY WITH PROPOFOL N/A 07/01/2019   Procedure: COLONOSCOPY WITH PROPOFOL;  Surgeon: Irving Copas., MD;  Location: [facility];  Service: Gastroenterology;  Laterality: N/A;   COLONOSCOPY WITH PROPOFOL N/A 05/30/2020   Procedure: COLONOSCOPY WITH PROPOFOL;  Surgeon: Rush Landmark Telford Nab., MD;  Location: [facility];  Service: Gastroenterology;  Laterality: N/A;   ENDARTERECTOMY Right 04/11/2020   Procedure: Right Carotid Endarterectomy;  Surgeon: Elam Dutch, MD;  Location: [facility] OR;  Service: Vascular;  Laterality: Right;   ENDOSCOPIC MUCOSAL RESECTION N/A 07/01/2019   Procedure: ENDOSCOPIC MUCOSAL RESECTION;  Surgeon: Rush Landmark Telford Nab., MD;  Location: [facility];  Service: Gastroenterology;  Laterality: N/A;   ENDOSCOPIC MUCOSAL RESECTION N/A 05/30/2020   Procedure: ENDOSCOPIC MUCOSAL RESECTION;  Surgeon: Rush Landmark Telford Nab., MD;  Location: [facility];  Service: Gastroenterology;  Laterality: N/A;   HEMOSTASIS CLIP PLACEMENT  07/01/2019   Procedure: HEMOSTASIS CLIP PLACEMENT;  Surgeon: Irving Copas., MD;  Location: [facility];  Service: Gastroenterology;;   POLYPECTOMY     POLYPECTOMY  07/01/2019   Procedure: POLYPECTOMY;  Surgeon: Irving Copas., MD;  Location: [facility];  Service: Gastroenterology;;   POLYPECTOMY  05/30/2020   Procedure: POLYPECTOMY;  Surgeon: Irving Copas., MD;  Location: [facility];  Service: Gastroenterology;;  SUBMUCOSAL LIFTING INJECTION  07/01/2019   Procedure: SUBMUCOSAL LIFTING INJECTION;  Surgeon: Rush Landmark Telford Nab., MD;  Location: [facility];  Service: Gastroenterology;;   SUBMUCOSAL TATTOO INJECTION  07/01/2019   Procedure: SUBMUCOSAL TATTOO INJECTION;  Surgeon: Irving Copas., MD;  Location: [facility];  Service: Gastroenterology;;   WISDOM TOOTH  EXTRACTION     Social History   Socioeconomic History   Marital status: Married    Spouse name: [name]   Number of children: 2   Years of education: Not on file   Highest education level: Not on file  Occupational History   Occupation: self employed   Tobacco Use   Smoking status: Former   Smokeless tobacco: Never   Tobacco comments:    intermittent, short term smoker as a teen. Some second hand smoke in 92s & 30s  Vaping Use   Vaping Use: Never used  Substance and Sexual Activity   Alcohol use: Yes    Comment:   very rarely   Drug use: No   Sexual activity: Not on file  Other Topics Concern   Not on file  Social History Narrative   Exercise: none regularly   Social Determinants of Health   Financial Resource Strain: Low Risk    Difficulty of Paying Living Expenses: Not hard at all  Food Insecurity: No Food Insecurity   Worried About Charity fundraiser in the Last Year: Never true   Ran Out of Food in the Last Year: Never true  Transportation Needs: No Transportation Needs   Lack of Transportation (Medical): No   Lack of Transportation (Non-Medical): No  Physical Activity: Not on file  Stress: No Stress Concern Present   Feeling of Stress : Not at all  Social Connections: Not on file   Allergies  Allergen Reactions   Ciprofloxacin Hcl     Numbness, joint pain   Cefdinir Diarrhea    Fever, chills, stomach cramps    Lisinopril Cough   Family History  Problem Relation Age of Onset   Arthritis Mother    Hyperlipidemia Mother    Lupus Mother    Heart attack Father 46   Hypertension Sister    Heart attack Paternal Grandmother 50   Diabetes Paternal Aunt    Stroke Maternal Grandfather 51   Cancer Paternal Grandfather        ? stomach; in 77s   Colon cancer Paternal Grandfather    Colon polyps Neg Hx    Esophageal cancer Neg Hx    Rectal cancer Neg Hx    Stomach cancer Neg Hx    Inflammatory bowel disease Neg Hx    Liver disease Neg Hx    Pancreatic  cancer Neg Hx      Current Outpatient Medications (Cardiovascular):    atorvastatin (LIPITOR) 40 MG tablet, Take 1 tablet (40 mg total) by mouth daily.   telmisartan-hydrochlorothiazide (MICARDIS HCT) 80-25 MG tablet, TAKE 1 TABLET ONCE DAILY (Patient taking differently: Take 1 tablet by mouth daily. TAKE 1 TABLET ONCE DAILY)  Current Outpatient Medications (Respiratory):    fluticasone (FLONASE) 50 MCG/ACT nasal spray, SPRAY 2 SPRAYS INTO EACH NOSTRIL EVERY DAY (Patient taking differently: Place 1 spray into both nostrils daily as needed for allergies.)  Current Outpatient Medications (Analgesics):    aspirin EC 81 MG tablet, Take 81 mg by mouth daily. Swallow whole.   Current Outpatient Medications (Other):    Colostrum 500 MG CAPS, Take 500 mg by mouth daily.   Misc Natural Products (SUPER  GREENS PO), Take 1 Scoop by mouth daily.    Reviewed prior external information including notes and imaging from  primary care provider As well as notes that were available from care everywhere and other healthcare systems.  Past medical history, social, surgical and family history all reviewed in electronic medical record.  No pertanent information unless stated regarding to the chief complaint.   Review of Systems:  No headache, visual changes, nausea, vomiting, diarrhea, constipation, dizziness, abdominal pain, skin rash, fevers, chills, night sweats, weight loss, swollen lymph nodes, body aches, joint swelling, chest pain, shortness of breath, mood changes. POSITIVE muscle aches  Objective  Blood pressure 110/66, pulse 72, height 6\' 3"  (1.905 m), weight 203 lb (92.1 kg), SpO2 98 %.   General: No apparent distress alert and oriented x3 mood and affect normal, dressed appropriately.  HEENT: Pupils equal, extraocular movements intact  Respiratory: Patient's speak in full sentences and does not appear short of breath  Cardiovascular: No lower extremity edema, non tender, no erythema  Gait  normal with good balance and coordination.  MSK: Right shoulder exam shows the patient continues to have discomfort and pain.  Mild atrophy of the musculature.  Does have weakness noted with 3+ out of 5 strength of the rotator cuff compared to the contralateral side and positive crossover noted.  Good grip strength noted.   Impression and Recommendations:     The above documentation has been reviewed and is accurate and complete [name], DO

## 2021-01-18 ENCOUNTER — Ambulatory Visit: Payer: Medicare HMO | Admitting: Family Medicine

## 2021-01-18 ENCOUNTER — Other Ambulatory Visit: Payer: Self-pay

## 2021-01-18 ENCOUNTER — Ambulatory Visit: Payer: Self-pay

## 2021-01-18 ENCOUNTER — Encounter: Payer: Self-pay | Admitting: Family Medicine

## 2021-01-18 VITALS — BP 110/66 | HR 72 | Ht 75.0 in | Wt 203.0 lb

## 2021-01-18 DIAGNOSIS — M25511 Pain in right shoulder: Secondary | ICD-10-CM | POA: Diagnosis not present

## 2021-01-18 NOTE — Assessment & Plan Note (Signed)
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  Patient does have more trigger point in the scapular region as well today.  Patient wants to restart formal physical therapy which I think is a good idea.  We did discuss advanced imaging with this going on nearly 6 to 8 months at the time.  Patient would like to continue conservative therapy for another 2 months.  We discussed the if waiting too long this could be concerning.  Patient is still thinks he is making progress is just slower than he would like.  Follow-up with me again 2 months

## 2021-01-18 NOTE — Patient Instructions (Signed)
PT [place] Do HEP some more Worsening pain recommend MRI See me again in 2 months

## 2021-01-23 ENCOUNTER — Ambulatory Visit: Payer: Medicare HMO | Admitting: Physician Assistant

## 2021-01-23 ENCOUNTER — Ambulatory Visit (HOSPITAL_COMMUNITY)
Admission: RE | Admit: 2021-01-23 | Discharge: 2021-01-23 | Disposition: A | Discharge status: Home / Self Care | Payer: Medicare HMO | Source: Ambulatory Visit | Attending: Vascular Surgery | Admitting: Vascular Surgery

## 2021-01-23 ENCOUNTER — Other Ambulatory Visit: Payer: Self-pay

## 2021-01-23 VITALS — BP 88/52 | HR 63 | Temp 98.3°F | Resp 20 | Ht 75.0 in | Wt 201.5 lb

## 2021-01-23 DIAGNOSIS — I6521 Occlusion and stenosis of right carotid artery: Secondary | ICD-10-CM | POA: Insufficient documentation

## 2021-01-23 NOTE — Progress Notes (Signed)
Office Note     CC:  follow up Requesting Provider:  [name], MD  HPI: Charles Tate is a 70 y.o. (Sep 23, 1950) male who presents for follow up of carotid artery stenosis. He is s/p right CEA 04/11/20 by Dr. Oneida Alar. This was done secondary to symptomatic right ICA stenosis with retinal artery embolization. He did well post op. On initial follow up he was having some mild hoarseness and labile blood pressures and pulses. Otherwise remained neurologically intact.   Today he reports that he is overall doing well. He is still having a little numbness along his right jaw line and neck as well as some pulling/ tenderness along incision. He does say it is improving and much better than it was. 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 He reports occasionally having dizzy spells and even times where he "blacks out". This usually happens if he is up and down a lot at work. He has to sit down and take a break. Recently when this has been happening he has had his blood pressure cuff with him and when he checks his blood pressure it has been running low. He has follow up with his PCP on 12/6 to discuss this. He otherwise denies any visual changes or amaurosis, slurred speech, facial drooping, unilateral upper or lower extremity weakness or numbness. He says he has lost about 20 lbs since his surgery. He has been more active and also watching his diet. He feels this may be contributing to his blood pressures being lower as well. He has been compliant with his aspirin and statin.   The pt is on a statin for cholesterol management.  The pt is on a daily aspirin.   Other AC:  none The pt is on Micardis for hypertension.   The pt is not diabetic.   Tobacco hx:  Former  Past Medical History:  Diagnosis Date   Allergy    Arthritis    left hip    GERD (gastroesophageal reflux disease)    per patient, resolved. 03/15/20   Heart murmur    adolescent rheumatic fever - developed  murmur    Hyperlipidemia    Hypertension    RAD (reactive airway disease)    with RTIs   Tubular adenoma     Past Surgical History:  Procedure Laterality Date   COLONOSCOPY     colonoscopy with polypectomy  2013   Dr Ferdinand Lango, [place]   COLONOSCOPY WITH PROPOFOL N/A 07/01/2019   Procedure: COLONOSCOPY WITH PROPOFOL;  Surgeon: Irving Copas., MD;  Location: [facility];  Service: Gastroenterology;  Laterality: N/A;   COLONOSCOPY WITH PROPOFOL N/A 05/30/2020   Procedure: COLONOSCOPY WITH PROPOFOL;  Surgeon: Rush Landmark Telford Nab., MD;  Location: [facility];  Service: Gastroenterology;  Laterality: N/A;   ENDARTERECTOMY Right 04/11/2020   Procedure: Right Carotid Endarterectomy;  Surgeon: Elam Dutch, MD;  Location: [facility] OR;  Service: Vascular;  Laterality: Right;   ENDOSCOPIC MUCOSAL RESECTION N/A 07/01/2019   Procedure: ENDOSCOPIC MUCOSAL RESECTION;  Surgeon: Rush Landmark Telford Nab., MD;  Location: [facility];  Service: Gastroenterology;  Laterality: N/A;   ENDOSCOPIC MUCOSAL RESECTION N/A 05/30/2020   Procedure: ENDOSCOPIC MUCOSAL RESECTION;  Surgeon: Rush Landmark Telford Nab., MD;  Location: [facility];  Service: Gastroenterology;  Laterality: N/A;   HEMOSTASIS CLIP PLACEMENT  07/01/2019   Procedure: HEMOSTASIS CLIP PLACEMENT;  Surgeon: Irving Copas., MD;  Location: [facility];  Service: Gastroenterology;;   POLYPECTOMY  POLYPECTOMY  07/01/2019   Procedure: POLYPECTOMY;  Surgeon: Mansouraty, Telford Nab., MD;  Location: [facility];  Service: Gastroenterology;;   POLYPECTOMY  05/30/2020   Procedure: POLYPECTOMY;  Surgeon: Irving Copas., MD;  Location: [facility];  Service: Gastroenterology;;   Maryagnes Amos INJECTION  07/01/2019   Procedure: SUBMUCOSAL LIFTING INJECTION;  Surgeon: Irving Copas., MD;  Location: [facility];  Service: Gastroenterology;;   SUBMUCOSAL TATTOO INJECTION  07/01/2019   Procedure: SUBMUCOSAL TATTOO  INJECTION;  Surgeon: Irving Copas., MD;  Location: [facility];  Service: Gastroenterology;;   WISDOM TOOTH EXTRACTION      Social History   Socioeconomic History   Marital status: Married    Spouse name: [name]   Number of children: 2   Years of education: Not on file   Highest education level: Not on file  Occupational History   Occupation: self employed   Tobacco Use   Smoking status: Former   Smokeless tobacco: Never   Tobacco comments:    intermittent, short term smoker as a teen. Some second hand smoke in 25s & 30s  Vaping Use   Vaping Use: Never used  Substance and Sexual Activity   Alcohol use: Yes    Comment:   very rarely   Drug use: No   Sexual activity: Not on file  Other Topics Concern   Not on file  Social History Narrative   Exercise: none regularly   Social Determinants of Health   Financial Resource Strain: Low Risk    Difficulty of Paying Living Expenses: Not hard at all  Food Insecurity: No Food Insecurity   Worried About Charity fundraiser in the Last Year: Never true   Ran Out of Food in the Last Year: Never true  Transportation Needs: No Transportation Needs   Lack of Transportation (Medical): No   Lack of Transportation (Non-Medical): No  Physical Activity: Not on file  Stress: No Stress Concern Present   Feeling of Stress : Not at all  Social Connections: Not on file  Intimate Partner Violence: Not At Risk   Fear of Current or Ex-Partner: No   Emotionally Abused: No   Physically Abused: No   Sexually Abused: No    Family History  Problem Relation Age of Onset   Arthritis Mother    Hyperlipidemia Mother    Lupus Mother    Heart attack Father 57   Hypertension Sister    Heart attack Paternal Grandmother 62   Diabetes Paternal Aunt    Stroke Maternal Grandfather 48   Cancer Paternal Grandfather        ? stomach; in 60s   Colon cancer Paternal Grandfather    Colon polyps Neg Hx    Esophageal cancer Neg Hx    Rectal  cancer Neg Hx    Stomach cancer Neg Hx    Inflammatory bowel disease Neg Hx    Liver disease Neg Hx    Pancreatic cancer Neg Hx     Current Outpatient Medications  Medication Sig Dispense Refill   aspirin EC 81 MG tablet Take 81 mg by mouth daily. Swallow whole.     atorvastatin (LIPITOR) 40 MG tablet Take 1 tablet (40 mg total) by mouth daily. 90 tablet 3   Colostrum 500 MG CAPS Take 500 mg by mouth daily.     fluticasone (FLONASE) 50 MCG/ACT nasal spray SPRAY 2 SPRAYS INTO EACH NOSTRIL EVERY DAY (Patient taking differently: Place 1 spray into both nostrils daily as needed for allergies.)  16 mL 6   Misc Natural Products (SUPER GREENS PO) Take 1 Scoop by mouth daily.      telmisartan-hydrochlorothiazide (MICARDIS HCT) 80-25 MG tablet TAKE 1 TABLET ONCE DAILY (Patient taking differently: Take 1 tablet by mouth daily. TAKE 1 TABLET ONCE DAILY) 90 tablet 3   No current facility-administered medications for this visit.    Allergies  Allergen Reactions   Ciprofloxacin Hcl     Numbness, joint pain   Cefdinir Diarrhea    Fever, chills, stomach cramps    Lisinopril Cough     REVIEW OF SYSTEMS:  [X]  denotes positive finding, [ ]  denotes negative finding Cardiac  Comments:  Chest pain or chest pressure:    Shortness of breath upon exertion:    Short of breath when lying flat:    Irregular heart rhythm:        Vascular    Pain in calf, thigh, or hip brought on by ambulation:    Pain in feet at night that wakes you up from your sleep:     Blood clot in your veins:    Leg swelling:         Pulmonary    Oxygen at home:    Productive cough:     Wheezing:         Neurologic    Sudden weakness in arms or legs:     Sudden numbness in arms or legs:     Sudden onset of difficulty speaking or slurred speech:    Temporary loss of vision in one eye:     Problems with dizziness:  X       Gastrointestinal    Blood in stool:     Vomited blood:         Genitourinary    Burning when  urinating:     Blood in urine:        Psychiatric    Major depression:         Hematologic    Bleeding problems:    Problems with blood clotting too easily:        Skin    Rashes or ulcers:        Constitutional    Fever or chills:      PHYSICAL EXAMINATION:  Vitals:   01/23/21 0956  BP: 108/64  Pulse: 63  Resp: 20  Temp: 98.3 F (36.8 C)  TempSrc: Temporal  SpO2: 95%  Weight: 201 lb 8 oz (91.4 kg)  Height: 6\' 3"  (1.905 m)    General:  WDWN in NAD; vital signs documented above Gait: Normal HENT: WNL, normocephalic Pulmonary: normal non-labored breathing , without  wheezing Cardiac: regular HR, without  Murmurs without carotid bruit, right neck incision has healed well Vascular Exam/Pulses:2+ radial, 2+ pedal pulses bilaterally. Feet warm and well perfused Musculoskeletal: no muscle wasting or atrophy  Neurologic: A&O X 3;  No focal weakness or paresthesias are detected Psychiatric:  The pt has Normal affect.   Non-Invasive Vascular Imaging:   Summary:  Right Carotid: Velocities in the right ICA are consistent with a 1-39% stenosis.   Left Carotid: Velocities in the left ICA are consistent with a 1-39% stenosis.   Vertebrals:  Bilateral vertebral arteries demonstrate antegrade flow.  Subclavians: Normal flow hemodynamics were seen in bilateral subclavian arteries.     ASSESSMENT/PLAN:: 70 y.o. male here for follow up for carotid artery stenosis. He is s/p right CEA 04/11/20 by Dr. Oneida Alar. This was done secondary to symptomatic right ICA stenosis  with retinal artery embolization. He remains with out any neurological symptoms. He is having some dizziness and associated labile blood pressures. May current regimen changed. His duplex today shows 1-39% ICA stenosis bilaterally, normal vertebral flow, and normal flow dynamics in bilateral subclavians. I do not think his dizziness is associated with his carotid disease. Gave reassurance that his neck tenderness/numbness  should continue to improve with time - Keep follow up with PCP to discuss blood pressure medications - continue Aspirin and Statin - he will follow up in 1 year with carotid duplex   [name], PA-C [facility] [phone]  Clinic MD:   Virl Cagey

## 2021-01-26 ENCOUNTER — Other Ambulatory Visit: Payer: Self-pay | Admitting: Internal Medicine

## 2021-01-29 ENCOUNTER — Encounter: Payer: Self-pay | Admitting: Internal Medicine

## 2021-01-29 MED ORDER — ATORVASTATIN CALCIUM 40 MG PO TABS
40.0000 mg | ORAL_TABLET | Freq: Every day | ORAL | 3 refills | Status: DC
Start: 2021-01-29 — End: 2021-01-30

## 2021-02-05 NOTE — Progress Notes (Signed)
Subjective:    Patient ID: Charles Tate, male    DOB: [birth year], 70 y.o.   MRN: [id number]   This visit occurred during the SARS-CoV-2 public health emergency.  Safety protocols were in place, including screening questions prior to the visit, additional usage of staff PPE, and extensive cleaning of exam room while observing appropriate contact time as indicated for disinfecting solutions.   HPI He is here for a physical exam.   His BP was low for a while - typically when he was more active - once it was 105/55, 118/60.  He did not feel good when the blood pressure was on the low side-there was some lightheadedness.  If feeling of lightheadedness has occurred less often, but still occurs on occasion.    Medications and allergies reviewed with patient and updated if appropriate.  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) 08/31/2019   Tear of MCL (medial collateral ligament) of knee, left, initial encounter 08/05/2017   Gluteal tendinitis of left buttock 08/05/2017   Left inguinal hernia 07/04/2017   Allergic rhinitis 12/28/2015   H/O: rheumatic fever 12/28/2015   Prediabetes 12/27/2015   Rising PSA level 02/21/2014   Personal history of colonic polyps 02/17/2014   BPH with obstruction/lower urinary tract symptoms 05/04/2010   Hyperlipidemia 01/07/2009   Asthma 06/04/2007   Essential hypertension 09/26/2006    Current Outpatient Medications on File Prior to Visit  Medication Sig Dispense  Refill   aspirin EC 81 MG tablet Take 81 mg by mouth daily. Swallow whole.     atorvastatin (LIPITOR) 40 MG tablet TAKE 1 TABLET BY MOUTH EVERY DAY 90 tablet 3   Colostrum 500 MG CAPS Take 500 mg by mouth daily.     fluticasone (FLONASE) 50 MCG/ACT nasal spray SPRAY 2 SPRAYS INTO EACH NOSTRIL EVERY DAY (Patient taking differently: Place 1 spray into both nostrils daily as needed for allergies.) 16 mL 6   Misc Natural Products (SUPER GREENS PO) Take 1 Scoop by mouth daily.      [DISCONTINUED] telmisartan-hydrochlorothiazide (MICARDIS HCT) 80-25 MG tablet TAKE 1 TABLET ONCE DAILY (Patient taking differently: Take 1 tablet by mouth daily. TAKE 1 TABLET ONCE DAILY) 90 tablet 3   No current facility-administered medications on file prior to visit.    Past Medical History:  Diagnosis Date   Allergy    Arthritis    left hip    GERD (gastroesophageal reflux disease)    per patient, resolved. 03/15/20   Heart murmur    adolescent rheumatic fever - developed murmur    Hyperlipidemia    Hypertension    RAD (reactive airway disease)    with RTIs   Tubular adenoma     Past Surgical History:  Procedure Laterality Date   COLONOSCOPY     colonoscopy with polypectomy  2013   Dr Ferdinand Lango, [place]   COLONOSCOPY WITH PROPOFOL N/A 07/01/2019   Procedure: COLONOSCOPY WITH PROPOFOL;  Surgeon: Irving Copas., MD;  Location: [facility];  Service: Gastroenterology;  Laterality: N/A;   COLONOSCOPY WITH PROPOFOL N/A 05/30/2020   Procedure: COLONOSCOPY WITH PROPOFOL;  Surgeon: Rush Landmark Telford Nab., MD;  Location: [facility];  Service: Gastroenterology;  Laterality: N/A;   ENDARTERECTOMY Right 04/11/2020   Procedure: Right Carotid Endarterectomy;  Surgeon: Elam Dutch, MD;  Location: [facility] OR;  Service: Vascular;  Laterality: Right;   ENDOSCOPIC MUCOSAL RESECTION N/A 07/01/2019   Procedure: ENDOSCOPIC MUCOSAL RESECTION;  Surgeon: Rush Landmark Telford Nab., MD;  Location: [facility];  Service:  Gastroenterology;  Laterality: N/A;   ENDOSCOPIC MUCOSAL RESECTION N/A 05/30/2020   Procedure: ENDOSCOPIC MUCOSAL RESECTION;  Surgeon: Rush Landmark Telford Nab., MD;  Location: [facility];  Service: Gastroenterology;  Laterality: N/A;   HEMOSTASIS CLIP PLACEMENT  07/01/2019   Procedure: HEMOSTASIS CLIP PLACEMENT;  Surgeon: Irving Copas., MD;  Location: [facility];  Service: Gastroenterology;;   POLYPECTOMY     POLYPECTOMY  07/01/2019   Procedure: POLYPECTOMY;  Surgeon: Irving Copas., MD;  Location: [facility];  Service: Gastroenterology;;   POLYPECTOMY  05/30/2020   Procedure: POLYPECTOMY;  Surgeon: Irving Copas., MD;  Location: [facility];  Service: Gastroenterology;;   SUBMUCOSAL LIFTING INJECTION  07/01/2019   Procedure: SUBMUCOSAL LIFTING INJECTION;  Surgeon: Irving Copas., MD;  Location: [facility];  Service: Gastroenterology;;   SUBMUCOSAL TATTOO INJECTION  07/01/2019   Procedure: SUBMUCOSAL TATTOO INJECTION;  Surgeon: Irving Copas., MD;  Location: [facility];  Service: Gastroenterology;;   WISDOM TOOTH EXTRACTION      Social History   Socioeconomic History   Marital status: Married    Spouse name: [name]   Number of children: 2   Years of education: Not on file   Highest education level: Not on file  Occupational History   Occupation: self employed   Tobacco Use   Smoking status: Former   Smokeless tobacco: Never   Tobacco comments:    intermittent, short term smoker as a teen. Some second hand smoke in 22s & 30s  Vaping Use   Vaping Use: Never used  Substance and Sexual Activity   Alcohol use: Yes    Comment:   very rarely   Drug use: No   Sexual activity: Not on file  Other Topics Concern   Not on file  Social History Narrative   Exercise: none regularly   Social Determinants of Health   Financial Resource Strain: Low Risk    Difficulty of Paying Living Expenses: Not hard at all  Food Insecurity: No Food  Insecurity   Worried About Charity fundraiser in the Last Year: Never true   Ran Out of Food in the Last Year: Never true  Transportation Needs: No Transportation Needs   Lack of Transportation (Medical): No   Lack of Transportation (Non-Medical): No  Physical Activity: Not on file  Stress: No Stress Concern Present   Feeling of Stress : Not at all  Social Connections: Not on file    Family History  Problem Relation Age of Onset   Arthritis Mother    Hyperlipidemia Mother    Lupus Mother    Heart attack Father 75   Hypertension Sister    Heart attack Paternal Grandmother 63   Diabetes Paternal Aunt    Stroke Maternal Grandfather 21   Cancer Paternal Grandfather        ? stomach; in 28s   Colon cancer Paternal Grandfather    Colon polyps Neg Hx    Esophageal cancer Neg Hx    Rectal cancer Neg Hx  Stomach cancer Neg Hx    Inflammatory bowel disease Neg Hx    Liver disease Neg Hx    Pancreatic cancer Neg Hx     Review of Systems  Constitutional:  Negative for chills and fever.  Eyes:  Negative for visual disturbance.  Respiratory:  Negative for cough, shortness of breath and wheezing.   Cardiovascular:  Negative for chest pain, palpitations and leg swelling.  Gastrointestinal:  Negative for abdominal pain, blood in stool, constipation, diarrhea and nausea.       No gerd  Genitourinary:  Positive for difficulty urinating (at night). Negative for dysuria and hematuria.  Musculoskeletal:  Positive for arthralgias (shoulders). Negative for back pain.  Skin:  Negative for color change and rash.  Neurological:  Positive for light-headedness (with low BP). Negative for dizziness and headaches.  Psychiatric/Behavioral:  Negative for decreased concentration. The patient is not nervous/anxious.       Objective:   Vitals:   02/06/21 0953  BP: 120/70  Pulse: 72  Temp: 98.4 F (36.9 C)  SpO2: 97%   Filed Weights   02/06/21 0953  Weight: 210 lb (95.3 kg)   Body mass  index is 26.25 kg/m.  BP Readings from Last 3 Encounters:  02/06/21 120/70  01/23/21 (!) 88/52  01/18/21 110/66    Wt Readings from Last 3 Encounters:  02/06/21 210 lb (95.3 kg)  01/23/21 201 lb 8 oz (91.4 kg)  01/18/21 203 lb (92.1 kg)     Physical Exam Constitutional: He appears well-developed and well-nourished. No distress.  HENT:  Head: Normocephalic and atraumatic.  Right Ear: External ear normal.  Left Ear: External ear normal.  Mouth/Throat: Oropharynx is clear and moist.  Normal ear canals and TM b/l  Eyes: Conjunctivae and EOM are normal.  Neck: Neck supple. No tracheal deviation present. No thyromegaly present.  No carotid bruit  Cardiovascular: Normal rate, regular rhythm, normal heart sounds and intact distal pulses.   No murmur heard. Pulmonary/Chest: Effort normal and breath sounds normal. No respiratory distress. He has no wheezes. He has no rales.  Abdominal: Soft. He exhibits no distension. There is no tenderness.  Genitourinary: deferred  Musculoskeletal: He exhibits no edema.  Lymphadenopathy:   He has no cervical adenopathy.  Skin: Skin is warm and dry. He is not diaphoretic.  Psychiatric: He has a normal mood and affect. His behavior is normal.         Assessment & Plan:   Physical exam: Screening blood work  ordered Exercise   active - no regular exercise Weight  good for age Substance abuse   none   Reviewed recommended immunizations.   Health Maintenance  Topic Date Due   Zoster Vaccines- Shingrix (1 of 2) Never done   COVID-19 Vaccine (3 - Booster for Pfizer series) 07/30/2019   TETANUS/TDAP  05/03/2020   INFLUENZA VACCINE  10/03/2020   COLONOSCOPY (Pts 45-44yrs Insurance coverage will need to be confirmed)  05/30/2025   Pneumonia Vaccine 25+ Years old  Completed   Hepatitis C Screening  Completed   HPV VACCINES  Aged Out     See Problem List for Assessment and Plan of chronic medical problems.

## 2021-02-05 NOTE — Patient Instructions (Addendum)
Blood work was ordered.     Medications changes include :   decrease dose of BP medication to 80-12.5 mg daily.  Your prescription(s) have been submitted to your pharmacy. Please take as directed and contact our office if you believe you are having problem(s) with the medication(s).    Please followup in 6 months    Health Maintenance, Male Adopting a healthy lifestyle and getting preventive care are important in promoting health and wellness. Ask your health care provider about: The right schedule for you to have regular tests and exams. Things you can do on your own to prevent diseases and keep yourself healthy. What should I know about diet, weight, and exercise? Eat a healthy diet  Eat a diet that includes plenty of vegetables, fruits, low-fat dairy products, and lean protein. Do not eat a lot of foods that are high in solid fats, added sugars, or sodium. Maintain a healthy weight Body mass index (BMI) is a measurement that can be used to identify possible weight problems. It estimates body fat based on height and weight. Your health care provider can help determine your BMI and help you achieve or maintain a healthy weight. Get regular exercise Get regular exercise. This is one of the most important things you can do for your health. Most adults should: Exercise for at least 150 minutes each week. The exercise should increase your heart rate and make you sweat (moderate-intensity exercise). Do strengthening exercises at least twice a week. This is in addition to the moderate-intensity exercise. Spend less time sitting. Even light physical activity can be beneficial. Watch cholesterol and blood lipids Have your blood tested for lipids and cholesterol at 71 years of age, then have this test every 5 years. You may need to have your cholesterol levels checked more often if: Your lipid or cholesterol levels are high. You are older than 71 years of age. You are at high risk for  heart disease. What should I know about cancer screening? Many types of cancers can be detected early and may often be prevented. Depending on your health history and family history, you may need to have cancer screening at various ages. This may include screening for: Colorectal cancer. Prostate cancer. Skin cancer. Lung cancer. What should I know about heart disease, diabetes, and high blood pressure? Blood pressure and heart disease High blood pressure causes heart disease and increases the risk of stroke. This is more likely to develop in people who have high blood pressure readings or are overweight. Talk with your health care provider about your target blood pressure readings. Have your blood pressure checked: Every 3-5 years if you are 71-109 years of age. Every year if you are 19 years old or older. If you are between the ages of 86 and 74 and are a current or former smoker, ask your health care provider if you should have a one-time screening for abdominal aortic aneurysm (AAA). Diabetes Have regular diabetes screenings. This checks your fasting blood sugar level. Have the screening done: Once every three years after age 75 if you are at a normal weight and have a low risk for diabetes. More often and at a younger age if you are overweight or have a high risk for diabetes. What should I know about preventing infection? Hepatitis B If you have a higher risk for hepatitis B, you should be screened for this virus. Talk with your health care provider to find out if you are at risk for hepatitis B  infection. Hepatitis C Blood testing is recommended for: Everyone born from [birth year] through 1966. Anyone with known risk factors for hepatitis C. Sexually transmitted infections (STIs) You should be screened each year for STIs, including gonorrhea and chlamydia, if: You are sexually active and are younger than 71 years of age. You are older than 71 years of age and your health care provider  tells you that you are at risk for this type of infection. Your sexual activity has changed since you were last screened, and you are at increased risk for chlamydia or gonorrhea. Ask your health care provider if you are at risk. Ask your health care provider about whether you are at high risk for HIV. Your health care provider may recommend a prescription medicine to help prevent HIV infection. If you choose to take medicine to prevent HIV, you should first get tested for HIV. You should then be tested every 3 months for as long as you are taking the medicine. Follow these instructions at home: Alcohol use Do not drink alcohol if your health care provider tells you not to drink. If you drink alcohol: Limit how much you have to 0-2 drinks a day. Know how much alcohol is in your drink. In the [place] one drink equals one 12 oz bottle of beer (355 mL), one 5 oz glass of wine (148 mL), or one 1 oz glass of hard liquor (44 mL). Lifestyle Do not use any products that contain nicotine or tobacco. These products include cigarettes, chewing tobacco, and vaping devices, such as e-cigarettes. If you need help quitting, ask your health care provider. Do not use street drugs. Do not share needles. Ask your health care provider for help if you need support or information about quitting drugs. General instructions Schedule regular health, dental, and eye exams. Stay current with your vaccines. Tell your health care provider if: You often feel depressed. You have ever been abused or do not feel safe at home. Summary Adopting a healthy lifestyle and getting preventive care are important in promoting health and wellness. Follow your health care provider's instructions about healthy diet, exercising, and getting tested or screened for diseases. Follow your health care provider's instructions on monitoring your cholesterol and blood pressure. This information is not intended to replace advice given to you by  your health care provider. Make sure you discuss any questions you have with your health care provider. Document Revised: 07/11/2020 Document Reviewed: 07/11/2020 Elsevier Patient Education  [place].

## 2021-02-06 ENCOUNTER — Encounter: Payer: Self-pay | Admitting: Internal Medicine

## 2021-02-06 ENCOUNTER — Ambulatory Visit (INDEPENDENT_AMBULATORY_CARE_PROVIDER_SITE_OTHER): Payer: Medicare HMO | Admitting: Internal Medicine

## 2021-02-06 ENCOUNTER — Other Ambulatory Visit: Payer: Self-pay

## 2021-02-06 VITALS — BP 120/70 | HR 72 | Temp 98.4°F | Ht 75.0 in | Wt 201.0 lb

## 2021-02-06 DIAGNOSIS — I700 Atherosclerosis of aorta: Secondary | ICD-10-CM | POA: Diagnosis not present

## 2021-02-06 DIAGNOSIS — Z0000 Encounter for general adult medical examination without abnormal findings: Secondary | ICD-10-CM

## 2021-02-06 DIAGNOSIS — R7303 Prediabetes: Secondary | ICD-10-CM

## 2021-02-06 DIAGNOSIS — I10 Essential (primary) hypertension: Secondary | ICD-10-CM

## 2021-02-06 DIAGNOSIS — Z125 Encounter for screening for malignant neoplasm of prostate: Secondary | ICD-10-CM

## 2021-02-06 DIAGNOSIS — E782 Mixed hyperlipidemia: Secondary | ICD-10-CM | POA: Diagnosis not present

## 2021-02-06 LAB — CBC WITH DIFFERENTIAL/PLATELET
Basophils Absolute: 0 10*3/uL (ref 0.0–0.1)
Basophils Relative: 0.7 % (ref 0.0–3.0)
Eosinophils Absolute: 0.1 10*3/uL (ref 0.0–0.7)
Eosinophils Relative: 1.7 % (ref 0.0–5.0)
HCT: 43.1 % (ref 39.0–52.0)
Hemoglobin: 14.4 g/dL (ref 13.0–17.0)
Lymphocytes Relative: 17.2 % (ref 12.0–46.0)
Lymphs Abs: 0.9 10*3/uL (ref 0.7–4.0)
MCHC: 33.5 g/dL (ref 30.0–36.0)
MCV: 88.7 fl (ref 78.0–100.0)
Monocytes Absolute: 0.4 10*3/uL (ref 0.1–1.0)
Monocytes Relative: 8.2 % (ref 3.0–12.0)
Neutro Abs: 3.7 10*3/uL (ref 1.4–7.7)
Neutrophils Relative %: 72.2 % (ref 43.0–77.0)
Platelets: 194 10*3/uL (ref 150.0–400.0)
RBC: 4.85 Mil/uL (ref 4.22–5.81)
RDW: 12.9 % (ref 11.5–15.5)
WBC: 5.1 10*3/uL (ref 4.0–10.5)

## 2021-02-06 LAB — LIPID PANEL
Cholesterol: 130 mg/dL (ref 0–200)
HDL: 36.8 mg/dL — ABNORMAL LOW (ref >39.00)
LDL Cholesterol: 73 mg/dL (ref 0–99)
NonHDL: 93.17
Total CHOL/HDL Ratio: 4
Triglycerides: 99 mg/dL (ref 0.0–149.0)
VLDL: 19.8 mg/dL (ref 0.0–40.0)

## 2021-02-06 LAB — TSH: TSH: 1.46 u[IU]/mL (ref 0.35–5.50)

## 2021-02-06 LAB — COMPREHENSIVE METABOLIC PANEL
ALT: 18 U/L (ref 0–53)
AST: 16 U/L (ref 0–37)
Albumin: 4.2 g/dL (ref 3.5–5.2)
Alkaline Phosphatase: 90 U/L (ref 39–117)
BUN: 14 mg/dL (ref 6–23)
CO2: 30 mEq/L (ref 19–32)
Calcium: 9.4 mg/dL (ref 8.4–10.5)
Chloride: 101 mEq/L (ref 96–112)
Creatinine, Ser: 0.76 mg/dL (ref 0.40–1.50)
GFR: 90.37 mL/min (ref >60.00)
Glucose, Bld: 94 mg/dL (ref 70–99)
Potassium: 4.2 mEq/L (ref 3.5–5.1)
Sodium: 138 mEq/L (ref 135–145)
Total Bilirubin: 0.8 mg/dL (ref 0.2–1.2)
Total Protein: 6.6 g/dL (ref 6.0–8.3)

## 2021-02-06 LAB — HEMOGLOBIN A1C: Hgb A1c MFr Bld: 6 % (ref 4.6–6.5)

## 2021-02-06 LAB — PSA, MEDICARE: PSA: 3.68 ng/mL (ref 0.10–4.00)

## 2021-02-06 MED ORDER — TELMISARTAN-HCTZ 80-12.5 MG PO TABS: 1.0000 | ORAL_TABLET | Freq: Every day | ORAL | 3 refills | Status: DC

## 2021-02-06 NOTE — Assessment & Plan Note (Signed)
Chronic Check a1c Low sugar / carb diet Stressed regular exercise  

## 2021-02-06 NOTE — Assessment & Plan Note (Signed)
Chronic Check lipid panel  Continue atorvastatin 40 mg daily Regular exercise and healthy diet encouraged  

## 2021-02-06 NOTE — Assessment & Plan Note (Signed)
Chronic Continue atorvastatin 40 mg daily Healthy diet, regular exercise encouraged Ck lipids

## 2021-02-06 NOTE — Assessment & Plan Note (Addendum)
Chronic BP too well controlled - he has had some low BP readings and ligthheadedness Decrease micardis- hct to 80-12.5 mg daily Monitor BP at home and monitor lightheadedness cmp

## 2021-02-08 ENCOUNTER — Telehealth: Payer: Self-pay

## 2021-02-08 NOTE — Progress Notes (Signed)
Chronic Care Management Pharmacy Assistant   Name: Charles Tate  MRN: [id number] DOB: 12/22/1950    Reason for Encounter: Disease State   Conditions to be addressed/monitored: HTN   Recent office visits:  02/06/21 [name], MD-PCP (Annual Exam) Blood work was ordered.  Medications changes include :   decrease dose of BP medication Telmisartan-hctz to 80-12.5 mg daily. 01/23/21 [name], PA-C-Family Medicine (Carotid stenosis, right) No orders or med changes Recent consult visits:  01/18/21 [name], DO-Sports Medicine (Pain in joint of right shoulder)   Hospital visits:  None in previous 6 months  Medications: Outpatient Encounter Medications as of 02/08/2021  Medication Sig Note   aspirin EC 81 MG tablet Take 81 mg by mouth daily. Swallow whole.    atorvastatin (LIPITOR) 40 MG tablet TAKE 1 TABLET BY MOUTH EVERY DAY    Colostrum 500 MG CAPS Take 500 mg by mouth daily. 05/16/2020: Pt ran out, needs to get more   fluticasone (FLONASE) 50 MCG/ACT nasal spray SPRAY 2 SPRAYS INTO EACH NOSTRIL EVERY DAY (Patient taking differently: Place 1 spray into both nostrils daily as needed for allergies.)    Misc Natural Products (SUPER GREENS PO) Take 1 Scoop by mouth daily.     telmisartan-hydrochlorothiazide (MICARDIS HCT) 80-12.5 MG tablet Take 1 tablet by mouth daily.    No facility-administered encounter medications on file as of 02/08/2021.   Reviewed chart prior to disease state call. Spoke with patient regarding BP  Recent Office Vitals: BP Readings from Last 3 Encounters:  02/06/21 120/70  01/23/21 (!) 88/52  01/18/21 110/66   Pulse Readings from Last 3 Encounters:  02/06/21 72  01/23/21 63  01/18/21 72    Wt Readings from Last 3 Encounters:  02/06/21 201 lb (91.2 kg)  01/23/21 201 lb 8 oz (91.4 kg)  01/18/21 203 lb (92.1 kg)     Kidney Function Lab Results  Component Value Date/Time   CREATININE 0.76 02/06/2021 10:56 AM   CREATININE 0.86  06/27/2020 02:16 PM   GFR 90.37 02/06/2021 10:56 AM   GFRNONAA >60 04/12/2020 01:53 AM   GFRAA 129 06/28/2006 10:44 AM    BMP Latest Ref Rng & Units 02/06/2021 06/27/2020 04/12/2020  Glucose 70 - 99 mg/dL 94 79 130(H)  BUN 6 - 23 mg/dL 14 19 19   Creatinine 0.40 - 1.50 mg/dL 0.76 0.86 0.94  Sodium 135 - 145 mEq/L 138 139 136  Potassium 3.5 - 5.1 mEq/L 4.2 4.0 3.6  Chloride 96 - 112 mEq/L 101 101 99  CO2 19 - 32 mEq/L 30 28 27   Calcium 8.4 - 10.5 mg/dL 9.4 9.4 8.7(L)    Current antihypertensive regimen:  Telmisartan-HCTZ 80-12.5 mg daily How often are you checking your Blood Pressure? 1-2x per week Current home BP readings: Patient states that his blood pressure has been running normal What recent interventions/DTPs have been made by any provider to improve Blood Pressure control since last CPP Visit: Dr. [name] decreased blood pressure med because his blood pressure was running really low Any recent hospitalizations or ED visits since last visit with CPP? No What diet changes have been made to improve Blood Pressure Control?  Patient states that he has not made any changes to diet What exercise is being done to improve your Blood Pressure Control?  Patient states that he has loss 15lbs in the last 8 months  Adherence Review: Is the patient currently on ACE/ARB medication? Yes Does the patient have >5 day gap between  last estimated fill dates? No   Care Gaps: Colonoscopy-NA Diabetic Foot Exam-NA Ophthalmology-NA Dexa Scan - NA Annual Well Visit - 02/06/21 Micro albumin-NA Hemoglobin A1c- 02/06/21  Star Rating Drugs: Telmisartan-HCTZ 80-12.5 mg-last fill 02/06/21 90ds Atorvastatin 40 mg-last fill 01/29/21 90 ds  [facility] [facility] Pharmacist Assistant [phone]

## 2021-02-15 ENCOUNTER — Telehealth: Payer: Medicare HMO

## 2021-02-23 ENCOUNTER — Other Ambulatory Visit: Payer: Self-pay | Admitting: Internal Medicine

## 2021-03-09 NOTE — Progress Notes (Signed)
none

## 2021-03-10 ENCOUNTER — Ambulatory Visit: Payer: Medicare HMO | Admitting: Interventional Cardiology

## 2021-03-10 ENCOUNTER — Other Ambulatory Visit: Payer: Self-pay

## 2021-03-10 ENCOUNTER — Encounter: Payer: Self-pay | Admitting: Interventional Cardiology

## 2021-03-10 VITALS — BP 118/72 | HR 64 | Ht 75.0 in | Wt 202.6 lb

## 2021-03-10 DIAGNOSIS — R931 Abnormal findings on diagnostic imaging of heart and coronary circulation: Secondary | ICD-10-CM

## 2021-03-10 DIAGNOSIS — I639 Cerebral infarction, unspecified: Secondary | ICD-10-CM

## 2021-03-10 DIAGNOSIS — H34231 Retinal artery branch occlusion, right eye: Secondary | ICD-10-CM | POA: Diagnosis not present

## 2021-03-10 DIAGNOSIS — E782 Mixed hyperlipidemia: Secondary | ICD-10-CM | POA: Diagnosis not present

## 2021-03-10 DIAGNOSIS — I6521 Occlusion and stenosis of right carotid artery: Secondary | ICD-10-CM

## 2021-03-10 DIAGNOSIS — I10 Essential (primary) hypertension: Secondary | ICD-10-CM

## 2021-03-10 DIAGNOSIS — R7303 Prediabetes: Secondary | ICD-10-CM

## 2021-03-10 NOTE — Progress Notes (Signed)
Cardiology Office Note:    Date:  03/10/2021   ID:  Charles Tate, DOB 04-13-1950, MRN [id number]  PCP:  [name], MD  Cardiologist:  None   Referring MD: [name], MD   Chief Complaint  Patient presents with   Follow-up    History of Present Illness:    Charles Tate is a 71 y.o. 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 He has h/o CAD (elevated Cor Calc. Score), hyperlipidemia, primary hypertension, and rheumatic fever.  Patient is doing well.  He denies cardiac symptoms.  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  No chest discomfort or intolerance of physical physical activity.  Past Medical History:  Diagnosis Date   Allergy    Arthritis    left hip    GERD (gastroesophageal reflux disease)    per patient, resolved. 03/15/20   Heart murmur    adolescent rheumatic fever - developed murmur    Hyperlipidemia    Hypertension    RAD (reactive airway disease)    with RTIs   Tubular adenoma     Past Surgical History:  Procedure Laterality Date   COLONOSCOPY     colonoscopy with polypectomy  2013   Dr Ferdinand Lango, [place]   COLONOSCOPY WITH PROPOFOL N/A 07/01/2019   Procedure: COLONOSCOPY WITH PROPOFOL;  Surgeon: Irving Copas., MD;  Location: [facility];  Service: Gastroenterology;  Laterality: N/A;   COLONOSCOPY WITH PROPOFOL N/A 05/30/2020   Procedure: COLONOSCOPY WITH PROPOFOL;  Surgeon: Rush Landmark Telford Nab., MD;  Location: [facility];  Service: Gastroenterology;  Laterality: N/A;   ENDARTERECTOMY Right 04/11/2020   Procedure: Right Carotid Endarterectomy;  Surgeon: Elam Dutch, MD;  Location[facility];  Service: Vascular;  Laterality: Right;   ENDOSCOPIC MUCOSAL RESECTION N/A 07/01/2019   Procedure: ENDOSCOPIC MUCOSAL RESECTION;  Surgeon: Rush Landmark Telford Nab., MD;  Location: [facility];  Service: Gastroenterology;  Laterality: N/A;   ENDOSCOPIC  MUCOSAL RESECTION N/A 05/30/2020   Procedure: ENDOSCOPIC MUCOSAL RESECTION;  Surgeon: Rush Landmark Telford Nab., MD;  Location: [facility];  Service: Gastroenterology;  Laterality: N/A;   HEMOSTASIS CLIP PLACEMENT  07/01/2019   Procedure: HEMOSTASIS CLIP PLACEMENT;  Surgeon: Irving Copas., MD;  Location: [facility];  Service: Gastroenterology;;   POLYPECTOMY     POLYPECTOMY  07/01/2019   Procedure: POLYPECTOMY;  Surgeon: Irving Copas., MD;  Location: [facility];  Service: Gastroenterology;;   POLYPECTOMY  05/30/2020   Procedure: POLYPECTOMY;  Surgeon: Irving Copas., MD;  Location: [facility];  Service: Gastroenterology;;   SUBMUCOSAL LIFTING INJECTION  07/01/2019   Procedure: SUBMUCOSAL LIFTING INJECTION;  Surgeon: Irving Copas., MD;  Location: [facility];  Service: Gastroenterology;;   SUBMUCOSAL TATTOO INJECTION  07/01/2019   Procedure: SUBMUCOSAL TATTOO INJECTION;  Surgeon: Irving Copas., MD;  Location: [facility];  Service: Gastroenterology;;   WISDOM TOOTH EXTRACTION      Current Medications: Current Meds  Medication Sig   aspirin EC 81 MG tablet Take 81 mg by mouth daily. Swallow whole.   atorvastatin (LIPITOR) 40 MG tablet TAKE 1 TABLET BY MOUTH EVERY DAY   Colostrum 500 MG CAPS Take 500 mg by mouth daily.   fluticasone (FLONASE) 50 MCG/ACT nasal spray SPRAY 2 SPRAYS INTO EACH NOSTRIL EVERY DAY (Patient taking differently: Place 1 spray into both nostrils daily as needed for allergies.)   Misc Natural Products (SUPER GREENS PO)  Take 1 Scoop by mouth daily.    telmisartan-hydrochlorothiazide (MICARDIS HCT) 80-12.5 MG tablet Take 1 tablet by mouth daily.     Allergies:   Ciprofloxacin hcl, Cefdinir, and Lisinopril   Social History   Socioeconomic History   Marital status: Married    Spouse name: [name]   Number of children: 2   Years of education: Not on file   Highest education level: Not on file  Occupational History    Occupation: self employed   Tobacco Use   Smoking status: Former   Smokeless tobacco: Never   Tobacco comments:    intermittent, short term smoker as a teen. Some second hand smoke in 67s & 30s  Vaping Use   Vaping Use: Never used  Substance and Sexual Activity   Alcohol use: Yes    Comment:   very rarely   Drug use: No   Sexual activity: Not on file  Other Topics Concern   Not on file  Social History Narrative   Exercise: none regularly   Social Determinants of Health   Financial Resource Strain: Low Risk    Difficulty of Paying Living Expenses: Not hard at all  Food Insecurity: No Food Insecurity   Worried About Charity fundraiser in the Last Year: Never true   Ran Out of Food in the Last Year: Never true  Transportation Needs: No Transportation Needs   Lack of Transportation (Medical): No   Lack of Transportation (Non-Medical): No  Physical Activity: Not on file  Stress: No Stress Concern Present   Feeling of Stress : Not at all  Social Connections: Not on file     Family History: The patient's family history includes Arthritis in his mother; Cancer in his paternal grandfather; Colon cancer in his paternal grandfather; Diabetes in his paternal aunt; Heart attack (age of onset: 61) in his father and paternal grandmother; Hyperlipidemia in his mother; Hypertension in his sister; Lupus in his mother; Stroke (age of onset: 62) in his maternal grandfather. There is no history of Colon polyps, Esophageal cancer, Rectal cancer, Stomach cancer, Inflammatory bowel disease, Liver disease, or Pancreatic cancer.  ROS:   Please see the history of present illness.    He denies medication side effects.  All other systems reviewed and are negative.  EKGs/Labs/Other Studies Reviewed:    The following studies were reviewed today:  Carotid a Doppler study performed November 2022: Summary:  Right Carotid: Velocities in the right ICA are consistent with a 1-39%  stenosis.   Left  Carotid: Velocities in the left ICA are consistent with a 1-39%  stenosis.   Vertebrals:  Bilateral vertebral arteries demonstrate antegrade flow.  Subclavians: Normal flow hemodynamics were seen in bilateral subclavian               arteries.   *See table(s) above for measurements and observations.       Electronically signed by Orlie Pollen on 01/23/2021 at 4:51:42 PM.   EKG:  EKG normal sinus rhythm, normal possible left atrial abnormality, no acute changes.  Prominent voltage.  Recent Labs: 02/06/2021: ALT 18; BUN 14; Creatinine, Ser 0.76; Hemoglobin 14.4; Platelets 194.0; Potassium 4.2; Sodium 138; TSH 1.46  Recent Lipid Panel    Component Value Date/Time   CHOL 130 02/06/2021 1056   CHOL 175 02/10/2013 0824   TRIG 99.0 02/06/2021 1056   TRIG 131 02/10/2013 0824   HDL 36.80 (L) 02/06/2021 1056   HDL 31 (L) 02/10/2013 0824   CHOLHDL 4 02/06/2021 1056  VLDL 19.8 02/06/2021 1056   LDLCALC 73 02/06/2021 1056   LDLCALC 83 09/29/2019 1604   LDLCALC 118 (H) 02/10/2013 0824   LDLDIRECT 161.0 12/05/2018 1422    Physical Exam:    VS:  BP 118/72    Pulse 64    Ht 6\' 3"  (1.905 m)    Wt 202 lb 9.6 oz (91.9 kg)    SpO2 97%    BMI 25.32 kg/m     Wt Readings from Last 3 Encounters:  03/10/21 202 lb 9.6 oz (91.9 kg)  02/06/21 201 lb (91.2 kg)  01/23/21 201 lb 8 oz (91.4 kg)     GEN: Slender and not excessively overweight. No acute distress HEENT: Normal NECK: No JVD. LYMPHATICS: No lymphadenopathy CARDIAC: No murmur. RRR no gallop, or edema. VASCULAR:  Normal Pulses. No bruits. RESPIRATORY:  Clear to auscultation without rales, wheezing or rhonchi  ABDOMEN: Soft, non-tender, non-distended, No pulsatile mass, MUSCULOSKELETAL: No deformity  SKIN: Warm and dry NEUROLOGIC:  Alert and oriented x 3 PSYCHIATRIC:  Normal affect   ASSESSMENT:    1. 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)   2. Essential hypertension   3. Mixed hyperlipidemia   4. Acute  embolic stroke (HCC)   5. Carotid stenosis, right   6. Branch retinal artery occlusion, right eye   7. Prediabetes    PLAN:    In order of problems listed above:  Addressing primary prevention.  No symptoms. Very good blood pressure control on current regimen. Continue high intensity statin therapy, atorvastatin 40 mg/day. No recurrence Benign-appearing carotid Doppler noted above. Not been distracted by his vision disturbance. Remains prediabetic.  Recent weight loss and physical activity are helping.  Overall education and awareness concerning secondary risk prevention was discussed in detail: LDL less than 70, hemoglobin A1c less than 7, blood pressure target less than 130/80 mmHg, >150 minutes of moderate aerobic activity per week, avoidance of smoking, weight control (via diet and exercise), and continued surveillance/management of/for obstructive sleep apnea.  Will see as needed.  We reviewed secondary prevention.  No questions.  Cautioned that if he develops any evidence or concern for ischemic chest pain that he should come in immediately.  We will otherwise see him as needed.   Medication Adjustments/Labs and Tests Ordered: Current medicines are reviewed at length with the patient today.  Concerns regarding medicines are outlined above.  Orders Placed This Encounter  Procedures   EKG 12-Lead   No orders of the defined types were placed in this encounter.   Patient Instructions  Medication Instructions:  Your physician recommends that you continue on your current medications as directed. Please refer to the Current Medication list given to you today.  *If you need a refill on your cardiac medications before your next appointment, please call your pharmacy*   Lab Work: None If you have labs (blood work) drawn today and your tests are completely normal, you will receive your results only by: [place] (if you have MyChart) OR A paper copy in the mail If you have  any lab test that is abnormal or we need to change your treatment, we will call you to review the results.   Testing/Procedures: None   Follow-Up: At [facility], you and your health needs are our priority.  As part of our continuing mission to provide you with exceptional heart care, we have created designated Provider Care Teams.  These Care Teams include your primary Cardiologist (physician) and Advanced Practice Providers (  APPs -  Physician Assistants and Nurse Practitioners) who all work together to provide you with the care you need, when you need it.  We recommend signing up for the patient portal called "MyChart".  Sign up information is provided on this After Visit Summary.  MyChart is used to connect with patients for Virtual Visits (Telemedicine).  Patients are able to view lab/test results, encounter notes, upcoming appointments, etc.  Non-urgent messages can be sent to your provider as well.   To learn more about what you can do with MyChart, go to [email or web address].    Your next appointment:   As needed  The format for your next appointment:   In Person  Provider:   [facility]. Blenda Bridegroom, MD    Other Instructions     Signed, Sinclair Grooms, MD  03/10/2021 9:23 AM    [place]

## 2021-03-10 NOTE — Patient Instructions (Signed)
Medication Instructions:  Your physician recommends that you continue on your current medications as directed. Please refer to the Current Medication list given to you today.  *If you need a refill on your cardiac medications before your next appointment, please call your pharmacy*   Lab Work: None If you have labs (blood work) drawn today and your tests are completely normal, you will receive your results only by: [place] (if you have MyChart) OR A paper copy in the mail If you have any lab test that is abnormal or we need to change your treatment, we will call you to review the results.   Testing/Procedures: None   Follow-Up: At [facility], you and your health needs are our priority.  As part of our continuing mission to provide you with exceptional heart care, we have created designated Provider Care Teams.  These Care Teams include your primary Cardiologist (physician) and Advanced Practice Providers (APPs -  Physician Assistants and Nurse Practitioners) who all work together to provide you with the care you need, when you need it.  We recommend signing up for the patient portal called "MyChart".  Sign up information is provided on this After Visit Summary.  MyChart is used to connect with patients for Virtual Visits (Telemedicine).  Patients are able to view lab/test results, encounter notes, upcoming appointments, etc.  Non-urgent messages can be sent to your provider as well.   To learn more about what you can do with MyChart, go to [email or web address].    Your next appointment:   As needed  The format for your next appointment:   In Person  Provider:   [facility]. Blenda Bridegroom, MD    Other Instructions

## 2021-03-15 DIAGNOSIS — D485 Neoplasm of uncertain behavior of skin: Secondary | ICD-10-CM | POA: Diagnosis not present

## 2021-03-15 DIAGNOSIS — D2239 Melanocytic nevi of other parts of face: Secondary | ICD-10-CM | POA: Diagnosis not present

## 2021-03-15 DIAGNOSIS — L578 Other skin changes due to chronic exposure to nonionizing radiation: Secondary | ICD-10-CM | POA: Diagnosis not present

## 2021-03-15 DIAGNOSIS — D225 Melanocytic nevi of trunk: Secondary | ICD-10-CM | POA: Diagnosis not present

## 2021-03-17 ENCOUNTER — Telehealth: Payer: Medicare HMO

## 2021-03-17 ENCOUNTER — Telehealth: Payer: Self-pay

## 2021-03-17 NOTE — Telephone Encounter (Signed)
Attempted to call patient back, no answer, advised for patient to return call to reschedule appointment

## 2021-03-17 NOTE — Progress Notes (Incomplete)
Chronic Care Management Pharmacy Note  03/17/2021 Name:  Charles Tate MRN:  [id number] DOB:  [birth year]  Summary: ***  Recommendations/Changes made from today's visit: ***  Plan: ***  Subjective: Charles Tate is an 71 y.o. year old male who is a primary patient of Burns, Claudina Lick, MD.  The CCM team was consulted for assistance with disease management and care coordination needs.    Engaged with patient by telephone for follow up visit in response to provider referral for pharmacy case management and/or care coordination services.   Consent to Services:  The patient was given the following information about Chronic Care Management services today, agreed to services, and gave verbal consent: 1. CCM service includes personalized support from designated clinical staff supervised by the primary care provider, including individualized plan of care and coordination with other care providers 2. 24/7 contact phone numbers for assistance for urgent and routine care needs. 3. Service will only be billed when office clinical staff spend 20 minutes or more in a month to coordinate care. 4. Only one practitioner may furnish and bill the service in a calendar month. 5.The patient may stop CCM services at any time (effective at the end of the month) by phone call to the office staff. 6. The patient will be responsible for cost sharing (co-pay) of up to 20% of the service fee (after annual deductible is met). Patient agreed to services and consent obtained.  Patient Care Team: [name], MD as PCP - General (Internal Medicine) Tomasa Blase, [facility] (Pharmacist)  Recent office visits: 02/06/2021 - Dr. [name] - telmisartan - hctz - reduced to 80-12.29m daily - follow up in 6 months   Recent consult visits: 03/10/2020 - Dr. STamala Julian- Cardiology - no changes to medications, follow up as needed  01/23/2021 - Corrina Baglia PA-C - vascular - no changes to medications - f/u in 1 year  01/18/2021 -  Dr. STamala Julian- Sports medicine - pain or right shoulder - restart PT - follow up in 2 months  11/08/2020 - Dr. RZadie Rhine- ophthalmology - stable findings  - no changes to medications   Hospital visits: None in previous 6 months  Objective:  Lab Results  Component Value Date   CREATININE 0.76 02/06/2021   BUN 14 02/06/2021   GFR 90.37 02/06/2021   GFRNONAA >60 04/12/2020   GFRAA 129 06/28/2006   NA 138 02/06/2021   K 4.2 02/06/2021   CALCIUM 9.4 02/06/2021   CO2 30 02/06/2021   GLUCOSE 94 02/06/2021    Lab Results  Component Value Date/Time   HGBA1C 6.0 02/06/2021 10:56 AM   HGBA1C 6.0 06/27/2020 02:16 PM   GFR 90.37 02/06/2021 10:56 AM   GFR 87.43 06/27/2020 02:16 PM    Last diabetic Eye exam:  No results found for: HMDIABEYEEXA  Last diabetic Foot exam:  No results found for: HMDIABFOOTEX   Lab Results  Component Value Date   CHOL 130 02/06/2021   HDL 36.80 (L) 02/06/2021   LDLCALC 73 02/06/2021   LDLDIRECT 161.0 12/05/2018   TRIG 99.0 02/06/2021   CHOLHDL 4 02/06/2021    Hepatic Function Latest Ref Rng & Units 02/06/2021 06/27/2020 03/15/2020  Total Protein 6.0 - 8.3 g/dL 6.6 7.4 7.0  Albumin 3.5 - 5.2 g/dL 4.2 4.2 3.9  AST 0 - 37 U/L 16 18 23   ALT 0 - 53 U/L 18 18 27   Alk Phosphatase 39 - 117 U/L 90 90 77  Total Bilirubin 0.2 -  1.2 mg/dL 0.8 0.6 0.7  Bilirubin, Direct 0.0 - 0.2 mg/dL - - -    Lab Results  Component Value Date/Time   TSH 1.46 02/06/2021 10:56 AM   TSH 1.71 02/03/2020 09:29 AM    CBC Latest Ref Rng & Units 02/06/2021 04/12/2020 04/04/2020  WBC 4.0 - 10.5 K/uL 5.1 8.4 6.1  Hemoglobin 13.0 - 17.0 g/dL 14.4 12.1(L) 14.4  Hematocrit 39.0 - 52.0 % 43.1 35.0(L) 41.5  Platelets 150.0 - 400.0 K/uL 194.0 163 201    No results found for: VD25OH  Clinical ASCVD: {YES/NO:21197} The ASCVD Risk score (Arnett DK, et al., 2019) failed to calculate for the following reasons:   The patient has a prior MI or stroke diagnosis    Depression screen [facility] 2/9  04/20/2020 01/20/2020 12/05/2018  Decreased Interest 0 0 0  Down, Depressed, Hopeless 0 0 0  PHQ - 2 Score 0 0 0    Social History   Tobacco Use  Smoking Status Former  Smokeless Tobacco Never  Tobacco Comments   intermittent, short term smoker as a teen. Some second hand smoke in 20s & 30s   BP Readings from Last 3 Encounters:  03/10/21 118/72  02/06/21 120/70  01/23/21 (!) 88/52   Pulse Readings from Last 3 Encounters:  03/10/21 64  02/06/21 72  01/23/21 63   Wt Readings from Last 3 Encounters:  03/10/21 202 lb 9.6 oz (91.9 kg)  02/06/21 201 lb (91.2 kg)  01/23/21 201 lb 8 oz (91.4 kg)   BMI Readings from Last 3 Encounters:  03/10/21 25.32 kg/m  02/06/21 25.12 kg/m  01/23/21 25.19 kg/m    Assessment/Interventions: Review of patient past medical history, allergies, medications, health status, including review of consultants reports, laboratory and other test data, was performed as part of comprehensive evaluation and provision of chronic care management services.   SDOH:  (Social Determinants of Health) assessments and interventions performed: Yes  SDOH Screenings   Alcohol Screen: Not on file  Depression (PHQ2-9): Low Risk    PHQ-2 Score: 0  Financial Resource Strain: Low Risk    Difficulty of Paying Living Expenses: Not hard at all  Food Insecurity: No Food Insecurity   Worried About Charity fundraiser in the Last Year: Never true   Ran Out of Food in the Last Year: Never true  Housing: Low Risk    Last Housing Risk Score: 0  Physical Activity: Not on file  Social Connections: Not on file  Stress: No Stress Concern Present   Feeling of Stress : Not at all  Tobacco Use: Medium Risk   Smoking Tobacco Use: Former   Smokeless Tobacco Use: Never   Passive Exposure: Not on file  Transportation Needs: No Transportation Needs   Lack of Transportation (Medical): No   Lack of Transportation (Non-Medical): No    CCM Care Plan  Allergies  Allergen Reactions    Ciprofloxacin Hcl     Numbness, joint pain   Cefdinir Diarrhea    Fever, chills, stomach cramps    Lisinopril Cough    Medications Reviewed Today     Reviewed by Belva Crome, MD (Physician) on 03/10/21 at [phone]  Med List Status: <None>   Medication Order Taking? Sig Documenting Provider Last Dose Status Informant  aspirin EC 81 MG tablet [id number] Yes Take 81 mg by mouth daily. Swallow whole. [provider] Taking Active Self  atorvastatin (LIPITOR) 40 MG tablet [id number] Yes TAKE 1 TABLET BY MOUTH EVERY DAY Burns, Marzetta Board  J, MD Taking Active   Colostrum 500 MG CAPS [id number] Yes Take 500 mg by mouth daily. [provider] Taking Active Self           Med Note Jilda Roche A   Mon May 16, 2020  3:01 PM) Pt ran out, needs to get more  fluticasone (FLONASE) 50 MCG/ACT nasal spray [id number] Yes SPRAY 2 SPRAYS INTO EACH NOSTRIL EVERY DAY  Patient taking differently: Place 1 spray into both nostrils daily as needed for allergies.   [name], MD Taking Active   Misc Natural Products (SUPER [place] [id number] Yes Take 1 Scoop by mouth daily.  [provider] Taking Active Self  telmisartan-hydrochlorothiazide (MICARDIS HCT) 80-12.5 MG tablet [id number] Yes Take 1 tablet by mouth daily. [name], MD Taking Active             Patient Active Problem List   Diagnosis Date Noted   Nuclear sclerotic cataract of both eyes 11/08/2020   Right shoulder pain 06/27/2020   Paronychia of right middle finger 06/27/2020   S/P carotid endarterectomy, right 04/11/2020   Serrated adenoma of colon 03/12/2020   Aortic atherosclerosis ([place]) 02/03/2020   Stenosis of right carotid artery 01/26/2020   Branch retinal artery occlusion, right eye 01/19/2020   Posterior vitreous detachment of both eyes 01/19/2020   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) 08/31/2019   Tear of MCL (medial collateral ligament) of knee, left, initial encounter  08/05/2017   Gluteal tendinitis of left buttock 08/05/2017   Left inguinal hernia 07/04/2017   Allergic rhinitis 12/28/2015   H/O: rheumatic fever 12/28/2015   Prediabetes 12/27/2015   Rising PSA level 02/21/2014   Personal history of colonic polyps 02/17/2014   BPH with obstruction/lower urinary tract symptoms 05/04/2010   Hyperlipidemia 01/07/2009   Asthma 06/04/2007   Essential hypertension 09/26/2006    Immunization History  Administered Date(s) Administered   Fluad Quad(high Dose 65+) 11/21/2018, 02/03/2020   Influenza Whole 11/26/2007   Influenza-Unspecified 12/08/2015   PFIZER(Purple Top)SARS-COV-2 Vaccination 05/14/2019, 06/04/2019   Pneumococcal Conjugate-13 07/04/2017   Pneumococcal Polysaccharide-23 12/05/2018   Td 05/04/2010    Conditions to be addressed/monitored:  {USCCMDZASSESSMENTOPTIONS:23563}  There are no care plans that you recently modified to display for this patient.     Medication Assistance: {MEDASSISTANCEINFO:25044}  Compliance/Adherence/Medication fill history: Care Gaps: ***  Patient's preferred pharmacy is:  [facility], [address] [place] [address] Phone: [phone] Fax: [phone]   Uses pill box? {Yes or If no, why not?:20788} Pt endorses ***% compliance  Care Plan and Follow Up Patient Decision:  {FOLLOWUP:24991}  Plan: {CM FOLLOW UP PLAN:25073}  ***  Current Barriers:  Chronic Disease Management support, education, and care coordination needs related to Hypertension, Hyperlipidemia, and Carotid artery stenosis, Prediabetes   Hypertension Controlled  BP Readings from Last 3 Encounters:  03/10/21 118/72  02/06/21 120/70  01/23/21 (!) 88/52  Pharmacist Clinical Goal(s): Patient will work with PharmD and providers to maintain BP goal <130/80 Current regimen:  Telmisartan-HCTZ 80-12.5 mg daily Interventions: Discussed BP goals and benefits of medications  for prevention of heart attack / stroke Discussed importance of hydration to prevent low BP Patient self care activities p-atient will: Check BP 3-5 times weekly, document, and provide at future appointments Ensure daily salt intake < 2300 mg/day  Hyperlipidemia / Carotid artery stenosis Controlled  Lab Results  Component Value Date   LDLCALC 73 02/06/2021  Pharmacist Clinical Goal(s): Patient will work with PharmD and providers to achieve LDL goal < 70 Current regimen:  Atorvastatin 40 mg daily Aspirin 81 mg daily Interventions: Discussed cholesterol goals and benefits of medications for prevention of heart attack / stroke Patient self care activities - Patient will: Continue current medications  Prediabetes Controlled  Lab Results  Component Value Date   HGBA1C 6.0 02/06/2021  Pharmacist Clinical Goal(s): Patient will work with PharmD and providers to maintain A1c goal <6.5% Current regimen:  No medications Interventions: Discussed importance of preventing progression to diabetes to prevent complications of diabetes including kidney damage, retinal damage, and cardiovascular disease Patient self care activities - Patient will: Reduce carbohydrates in diet  Medication management Pharmacist Clinical Goal(s): Patient will work with PharmD and providers to maintain optimal medication adherence [facility] [place] Interventions Comprehensive medication review performed. Continue current medication management strategy Patient self care activities - Patient will: Focus on medication adherence by fill date Take medications as prescribed Report any questions or concerns to PharmD and/or provider(s)

## 2021-03-17 NOTE — Telephone Encounter (Signed)
Pt was returning a call for his 1:45 appt with Linna Hoff.   CB [phone]

## 2021-03-21 ENCOUNTER — Ambulatory Visit: Payer: Medicare HMO | Admitting: Family Medicine

## 2021-05-30 ENCOUNTER — Ambulatory Visit (INDEPENDENT_AMBULATORY_CARE_PROVIDER_SITE_OTHER): Payer: Medicare HMO | Admitting: Registered Nurse

## 2021-05-30 ENCOUNTER — Other Ambulatory Visit: Payer: Self-pay

## 2021-05-30 ENCOUNTER — Encounter: Payer: Self-pay | Admitting: Registered Nurse

## 2021-05-30 VITALS — BP 118/60 | HR 90 | Temp 98.3°F | Resp 18 | Ht 75.0 in | Wt 197.0 lb

## 2021-05-30 DIAGNOSIS — J22 Unspecified acute lower respiratory infection: Secondary | ICD-10-CM

## 2021-05-30 DIAGNOSIS — R0981 Nasal congestion: Secondary | ICD-10-CM

## 2021-05-30 MED ORDER — DOXYCYCLINE HYCLATE 100 MG PO TABS: 100.0000 mg | ORAL_TABLET | Freq: Two times a day (BID) | ORAL | 0 refills | Status: DC

## 2021-05-30 MED ORDER — PREDNISONE 20 MG PO TABS: 20.0000 mg | ORAL_TABLET | Freq: Every day | ORAL | 0 refills | Status: DC

## 2021-05-30 MED ORDER — AZELASTINE HCL 0.1 % NA SOLN: 1.0000 | Freq: Two times a day (BID) | NASAL | 12 refills | Status: DC

## 2021-05-30 NOTE — Progress Notes (Signed)
? ?Acute Office Visit ? ?Subjective:  ? ? Patient ID: Charles Tate, male    DOB: 1951-02-03, 71 y.o.   MRN: [id number] ? ?Chief Complaint  ?Patient presents with  ? Nasal Congestion  ?  Patient states since Saturday he has been experiencing some cough , runny nose, congestion, chest pain from cough and cold. He has been coughing up lumps of mucus.  ? ? ?HPI ?Patient is in today for nasal congestion ? ?Ongoing around 1 week ?Has been healthy throughout COVID ?Hx of seasonal allergies, environmental allergies. ?Saturday morning some aches, rhinorrhea, pnd. ?Took zyrtec, helped somewhat, but persisted, worsened. ?Coughing and sneezing started yesterday ?Coracedin has helped somewhat.  ?Woke up today with aches and pains, cough coming from chest.  ? ? ?Outpatient Medications Prior to Visit  ?Medication Sig Dispense Refill  ? aspirin EC 81 MG tablet Take 81 mg by mouth daily. Swallow whole.    ? atorvastatin (LIPITOR) 40 MG tablet TAKE 1 TABLET BY MOUTH EVERY DAY 90 tablet 3  ? Colostrum 500 MG CAPS Take 500 mg by mouth daily.    ? fluticasone (FLONASE) 50 MCG/ACT nasal spray SPRAY 2 SPRAYS INTO EACH NOSTRIL EVERY DAY (Patient taking differently: Place 1 spray into both nostrils daily as needed for allergies.) 16 mL 6  ? Misc Natural Products (SUPER GREENS PO) Take 1 Scoop by mouth daily.     ? telmisartan-hydrochlorothiazide (MICARDIS HCT) 80-12.5 MG tablet Take 1 tablet by mouth daily. 90 tablet 3  ? ?No facility-administered medications prior to visit.  ? ? ?Review of Systems ?Per hpi  ? ?   ?Objective:  ?  ?BP 118/60   Pulse 90   Temp 98.3 ?F (36.8 ?C) (Temporal)   Resp 18   Ht '6\' 3"'$  (1.905 m)   Wt 197 lb (89.4 kg)   SpO2 99%   BMI 24.62 kg/m?  ?Physical Exam ?Constitutional:   ?   General: He is not in acute distress. ?   Appearance: Normal appearance. He is normal weight. He is not ill-appearing, toxic-appearing or diaphoretic.  ?Cardiovascular:  ?   Rate and Rhythm: Normal rate and regular rhythm.  ?    Heart sounds: Normal heart sounds. No murmur heard. ?  No friction rub. No gallop.  ?Pulmonary:  ?   Effort: Pulmonary effort is normal. No respiratory distress.  ?   Breath sounds: No stridor. Wheezing (bibasalar) present. No rhonchi or rales.  ?Chest:  ?   Chest wall: No tenderness.  ?Neurological:  ?   General: No focal deficit present.  ?   Mental Status: He is alert and oriented to person, place, and time. Mental status is at baseline.  ?Psychiatric:     ?   Mood and Affect: Mood normal.     ?   Behavior: Behavior normal.     ?   Thought Content: Thought content normal.     ?   Judgment: Judgment normal.  ? ? ?No results found for any visits on 05/30/21. ? ? ?   ?Assessment & Plan:  ?1. Lower respiratory infection ?- doxycycline (VIBRA-TABS) 100 MG tablet; Take 1 tablet (100 mg total) by mouth 2 (two) times daily.  Dispense: 20 tablet; Refill: 0 ?- predniSONE (DELTASONE) 20 MG tablet; Take 1 tablet (20 mg total) by mouth daily with breakfast.  Dispense: 5 tablet; Refill: 0 ? ?2. Nasal congestion ?- azelastine (ASTELIN) 0.1 % nasal spray; Place 1 spray into both nostrils 2 (two) times daily. Use  in each nostril as directed  Dispense: 30 mL; Refill: 12 ? ? ? ?Meds ordered this encounter  ?Medications  ? doxycycline (VIBRA-TABS) 100 MG tablet  ?  Sig: Take 1 tablet (100 mg total) by mouth 2 (two) times daily.  ?  Dispense:  20 tablet  ?  Refill:  0  ?  Order Specific Question:   Supervising Provider  ?  Answer:   Carlota Raspberry, JEFFREY R [[id number]]  ? predniSONE (DELTASONE) 20 MG tablet  ?  Sig: Take 1 tablet (20 mg total) by mouth daily with breakfast.  ?  Dispense:  5 tablet  ?  Refill:  0  ?  Order Specific Question:   Supervising Provider  ?  Answer:   Carlota Raspberry, JEFFREY R [[id number]]  ? azelastine (ASTELIN) 0.1 % nasal spray  ?  Sig: Place 1 spray into both nostrils 2 (two) times daily. Use in each nostril as directed  ?  Dispense:  30 mL  ?  Refill:  12  ?  Order Specific Question:   Supervising Provider  ?  Answer:   Carlota Raspberry,  JEFFREY R [[id number]]  ? ? ?Return if symptoms worsen or fail to improve. ? ?PLAN ?Lower lungs with wheezing. Concern for developing pna. Will give doxycycline and prednisone burst ?Reviewed risks, benefits, and side effects, pt voices understanding. ?Return if worsening or failing to improve ?Continue coracedin ?Maximiano Coss, NP ?

## 2021-05-30 NOTE — Patient Instructions (Addendum)
Mr. Charles Tate -  ? ?Great to see you ? ?Doxycycline as prescribed. Finish entire course. Call if not any better by Thursday afternoon ? ?Thank you, ? ?Charles Tate  ? ? ? ?If you have lab work done today you will be contacted with your lab results within the next 2 weeks.  If you have not heard from [place] then please contact us. The fastest way to get your results is to register for My Chart. ? ? ?IF you received an x-ray today, you will receive an invoice from [facility]. Please contact [facility] at [phone] with questions or concerns regarding your invoice.  ? ?IF you received labwork today, you will receive an invoice from [facility]. Please contact [facility] at [phone] with questions or concerns regarding your invoice.  ? ?Our billing staff will not be able to assist you with questions regarding bills from these companies. ? ?You will be contacted with the lab results as soon as they are available. The fastest way to get your results is to activate your My Chart account. Instructions are located on the last page of this paperwork. If you have not heard from [place] regarding the results in 2 weeks, please contact this office. ?  ? ? ?

## 2021-06-10 IMAGING — CT CT ANGIO HEAD
1 of 11 series · 5 of 33 positions shown · IV contrast (omnipaque)
Comparison: None.

CLINICAL DATA: Carotid stenosis.

EXAM:
CT ANGIOGRAPHY HEAD AND NECK
TECHNIQUE: Multidetector CT imaging of the head and neck was performed using
the standard protocol during bolus administration of intravenous
contrast. Multiplanar CT image reconstructions and MIPs were
obtained to evaluate the vascular anatomy. Carotid stenosis
measurements (when applicable) are obtained utilizing NASCET
criteria, using the distal internal carotid diameter as the
denominator.
CONTRAST:  75mL OMNIPAQUE IOHEXOL 350 MG/ML SOLN

[Series 8: cta neck axial · axial · 0.39mm/px · z∈[-237,+19]mm · 5 of 387 slices shown]
[im 65/387  soft-tissue]
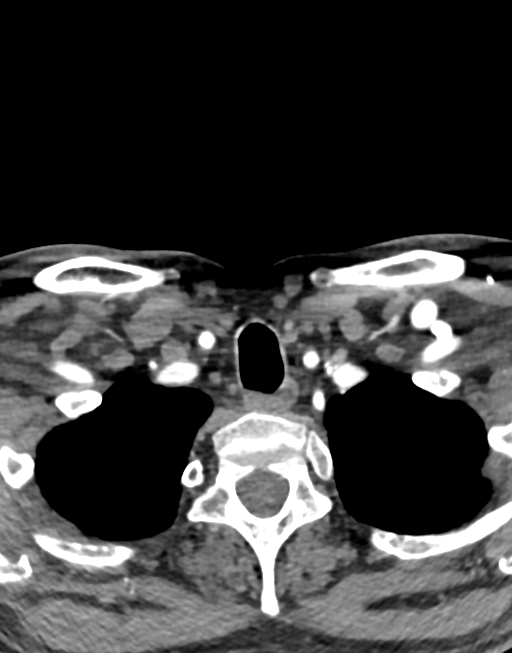
[im 129/387  bone]
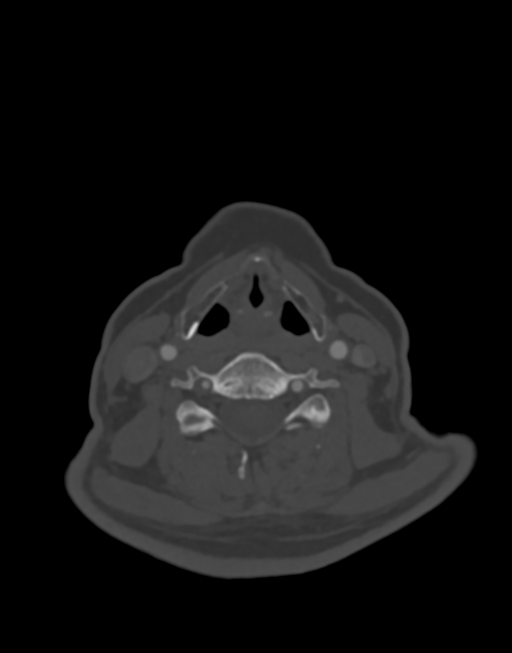
[im 194/387  soft-tissue]
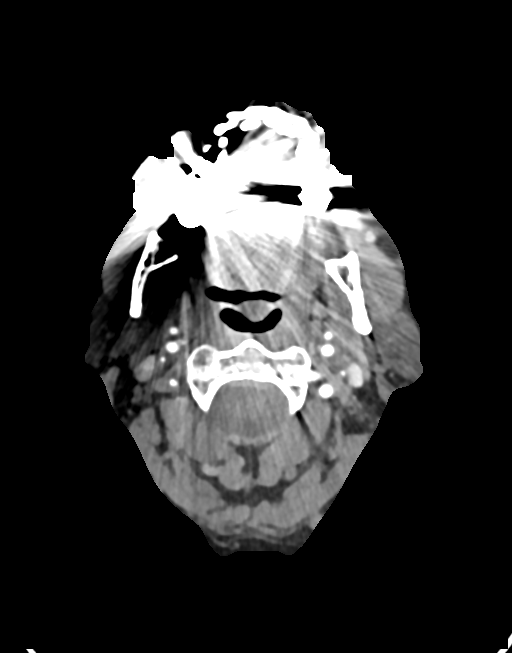
[im 258/387  bone]
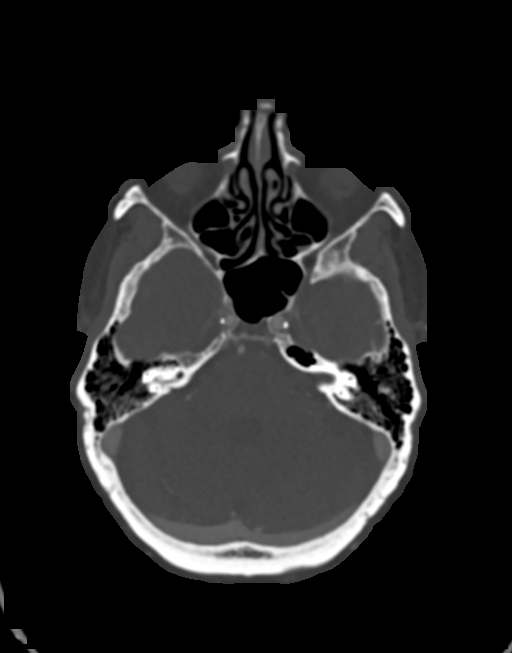
[im 322/387  soft-tissue]
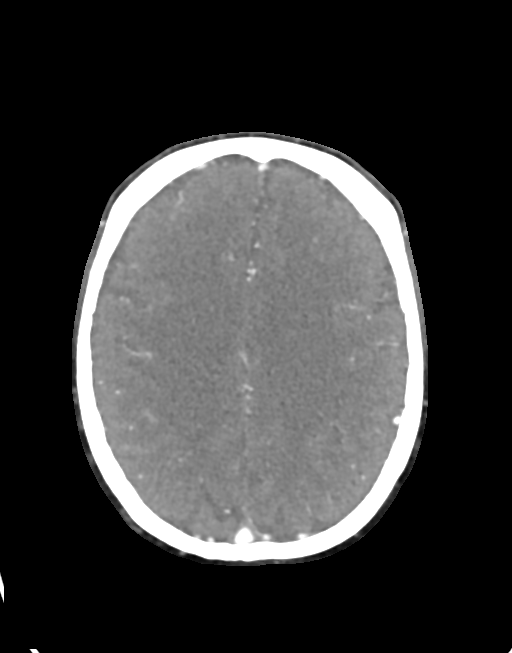

[5 of 33 positions shown; findings below may reference images not displayed]

FINDINGS: CT HEAD FINDINGS

Brain: There is no evidence of an acute infarct, intracranial
hemorrhage, mass, midline shift, or extra-axial fluid collection.
The ventricles and sulci are within normal limits for age.

Vascular: Calcified atherosclerosis at the skull base.

Skull: No fracture or suspicious osseous lesion.

Sinuses: Visualized paranasal sinuses and mastoid air cells are
clear.

Orbits: Unremarkable.

Review of the MIP images confirms the above findings

CTA NECK FINDINGS

Aortic arch: Normal variant aortic arch branching pattern with
common origin of the brachiocephalic and left common carotid
arteries. Mild arch atherosclerosis without arch vessel origin
stenosis.

Right carotid system: Patent with moderate volume soft plaque at the
carotid bifurcation resulting in 65% stenosis of the ICA origin. 4
mm focus of plaque ulceration at the posterior aspect of the carotid
bifurcation.

Left carotid system: Patent with mild soft and calcified plaque at
the carotid bifurcation without associated plaque ulceration or
stenosis.

Vertebral arteries: Patent without evidence of significant stenosis
or dissection. Strongly dominant left vertebral artery.

Skeleton: Mild cervical spondylosis.

Other neck: No evidence of cervical lymphadenopathy or mass.

Upper chest: Clear lung apices.

Review of the MIP images confirms the above findings

CTA HEAD FINDINGS

Anterior circulation: The internal carotid arteries are patent from
skull base to carotid termini with mild atherosclerotic plaque
bilaterally not resulting in significant stenosis. ACAs and MCAs are
patent with mild branch vessel irregularity but no evidence of a
flow limiting proximal stenosis or proximal branch occlusion. No
aneurysm is identified.

Posterior circulation: The intracranial vertebral arteries are
patent to the basilar with mild stenosis bilaterally. Patent PICA,
AICA, and SCA origins are seen bilaterally. The basilar artery is
widely patent. Posterior communicating arteries are diminutive or
absent. The PCAs are patent without evidence of a significant
proximal stenosis. No aneurysm is identified.

Venous sinuses: Patent.

Anatomic variants: None.

Review of the MIP images confirms the above findings
IMPRESSION: 1. Soft plaque at the right common carotid bifurcation with 65%
right ICA origin stenosis and small focus of plaque ulceration.
2. No significant left-sided carotid artery stenosis.
3. Mild bilateral intracranial vertebral artery stenosis.
4. Unremarkable CT appearance of the brain for age.
5. Aortic Atherosclerosis (522N6-WVS.S).

## 2021-08-08 DIAGNOSIS — H524 Presbyopia: Secondary | ICD-10-CM | POA: Diagnosis not present

## 2021-08-10 ENCOUNTER — Ambulatory Visit: Payer: Medicare HMO | Admitting: Internal Medicine

## 2021-08-10 ENCOUNTER — Encounter: Payer: Self-pay | Admitting: Internal Medicine

## 2021-08-10 NOTE — Patient Instructions (Addendum)
     Blood work was ordered.     Medications changes include :   None      Return in about 6 months (around 02/10/2022) for Physical Exam.

## 2021-08-10 NOTE — Progress Notes (Signed)
Subjective:    Patient ID: Charles Tate, male    DOB: 1950-10-04, 71 y.o.   MRN: [id number]     HPI Hermon is here for follow up of his chronic medical problems, including htn, hld, prediabetes    Medications and allergies reviewed with patient and updated if appropriate.  Current Outpatient Medications on File Prior to Visit  Medication Sig Dispense Refill   aspirin EC 81 MG tablet Take 81 mg by mouth daily. Swallow whole.     atorvastatin (LIPITOR) 40 MG tablet TAKE 1 TABLET BY MOUTH EVERY DAY 90 tablet 3   azelastine (ASTELIN) 0.1 % nasal spray Place 1 spray into both nostrils 2 (two) times daily. Use in each nostril as directed 30 mL 12   Colostrum 500 MG CAPS Take 500 mg by mouth daily.     fluticasone (FLONASE) 50 MCG/ACT nasal spray SPRAY 2 SPRAYS INTO EACH NOSTRIL EVERY DAY (Patient taking differently: Place 1 spray into both nostrils daily as needed for allergies.) 16 mL 6   Misc Natural Products (SUPER GREENS PO) Take 1 Scoop by mouth daily.      telmisartan-hydrochlorothiazide (MICARDIS HCT) 80-12.5 MG tablet Take 1 tablet by mouth daily. 90 tablet 3   No current facility-administered medications on file prior to visit.     Review of Systems  Constitutional:  Negative for fatigue and fever.  Respiratory:  Negative for cough, shortness of breath and wheezing.   Cardiovascular:  Negative for chest pain, palpitations and leg swelling.  Neurological:  Negative for dizziness, light-headedness and headaches.       Objective:   Vitals:   08/11/21 0915  BP: 112/80  Pulse: (!) 50  Temp: 98 F (36.7 C)  SpO2: 99%   BP Readings from Last 3 Encounters:  08/11/21 112/80  05/30/21 118/60  03/10/21 118/72   Wt Readings from Last 3 Encounters:  08/11/21 199 lb (90.3 kg)  05/30/21 197 lb (89.4 kg)  03/10/21 202 lb 9.6 oz (91.9 kg)   Body mass index is 24.87 kg/m.    Physical Exam Constitutional:      General: He is not in acute distress.     Appearance: Normal appearance. He is not ill-appearing.  HENT:     Head: Normocephalic and atraumatic.  Eyes:     Conjunctiva/sclera: Conjunctivae normal.  Cardiovascular:     Rate and Rhythm: Normal rate and regular rhythm.     Heart sounds: Normal heart sounds. No murmur heard. Pulmonary:     Effort: Pulmonary effort is normal. No respiratory distress.     Breath sounds: Normal breath sounds. No wheezing or rales.  Musculoskeletal:     Right lower leg: No edema.     Left lower leg: No edema.  Skin:    General: Skin is warm and dry.     Findings: No rash.  Neurological:     Mental Status: He is alert. Mental status is at baseline.  Psychiatric:        Mood and Affect: Mood normal.        Lab Results  Component Value Date   WBC 5.1 02/06/2021   HGB 14.4 02/06/2021   HCT 43.1 02/06/2021   PLT 194.0 02/06/2021   GLUCOSE 94 02/06/2021   CHOL 130 02/06/2021   TRIG 99.0 02/06/2021   HDL 36.80 (L) 02/06/2021   LDLDIRECT 161.0 12/05/2018   LDLCALC 73 02/06/2021   ALT 18 02/06/2021   AST 16 02/06/2021   NA  138 02/06/2021   K 4.2 02/06/2021   CL 101 02/06/2021   CREATININE 0.76 02/06/2021   BUN 14 02/06/2021   CO2 30 02/06/2021   TSH 1.46 02/06/2021   PSA 3.68 02/06/2021   INR 1.1 03/15/2020   HGBA1C 6.0 02/06/2021     Assessment & Plan:    See Problem List for Assessment and Plan of chronic medical problems.

## 2021-08-11 ENCOUNTER — Ambulatory Visit (INDEPENDENT_AMBULATORY_CARE_PROVIDER_SITE_OTHER): Payer: Medicare HMO | Admitting: Internal Medicine

## 2021-08-11 VITALS — BP 112/80 | HR 50 | Temp 98.0°F | Ht 75.0 in | Wt 199.0 lb

## 2021-08-11 DIAGNOSIS — I700 Atherosclerosis of aorta: Secondary | ICD-10-CM | POA: Diagnosis not present

## 2021-08-11 DIAGNOSIS — I10 Essential (primary) hypertension: Secondary | ICD-10-CM

## 2021-08-11 DIAGNOSIS — E782 Mixed hyperlipidemia: Secondary | ICD-10-CM | POA: Diagnosis not present

## 2021-08-11 DIAGNOSIS — R7303 Prediabetes: Secondary | ICD-10-CM

## 2021-08-11 LAB — LIPID PANEL
Cholesterol: 135 mg/dL (ref 0–200)
HDL: 39.1 mg/dL (ref >39.00)
LDL Cholesterol: 80 mg/dL (ref 0–99)
NonHDL: 96.21
Total CHOL/HDL Ratio: 3
Triglycerides: 79 mg/dL (ref 0.0–149.0)
VLDL: 15.8 mg/dL (ref 0.0–40.0)

## 2021-08-11 LAB — COMPREHENSIVE METABOLIC PANEL
ALT: 22 U/L (ref 0–53)
AST: 20 U/L (ref 0–37)
Albumin: 4.2 g/dL (ref 3.5–5.2)
Alkaline Phosphatase: 76 U/L (ref 39–117)
BUN: 17 mg/dL (ref 6–23)
CO2: 31 mEq/L (ref 19–32)
Calcium: 9.3 mg/dL (ref 8.4–10.5)
Chloride: 102 mEq/L (ref 96–112)
Creatinine, Ser: 0.77 mg/dL (ref 0.40–1.50)
GFR: 89.69 mL/min (ref >60.00)
Glucose, Bld: 96 mg/dL (ref 70–99)
Potassium: 4.1 mEq/L (ref 3.5–5.1)
Sodium: 139 mEq/L (ref 135–145)
Total Bilirubin: 0.5 mg/dL (ref 0.2–1.2)
Total Protein: 7.1 g/dL (ref 6.0–8.3)

## 2021-08-11 LAB — HEMOGLOBIN A1C: Hgb A1c MFr Bld: 5.8 % (ref 4.6–6.5)

## 2021-08-11 NOTE — Assessment & Plan Note (Addendum)
Chronic Continue atorvastatin 40 mg daily Encouraged healthy diet and regular exercise He is active but no other exercise

## 2021-08-11 NOTE — Assessment & Plan Note (Signed)
Chronic Blood pressure well controlled CMP Continue Micardis HCT 80-12.5 mg daily

## 2021-08-11 NOTE — Assessment & Plan Note (Signed)
Chronic Check a1c Low sugar / carb diet Stressed regular exercise  

## 2021-08-11 NOTE — Assessment & Plan Note (Addendum)
Chronic Regular exercise and healthy diet encouraged Check lipid panel Lipids controlled Continue atorvastatin 40 mg daily Will work on improving diet

## 2021-09-07 ENCOUNTER — Ambulatory Visit (INDEPENDENT_AMBULATORY_CARE_PROVIDER_SITE_OTHER): Payer: Medicare HMO

## 2021-09-07 VITALS — Ht 75.0 in | Wt 193.0 lb

## 2021-09-07 DIAGNOSIS — Z0000 Encounter for general adult medical examination without abnormal findings: Secondary | ICD-10-CM | POA: Diagnosis not present

## 2021-09-07 NOTE — Patient Instructions (Signed)
Charles Tate , Thank you for taking time to come for your Medicare Wellness Visit. I appreciate your ongoing commitment to your health goals. Please review the following plan we discussed and let me know if I can assist you in the future.   Screening recommendations/referrals: Colonoscopy: 05/30/2020; due every 5 years Recommended yearly ophthalmology/optometry visit for glaucoma screening and checkup Recommended yearly dental visit for hygiene and checkup  Vaccinations: Influenza vaccine: due every Fall season Pneumococcal vaccine: 07/04/2017, 12/05/2018 Tdap vaccine: 05/04/2010; due every 10 years; not covered by Medicare as preventative but will cover as treatment for an injury Shingles vaccine: never done; recommend Shingrix which is 2 doses 2-6 months apart and over 90% effective    Covid-19: 05/14/2019, 06/04/2019  Advanced directives: No  Conditions/risks identified: Yes; Client understands the importance of follow-up appointments with providers by attending scheduled visits and discussed goals to eat healthier, increase physical activity 5 times a week for 30 minutes each, exercise the brain by doing stimulating brain exercises (reading, adult coloring, crafting, listening to music, puzzles, etc.), socialize and enjoy life more, get enough sleep at least 8-9 hours average per night and make time for laughter.  Next appointment: Please schedule your next Medicare Wellness Visit with your Nurse Health Advisor in 1 year by calling [phone].  Preventive Care 72 Years and Older, Male Preventive care refers to lifestyle choices and visits with your health care provider that can promote health and wellness. What does preventive care include? A yearly physical exam. This is also called an annual well check. Dental exams once or twice a year. Routine eye exams. Ask your health care provider how often you should have your eyes checked. Personal lifestyle choices, including: Daily care of your  teeth and gums. Regular physical activity. Eating a healthy diet. Avoiding tobacco and drug use. Limiting alcohol use. Practicing safe sex. Taking low doses of aspirin every day. Taking vitamin and mineral supplements as recommended by your health care provider. What happens during an annual well check? The services and screenings done by your health care provider during your annual well check will depend on your age, overall health, lifestyle risk factors, and family history of disease. Counseling  Your health care provider may ask you questions about your: Alcohol use. Tobacco use. Drug use. Emotional well-being. Home and relationship well-being. Sexual activity. Eating habits. History of falls. Memory and ability to understand (cognition). Work and work Statistician. Screening  You may have the following tests or measurements: Height, weight, and BMI. Blood pressure. Lipid and cholesterol levels. These may be checked every 5 years, or more frequently if you are over 57 years old. Skin check. Lung cancer screening. You may have this screening every year starting at age 69 if you have a 30-pack-year history of smoking and currently smoke or have quit within the past 15 years. Fecal occult blood test (FOBT) of the stool. You may have this test every year starting at age 5. Flexible sigmoidoscopy or colonoscopy. You may have a sigmoidoscopy every 5 years or a colonoscopy every 10 years starting at age 64. Prostate cancer screening. Recommendations will vary depending on your family history and other risks. Hepatitis C blood test. Hepatitis B blood test. Sexually transmitted disease (STD) testing. Diabetes screening. This is done by checking your blood sugar (glucose) after you have not eaten for a while (fasting). You may have this done every 1-3 years. Abdominal aortic aneurysm (AAA) screening. You may need this if you are a current or former  smoker. Osteoporosis. You may be  screened starting at age 52 if you are at high risk. Talk with your health care provider about your test results, treatment options, and if necessary, the need for more tests. Vaccines  Your health care provider may recommend certain vaccines, such as: Influenza vaccine. This is recommended every year. Tetanus, diphtheria, and acellular pertussis (Tdap, Td) vaccine. You may need a Td booster every 10 years. Zoster vaccine. You may need this after age 46. Pneumococcal 13-valent conjugate (PCV13) vaccine. One dose is recommended after age 21. Pneumococcal polysaccharide (PPSV23) vaccine. One dose is recommended after age 52. Talk to your health care provider about which screenings and vaccines you need and how often you need them. This information is not intended to replace advice given to you by your health care provider. Make sure you discuss any questions you have with your health care provider. Document Released: 03/18/2015 Document Revised: 11/09/2015 Document Reviewed: 12/21/2014 Elsevier Interactive Patient Education  2017 [employer] Prevention in the Home Falls can cause injuries. They can happen to people of all ages. There are many things you can do to make your home safe and to help prevent falls. What can I do on the outside of my home? Regularly fix the edges of walkways and driveways and fix any cracks. Remove anything that might make you trip as you walk through a door, such as a raised step or threshold. Trim any bushes or trees on the path to your home. Use bright outdoor lighting. Clear any walking paths of anything that might make someone trip, such as rocks or tools. Regularly check to see if handrails are loose or broken. Make sure that both sides of any steps have handrails. Any raised decks and porches should have guardrails on the edges. Have any leaves, snow, or ice cleared regularly. Use sand or salt on walking paths during winter. Clean up any spills in  your garage right away. This includes oil or grease spills. What can I do in the bathroom? Use night lights. Install grab bars by the toilet and in the tub and shower. Do not use towel bars as grab bars. Use non-skid mats or decals in the tub or shower. If you need to sit down in the shower, use a plastic, non-slip stool. Keep the floor dry. Clean up any water that spills on the floor as soon as it happens. Remove soap buildup in the tub or shower regularly. Attach bath mats securely with double-sided non-slip rug tape. Do not have throw rugs and other things on the floor that can make you trip. What can I do in the bedroom? Use night lights. Make sure that you have a light by your bed that is easy to reach. Do not use any sheets or blankets that are too big for your bed. They should not hang down onto the floor. Have a firm chair that has side arms. You can use this for support while you get dressed. Do not have throw rugs and other things on the floor that can make you trip. What can I do in the kitchen? Clean up any spills right away. Avoid walking on wet floors. Keep items that you use a lot in easy-to-reach places. If you need to reach something above you, use a strong step stool that has a grab bar. Keep electrical cords out of the way. Do not use floor polish or wax that makes floors slippery. If you must use wax, use non-skid  floor wax. Do not have throw rugs and other things on the floor that can make you trip. What can I do with my stairs? Do not leave any items on the stairs. Make sure that there are handrails on both sides of the stairs and use them. Fix handrails that are broken or loose. Make sure that handrails are as long as the stairways. Check any carpeting to make sure that it is firmly attached to the stairs. Fix any carpet that is loose or worn. Avoid having throw rugs at the top or bottom of the stairs. If you do have throw rugs, attach them to the floor with carpet  tape. Make sure that you have a light switch at the top of the stairs and the bottom of the stairs. If you do not have them, ask someone to add them for you. What else can I do to help prevent falls? Wear shoes that: Do not have high heels. Have rubber bottoms. Are comfortable and fit you well. Are closed at the toe. Do not wear sandals. If you use a stepladder: Make sure that it is fully opened. Do not climb a closed stepladder. Make sure that both sides of the stepladder are locked into place. Ask someone to hold it for you, if possible. Clearly mark and make sure that you can see: Any grab bars or handrails. First and last steps. Where the edge of each step is. Use tools that help you move around (mobility aids) if they are needed. These include: Canes. Walkers. Scooters. Crutches. Turn on the lights when you go into a dark area. Replace any light bulbs as soon as they burn out. Set up your furniture so you have a clear path. Avoid moving your furniture around. If any of your floors are uneven, fix them. If there are any pets around you, be aware of where they are. Review your medicines with your doctor. Some medicines can make you feel dizzy. This can increase your chance of falling. Ask your doctor what other things that you can do to help prevent falls. This information is not intended to replace advice given to you by your health care provider. Make sure you discuss any questions you have with your health care provider. Document Released: 12/16/2008 Document Revised: 07/28/2015 Document Reviewed: 03/26/2014 Elsevier Interactive Patient Education  2017 [employer].

## 2021-09-07 NOTE — Progress Notes (Signed)
I connected with Charles Tate today by telephone and verified that I am speaking with the correct person using two identifiers. Location patient: home Location provider: work Persons participating in the virtual visit: patient, provider.   I discussed the limitations, risks, security and privacy concerns of performing an evaluation and management service by telephone and the availability of in person appointments. I also discussed with the patient that there may be a patient responsible charge related to this service. The patient expressed understanding and verbally consented to this telephonic visit.    Interactive audio and video telecommunications were attempted between this provider and patient, however failed, due to patient having technical difficulties OR patient did not have access to video capability.  We continued and completed visit with audio only.  Some vital signs may be absent or patient reported.   Time Spent with patient on telephone encounter: 30 minutes  Subjective:   Charles Tate is a 71 y.o. male who presents for Medicare Annual/Subsequent preventive examination.  Review of Systems     Cardiac Risk Factors include: advanced age (>1mn, >>64women);dyslipidemia;family history of premature cardiovascular disease;hypertension;male gender     Objective:    Today's Vitals   09/07/21 0900  Weight: 193 lb (87.5 kg)  Height: '6\' 3"'$  (1.905 m)   Body mass index is 24.12 kg/m.     09/07/2021    8:48 AM 05/30/2020    6:38 AM 04/20/2020   10:33 PM 04/11/2020    2:11 PM 04/04/2020    3:01 PM 03/15/2020   11:08 AM 07/01/2019    6:41 AM  Advanced Directives  Does Patient Have a Medical Advance Directive? No No No No No No No  Would patient like information on creating a medical advance directive? No - Patient declined  No - Patient declined No - Patient declined No - Patient declined No - Patient declined No - Patient declined    Current Medications  (verified) Outpatient Encounter Medications as of 09/07/2021  Medication Sig   aspirin EC 81 MG tablet Take 81 mg by mouth daily. Swallow whole.   atorvastatin (LIPITOR) 40 MG tablet TAKE 1 TABLET BY MOUTH EVERY DAY   azelastine (ASTELIN) 0.1 % nasal spray Place 1 spray into both nostrils 2 (two) times daily. Use in each nostril as directed   Colostrum 500 MG CAPS Take 500 mg by mouth daily.   fluticasone (FLONASE) 50 MCG/ACT nasal spray SPRAY 2 SPRAYS INTO EACH NOSTRIL EVERY DAY (Patient taking differently: Place 1 spray into both nostrils daily as needed for allergies.)   Misc Natural Products (SUPER GREENS PO) Take 1 Scoop by mouth daily.    telmisartan-hydrochlorothiazide (MICARDIS HCT) 80-12.5 MG tablet Take 1 tablet by mouth daily.   No facility-administered encounter medications on file as of 09/07/2021.    Allergies (verified) Ciprofloxacin hcl, Cefdinir, and Lisinopril   History: Past Medical History:  Diagnosis Date   Allergy    Arthritis    left hip    GERD (gastroesophageal reflux disease)    per patient, resolved. 03/15/20   Heart murmur    adolescent rheumatic fever - developed murmur    Hyperlipidemia    Hypertension    RAD (reactive airway disease)    with RTIs   Tubular adenoma    Past Surgical History:  Procedure Laterality Date   COLONOSCOPY     colonoscopy with polypectomy  2013   Dr PFerdinand Lango [place]   COLONOSCOPY WITH PROPOFOL N/A 07/01/2019   Procedure: COLONOSCOPY WITH  PROPOFOL;  Surgeon: Irving Copas., MD;  Location: [facility];  Service: Gastroenterology;  Laterality: N/A;   COLONOSCOPY WITH PROPOFOL N/A 05/30/2020   Procedure: COLONOSCOPY WITH PROPOFOL;  Surgeon: Rush Landmark Telford Nab., MD;  Location: [facility];  Service: Gastroenterology;  Laterality: N/A;   ENDARTERECTOMY Right 04/11/2020   Procedure: Right Carotid Endarterectomy;  Surgeon: Elam Dutch, MD;  Location: [facility] OR;  Service: Vascular;  Laterality: Right;   ENDOSCOPIC  MUCOSAL RESECTION N/A 07/01/2019   Procedure: ENDOSCOPIC MUCOSAL RESECTION;  Surgeon: Rush Landmark Telford Nab., MD;  Location: [facility];  Service: Gastroenterology;  Laterality: N/A;   ENDOSCOPIC MUCOSAL RESECTION N/A 05/30/2020   Procedure: ENDOSCOPIC MUCOSAL RESECTION;  Surgeon: Rush Landmark Telford Nab., MD;  Location: [facility];  Service: Gastroenterology;  Laterality: N/A;   HEMOSTASIS CLIP PLACEMENT  07/01/2019   Procedure: HEMOSTASIS CLIP PLACEMENT;  Surgeon: Irving Copas., MD;  Location: [facility];  Service: Gastroenterology;;   POLYPECTOMY     POLYPECTOMY  07/01/2019   Procedure: POLYPECTOMY;  Surgeon: Irving Copas., MD;  Location: [facility];  Service: Gastroenterology;;   POLYPECTOMY  05/30/2020   Procedure: POLYPECTOMY;  Surgeon: Irving Copas., MD;  Location: [facility];  Service: Gastroenterology;;   SUBMUCOSAL LIFTING INJECTION  07/01/2019   Procedure: SUBMUCOSAL LIFTING INJECTION;  Surgeon: Irving Copas., MD;  Location: [facility];  Service: Gastroenterology;;   SUBMUCOSAL TATTOO INJECTION  07/01/2019   Procedure: SUBMUCOSAL TATTOO INJECTION;  Surgeon: Irving Copas., MD;  Location: [facility];  Service: Gastroenterology;;   WISDOM TOOTH EXTRACTION     Family History  Problem Relation Age of Onset   Arthritis Mother    Hyperlipidemia Mother    Lupus Mother    Heart attack Father 79   Hypertension Sister    Heart attack Paternal Grandmother 74   Diabetes Paternal Aunt    Stroke Maternal Grandfather 71   Cancer Paternal Grandfather        ? stomach; in 72s   Colon cancer Paternal Grandfather    Colon polyps Neg Hx    Esophageal cancer Neg Hx    Rectal cancer Neg Hx    Stomach cancer Neg Hx    Inflammatory bowel disease Neg Hx    Liver disease Neg Hx    Pancreatic cancer Neg Hx    Social History   Socioeconomic History   Marital status: Married    Spouse name: Charles Tate   Number of children: 2   Years of  education: Not on file   Highest education level: Not on file  Occupational History   Occupation: self employed   Tobacco Use   Smoking status: Former   Smokeless tobacco: Never   Tobacco comments:    intermittent, short term smoker as a teen. Some second hand smoke in 33s & 30s  Vaping Use   Vaping Use: Never used  Substance and Sexual Activity   Alcohol use: Yes    Comment:   very rarely   Drug use: No   Sexual activity: Not on file  Other Topics Concern   Not on file  Social History Narrative   Exercise: none regularly   Social Determinants of Health   Financial Resource Strain: Low Risk  (09/07/2021)   Overall Financial Resource Strain (CARDIA)    Difficulty of Paying Living Expenses: Not hard at all  Food Insecurity: No Food Insecurity (09/07/2021)   Hunger Vital Sign    Worried About Running Out of Food in the Last Year: Never true  Ran Out of Food in the Last Year: Never true  Transportation Needs: No Transportation Needs (09/07/2021)   PRAPARE - Hydrologist (Medical): No    Lack of Transportation (Non-Medical): No  Physical Activity: Sufficiently Active (09/07/2021)   Exercise Vital Sign    Days of Exercise per Week: 5 days    Minutes of Exercise per Session: 30 min  Stress: No Stress Concern Present (09/07/2021)   [place]    Feeling of Stress : Not at all  Social Connections: [place] (09/07/2021)   Social Connection and Isolation Panel [NHANES]    Frequency of Communication with Friends and Family: More than three times a week    Frequency of Social Gatherings with Friends and Family: More than three times a week    Attends Religious Services: 1 to 4 times per year    Active Member of [employer] or Organizations: Yes    Attends Archivist Meetings: 1 to 4 times per year    Marital Status: Married    Tobacco Counseling Counseling given: Not  Answered Tobacco comments: intermittent, short term smoker as a teen. Some second hand smoke in 46s & 30s   Clinical Intake:  Pre-visit preparation completed: Yes  Pain : No/denies pain     BMI - recorded: 24.87 Nutritional Status: BMI of 19-24  Normal Nutritional Risks: None Diabetes: No  How often do you need to have someone help you when you read instructions, pamphlets, or other written materials from your doctor or pharmacy?: 1 - Never What is the last grade level you completed in school?: HSG; Some college  Diabetic? no  Interpreter Needed?: No  Information entered by :: Lisette Abu, LPN.   Activities of Daily Living    09/07/2021    8:56 AM  In your present state of health, do you have any difficulty performing the following activities:  Hearing? 0  Vision? 0  Difficulty concentrating or making decisions? 0  Walking or climbing stairs? 0  Dressing or bathing? 0  Doing errands, shopping? 0  Preparing Food and eating ? N  Using the Toilet? N  In the past six months, have you accidently leaked urine? N  Do you have problems with loss of bowel control? N  Managing your Medications? N  Managing your Finances? N  Housekeeping or managing your Housekeeping? N    Patient Care Team: [name], MD as PCP - General (Internal Medicine) Delice Bison, Darnelle Maffucci, [facility] (Inactive) (Pharmacist) Amedeo Kinsman, OD as Consulting Physician (Optometry) Zadie Rhine, Clent Demark, MD as Consulting Physician (Ophthalmology)  Indicate any recent Medical Services you may have received from other than [facility] providers in the past year (date may be approximate).     Assessment:   This is a routine wellness examination for [place].  Hearing/Vision screen Hearing Screening - Comments:: Patient denied any hearing difficulty. No hearing aids. Vision Screening - Comments:: Patient wears corrective lenses. Eye exam done by: Dr. 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        Depression Screen    09/07/2021    8:52 AM 08/11/2021    9:20 AM 05/30/2021   12:01 PM 04/20/2020   10:33 PM 01/20/2020   11:50 AM 12/05/2018    1:31 PM 06/19/2017    2:08 PM  PHQ 2/9 Scores  PHQ - 2 Score 0 0 0 0 0 0 0  PHQ- 9 Score 0 0 0        Fall Risk    09/07/2021    8:49 AM 08/11/2021    9:20 AM 05/30/2021   12:01 PM 01/20/2020   11:49 AM 12/05/2018    1:30 PM  Fall Risk   Falls in the past year? 0 0 0 0 0  Number falls in past yr: 0 0 0 0 0  Injury with Fall? 0 0 0 0 0  Risk for fall due to : No Fall Risks No Fall Risks No Fall Risks No Fall Risks   Follow up Falls evaluation completed Falls evaluation completed Falls evaluation completed Falls evaluation completed     [place]:  Any stairs in or around the home? No  If so, are there any without handrails? No  Home free of loose throw rugs in walkways, pet beds, electrical cords, etc? Yes  Adequate lighting in your home to reduce risk of falls? Yes   ASSISTIVE DEVICES UTILIZED TO PREVENT FALLS:  Life alert? No  Use of a cane, walker or w/c? No  Grab bars in the bathroom? Yes  Shower chair or bench in shower? No  Elevated toilet seat or a handicapped toilet? No   TIMED UP AND GO:  Was the test performed? No .  Length of time to ambulate 10 feet: n/a sec.   Appearance of gait: Gait not evaluated during this visit.  Cognitive Function:        09/07/2021    9:04 AM  6CIT Screen  What Year? 0 points  What month? 0 points  What time? 0 points  Count back  from 20 0 points  Months in reverse 0 points  Repeat phrase 0 points  Total Score 0 points    Immunizations Immunization History  Administered Date(s) Administered   Fluad Quad(high Dose 65+) 11/21/2018, 02/03/2020   Influenza Whole 11/26/2007   Influenza-Unspecified 12/08/2015   PFIZER(Purple Top)SARS-COV-2 Vaccination 05/14/2019, 06/04/2019   Pneumococcal Conjugate-13 07/04/2017   Pneumococcal Polysaccharide-23 12/05/2018   Td 05/04/2010    TDAP status: Due, Education has been provided regarding the importance of this vaccine. Advised may receive this vaccine at local pharmacy or Health Dept. Aware to provide a copy of the vaccination record if obtained from local pharmacy or Health Dept. Verbalized acceptance and understanding.  Flu Vaccine status: Due, Education has been provided regarding the importance of this vaccine. Advised may receive this vaccine at local pharmacy or Health Dept. Aware to provide a copy of the vaccination record if obtained from local pharmacy or Health Dept. Verbalized acceptance and understanding.  Pneumococcal vaccine status: Up to date  Covid-19 vaccine status: Completed vaccines  Qualifies for Shingles Vaccine? Yes   Zostavax completed No   Shingrix Completed?: No.    Education has been provided regarding the importance of this vaccine. Patient has been advised to call insurance company to determine out of pocket expense if they have not yet received this vaccine. Advised may also receive vaccine at local pharmacy or Health Dept. Verbalized acceptance and understanding.  Screening Tests Health Maintenance  Topic Date Due   Zoster Vaccines- Shingrix (1 of 2) Never done   COVID-19 Vaccine (3 - Pfizer series) 07/30/2019   TETANUS/TDAP  02/06/2022 (Originally 05/03/2020)   INFLUENZA VACCINE  10/03/2021   COLONOSCOPY (Pts 45-18yr Insurance coverage will need to be confirmed)  05/30/2025   Pneumonia Vaccine 72 Years old  Completed   Hepatitis C  Screening  Completed   HPV VACCINES  Aged Out    Health Maintenance  Health Maintenance Due  Topic Date Due   Zoster Vaccines- Shingrix (1 of 2) Never done   COVID-19 Vaccine (3 - Pfizer series) 07/30/2019    Colorectal cancer screening: Type of screening: Colonoscopy. Completed 05/30/2020. Repeat every 5 years  Lung Cancer Screening: (Low Dose CT Chest recommended if Age 72-80years, 30 pack-year currently smoking OR have quit w/in 15years.) does not qualify.   Lung Cancer Screening Referral: no  Additional Screening:  Hepatitis C Screening: does qualify; Completed 12/28/2015  Vision Screening: Recommended annual ophthalmology exams for early detection of glaucoma and other disorders of the eye. Is the patient up to date with their annual eye exam?  Yes  Who is the provider or what is the name of the office in which the patient attends annual eye exams? JTeola Bradley OD. If pt is not established with a provider, would they like to be referred to a provider to establish care? No .   Dental Screening: Recommended annual dental exams for proper oral hygiene  Community Resource Referral / Chronic Care Management: CRR required this visit?  No   CCM required this visit?  No      Plan:     I have personally reviewed and noted the following in the patient's chart:   Medical and social history Use of alcohol, tobacco or illicit drugs  Current medications and supplements including opioid prescriptions. Patient is not currently taking opioid prescriptions. Functional ability and status Nutritional status Physical activity Advanced directives List of other physicians Hospitalizations, surgeries, and ER visits in previous 12 months Vitals Screenings to include cognitive, depression, and falls Referrals and appointments  In addition, I have reviewed and discussed with patient certain preventive protocols, quality metrics, and best practice recommendations. A written  personalized care plan for preventive services as well as general preventive health recommendations were provided to patient.     SSheral Flow LPN   [id number]  Nurse Notes:  Patient provided weight and height for this visit. There were no vitals filed for this visit. Patient stated that he has no issues with gait or balance; does not use any assistive devices.

## 2021-11-02 DIAGNOSIS — N401 Enlarged prostate with lower urinary tract symptoms: Secondary | ICD-10-CM | POA: Diagnosis not present

## 2021-11-02 DIAGNOSIS — R350 Frequency of micturition: Secondary | ICD-10-CM | POA: Diagnosis not present

## 2021-11-02 DIAGNOSIS — N486 Induration penis plastica: Secondary | ICD-10-CM | POA: Diagnosis not present

## 2021-11-13 ENCOUNTER — Telehealth: Payer: Self-pay | Admitting: Internal Medicine

## 2021-11-13 ENCOUNTER — Encounter: Payer: Self-pay | Admitting: Internal Medicine

## 2021-11-13 ENCOUNTER — Telehealth (INDEPENDENT_AMBULATORY_CARE_PROVIDER_SITE_OTHER): Payer: Medicare HMO | Admitting: Internal Medicine

## 2021-11-13 DIAGNOSIS — U071 COVID-19: Secondary | ICD-10-CM

## 2021-11-13 MED ORDER — MOLNUPIRAVIR EUA 200MG CAPSULE: 4.0000 | ORAL_CAPSULE | Freq: Two times a day (BID) | ORAL | 0 refills | Status: AC

## 2021-11-13 MED ORDER — HYDROCOD POLI-CHLORPHE POLI ER 10-8 MG/5ML PO SUER
5.0000 mL | Freq: Two times a day (BID) | ORAL | 0 refills | Status: AC | PRN
Start: 2021-11-13 — End: 2021-11-20

## 2021-11-13 NOTE — Telephone Encounter (Signed)
Patient states that he is suppose to be on a video call and that he missed a call from [facility]

## 2021-11-13 NOTE — Progress Notes (Signed)
Virtual Visit via Video Note  I connected with Charles Tate on 11/13/21 at  2:40 PM EDT by a video enabled telemedicine application and verified that I am speaking with the correct person using two identifiers.   I discussed the limitations of evaluation and management by telemedicine and the availability of in person appointments. The patient expressed understanding and agreed to proceed.  Present for the visit:  Myself, Dr Billey Gosling, [name].  The patient is currently at home and I am in the office.    No referring provider.    History of Present Illness: This is an acute visit for covid.   He has never had covid.  His family visited him over labor day and had covid, his wife got it last Thursday.  He started with symptoms two days ago  He was achy, PND, nasal congestion, head pressure,.  Sunday fever 101.5   This am fever 99.8.    Taking tylenol.    Review of Systems  Constitutional:  Positive for fever.       No change in appetite  HENT:  Positive for congestion and sinus pain.        PND, no change in taste  Respiratory:  Positive for cough and wheezing. Negative for shortness of breath.   Gastrointestinal:  Negative for diarrhea and nausea.  Musculoskeletal:  Positive for back pain (upper back and neck soreness).  Neurological:  Positive for headaches.       Mild lightheaded      Social History   Socioeconomic History  . Marital status: Married    Spouse name: [name]  . Number of children: 2  . Years of education: Not on file  . Highest education level: Not on file  Occupational History  . Occupation: self employed   Tobacco Use  . Smoking status: Former  . Smokeless tobacco: Never  . Tobacco comments:    intermittent, short term smoker as a teen. Some second hand smoke in 63s & 30s  Vaping Use  . Vaping Use: Never used  Substance and Sexual Activity  . Alcohol use: Yes    Comment:   very rarely  . Drug use: No  . Sexual activity: Not on  file  Other Topics Concern  . Not on file  Social History Narrative   Exercise: none regularly   Social Determinants of Health   Financial Resource Strain: Low Risk  (09/07/2021)   Overall Financial Resource Strain (CARDIA)   . Difficulty of Paying Living Expenses: Not hard at all  Food Insecurity: No Food Insecurity (09/07/2021)   Hunger Vital Sign   . Worried About Charity fundraiser in the Last Year: Never true   . Ran Out of Food in the Last Year: Never true  Transportation Needs: No Transportation Needs (09/07/2021)   PRAPARE - Transportation   . Lack of Transportation (Medical): No   . Lack of Transportation (Non-Medical): No  Physical Activity: Sufficiently Active (09/07/2021)   Exercise Vital Sign   . Days of Exercise per Week: 5 days   . Minutes of Exercise per Session: 30 min  Stress: No Stress Concern Present (09/07/2021)   [place]   . Feeling of Stress : Not at all  Social Connections: Socially Integrated (09/07/2021)   Social Connection and Isolation Panel [NHANES]   . Frequency of Communication with Friends and Family: More than three times a week   . Frequency of Social  Gatherings with Friends and Family: More than three times a week   . Attends Religious Services: 1 to 4 times per year   . Active Member of Clubs or Organizations: Yes   . Attends Archivist Meetings: 1 to 4 times per year   . Marital Status: Married     Observations/Objective: Appears well in NAD Breathing normally  Assessment and Plan:  See Problem List for Assessment and Plan of chronic medical problems.   Follow Up Instructions:    I discussed the assessment and treatment plan with the patient. The patient was provided an opportunity to ask questions and all were answered. The patient agreed with the plan and demonstrated an understanding of the instructions.   The patient was advised to call back or seek an  in-person evaluation if the symptoms worsen or if the condition fails to improve as anticipated.    [name], MD

## 2021-11-13 NOTE — Assessment & Plan Note (Addendum)
Acute Today is day 3 of symptoms Tested positive and symptoms mild - moderate in nature Discussed recommended quarantine Continue tylenol and otc cold meds for symptom relief Will start molnupiravir 800 mg bid x 5 days - discussed possible side effects tussionex cough syrup at night Rest, fluids Call with questions, concerns

## 2021-11-13 NOTE — Telephone Encounter (Signed)
Spoke with patient today. 

## 2021-12-12 DIAGNOSIS — H43311 Vitreous membranes and strands, right eye: Secondary | ICD-10-CM | POA: Diagnosis not present

## 2021-12-12 DIAGNOSIS — H34231 Retinal artery branch occlusion, right eye: Secondary | ICD-10-CM | POA: Diagnosis not present

## 2021-12-12 DIAGNOSIS — H2511 Age-related nuclear cataract, right eye: Secondary | ICD-10-CM | POA: Diagnosis not present

## 2021-12-15 DIAGNOSIS — N401 Enlarged prostate with lower urinary tract symptoms: Secondary | ICD-10-CM | POA: Diagnosis not present

## 2021-12-15 DIAGNOSIS — R350 Frequency of micturition: Secondary | ICD-10-CM | POA: Diagnosis not present

## 2021-12-15 DIAGNOSIS — R3912 Poor urinary stream: Secondary | ICD-10-CM | POA: Diagnosis not present

## 2022-01-14 ENCOUNTER — Other Ambulatory Visit: Payer: Self-pay | Admitting: Internal Medicine

## 2022-01-16 ENCOUNTER — Other Ambulatory Visit: Payer: Self-pay | Admitting: *Deleted

## 2022-01-16 DIAGNOSIS — I6521 Occlusion and stenosis of right carotid artery: Secondary | ICD-10-CM

## 2022-01-23 ENCOUNTER — Ambulatory Visit (HOSPITAL_COMMUNITY)
Admission: RE | Admit: 2022-01-23 | Discharge: 2022-01-23 | Disposition: A | Discharge status: Home / Self Care | Payer: Medicare HMO | Source: Ambulatory Visit | Attending: Vascular Surgery | Admitting: Vascular Surgery

## 2022-01-23 ENCOUNTER — Ambulatory Visit: Payer: Medicare HMO | Admitting: Physician Assistant

## 2022-01-23 VITALS — BP 92/60 | HR 65 | Temp 98.8°F | Ht 74.0 in | Wt 198.5 lb

## 2022-01-23 DIAGNOSIS — I6521 Occlusion and stenosis of right carotid artery: Secondary | ICD-10-CM | POA: Diagnosis not present

## 2022-01-23 DIAGNOSIS — I6523 Occlusion and stenosis of bilateral carotid arteries: Secondary | ICD-10-CM

## 2022-01-23 NOTE — Progress Notes (Signed)
HISTORY AND PHYSICAL     CC:  follow up. Requesting Provider:  [name], MD  HPI: This is a 71 y.o. male here for follow up for carotid artery stenosis.  Pt is s/p right CEA for symptomatic (right retinal embolus) carotid artery stenosis on 04/11/2020 by Dr. Oneida Alar.    Pt was last seen 01/23/2021 and at that time he was doing well.  He was having low blood pressures but no stroke sx.    Pt returns today for follow up.    Pt denies any amaurosis fugax, speech difficulties, weakness, numbness, paralysis or clumsiness or facial droop.  He states that the eye doctor was able to stick a needle in his eye and relieve the pressure and his vision was restored.  He continues to have some low blood pressures.     The pt is on a statin for cholesterol management.  The pt is on a daily aspirin.   Other AC:  none The pt is on ARB, HCTZ for hypertension.   The pt does not have diabetes Tobacco hx:  former   Past Medical History:  Diagnosis Date   Allergy    Arthritis    left hip    GERD (gastroesophageal reflux disease)    per patient, resolved. 03/15/20   Heart murmur    adolescent rheumatic fever - developed murmur    Hyperlipidemia    Hypertension    RAD (reactive airway disease)    with RTIs   Tubular adenoma     Past Surgical History:  Procedure Laterality Date   COLONOSCOPY     colonoscopy with polypectomy  2013   Dr Ferdinand Lango, [place]   COLONOSCOPY WITH PROPOFOL N/A 07/01/2019   Procedure: COLONOSCOPY WITH PROPOFOL;  Surgeon: Irving Copas., MD;  Location: [facility];  Service: Gastroenterology;  Laterality: N/A;   COLONOSCOPY WITH PROPOFOL N/A 05/30/2020   Procedure: COLONOSCOPY WITH PROPOFOL;  Surgeon: Rush Landmark Telford Nab., MD;  Location: [facility];  Service: Gastroenterology;  Laterality: N/A;   ENDARTERECTOMY Right 04/11/2020   Procedure: Right Carotid Endarterectomy;  Surgeon: Elam Dutch, MD;  Location: [facility];  Service: Vascular;  Laterality:  Right;   ENDOSCOPIC MUCOSAL RESECTION N/A 07/01/2019   Procedure: ENDOSCOPIC MUCOSAL RESECTION;  Surgeon: Rush Landmark Telford Nab., MD;  Location: [facility];  Service: Gastroenterology;  Laterality: N/A;   ENDOSCOPIC MUCOSAL RESECTION N/A 05/30/2020   Procedure: ENDOSCOPIC MUCOSAL RESECTION;  Surgeon: Rush Landmark Telford Nab., MD;  Location: [facility];  Service: Gastroenterology;  Laterality: N/A;   HEMOSTASIS CLIP PLACEMENT  07/01/2019   Procedure: HEMOSTASIS CLIP PLACEMENT;  Surgeon: Irving Copas., MD;  Location: [facility];  Service: Gastroenterology;;   POLYPECTOMY     POLYPECTOMY  07/01/2019   Procedure: POLYPECTOMY;  Surgeon: Irving Copas., MD;  Location: [facility];  Service: Gastroenterology;;   POLYPECTOMY  05/30/2020   Procedure: POLYPECTOMY;  Surgeon: Irving Copas., MD;  Location: [facility];  Service: Gastroenterology;;   SUBMUCOSAL LIFTING INJECTION  07/01/2019   Procedure: SUBMUCOSAL LIFTING INJECTION;  Surgeon: Irving Copas., MD;  Location: [facility];  Service: Gastroenterology;;   SUBMUCOSAL TATTOO INJECTION  07/01/2019   Procedure: SUBMUCOSAL TATTOO INJECTION;  Surgeon: Irving Copas., MD;  Location: [facility];  Service: Gastroenterology;;   WISDOM TOOTH EXTRACTION      Allergies  Allergen Reactions   Ciprofloxacin Hcl     Numbness, joint pain   Cefdinir Diarrhea    Fever, chills, stomach cramps    Lisinopril Cough  Current Outpatient Medications  Medication Sig Dispense Refill   aspirin EC 81 MG tablet Take 81 mg by mouth daily. Swallow whole.     atorvastatin (LIPITOR) 40 MG tablet TAKE 1 TABLET BY MOUTH EVERY DAY 90 tablet 3   azelastine (ASTELIN) 0.1 % nasal spray Place 1 spray into both nostrils 2 (two) times daily. Use in each nostril as directed 30 mL 12   Colostrum 500 MG CAPS Take 500 mg by mouth daily.     fluticasone (FLONASE) 50 MCG/ACT nasal spray SPRAY 2 SPRAYS INTO EACH NOSTRIL EVERY DAY  (Patient taking differently: Place 1 spray into both nostrils daily as needed for allergies.) 16 mL 6   Misc Natural Products (SUPER GREENS PO) Take 1 Scoop by mouth daily.      telmisartan-hydrochlorothiazide (MICARDIS HCT) 80-12.5 MG tablet TAKE 1 TABLET BY MOUTH EVERY DAY 90 tablet 3   No current facility-administered medications for this visit.    Family History  Problem Relation Age of Onset   Arthritis Mother    Hyperlipidemia Mother    Lupus Mother    Heart attack Father 66   Hypertension Sister    Heart attack Paternal Grandmother 50   Diabetes Paternal Aunt    Stroke Maternal Grandfather 61   Cancer Paternal Grandfather        ? stomach; in 83s   Colon cancer Paternal Grandfather    Colon polyps Neg Hx    Esophageal cancer Neg Hx    Rectal cancer Neg Hx    Stomach cancer Neg Hx    Inflammatory bowel disease Neg Hx    Liver disease Neg Hx    Pancreatic cancer Neg Hx     Social History   Socioeconomic History   Marital status: Married    Spouse name: [name]   Number of children: 2   Years of education: Not on file   Highest education level: Not on file  Occupational History   Occupation: self employed   Tobacco Use   Smoking status: Former   Smokeless tobacco: Never   Tobacco comments:    intermittent, short term smoker as a teen. Some second hand smoke in 29s & 30s  Vaping Use   Vaping Use: Never used  Substance and Sexual Activity   Alcohol use: Yes    Comment:   very rarely   Drug use: No   Sexual activity: Not on file  Other Topics Concern   Not on file  Social History Narrative   Exercise: none regularly   Social Determinants of Health   Financial Resource Strain: Low Risk  (09/07/2021)   Overall Financial Resource Strain (CARDIA)    Difficulty of Paying Living Expenses: Not hard at all  Food Insecurity: No Food Insecurity (09/07/2021)   Hunger Vital Sign    Worried About Running Out of Food in the Last Year: Never true    Ran Out of Food in  the Last Year: Never true  Transportation Needs: No Transportation Needs (09/07/2021)   PRAPARE - Hydrologist (Medical): No    Lack of Transportation (Non-Medical): No  Physical Activity: Sufficiently Active (09/07/2021)   Exercise Vital Sign    Days of Exercise per Week: 5 days    Minutes of Exercise per Session: 30 min  Stress: No Stress Concern Present (09/07/2021)   [place]    Feeling of Stress : Not at all  Social Connections: [place] (  09/07/2021)   Social Connection and Isolation Panel [NHANES]    Frequency of Communication with Friends and Family: More than three times a week    Frequency of Social Gatherings with Friends and Family: More than three times a week    Attends Religious Services: 1 to 4 times per year    Active Member of [employer] or Organizations: Yes    Attends Archivist Meetings: 1 to 4 times per year    Marital Status: Married  Human resources officer Violence: Not At Risk (09/07/2021)   Humiliation, Afraid, Rape, and Kick questionnaire    Fear of Current or Ex-Partner: No    Emotionally Abused: No    Physically Abused: No    Sexually Abused: No     REVIEW OF SYSTEMS:   '[X]'$  denotes positive finding, '[ ]'$  denotes negative finding Cardiac  Comments:  Chest pain or chest pressure:    Shortness of breath upon exertion:    Short of breath when lying flat:    Irregular heart rhythm:        Vascular    Pain in calf, thigh, or hip brought on by ambulation:    Pain in feet at night that wakes you up from your sleep:     Blood clot in your veins:    Leg swelling:         Pulmonary    Oxygen at home:    Productive cough:     Wheezing:         Neurologic    Sudden weakness in arms or legs:     Sudden numbness in arms or legs:     Sudden onset of difficulty speaking or slurred speech:    Temporary loss of vision in one eye:     Problems with dizziness:          Gastrointestinal    Blood in stool:     Vomited blood:         Genitourinary    Burning when urinating:  x   Blood in urine:        Psychiatric    Major depression:         Hematologic    Bleeding problems:    Problems with blood clotting too easily:        Skin    Rashes or ulcers:        Constitutional    Fever or chills:      PHYSICAL EXAMINATION:  Today's Vitals   01/23/22 0940 01/23/22 0941  BP: 108/65 92/60  Pulse: 65   Temp: 98.8 F (37.1 C)   TempSrc: Temporal   SpO2: 99%   Weight: 198 lb 8 oz (90 kg)   Height: '6\' 2"'$  (1.88 m)    Body mass index is 25.49 kg/m.   General:  WDWN in NAD; vital signs documented above Gait: Not observed HENT: WNL, normocephalic Pulmonary: normal non-labored breathing Cardiac: regular HR, without carotid bruits Skin: without rashes Vascular Exam/Pulses: Radial pulses are palpable bilaterally Extremities: without open wounds Musculoskeletal: no muscle wasting or atrophy  Neurologic: A&O X 3; moving all extremities equally; speech is fluent/normal Psychiatric:  The pt has Normal affect.   Non-Invasive Vascular Imaging:   Carotid Duplex on 01/23/2022 Right:  1-39% ICA stenosis Left:  1-39% ICA stenosis Vertebrals:  Bilateral vertebral arteries demonstrate antegrade flow.  Subclavians: Normal flow hemodynamics were seen in bilateral subclavian  arteries   Previous Carotid duplex on 01/23/2021: Right: 1-39% ICA stenosis Left:  1-39% ICA stenosis    ASSESSMENT/PLAN:: 71 y.o. male here for follow up carotid artery stenosis and is s/p right CEA for symptomatic (right retinal embolus) carotid artery stenosis on 04/11/2020 by Dr. Oneida Alar.   -duplex today reveals 1-39% bilateral ICAs stenosis and he remains asymptomatic. -discussed s/s of stroke with pt and he understands should he develop any of these sx, he will go to the nearest ER or call 911. -pt will f/u in one year with carotid duplex -pt will call sooner  should he have any issues. -continue statin/asa  -he has appt with his PCP in a couple of weeks.  I asked him to record  his blood pressure twice a day and take with him.  I also asked him to take his BP cuff so it could be checked against their blood pressure reading for accuracy.   Leontine Locket, [facility] [phone]  Clinic MD:  Carlis Abbott

## 2022-02-11 ENCOUNTER — Encounter: Payer: Self-pay | Admitting: Internal Medicine

## 2022-02-11 NOTE — Progress Notes (Unsigned)
Subjective:    Patient ID: Charles Tate, male    DOB: April 11, 1950, 71 y.o.   MRN: [id number]     HPI Charles Tate is here for a physical exam.   He has no concerns.  Overall feeling well.   Medications and allergies reviewed with patient and updated if appropriate.  Current Outpatient Medications on File Prior to Visit  Medication Sig Dispense Refill   aspirin EC 81 MG tablet Take 81 mg by mouth daily. Swallow whole.     atorvastatin (LIPITOR) 40 MG tablet TAKE 1 TABLET BY MOUTH EVERY DAY 90 tablet 3   azelastine (ASTELIN) 0.1 % nasal spray Place 1 spray into both nostrils 2 (two) times daily. Use in each nostril as directed 30 mL 12   Colostrum 500 MG CAPS Take 500 mg by mouth daily.     fluticasone (FLONASE) 50 MCG/ACT nasal spray SPRAY 2 SPRAYS INTO EACH NOSTRIL EVERY DAY (Patient taking differently: Place 1 spray into both nostrils daily as needed for allergies.) 16 mL 6   Misc Natural Products (SUPER GREENS PO) Take 1 Scoop by mouth daily.      No current facility-administered medications on file prior to visit.    Review of Systems  Constitutional:  Negative for chills and fever.  Eyes:  Negative for visual disturbance.  Respiratory:  Negative for cough, shortness of breath and wheezing.   Cardiovascular:  Negative for chest pain, palpitations and leg swelling.  Gastrointestinal:  Negative for abdominal pain, blood in stool, constipation, diarrhea and nausea.       No gerd  Genitourinary:  Positive for difficulty urinating and dysuria (at times). Negative for hematuria.  Musculoskeletal:  Negative for arthralgias and back pain.  Skin:  Negative for rash.  Neurological:  Negative for light-headedness and headaches.  Psychiatric/Behavioral:  Negative for dysphoric mood. The patient is not nervous/anxious.        Objective:   Vitals:   02/12/22 0825  BP: 110/74  Pulse: 67  Temp: 97.8 F (36.6 C)  SpO2: 98%   Filed Weights   02/12/22 0825  Weight: 197 lb  (89.4 kg)   Body mass index is 25.29 kg/m.  BP Readings from Last 3 Encounters:  02/12/22 110/74  01/23/22 92/60  08/11/21 112/80    Wt Readings from Last 3 Encounters:  02/12/22 197 lb (89.4 kg)  01/23/22 198 lb 8 oz (90 kg)  09/07/21 193 lb (87.5 kg)      Physical Exam Constitutional: He appears well-developed and well-nourished. No distress.  HENT:  Head: Normocephalic and atraumatic.  Right Ear: External ear normal.  Left Ear: External ear normal.  Mouth/Throat: Oropharynx is clear and moist.  Normal ear canals and TM b/l  Eyes: Conjunctivae and EOM are normal.  Neck: Neck supple. No tracheal deviation present. No thyromegaly present.  No carotid bruit  Cardiovascular: Normal rate, regular rhythm, normal heart sounds and intact distal pulses.   No murmur heard. Pulmonary/Chest: Effort normal and breath sounds normal. No respiratory distress. He has no wheezes. He has no rales.  Abdominal: Soft. He exhibits no distension. There is no tenderness.  Genitourinary: deferred  Musculoskeletal: He exhibits no edema.  Lymphadenopathy:   He has no cervical adenopathy.  Skin: Skin is warm and dry. He is not diaphoretic.  Psychiatric: He has a normal mood and affect. His behavior is normal.         Assessment & Plan:   Physical exam: Screening blood work  ordered Exercise  active-no regular exercise Weight  good for age Substance abuse   none   Reviewed recommended immunizations.   Health Maintenance  Topic Date Due   Zoster Vaccines- Shingrix (1 of 2) Never done   DTaP/Tdap/Td (2 - Tdap) 05/03/2020   INFLUENZA VACCINE  10/03/2021   COVID-19 Vaccine (3 - 2023-24 season) 11/03/2021   Medicare Annual Wellness (AWV)  09/08/2022   COLONOSCOPY (Pts 45-27yr Insurance coverage will need to be confirmed)  05/30/2025   Pneumonia Vaccine 72 Years old  Completed   Hepatitis C Screening  Completed   HPV VACCINES  Aged Out     See Problem List for Assessment and  Plan of chronic medical problems.

## 2022-02-11 NOTE — Patient Instructions (Addendum)
Blood work was ordered.   The lab is on the first floor.    Medications changes include :   stop telmisartan- hctz and start telmisartan 80 mg daily    Return in about 6 months (around 08/14/2022) for follow up.    Health Maintenance, Male Adopting a healthy lifestyle and getting preventive care are important in promoting health and wellness. Ask your health care provider about: The right schedule for you to have regular tests and exams. Things you can do on your own to prevent diseases and keep yourself healthy. What should I know about diet, weight, and exercise? Eat a healthy diet  Eat a diet that includes plenty of vegetables, fruits, low-fat dairy products, and lean protein. Do not eat a lot of foods that are high in solid fats, added sugars, or sodium. Maintain a healthy weight Body mass index (BMI) is a measurement that can be used to identify possible weight problems. It estimates body fat based on height and weight. Your health care provider can help determine your BMI and help you achieve or maintain a healthy weight. Get regular exercise Get regular exercise. This is one of the most important things you can do for your health. Most adults should: Exercise for at least 150 minutes each week. The exercise should increase your heart rate and make you sweat (moderate-intensity exercise). Do strengthening exercises at least twice a week. This is in addition to the moderate-intensity exercise. Spend less time sitting. Even light physical activity can be beneficial. Watch cholesterol and blood lipids Have your blood tested for lipids and cholesterol at 72 years of age, then have this test every 5 years. You may need to have your cholesterol levels checked more often if: Your lipid or cholesterol levels are high. You are older than 72 years of age. You are at high risk for heart disease. What should I know about cancer screening? Many types of cancers can be detected  early and may often be prevented. Depending on your health history and family history, you may need to have cancer screening at various ages. This may include screening for: Colorectal cancer. Prostate cancer. Skin cancer. Lung cancer. What should I know about heart disease, diabetes, and high blood pressure? Blood pressure and heart disease High blood pressure causes heart disease and increases the risk of stroke. This is more likely to develop in people who have high blood pressure readings or are overweight. Talk with your health care provider about your target blood pressure readings. Have your blood pressure checked: Every 3-5 years if you are 17-28 years of age. Every year if you are 79 years old or older. If you are between the ages of 60 and 53 and are a current or former smoker, ask your health care provider if you should have a one-time screening for abdominal aortic aneurysm (AAA). Diabetes Have regular diabetes screenings. This checks your fasting blood sugar level. Have the screening done: Once every three years after age 25 if you are at a normal weight and have a low risk for diabetes. More often and at a younger age if you are overweight or have a high risk for diabetes. What should I know about preventing infection? Hepatitis B If you have a higher risk for hepatitis B, you should be screened for this virus. Talk with your health care provider to find out if you are at risk for hepatitis B infection. Hepatitis C Blood testing is recommended for: Everyone born  from [place] through 1965. Anyone with known risk factors for hepatitis C. Sexually transmitted infections (STIs) You should be screened each year for STIs, including gonorrhea and chlamydia, if: You are sexually active and are younger than 72 years of age. You are older than 72 years of age and your health care provider tells you that you are at risk for this type of infection. Your sexual activity has changed since  you were last screened, and you are at increased risk for chlamydia or gonorrhea. Ask your health care provider if you are at risk. Ask your health care provider about whether you are at high risk for HIV. Your health care provider may recommend a prescription medicine to help prevent HIV infection. If you choose to take medicine to prevent HIV, you should first get tested for HIV. You should then be tested every 3 months for as long as you are taking the medicine. Follow these instructions at home: Alcohol use Do not drink alcohol if your health care provider tells you not to drink. If you drink alcohol: Limit how much you have to 0-2 drinks a day. Know how much alcohol is in your drink. In the [place] one drink equals one 12 oz bottle of beer (355 mL), one 5 oz glass of wine (148 mL), or one 1 oz glass of hard liquor (44 mL). Lifestyle Do not use any products that contain nicotine or tobacco. These products include cigarettes, chewing tobacco, and vaping devices, such as e-cigarettes. If you need help quitting, ask your health care provider. Do not use street drugs. Do not share needles. Ask your health care provider for help if you need support or information about quitting drugs. General instructions Schedule regular health, dental, and eye exams. Stay current with your vaccines. Tell your health care provider if: You often feel depressed. You have ever been abused or do not feel safe at home. Summary Adopting a healthy lifestyle and getting preventive care are important in promoting health and wellness. Follow your health care provider's instructions about healthy diet, exercising, and getting tested or screened for diseases. Follow your health care provider's instructions on monitoring your cholesterol and blood pressure. This information is not intended to replace advice given to you by your health care provider. Make sure you discuss any questions you have with your health care  provider. Document Revised: 07/11/2020 Document Reviewed: 07/11/2020 Elsevier Patient Education  [place].

## 2022-02-12 ENCOUNTER — Ambulatory Visit (INDEPENDENT_AMBULATORY_CARE_PROVIDER_SITE_OTHER): Payer: Medicare HMO | Admitting: Internal Medicine

## 2022-02-12 VITALS — BP 110/74 | HR 67 | Temp 97.8°F | Ht 74.0 in | Wt 197.0 lb

## 2022-02-12 DIAGNOSIS — Z125 Encounter for screening for malignant neoplasm of prostate: Secondary | ICD-10-CM | POA: Diagnosis not present

## 2022-02-12 DIAGNOSIS — E782 Mixed hyperlipidemia: Secondary | ICD-10-CM | POA: Diagnosis not present

## 2022-02-12 DIAGNOSIS — Z0000 Encounter for general adult medical examination without abnormal findings: Secondary | ICD-10-CM | POA: Diagnosis not present

## 2022-02-12 DIAGNOSIS — N401 Enlarged prostate with lower urinary tract symptoms: Secondary | ICD-10-CM | POA: Diagnosis not present

## 2022-02-12 DIAGNOSIS — I6521 Occlusion and stenosis of right carotid artery: Secondary | ICD-10-CM

## 2022-02-12 DIAGNOSIS — I700 Atherosclerosis of aorta: Secondary | ICD-10-CM | POA: Diagnosis not present

## 2022-02-12 DIAGNOSIS — N138 Other obstructive and reflux uropathy: Secondary | ICD-10-CM | POA: Diagnosis not present

## 2022-02-12 DIAGNOSIS — I10 Essential (primary) hypertension: Secondary | ICD-10-CM

## 2022-02-12 DIAGNOSIS — R7303 Prediabetes: Secondary | ICD-10-CM

## 2022-02-12 LAB — CBC WITH DIFFERENTIAL/PLATELET
Basophils Absolute: 0 10*3/uL (ref 0.0–0.1)
Basophils Relative: 1 % (ref 0.0–3.0)
Eosinophils Absolute: 0.1 10*3/uL (ref 0.0–0.7)
Eosinophils Relative: 2.4 % (ref 0.0–5.0)
HCT: 43.4 % (ref 39.0–52.0)
Hemoglobin: 14.8 g/dL (ref 13.0–17.0)
Lymphocytes Relative: 13.8 % (ref 12.0–46.0)
Lymphs Abs: 0.5 10*3/uL — ABNORMAL LOW (ref 0.7–4.0)
MCHC: 34.2 g/dL (ref 30.0–36.0)
MCV: 88.2 fl (ref 78.0–100.0)
Monocytes Absolute: 0.4 10*3/uL (ref 0.1–1.0)
Monocytes Relative: 10.8 % (ref 3.0–12.0)
Neutro Abs: 2.6 10*3/uL (ref 1.4–7.7)
Neutrophils Relative %: 72 % (ref 43.0–77.0)
Platelets: 189 10*3/uL (ref 150.0–400.0)
RBC: 4.92 Mil/uL (ref 4.22–5.81)
RDW: 13.2 % (ref 11.5–15.5)
WBC: 3.6 10*3/uL — ABNORMAL LOW (ref 4.0–10.5)

## 2022-02-12 LAB — COMPREHENSIVE METABOLIC PANEL
ALT: 30 U/L (ref 0–53)
AST: 29 U/L (ref 0–37)
Albumin: 4.3 g/dL (ref 3.5–5.2)
Alkaline Phosphatase: 68 U/L (ref 39–117)
BUN: 10 mg/dL (ref 6–23)
CO2: 30 mEq/L (ref 19–32)
Calcium: 9.1 mg/dL (ref 8.4–10.5)
Chloride: 101 mEq/L (ref 96–112)
Creatinine, Ser: 0.79 mg/dL (ref 0.40–1.50)
GFR: 88.68 mL/min (ref >60.00)
Glucose, Bld: 92 mg/dL (ref 70–99)
Potassium: 4.2 mEq/L (ref 3.5–5.1)
Sodium: 139 mEq/L (ref 135–145)
Total Bilirubin: 0.6 mg/dL (ref 0.2–1.2)
Total Protein: 6.8 g/dL (ref 6.0–8.3)

## 2022-02-12 LAB — PSA, MEDICARE: PSA: 3.9 ng/ml (ref 0.10–4.00)

## 2022-02-12 LAB — HEMOGLOBIN A1C: Hgb A1c MFr Bld: 6.1 % (ref 4.6–6.5)

## 2022-02-12 LAB — LIPID PANEL
Cholesterol: 128 mg/dL (ref 0–200)
HDL: 36.8 mg/dL — ABNORMAL LOW (ref >39.00)
LDL Cholesterol: 72 mg/dL (ref 0–99)
NonHDL: 91.11
Total CHOL/HDL Ratio: 3
Triglycerides: 97 mg/dL (ref 0.0–149.0)
VLDL: 19.4 mg/dL (ref 0.0–40.0)

## 2022-02-12 LAB — TSH: TSH: 1.37 u[IU]/mL (ref 0.35–5.50)

## 2022-02-12 MED ORDER — TELMISARTAN 80 MG PO TABS: 80.0000 mg | ORAL_TABLET | Freq: Every day | ORAL | 3 refills | Status: DC

## 2022-02-12 NOTE — Assessment & Plan Note (Addendum)
Chronic Blood pressure over controlled and does have occasional lightheadedness-also needs to go on prostate medication which is more likely to cause low BP, orthostasis and lightheadedness CMP stop telmisartan-hydrochlorothiazide 80-12.5 mg daily - start telmisartan 80 mg daily

## 2022-02-12 NOTE — Assessment & Plan Note (Signed)
Chronic Following with urology Started on tamsulosin and that did help, but had side effects Started on Uroxatrol and that did not seem to help-advised that maybe he retry it Has follow-up with urology next month PSA today

## 2022-02-12 NOTE — Assessment & Plan Note (Addendum)
Chronic Following with vascular S/p CEA Continue atorvastatin 40 mg daily, aspirin 81 mg daily Encouraged healthy diet, regular exercise

## 2022-02-12 NOTE — Assessment & Plan Note (Signed)
Chronic Continue atorvastatin 40 mg daily Continue healthy diet, increase activity/exercise

## 2022-02-12 NOTE — Assessment & Plan Note (Signed)
Chronic Check a1c Low sugar / carb diet Stressed regular exercise  

## 2022-02-12 NOTE — Assessment & Plan Note (Signed)
Chronic Regular exercise and healthy diet encouraged Check lipid panel  Continue atorvastatin 40 mg daily 

## 2022-02-14 ENCOUNTER — Encounter: Payer: Self-pay | Admitting: Internal Medicine

## 2022-03-01 ENCOUNTER — Other Ambulatory Visit: Payer: Self-pay

## 2022-03-01 DIAGNOSIS — J0130 Acute sphenoidal sinusitis, unspecified: Secondary | ICD-10-CM

## 2022-03-01 MED ORDER — FLUTICASONE PROPIONATE 50 MCG/ACT NA SUSP: 2.0000 | Freq: Every day | NASAL | 6 refills | Status: DC

## 2022-03-18 ENCOUNTER — Encounter: Payer: Self-pay | Admitting: Internal Medicine

## 2022-03-18 NOTE — Progress Notes (Unsigned)
    Subjective:    Patient ID: Charles Tate, male    DOB: [birth year], 72 y.o.   MRN: [id number]      HPI Charles Tate is here for No chief complaint on file.   Sinus issues -      Medications and allergies reviewed with patient and updated if appropriate.  Current Outpatient Medications on File Prior to Visit  Medication Sig Dispense Refill   aspirin EC 81 MG tablet Take 81 mg by mouth daily. Swallow whole.     atorvastatin (LIPITOR) 40 MG tablet TAKE 1 TABLET BY MOUTH EVERY DAY 90 tablet 3   azelastine (ASTELIN) 0.1 % nasal spray Place 1 spray into both nostrils 2 (two) times daily. Use in each nostril as directed 30 mL 12   Colostrum 500 MG CAPS Take 500 mg by mouth daily.     fluticasone (FLONASE) 50 MCG/ACT nasal spray Place 2 sprays into both nostrils daily. 16 mL 6   Misc Natural Products (SUPER GREENS PO) Take 1 Scoop by mouth daily.      telmisartan (MICARDIS) 80 MG tablet Take 1 tablet (80 mg total) by mouth daily. 90 tablet 3   No current facility-administered medications on file prior to visit.    Review of Systems     Objective:  There were no vitals filed for this visit. BP Readings from Last 3 Encounters:  02/12/22 110/74  01/23/22 92/60  08/11/21 112/80   Wt Readings from Last 3 Encounters:  02/12/22 197 lb (89.4 kg)  01/23/22 198 lb 8 oz (90 kg)  09/07/21 193 lb (87.5 kg)   There is no height or weight on file to calculate BMI.    Physical Exam         Assessment & Plan:    See Problem List for Assessment and Plan of chronic medical problems.

## 2022-03-18 NOTE — Patient Instructions (Addendum)
      Medications changes include :   Augmentin twice daily x 5 days    Return if symptoms worsen or fail to improve.  

## 2022-03-19 ENCOUNTER — Ambulatory Visit (INDEPENDENT_AMBULATORY_CARE_PROVIDER_SITE_OTHER): Payer: Medicare HMO | Admitting: Internal Medicine

## 2022-03-19 VITALS — BP 122/72 | HR 64 | Temp 98.3°F | Ht 74.0 in | Wt 201.0 lb

## 2022-03-19 DIAGNOSIS — I10 Essential (primary) hypertension: Secondary | ICD-10-CM | POA: Diagnosis not present

## 2022-03-19 DIAGNOSIS — J329 Chronic sinusitis, unspecified: Secondary | ICD-10-CM | POA: Insufficient documentation

## 2022-03-19 DIAGNOSIS — J0100 Acute maxillary sinusitis, unspecified: Secondary | ICD-10-CM | POA: Diagnosis not present

## 2022-03-19 MED ORDER — AMOXICILLIN-POT CLAVULANATE 875-125 MG PO TABS: 1.0000 | ORAL_TABLET | Freq: Two times a day (BID) | ORAL | 0 refills | Status: DC

## 2022-03-19 NOTE — Assessment & Plan Note (Addendum)
subacute-started 2-1/2 weeks ago Likely bacterial  Start Augmentin 875-125 mg BID x 5 day Continue saline nasal spray otc cold medications Rest, fluid Call if no improvement

## 2022-03-19 NOTE — Assessment & Plan Note (Signed)
Chronic Blood pressure is well-controlled Continue telmisartan 80 mg daily

## 2022-03-21 DIAGNOSIS — D225 Melanocytic nevi of trunk: Secondary | ICD-10-CM | POA: Diagnosis not present

## 2022-03-21 DIAGNOSIS — L578 Other skin changes due to chronic exposure to nonionizing radiation: Secondary | ICD-10-CM | POA: Diagnosis not present

## 2022-03-26 ENCOUNTER — Encounter: Payer: Self-pay | Admitting: Internal Medicine

## 2022-03-30 ENCOUNTER — Other Ambulatory Visit: Payer: Self-pay | Admitting: Internal Medicine

## 2022-03-30 ENCOUNTER — Ambulatory Visit: Payer: Medicare HMO

## 2022-03-30 VITALS — BP 150/70

## 2022-03-30 DIAGNOSIS — I10 Essential (primary) hypertension: Secondary | ICD-10-CM

## 2022-03-30 MED ORDER — HYDROCHLOROTHIAZIDE 25 MG PO TABS: 25.0000 mg | ORAL_TABLET | Freq: Every day | ORAL | 3 refills | Status: DC

## 2022-03-30 NOTE — Progress Notes (Deleted)
Patient walked in today for visit to have blood pressure rechecked.  Patient brought back and BP checked. Left arm today was 150/70.  Message to be sent

## 2022-03-30 NOTE — Progress Notes (Signed)
Spoke with patient and info given.  3 week follow up made.

## 2022-03-30 NOTE — Progress Notes (Signed)
Patient seen on nurse schedule today for blood pressure check.  Blood pressure reading today documented at 150/70 and patient reports blood pressure readings have been fluctuating.  Patient reports readings being elevated since last visit.  Provider will advise on any medication changes.

## 2022-03-30 NOTE — Progress Notes (Signed)
Blood pressure has been high-came in today for nurse visit.  Add hydrochlorothiazide to current medications.  Medications in the pharmacy.  Please call him and schedule follow-up in 3 weeks for blood pressure-will do blood work at that time to check kidney function, potassium with the new medication.

## 2022-04-03 ENCOUNTER — Encounter: Payer: Self-pay | Admitting: Internal Medicine

## 2022-04-04 ENCOUNTER — Ambulatory Visit
Admission: EM | Admit: 2022-04-04 | Discharge: 2022-04-04 | Disposition: A | Discharge status: Home / Self Care | Payer: Medicare HMO | Attending: Nurse Practitioner | Admitting: Nurse Practitioner

## 2022-04-04 DIAGNOSIS — J329 Chronic sinusitis, unspecified: Secondary | ICD-10-CM

## 2022-04-04 MED ORDER — PREDNISONE 20 MG PO TABS: 40.0000 mg | ORAL_TABLET | Freq: Every day | ORAL | 0 refills | Status: AC

## 2022-04-04 NOTE — ED Triage Notes (Signed)
Pt c/o runny nose, facial pressure, post nasal drainage.  Started: over a month ago   Home interventions: flonase

## 2022-04-04 NOTE — Discharge Instructions (Signed)
Continue Flonase Start prednisone daily for 5 days Nasal rinses/Nettie pot as tolerated Rest and fluids Follow-up with your PCP if symptoms do not improve Please go to the emergency room if you have any worsening symptoms

## 2022-04-04 NOTE — ED Provider Notes (Signed)
[facility]    CSN: [id number] Arrival date & time: 04/04/22  1151      History   Chief Complaint Chief Complaint  Patient presents with   Ear Fullness    Sinusitis - Entered by patient   Facial Pain   Nasal Congestion    HPI Charles Tate is a 72 y.o. male presents for evaluation of sinus pain.  Patient has a history of chronic sinus infections.  He was seen by his PCP on 1/15 and treated with 5 days of Augmentin for sinusitis.  He states symptoms improved and not completely resolved.  He continues to have sinus pressure with ear fullness.  States nasal discharge is clear.  Does have some postnasal drip with hoarseness.  No sore throat, fevers, cough.  He does use Flonase daily.  No other concerns at this time.   Ear Fullness    Past Medical History:  Diagnosis Date   Allergy    Arthritis    left hip    GERD (gastroesophageal reflux disease)    per patient, resolved. 03/15/20   Heart murmur    adolescent rheumatic fever - developed murmur    Hyperlipidemia    Hypertension    RAD (reactive airway disease)    with RTIs   Tubular adenoma     Patient Active Problem List   Diagnosis Date Noted   Sinus infection 03/19/2022   COVID 11/13/2021   Nuclear sclerotic cataract of both eyes 11/08/2020   Right shoulder pain 06/27/2020   Paronychia of right middle finger 06/27/2020   S/P carotid endarterectomy, right 04/11/2020   Serrated adenoma of colon 03/12/2020   Aortic atherosclerosis ([place]) 02/03/2020   Stenosis of right carotid artery 01/26/2020   Branch retinal artery occlusion, right eye 01/19/2020   Posterior vitreous detachment of both eyes 01/19/2020   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) 08/31/2019   Tear of MCL (medial collateral ligament) of knee, left, initial encounter 08/05/2017   Gluteal tendinitis of left buttock 08/05/2017   Left inguinal hernia 07/04/2017   Allergic rhinitis 12/28/2015   H/O: rheumatic fever  12/28/2015   Prediabetes 12/27/2015   Rising PSA level 02/21/2014   Personal history of colonic polyps 02/17/2014   BPH with obstruction/lower urinary tract symptoms 05/04/2010   Hyperlipidemia 01/07/2009   Asthma 06/04/2007   Essential hypertension 09/26/2006    Past Surgical History:  Procedure Laterality Date   COLONOSCOPY     colonoscopy with polypectomy  2013   Dr Ferdinand Lango, [place]   COLONOSCOPY WITH PROPOFOL N/A 07/01/2019   Procedure: COLONOSCOPY WITH PROPOFOL;  Surgeon: Irving Copas., MD;  Location: [facility];  Service: Gastroenterology;  Laterality: N/A;   COLONOSCOPY WITH PROPOFOL N/A 05/30/2020   Procedure: COLONOSCOPY WITH PROPOFOL;  Surgeon: Rush Landmark Telford Nab., MD;  Location: [facility];  Service: Gastroenterology;  Laterality: N/A;   ENDARTERECTOMY Right 04/11/2020   Procedure: Right Carotid Endarterectomy;  Surgeon: Elam Dutch, MD;  Location: [facility];  Service: Vascular;  Laterality: Right;   ENDOSCOPIC MUCOSAL RESECTION N/A 07/01/2019   Procedure: ENDOSCOPIC MUCOSAL RESECTION;  Surgeon: Rush Landmark Telford Nab., MD;  Location: [facility];  Service: Gastroenterology;  Laterality: N/A;   ENDOSCOPIC MUCOSAL RESECTION N/A 05/30/2020   Procedure: ENDOSCOPIC MUCOSAL RESECTION;  Surgeon: Rush Landmark Telford Nab., MD;  Location: [facility];  Service: Gastroenterology;  Laterality: N/A;   HEMOSTASIS CLIP PLACEMENT  07/01/2019   Procedure: HEMOSTASIS CLIP PLACEMENT;  Surgeon: Irving Copas., MD;  Location: [facility];  Service: Gastroenterology;;   POLYPECTOMY     POLYPECTOMY  07/01/2019   Procedure: POLYPECTOMY;  Surgeon: Rush Landmark Telford Nab., MD;  Location: [facility];  Service: Gastroenterology;;   POLYPECTOMY  05/30/2020   Procedure: POLYPECTOMY;  Surgeon: Irving Copas., MD;  Location: [facility];  Service: Gastroenterology;;   Maryagnes Amos INJECTION  07/01/2019   Procedure: SUBMUCOSAL LIFTING INJECTION;  Surgeon:  Irving Copas., MD;  Location: [facility];  Service: Gastroenterology;;   SUBMUCOSAL TATTOO INJECTION  07/01/2019   Procedure: SUBMUCOSAL TATTOO INJECTION;  Surgeon: Irving Copas., MD;  Location: [facility];  Service: Gastroenterology;;   [place] Medications    Prior to Admission medications   Medication Sig Start Date End Date Taking? Authorizing Provider  predniSONE (DELTASONE) 20 MG tablet Take 2 tablets (40 mg total) by mouth daily with breakfast for 5 days. 04/04/22 04/09/22 Yes Melynda Ripple, NP  amoxicillin-clavulanate (AUGMENTIN) 875-125 MG tablet Take 1 tablet by mouth 2 (two) times daily for 5 days. 03/19/22 03/24/22  [name], MD  aspirin EC 81 MG tablet Take 81 mg by mouth daily. Swallow whole.    [provider]  atorvastatin (LIPITOR) 40 MG tablet TAKE 1 TABLET BY MOUTH EVERY DAY 01/15/22   Burns, Claudina Lick, MD  azelastine (ASTELIN) 0.1 % nasal spray Place 1 spray into both nostrils 2 (two) times daily. Use in each nostril as directed 05/30/21   Maximiano Coss, NP  Colostrum 500 MG CAPS Take 500 mg by mouth daily.    [provider]  fluticasone (FLONASE) 50 MCG/ACT nasal spray Place 2 sprays into both nostrils daily. 03/01/22   [name], MD  hydrochlorothiazide (HYDRODIURIL) 25 MG tablet Take 1 tablet (25 mg total) by mouth daily. 03/30/22   [name], MD  Misc Natural Products (SUPER GREENS PO) Take 1 Scoop by mouth daily.     [provider]  telmisartan (MICARDIS) 80 MG tablet Take 1 tablet (80 mg total) by mouth daily. 02/12/22   [name], MD    Family History Family History  Problem Relation Age of Onset   Arthritis Mother    Hyperlipidemia Mother    Lupus Mother    Heart attack Father 59   Hypertension Sister    Heart attack Paternal Grandmother 68   Diabetes Paternal Aunt    Stroke Maternal Grandfather 63   Cancer Paternal Grandfather        ? stomach; in 2s    Colon cancer Paternal Grandfather    Colon polyps Neg Hx    Esophageal cancer Neg Hx    Rectal cancer Neg Hx    Stomach cancer Neg Hx    Inflammatory bowel disease Neg Hx    Liver disease Neg Hx    Pancreatic cancer Neg Hx     Social History Social History   Tobacco Use   Smoking status: Former   Smokeless tobacco: Never   Tobacco comments:    intermittent, short term smoker as a teen. Some second hand smoke in 1s & 30s  Vaping Use   Vaping Use: Never used  Substance Use Topics   Alcohol use: Yes    Comment:   very rarely   Drug use: No     Allergies   Ciprofloxacin hcl, Cefdinir, Tamsulosin, and Lisinopril   Review of Systems Review of Systems  HENT:  Positive for ear pain, sinus pressure and sinus  pain.      Physical Exam Triage Vital Signs ED Triage Vitals  Enc Vitals Group     BP 04/04/22 1203 127/63     Pulse Rate 04/04/22 1203 82     Resp 04/04/22 1203 16     Temp 04/04/22 1203 97.7 F (36.5 C)     Temp Source 04/04/22 1203 Oral     SpO2 04/04/22 1203 95 %     Weight --      Height --      Head Circumference --      Peak Flow --      Pain Score 04/04/22 1201 0     Pain Loc --      Pain Edu? --      Excl. in [place]? --    No data found.  Updated Vital Signs BP 127/63 (BP Location: Right Arm)   Pulse 82   Temp 97.7 F (36.5 C) (Oral)   Resp 16   SpO2 95%   Visual Acuity Right Eye Distance:   Left Eye Distance:   Bilateral Distance:    Right Eye Near:   Left Eye Near:    Bilateral Near:     Physical Exam Vitals and nursing note reviewed.  Constitutional:      General: He is not in acute distress.    Appearance: Normal appearance. He is not ill-appearing.  HENT:     Head: Normocephalic and atraumatic.     Right Ear: Tympanic membrane and ear canal normal.     Left Ear: Tympanic membrane and ear canal normal.     Nose: Congestion present.     Right Turbinates: Pale. Not swollen.     Left Turbinates: Pale. Not swollen.     Right  Sinus: Maxillary sinus tenderness present. No frontal sinus tenderness.     Left Sinus: No maxillary sinus tenderness or frontal sinus tenderness.     Mouth/Throat:     Mouth: Mucous membranes are moist.     Pharynx: No oropharyngeal exudate or posterior oropharyngeal erythema.  Eyes:     Pupils: Pupils are equal, round, and reactive to light.  Cardiovascular:     Rate and Rhythm: Normal rate.  Pulmonary:     Effort: Pulmonary effort is normal.  Skin:    General: Skin is warm and dry.  Neurological:     General: No focal deficit present.     Mental Status: He is alert and oriented to person, place, and time.  Psychiatric:        Mood and Affect: Mood normal.        Behavior: Behavior normal.      UC Treatments / Results  Labs (all labs ordered are listed, but only abnormal results are displayed) Labs Reviewed - No data to display  EKG   Radiology No results found.  Procedures Procedures (including critical care time)  Medications Ordered in UC Medications - No data to display  Initial Impression / Assessment and Plan / UC Course  I have reviewed the triage vital signs and the nursing notes.  Pertinent labs & imaging results that were available during my care of the patient were reviewed by me and considered in my medical decision making (see chart for details).     Reviewed exam and symptoms with patient. Start prednisone daily for 5 days.  Do not feel additional antibiotics are warranted at this time Will continue Flonase Nasal rinses as tolerated Follow-up with PCP symptoms do not improve ER  precautions reviewed and patient verbalized understanding Final Clinical Impressions(s) / UC Diagnoses   Final diagnoses:  Chronic sinusitis, unspecified location     Discharge Instructions      Continue Flonase Start prednisone daily for 5 days Nasal rinses/Nettie pot as tolerated Rest and fluids Follow-up with your PCP if symptoms do not improve Please go to  the emergency room if you have any worsening symptoms   ED Prescriptions     Medication Sig Dispense Auth. Provider   predniSONE (DELTASONE) 20 MG tablet Take 2 tablets (40 mg total) by mouth daily with breakfast for 5 days. 10 tablet Melynda Ripple, NP      PDMP not reviewed this encounter.   Melynda Ripple, NP 04/04/22 [phone]

## 2022-04-05 ENCOUNTER — Ambulatory Visit: Payer: Medicare HMO | Admitting: Sports Medicine

## 2022-04-05 ENCOUNTER — Ambulatory Visit (INDEPENDENT_AMBULATORY_CARE_PROVIDER_SITE_OTHER): Payer: Medicare HMO

## 2022-04-05 VITALS — BP 140/80 | HR 104 | Ht 74.0 in | Wt 201.0 lb

## 2022-04-05 DIAGNOSIS — M25562 Pain in left knee: Secondary | ICD-10-CM

## 2022-04-05 DIAGNOSIS — G8929 Other chronic pain: Secondary | ICD-10-CM | POA: Diagnosis not present

## 2022-04-05 NOTE — Progress Notes (Signed)
Charles Tate D.[facility] [place] Phone: [phone]   Assessment and Plan:     1. Chronic pain of left knee -Chronic with exacerbation, initial sports medicine visit - Recurrent flare of left knee pain after fall earlier today.  Pain is most consistent with fall onto knee causing patellar contusion and intra-articular effusion - Patient is already taking prednisone for sinusitis which can also help this condition - Start meloxicam 15 mg daily x2 weeks.  If still having pain after 2 weeks, complete 3rd-week of meloxicam. May use remaining meloxicam as needed once daily for pain control.  Do not to use additional NSAIDs while taking meloxicam.  May use Tylenol [phone] 2 to 3 times a day for breakthrough pain.  Patient says he already has access to meloxicam prescription and does not need a new prescription at this time - Start HEP for knee - May use knee sleeve to decrease swelling and improve proprioception support - May use ice for the first 48 hours to decrease overall swelling - X-ray obtained in clinic.  My interpretation: Cortical density at inferior pole of patella that correlates to area of mild TTP, though not sharp TTP and no significant swelling or bruising.  I do not think that this represents an acute fracture, though will await official radiology review.  No significant degenerative changes. - DG Knee AP/LAT W/Sunrise Left; Future    Pertinent previous records reviewed include none   Follow Up: As needed if no improvement or worsening of symptoms in 2 to 3 weeks.  Could consider intra-articular knee CSI   Subjective:   I, Charles Tate, am serving as a Education administrator for Doctor Charles Tate  Chief Complaint: left knee pain   HPI:   04/05/22 Patient is a 72 year old male complaining of left knee pain. Patient states he fell today he has pain with extension and he has pain when walking he notes some  swelling at the top , no numbness or tingling, no radiating pain when he flexes his foot he has some tightness in his knee,he states his knee has been "popping left or right' but no pain he states he has an "unstable knee"  Is noting some lightheadness and dizziness but states he is taking new blood pressure meds and is on predisone dos pak   Relevant Historical Information:   Hypertension, history of left MCL injury in 2019  Additional pertinent review of systems negative.   Current Outpatient Medications:    amoxicillin-clavulanate (AUGMENTIN) 875-125 MG tablet, Take 1 tablet by mouth 2 (two) times daily for 5 days., Disp: 10 tablet, Rfl: 0   aspirin EC 81 MG tablet, Take 81 mg by mouth daily. Swallow whole., Disp: , Rfl:    atorvastatin (LIPITOR) 40 MG tablet, TAKE 1 TABLET BY MOUTH EVERY DAY, Disp: 90 tablet, Rfl: 3   azelastine (ASTELIN) 0.1 % nasal spray, Place 1 spray into both nostrils 2 (two) times daily. Use in each nostril as directed, Disp: 30 mL, Rfl: 12   Colostrum 500 MG CAPS, Take 500 mg by mouth daily., Disp: , Rfl:    fluticasone (FLONASE) 50 MCG/ACT nasal spray, Place 2 sprays into both nostrils daily., Disp: 16 mL, Rfl: 6   hydrochlorothiazide (HYDRODIURIL) 25 MG tablet, Take 1 tablet (25 mg total) by mouth daily., Disp: 30 tablet, Rfl: 3   Misc Natural Products (SUPER GREENS PO), Take 1 Scoop by mouth daily. , Disp: , Rfl:  predniSONE (DELTASONE) 20 MG tablet, Take 2 tablets (40 mg total) by mouth daily with breakfast for 5 days., Disp: 10 tablet, Rfl: 0   telmisartan (MICARDIS) 80 MG tablet, Take 1 tablet (80 mg total) by mouth daily., Disp: 90 tablet, Rfl: 3   Objective:     Vitals:   04/05/22 1517  BP: (!) 140/80  Pulse: (!) 104  SpO2: 97%  Weight: 201 lb (91.2 kg)  Height: '6\' 2"'$  (1.88 m)      Body mass index is 25.81 kg/m.    Physical Exam:    General:  awake, alert oriented, no acute distress nontoxic Skin: no suspicious lesions or  rashes Neuro:sensation intact and strength 5/5 with no deficits, no atrophy, normal muscle tone Psych: No signs of anxiety, depression or other mood disorder  Left knee: Moderate swelling No deformity Positive fluid wave, joint milking Abrasion superficial to patella ROM Flex 100, Ext 10 TTP patella NTTP over the quad tendon, medial fem condyle, lat fem condyle, patella, plica, patella tendon, tibial tuberostiy, fibular head, posterior fossa, pes anserine bursa, gerdy's tubercle, medial jt line, lateral jt line Neg anterior and posterior drawer Neg lachman, though mild laxity with sharp endpoint that was equivocal bilaterally Neg sag sign Negative varus stress Negative valgus stress Negative [place], though caused anterior knee pain Positive [place] for anterior knee pain  Gait normal    Electronically signed by:  Charles Tate D.[facility] 4:12 PM 04/05/22

## 2022-04-05 NOTE — Patient Instructions (Addendum)
Good to see you  - Start meloxicam 15 mg daily x2 weeks.  If still having pain after 2 weeks, complete 3rd-week of meloxicam. May use remaining meloxicam as needed once daily for pain control.  Do not to use additional NSAIDs while taking meloxicam.  May use Tylenol [phone] mg 2 to 3 times a day for breakthrough pain. Continue prednisone course Recommend getting a knee sleeve  Knee HEP As needed follow up if no improvement 2-3 weeks

## 2022-04-06 ENCOUNTER — Other Ambulatory Visit: Payer: Self-pay | Admitting: Sports Medicine

## 2022-04-06 ENCOUNTER — Encounter: Payer: Self-pay | Admitting: Sports Medicine

## 2022-04-06 DIAGNOSIS — G8929 Other chronic pain: Secondary | ICD-10-CM

## 2022-04-06 DIAGNOSIS — M25562 Pain in left knee: Secondary | ICD-10-CM | POA: Diagnosis not present

## 2022-04-06 MED ORDER — AMBULATORY NON FORMULARY MEDICATION: 1.0000 [IU] | Freq: Once | 0 refills | Status: AC

## 2022-04-06 MED ORDER — MELOXICAM 15 MG PO TABS: 15.0000 mg | ORAL_TABLET | Freq: Every day | ORAL | 0 refills | Status: DC

## 2022-04-06 NOTE — Addendum Note (Signed)
Addended by: Carrolyn Leigh on: 04/06/2022 08:44 AM   Modules accepted: Orders

## 2022-04-06 NOTE — Progress Notes (Unsigned)
Meloxicam placed

## 2022-04-06 NOTE — Progress Notes (Signed)
Knee pain m25.562 G89.29

## 2022-04-11 NOTE — Progress Notes (Signed)
Aleen Sells D.[facility] [address] [place] [address] Phone: [phone]   Assessment and Plan:     1. Acute pain of left knee 2. Closed nondisplaced comminuted fracture of left patella, initial encounter  -Acute, complicated, subsequent visit - Patient returns today for reevaluation and repeat x-ray after findings concerning for nondisplaced comminuted patellar fracture at previous office visit on 04/05/2022 - X-rays repeated today with similar findings.  No displacement.  Similar areas of cortical disruption seen most clearly on sunrise and lateral views - With patient having nondisplaced patellar fracture, we will start with conservative management including nonweightbearing and immobilization in full extension - Hinged knee brace provided to patient today and locked at 0 to 10 degrees.  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  They will provide [place] with short-term disability that we can fill out.  I expect several months, minimum 2-3 before he can perform these activities again. - Patient may perform isometric quadriceps and hamstrings activation with knee immobilized.  Patient will eventually require physical therapy as we expect some degree of muscle atrophy during this immobilization.  Pertinent previous records reviewed include left knee x-ray 04/05/2022   Follow Up: 3 weeks for reevaluation.  Would repeat left knee x-ray at that time to ensure no further displacement.  If patient is overall improving and early signs of healing are in place, we could consider partial weightbearing if pain-free with knee still immobilized.   Subjective:   I, Jerene Canny, am serving as a Neurosurgeon for Doctor Richardean Sale    Chief Complaint: left knee pain    HPI:    04/05/22 Patient is a 72 year old male complaining of left knee pain. Patient states he fell today he has pain with extension and he has pain when walking he notes some swelling at the top , no numbness or tingling, no radiating pain when he flexes his foot he has some tightness in his knee,he states his knee has been "popping left or right' but no pain he states he has an "unstable knee"   Is noting some lightheadness and dizziness but states he is taking new blood pressure meds and is on predisone dos pak    04/12/2022 Patient states that he has swelling not as much , but pain has gotten better, knee brace immobilizer    Relevant Historical Information:   Hypertension, history of left MCL injury in 2019  Additional pertinent review of systems negative.   Current Outpatient Medications:    amoxicillin-clavulanate (AUGMENTIN) 875-125 MG tablet, Take 1 tablet by mouth 2 (two) times daily for 5 days., Disp: 10 tablet, Rfl: 0   aspirin EC 81 MG tablet, Take 81 mg by mouth daily. Swallow whole., Disp: , Rfl:    atorvastatin (LIPITOR) 40 MG tablet, TAKE 1 TABLET BY MOUTH EVERY DAY, Disp: 90 tablet, Rfl: 3   azelastine (ASTELIN) 0.1 % nasal spray, Place 1 spray into both nostrils 2 (two) times daily. Use in each nostril as directed, Disp: 30 mL, Rfl: 12   Colostrum 500 MG CAPS, Take 500 mg by mouth daily., Disp: , Rfl:    fluticasone (FLONASE) 50 MCG/ACT nasal spray, Place 2 sprays into both nostrils daily., Disp: 16 mL, Rfl: 6  hydrochlorothiazide (HYDRODIURIL) 25 MG tablet, Take 1 tablet (25 mg total) by mouth daily., Disp: 30 tablet, Rfl: 3   meloxicam (MOBIC) 15 MG tablet, Take 1 tablet (15 mg total) by mouth daily., Disp: 30 tablet, Rfl: 0   Misc Natural Products (SUPER GREENS PO), Take 1 Scoop by mouth daily. , Disp: , Rfl:    telmisartan (MICARDIS) 80 MG tablet, Take 1 tablet (80 mg total) by mouth daily., Disp: 90 tablet, Rfl: 3   Objective:      Vitals:   04/12/22 1035  BP: 132/80  Pulse: 80  SpO2: 99%  Weight: 201 lb (91.2 kg)  Height: 6\' 2"  (1.88 m)      Body mass index is 25.81 kg/m.    Physical Exam:    General:  awake, alert oriented, no acute distress nontoxic Skin: no suspicious lesions or rashes Neuro:sensation intact and strength 5/5 with no deficits, no atrophy, normal muscle tone Psych: No signs of anxiety, depression or other mood disorder   Left knee: Moderate swelling No deformity Positive fluid wave, joint milking Nearly healed abrasion superficial to patella TTP patella NTTP over the quad tendon, medial fem condyle, lat fem condyle, patella, plica, patella tendon, tibial tuberostiy, fibular head, posterior fossa, pes anserine bursa, gerdy's tubercle, medial jt line, lateral jt line   Gait abnormal, nonweightbearing using crutches    Electronically signed by:  Aleen Sells D.[facility] 11:05 AM 04/12/22

## 2022-04-12 ENCOUNTER — Ambulatory Visit (INDEPENDENT_AMBULATORY_CARE_PROVIDER_SITE_OTHER): Payer: Medicare HMO

## 2022-04-12 ENCOUNTER — Ambulatory Visit: Payer: Medicare HMO | Admitting: Sports Medicine

## 2022-04-12 VITALS — BP 132/80 | HR 80 | Ht 74.0 in | Wt 201.0 lb

## 2022-04-12 DIAGNOSIS — S82045A Nondisplaced comminuted fracture of left patella, initial encounter for closed fracture: Secondary | ICD-10-CM

## 2022-04-12 DIAGNOSIS — M25562 Pain in left knee: Secondary | ICD-10-CM

## 2022-04-12 NOTE — Patient Instructions (Addendum)
Good to see you  In brace at all times for 3 weeks  Can do quad and hamstring in brace  3 week follow up

## 2022-04-16 ENCOUNTER — Ambulatory Visit: Payer: Medicare HMO | Admitting: Internal Medicine

## 2022-04-16 NOTE — Telephone Encounter (Signed)
Form amended and emailed to patient

## 2022-04-16 NOTE — Telephone Encounter (Signed)
Patient called about this message. I told him that we should have a copy of the form that can be corrected. He would like it emailed to him once completed.

## 2022-04-18 ENCOUNTER — Ambulatory Visit: Payer: Medicare HMO | Admitting: Internal Medicine

## 2022-04-19 ENCOUNTER — Encounter: Payer: Self-pay | Admitting: Internal Medicine

## 2022-04-19 NOTE — Progress Notes (Unsigned)
      Subjective:    Patient ID: Charles Tate, male    DOB: [birth year], 72 y.o.   MRN: [id number]     HPI Charles Tate is here for follow up of his chronic medical problems, including htn  Bp started going up with sinus inf -- urgent care - 5 days prednisone.  3rd day - energetic - fell - broke knee cap  Still doesnot feel good - PND, ears feel tight  BP at home 149/90  BP elevated after seen here 1/15 for sinus infection.  Medications and allergies reviewed with patient and updated if appropriate.  Current Outpatient Medications on File Prior to Visit  Medication Sig Dispense Refill   aspirin EC 81 MG tablet Take 81 mg by mouth daily. Swallow whole.     atorvastatin (LIPITOR) 40 MG tablet TAKE 1 TABLET BY MOUTH EVERY DAY 90 tablet 3   azelastine (ASTELIN) 0.1 % nasal spray Place 1 spray into both nostrils 2 (two) times daily. Use in each nostril as directed 30 mL 12   Colostrum 500 MG CAPS Take 500 mg by mouth daily.     fluticasone (FLONASE) 50 MCG/ACT nasal spray Place 2 sprays into both nostrils daily. 16 mL 6   hydrochlorothiazide (HYDRODIURIL) 25 MG tablet Take 1 tablet (25 mg total) by mouth daily. 30 tablet 3   meloxicam (MOBIC) 15 MG tablet Take 1 tablet (15 mg total) by mouth daily. 30 tablet 0   Misc Natural Products (SUPER GREENS PO) Take 1 Scoop by mouth daily.      telmisartan (MICARDIS) 80 MG tablet Take 1 tablet (80 mg total) by mouth daily. 90 tablet 3   No current facility-administered medications on file prior to visit.     Review of Systems  Constitutional:  Negative for chills and fever.  HENT:  Positive for sore throat (at night - mouth brething at night). Negative for congestion, ear pain, sinus pressure and sinus pain.        Ears popping, pressure in upper neck  Respiratory:  Negative for cough.   Neurological:  Negative for dizziness, light-headedness and headaches.       Objective:   Vitals:   04/20/22 1407  BP: 118/62  Pulse: 78  Temp:  98.3 F (36.8 C)  SpO2: 97%   BP Readings from Last 3 Encounters:  04/20/22 118/62  04/12/22 132/80  04/05/22 (!) 140/80   Wt Readings from Last 3 Encounters:  04/12/22 201 lb (91.2 kg)  04/05/22 201 lb (91.2 kg)  03/19/22 201 lb (91.2 kg)   Body mass index is 25.81 kg/m.    Physical Exam     Lab Results  Component Value Date   WBC 3.6 (L) 02/12/2022   HGB 14.8 02/12/2022   HCT 43.4 02/12/2022   PLT 189.0 02/12/2022   GLUCOSE 92 02/12/2022   CHOL 128 02/12/2022   TRIG 97.0 02/12/2022   HDL 36.80 (L) 02/12/2022   LDLDIRECT 161.0 12/05/2018   LDLCALC 72 02/12/2022   ALT 30 02/12/2022   AST 29 02/12/2022   NA 139 02/12/2022   K 4.2 02/12/2022   CL 101 02/12/2022   CREATININE 0.79 02/12/2022   BUN 10 02/12/2022   CO2 30 02/12/2022   TSH 1.37 02/12/2022   PSA 3.90 02/12/2022   INR 1.1 03/15/2020   HGBA1C 6.1 02/12/2022     Assessment & Plan:    See Problem List for Assessment and Plan of chronic medical problems.

## 2022-04-19 NOTE — Patient Instructions (Addendum)
    Monitor your BP at home   Medications changes include :  augmentin x 5 days, use the flonase.       Return if symptoms worsen or fail to improve.

## 2022-04-20 ENCOUNTER — Encounter: Payer: Self-pay | Admitting: Internal Medicine

## 2022-04-20 ENCOUNTER — Ambulatory Visit (INDEPENDENT_AMBULATORY_CARE_PROVIDER_SITE_OTHER): Payer: Medicare HMO | Admitting: Internal Medicine

## 2022-04-20 VITALS — BP 118/62 | HR 78 | Temp 98.3°F | Ht 74.0 in

## 2022-04-20 DIAGNOSIS — J0100 Acute maxillary sinusitis, unspecified: Secondary | ICD-10-CM

## 2022-04-20 DIAGNOSIS — I10 Essential (primary) hypertension: Secondary | ICD-10-CM | POA: Diagnosis not present

## 2022-04-20 MED ORDER — AMOXICILLIN-POT CLAVULANATE 875-125 MG PO TABS: 1.0000 | ORAL_TABLET | Freq: Two times a day (BID) | ORAL | 0 refills | Status: AC

## 2022-04-21 NOTE — Assessment & Plan Note (Signed)
Subacute Partially treated Augmentin 875-125 mg bid x 5 days Use flonase daily to help with ears Send message if not improving

## 2022-04-21 NOTE — Assessment & Plan Note (Signed)
Chronic BP well controlled here today Continue hctz 25 mg daily, telmisartan 80 mg daily Monitor BP at home - ? Controlled or variable at home - may nned to adjust meds in future

## 2022-05-02 NOTE — Progress Notes (Unsigned)
    Benito Mccreedy D.[place] Phone: [phone]   Assessment and Plan:     There are no diagnoses linked to this encounter.  ***   Pertinent previous records reviewed include ***   Follow Up: ***     Subjective:   I, Lonni Dirden, am serving as a Education administrator for [name]   Chief Complaint: left knee pain    HPI:    04/05/22 Patient is a 72 year old male complaining of left knee pain. Patient states he fell today he has pain with extension and he has pain when walking he notes some swelling at the top , no numbness or tingling, no radiating pain when he flexes his foot he has some tightness in his knee,he states his knee has been "popping left or right' but no pain he states he has an "unstable knee"   Is noting some lightheadness and dizziness but states he is taking new blood pressure meds and is on predisone dos pak    04/12/2022 Patient states that he has swelling not as much , but pain has gotten better, knee brace immobilizer    05/03/2022 Patient states    Relevant Historical Information:   Hypertension, history of left MCL injury in 2019  Additional pertinent review of systems negative.   Current Outpatient Medications:    aspirin EC 81 MG tablet, Take 81 mg by mouth daily. Swallow whole., Disp: , Rfl:    atorvastatin (LIPITOR) 40 MG tablet, TAKE 1 TABLET BY MOUTH EVERY DAY, Disp: 90 tablet, Rfl: 3   azelastine (ASTELIN) 0.1 % nasal spray, Place 1 spray into both nostrils 2 (two) times daily. Use in each nostril as directed, Disp: 30 mL, Rfl: 12   Colostrum 500 MG CAPS, Take 500 mg by mouth daily., Disp: , Rfl:    fluticasone (FLONASE) 50 MCG/ACT nasal spray, Place 2 sprays into both nostrils daily., Disp: 16 mL, Rfl: 6   hydrochlorothiazide (HYDRODIURIL) 25 MG tablet, Take 1 tablet (25 mg total) by mouth daily., Disp: 30 tablet, Rfl: 3   meloxicam (MOBIC) 15 MG tablet, Take 1  tablet (15 mg total) by mouth daily., Disp: 30 tablet, Rfl: 0   Misc Natural Products (SUPER GREENS PO), Take 1 Scoop by mouth daily. , Disp: , Rfl:    telmisartan (MICARDIS) 80 MG tablet, Take 1 tablet (80 mg total) by mouth daily., Disp: 90 tablet, Rfl: 3   Objective:     There were no vitals filed for this visit.    There is no height or weight on file to calculate BMI.    Physical Exam:    ***   Electronically signed by:  Benito Mccreedy D.[facility] 12:09 PM 05/02/22

## 2022-05-03 ENCOUNTER — Ambulatory Visit: Payer: Medicare HMO | Admitting: Sports Medicine

## 2022-05-03 ENCOUNTER — Ambulatory Visit (INDEPENDENT_AMBULATORY_CARE_PROVIDER_SITE_OTHER): Payer: Medicare HMO

## 2022-05-03 VITALS — BP 120/68 | HR 84 | Ht 74.0 in | Wt 201.0 lb

## 2022-05-03 DIAGNOSIS — S82002A Unspecified fracture of left patella, initial encounter for closed fracture: Secondary | ICD-10-CM | POA: Diagnosis not present

## 2022-05-03 DIAGNOSIS — M25562 Pain in left knee: Secondary | ICD-10-CM

## 2022-05-03 DIAGNOSIS — S82045D Nondisplaced comminuted fracture of left patella, subsequent encounter for closed fracture with routine healing: Secondary | ICD-10-CM | POA: Diagnosis not present

## 2022-05-03 NOTE — Patient Instructions (Addendum)
Good to see you  Partial weight bearing as tolerated , if pain free Continue crutches And hinged knee brace 3 week follow up

## 2022-05-15 NOTE — Progress Notes (Deleted)
    Charles Tate D.[place] Phone: [phone]   Assessment and Plan:     There are no diagnoses linked to this encounter.  ***   Pertinent previous records reviewed include ***   Follow Up: ***     Subjective:   I, Charles Tate, am serving as a Education administrator for Charles Tate   Chief Complaint: left knee pain    HPI:    04/05/22 Patient is a 72 year old male complaining of left knee pain. Patient states he fell today he has pain with extension and he has pain when walking he notes some swelling at the top , no numbness or tingling, no radiating pain when he flexes his foot he has some tightness in his knee,he states his knee has been "popping left or right' but no pain he states he has an "unstable knee"   Is noting some lightheadness and dizziness but states he is taking new blood pressure meds and is on predisone dos pak    04/12/2022 Patient states that he has swelling not as much , but pain has gotten better, knee brace immobilizer    05/03/2022 Patient states he still has some swelling, no pain just tender and tight    05/22/2022 Patient states    Relevant Historical Information:   Hypertension, history of left MCL injury in 2019  Additional pertinent review of systems negative.   Current Outpatient Medications:    aspirin EC 81 MG tablet, Take 81 mg by mouth daily. Swallow whole., Disp: , Rfl:    atorvastatin (LIPITOR) 40 MG tablet, TAKE 1 TABLET BY MOUTH EVERY DAY, Disp: 90 tablet, Rfl: 3   azelastine (ASTELIN) 0.1 % nasal spray, Place 1 spray into both nostrils 2 (two) times daily. Use in each nostril as directed, Disp: 30 mL, Rfl: 12   Colostrum 500 MG CAPS, Take 500 mg by mouth daily., Disp: , Rfl:    fluticasone (FLONASE) 50 MCG/ACT nasal spray, Place 2 sprays into both nostrils daily., Disp: 16 mL, Rfl: 6   hydrochlorothiazide (HYDRODIURIL) 25 MG tablet, Take 1 tablet (25 mg  total) by mouth daily., Disp: 30 tablet, Rfl: 3   meloxicam (MOBIC) 15 MG tablet, Take 1 tablet (15 mg total) by mouth daily., Disp: 30 tablet, Rfl: 0   Misc Natural Products (SUPER GREENS PO), Take 1 Scoop by mouth daily. , Disp: , Rfl:    telmisartan (MICARDIS) 80 MG tablet, Take 1 tablet (80 mg total) by mouth daily., Disp: 90 tablet, Rfl: 3   Objective:     There were no vitals filed for this visit.    There is no height or weight on file to calculate BMI.    Physical Exam:    ***   Electronically signed by:  Charles Tate D.[facility] 9:12 AM 05/15/22

## 2022-05-19 ENCOUNTER — Emergency Department (HOSPITAL_COMMUNITY): Payer: Medicare HMO

## 2022-05-19 ENCOUNTER — Encounter (HOSPITAL_COMMUNITY): Payer: Self-pay

## 2022-05-19 ENCOUNTER — Emergency Department (HOSPITAL_COMMUNITY)
Admission: EM | Admit: 2022-05-19 | Discharge: 2022-05-19 | Disposition: A | Discharge status: Home / Self Care | Payer: Medicare HMO | Attending: Emergency Medicine | Admitting: Emergency Medicine

## 2022-05-19 DIAGNOSIS — S022XXA Fracture of nasal bones, initial encounter for closed fracture: Secondary | ICD-10-CM | POA: Diagnosis not present

## 2022-05-19 DIAGNOSIS — W19XXXA Unspecified fall, initial encounter: Secondary | ICD-10-CM | POA: Insufficient documentation

## 2022-05-19 DIAGNOSIS — S0990XA Unspecified injury of head, initial encounter: Secondary | ICD-10-CM | POA: Diagnosis not present

## 2022-05-19 DIAGNOSIS — S0992XA Unspecified injury of nose, initial encounter: Secondary | ICD-10-CM | POA: Diagnosis not present

## 2022-05-19 DIAGNOSIS — R55 Syncope and collapse: Secondary | ICD-10-CM | POA: Diagnosis not present

## 2022-05-19 DIAGNOSIS — R42 Dizziness and giddiness: Secondary | ICD-10-CM | POA: Diagnosis not present

## 2022-05-19 DIAGNOSIS — Z743 Need for continuous supervision: Secondary | ICD-10-CM | POA: Diagnosis not present

## 2022-05-19 DIAGNOSIS — T1490XA Injury, unspecified, initial encounter: Secondary | ICD-10-CM | POA: Diagnosis not present

## 2022-05-19 DIAGNOSIS — I959 Hypotension, unspecified: Secondary | ICD-10-CM | POA: Diagnosis not present

## 2022-05-19 DIAGNOSIS — Z7982 Long term (current) use of aspirin: Secondary | ICD-10-CM | POA: Insufficient documentation

## 2022-05-19 DIAGNOSIS — Z043 Encounter for examination and observation following other accident: Secondary | ICD-10-CM | POA: Diagnosis not present

## 2022-05-19 DIAGNOSIS — M25462 Effusion, left knee: Secondary | ICD-10-CM | POA: Diagnosis not present

## 2022-05-19 LAB — CBC
HCT: 40.9 % (ref 39.0–52.0)
Hemoglobin: 14 g/dL (ref 13.0–17.0)
MCH: 30.4 pg (ref 26.0–34.0)
MCHC: 34.2 g/dL (ref 30.0–36.0)
MCV: 88.7 fL (ref 80.0–100.0)
Platelets: 167 10*3/uL (ref 150–400)
RBC: 4.61 MIL/uL (ref 4.22–5.81)
RDW: 12.3 % (ref 11.5–15.5)
WBC: 8.8 10*3/uL (ref 4.0–10.5)
nRBC: 0 % (ref 0.0–0.2)

## 2022-05-19 LAB — BASIC METABOLIC PANEL
Anion gap: 8 (ref 5–15)
BUN: 17 mg/dL (ref 8–23)
CO2: 26 mmol/L (ref 22–32)
Calcium: 8.9 mg/dL (ref 8.9–10.3)
Chloride: 101 mmol/L (ref 98–111)
Creatinine, Ser: 0.91 mg/dL (ref 0.61–1.24)
GFR, Estimated: >60 mL/min (ref >60)
Glucose, Bld: 121 mg/dL — ABNORMAL HIGH (ref 70–99)
Potassium: 3.7 mmol/L (ref 3.5–5.1)
Sodium: 135 mmol/L (ref 135–145)

## 2022-05-19 LAB — CBG MONITORING, ED: Glucose-Capillary: 129 mg/dL — ABNORMAL HIGH (ref 70–99)

## 2022-05-19 MED ORDER — ACETAMINOPHEN 500 MG PO TABS
1000.0000 mg | ORAL_TABLET | Freq: Once | ORAL | Status: AC
  Administered 2022-05-19: 1000 mg via ORAL
  Filled 2022-05-19: qty 2

## 2022-05-19 MED ORDER — SODIUM CHLORIDE 0.9 % IV BOLUS
1000.0000 mL | Freq: Once | INTRAVENOUS | Status: AC
  Administered 2022-05-19: 1000 mL via INTRAVENOUS

## 2022-05-19 NOTE — ED Triage Notes (Signed)
Pt bib EMS coming from store. Pt recalls walking around the store and feeling lightheaded, dizzy, sweaty then passed out. Pt states he has not had much to eat/drink today. Reports pain to posterior neck and shoulders. Multiple lacerations and abrasions noted to face.  States about 1.5 month ago he had recent changes to BP meds and has been occasionally dizzy. [place] last month and fractured left patella.

## 2022-05-19 NOTE — Discharge Instructions (Signed)
As discussed, today's evaluation has been generally reassuring.  There is some suspicion that your medications may be contributing to your episodes of lightheadedness and losing consciousness.  Your medications have been adjusted today, and please discuss this with your physician this week at a follow-up visit. The most notable consequence of your fall today was fracture of the nasal bones.  These are not displaced, and should heal without surgical intervention, but it is importantly follow-up with our ENT colleagues for appropriate ongoing outpatient management.  Return here for concerning changes in your condition.

## 2022-05-19 NOTE — ED Provider Notes (Signed)
[facility] Provider Note   CSN: [id number] Arrival date & time: 05/19/22  1357     History  Chief Complaint  Patient presents with   Fall   Loss of Consciousness    Charles Tate is a 72 y.o. male.  HPI Patient presents with his son who assists with the history.  Patient has had multiple episodes of prodromal syncope, has been seen by his physician, has had adjustments of his antihypertensives.  His most recent episode was last month, resulting in a fall in which he broke his left knee.  Today he notes he did not have a typical amount of food intake, had an episode of lightheadedness while upright, fell, no chest pain before or after the event.  Since the event has had pain in his head, neck, soreness in his jaw.  No confusion, disorientation, no broken teeth.  Son notes the patient is interacting in a typical manner currently.    Home Medications Prior to Admission medications   Medication Sig Start Date End Date Taking? Authorizing Provider  aspirin EC 81 MG tablet Take 81 mg by mouth daily. Swallow whole.    [provider]  atorvastatin (LIPITOR) 40 MG tablet TAKE 1 TABLET BY MOUTH EVERY DAY 01/15/22   Burns, Claudina Lick, MD  azelastine (ASTELIN) 0.1 % nasal spray Place 1 spray into both nostrils 2 (two) times daily. Use in each nostril as directed 05/30/21   Maximiano Coss, NP  Colostrum 500 MG CAPS Take 500 mg by mouth daily.    [provider]  fluticasone (FLONASE) 50 MCG/ACT nasal spray Place 2 sprays into both nostrils daily. 03/01/22   [name], MD  hydrochlorothiazide (HYDRODIURIL) 25 MG tablet Take 1 tablet (25 mg total) by mouth daily. 03/30/22   [name], MD  meloxicam (MOBIC) 15 MG tablet Take 1 tablet (15 mg total) by mouth daily. 04/06/22   Glennon Mac, DO  Misc Natural Products (SUPER GREENS PO) Take 1 Scoop by mouth daily.     [provider]  telmisartan (MICARDIS) 80 MG tablet  Take 1 tablet (80 mg total) by mouth daily. 02/12/22   [name], MD      Allergies    Ciprofloxacin hcl, Cefdinir, Tamsulosin, and Lisinopril    Review of Systems   Review of Systems  All other systems reviewed and are negative.   Physical Exam Updated Vital Signs BP (!) 142/66   Pulse 74   Temp 97.8 F (36.6 C) (Oral)   Resp 18   Ht 6\' 2"  (1.88 m)   Wt 91.1 kg   SpO2 100%   BMI 25.79 kg/m  Physical Exam Vitals and nursing note reviewed.  Constitutional:      General: He is not in acute distress.    Appearance: He is well-developed.  HENT:     Head: Normocephalic.   Eyes:     Conjunctiva/sclera: Conjunctivae normal.  Neck:     Comments: Cervical collar in place no deformities Cardiovascular:     Rate and Rhythm: Normal rate and regular rhythm.  Pulmonary:     Effort: Pulmonary effort is normal. No respiratory distress.     Breath sounds: No stridor.  Abdominal:     General: There is no distension.  Skin:    General: Skin is warm and dry.  Neurological:     Mental Status: He is alert and oriented to person, place, and time.  ED Results / Procedures / Treatments   Labs (all labs ordered are listed, but only abnormal results are displayed) Labs Reviewed  BASIC METABOLIC PANEL - Abnormal; Notable for the following components:      Result Value   Glucose, Bld 121 (*)    All other components within normal limits  CBG MONITORING, ED - Abnormal; Notable for the following components:   Glucose-Capillary 129 (*)    All other components within normal limits  CBC    EKG EKG Interpretation  Date/Time:  Saturday May 19 2022 14:05:19 EDT Ventricular Rate:  64 PR Interval:  198 QRS Duration: 89 QT Interval:  416 QTC Calculation: 430 R Axis:   76 Text Interpretation: Sinus rhythm RSR' in V1 or V2, probably normal variant Otherwise within normal limits Confirmed by Carmin Muskrat [id number]) on 05/19/2022 4:03:41 PM  Radiology DG Knee AP/LAT W/[place]  Left  Result Date: 05/19/2022 CLINICAL DATA:  Fibular fracture in February.  Recent fall. EXAM: LEFT KNEE 3 VIEWS COMPARISON:  Knee radiograph 05/03/2022 FINDINGS: Again demonstrated partially healed patellar fracture. No evidence of new fracture. Patella is located. Small suprapatellar joint effusion. IMPRESSION: 1. No evidence of acute fracture or dislocation 2. Small suprapatellar joint effusion. 3. Remote patellar fracture. Electronically Signed   By: Suzy Bouchard M.D.   On: 05/19/2022 17:08   CT Head Wo Contrast  Result Date: 05/19/2022 CLINICAL DATA:  Polytrauma EXAM: CT HEAD WITHOUT CONTRAST CT MAXILLOFACIAL WITHOUT CONTRAST CT CERVICAL SPINE WITHOUT CONTRAST TECHNIQUE: Multidetector CT imaging of the head, cervical spine, and maxillofacial structures were performed using the standard protocol without intravenous contrast. Multiplanar CT image reconstructions of the cervical spine and maxillofacial structures were also generated. RADIATION DOSE REDUCTION: This exam was performed according to the departmental dose-optimization program which includes automated exposure control, adjustment of the mA and/or kV according to patient size and/or use of iterative reconstruction technique. COMPARISON:  None Available. FINDINGS: CT HEAD FINDINGS Brain: No evidence of acute infarction, hemorrhage, hydrocephalus, extra-axial collection or mass lesion/mass effect. Vascular: No hyperdense vessel or unexpected calcification. CT FACIAL BONES FINDINGS Skull: Normal. Negative for fracture or focal lesion. Facial bones: Suspect nondisplaced fractures of the nasal bones (series 5, image 62). No displaced fractures or dislocations. Sinuses/Orbits: No acute finding. Other: None. CT CERVICAL SPINE FINDINGS Alignment: Normal. Skull base and vertebrae: No acute fracture. No primary bone lesion or focal pathologic process. Soft tissues and spinal canal: No prevertebral fluid or swelling. No visible canal hematoma. Disc  levels: Mild anterior bridging osteophytosis of the lower cervical levels as well as mild disc space height loss and osteophytosis of C3-C4, otherwise intact. Upper chest: Negative. Other: None. IMPRESSION: 1. No acute intracranial pathology. 2. Suspect nondisplaced fractures of the nasal bones. No displaced fractures or dislocations of the facial bones. 3. No fracture or static subluxation of the cervical spine. Electronically Signed   By: Delanna Ahmadi M.D.   On: 05/19/2022 16:28   CT Cervical Spine Wo Contrast  Result Date: 05/19/2022 CLINICAL DATA:  Polytrauma EXAM: CT HEAD WITHOUT CONTRAST CT MAXILLOFACIAL WITHOUT CONTRAST CT CERVICAL SPINE WITHOUT CONTRAST TECHNIQUE: Multidetector CT imaging of the head, cervical spine, and maxillofacial structures were performed using the standard protocol without intravenous contrast. Multiplanar CT image reconstructions of the cervical spine and maxillofacial structures were also generated. RADIATION DOSE REDUCTION: This exam was performed according to the departmental dose-optimization program which includes automated exposure control, adjustment of the mA and/or kV according to patient size and/or use of iterative reconstruction  technique. COMPARISON:  None Available. FINDINGS: CT HEAD FINDINGS Brain: No evidence of acute infarction, hemorrhage, hydrocephalus, extra-axial collection or mass lesion/mass effect. Vascular: No hyperdense vessel or unexpected calcification. CT FACIAL BONES FINDINGS Skull: Normal. Negative for fracture or focal lesion. Facial bones: Suspect nondisplaced fractures of the nasal bones (series 5, image 62). No displaced fractures or dislocations. Sinuses/Orbits: No acute finding. Other: None. CT CERVICAL SPINE FINDINGS Alignment: Normal. Skull base and vertebrae: No acute fracture. No primary bone lesion or focal pathologic process. Soft tissues and spinal canal: No prevertebral fluid or swelling. No visible canal hematoma. Disc levels: Mild  anterior bridging osteophytosis of the lower cervical levels as well as mild disc space height loss and osteophytosis of C3-C4, otherwise intact. Upper chest: Negative. Other: None. IMPRESSION: 1. No acute intracranial pathology. 2. Suspect nondisplaced fractures of the nasal bones. No displaced fractures or dislocations of the facial bones. 3. No fracture or static subluxation of the cervical spine. Electronically Signed   By: Delanna Ahmadi M.D.   On: 05/19/2022 16:28   CT Maxillofacial WO CM  Result Date: 05/19/2022 CLINICAL DATA:  Polytrauma EXAM: CT HEAD WITHOUT CONTRAST CT MAXILLOFACIAL WITHOUT CONTRAST CT CERVICAL SPINE WITHOUT CONTRAST TECHNIQUE: Multidetector CT imaging of the head, cervical spine, and maxillofacial structures were performed using the standard protocol without intravenous contrast. Multiplanar CT image reconstructions of the cervical spine and maxillofacial structures were also generated. RADIATION DOSE REDUCTION: This exam was performed according to the departmental dose-optimization program which includes automated exposure control, adjustment of the mA and/or kV according to patient size and/or use of iterative reconstruction technique. COMPARISON:  None Available. FINDINGS: CT HEAD FINDINGS Brain: No evidence of acute infarction, hemorrhage, hydrocephalus, extra-axial collection or mass lesion/mass effect. Vascular: No hyperdense vessel or unexpected calcification. CT FACIAL BONES FINDINGS Skull: Normal. Negative for fracture or focal lesion. Facial bones: Suspect nondisplaced fractures of the nasal bones (series 5, image 62). No displaced fractures or dislocations. Sinuses/Orbits: No acute finding. Other: None. CT CERVICAL SPINE FINDINGS Alignment: Normal. Skull base and vertebrae: No acute fracture. No primary bone lesion or focal pathologic process. Soft tissues and spinal canal: No prevertebral fluid or swelling. No visible canal hematoma. Disc levels: Mild anterior bridging  osteophytosis of the lower cervical levels as well as mild disc space height loss and osteophytosis of C3-C4, otherwise intact. Upper chest: Negative. Other: None. IMPRESSION: 1. No acute intracranial pathology. 2. Suspect nondisplaced fractures of the nasal bones. No displaced fractures or dislocations of the facial bones. 3. No fracture or static subluxation of the cervical spine. Electronically Signed   By: Delanna Ahmadi M.D.   On: 05/19/2022 16:28    Procedures Procedures    Medications Ordered in ED Medications  sodium chloride 0.9 % bolus 1,000 mL (0 mLs Intravenous Stopped 05/19/22 1830)  acetaminophen (TYLENOL) tablet 1,000 mg (1,000 mg Oral Given 05/19/22 1634)    ED Course/ Medical Decision Making/ A&P                             Medical Decision Making Adult male with history of prior prodromal syncope attributed to orthostatic hypotension presents after episode of syncope, fall with facial trauma, head pain, in the context of fall 1 month ago with knee fracture.  Given the patient's pain CT scans, x-rays were ordered, labs started, monitoring initiated. Concern for arrhythmia versus iatrogenic hypotension versus vagal or other etiology for syncope.  Cardiac 65 sinus normal Pulse  ox 100% room air normal   Amount and/or Complexity of Data Reviewed Independent Historian: caregiver External Data Reviewed: notes. Labs: ordered. Decision-making details documented in ED Course. Radiology: ordered and independent interpretation performed. Decision-making details documented in ED Course. ECG/medicine tests: ordered and independent interpretation performed. Decision-making details documented in ED Course.  Risk OTC drugs. Prescription drug management. Decision regarding hospitalization.   6:55 PM No wounds needing repair.  Patient has been upright without substantial drop in his blood pressure.  I reviewed all findings, CT, x-ray, reviewed them with the patient and his son.  No  evidence for acute intracranial phenomena, no substantial hypotension when upright, though there is some suspicion for iatrogenic hypotension contributing to the patient's episodes of his syncope. No evidence for arrhythmia, infection or other acute abnormalities. Patient's medications have been adjusted, he is approved for discharge with outpatient follow-up primary care and ENT.        Final Clinical Impression(s) / ED Diagnoses Final diagnoses:  Syncope and collapse  Injury of head, initial encounter  Closed fracture of nasal bone, initial encounter    Rx / DC Orders ED Discharge Orders     None         Carmin Muskrat, MD 05/19/22 [phone]

## 2022-05-21 ENCOUNTER — Telehealth: Payer: Self-pay | Admitting: Internal Medicine

## 2022-05-21 NOTE — Telephone Encounter (Signed)
Patient called and said he was seen for BP issues at the hospital. He was told to schedule a hospital follow up, but he said he would like a call back at [phone]. He said he would not like to come in if possible when encouraged to take an open HFU slot on Wednesday 05/23/22.

## 2022-05-22 ENCOUNTER — Ambulatory Visit: Payer: Medicare HMO | Admitting: Sports Medicine

## 2022-05-22 VITALS — BP 138/82 | HR 66 | Ht 74.0 in | Wt 200.0 lb

## 2022-05-22 DIAGNOSIS — S82045D Nondisplaced comminuted fracture of left patella, subsequent encounter for closed fracture with routine healing: Secondary | ICD-10-CM

## 2022-05-22 DIAGNOSIS — M25562 Pain in left knee: Secondary | ICD-10-CM

## 2022-05-22 NOTE — Telephone Encounter (Signed)
Called Pt and left voicemail to give office a callback.

## 2022-05-22 NOTE — Patient Instructions (Addendum)
Good to see you PT referral  Weight bearing as tolerated  Continue using hinge knee brace until re-evaluated Neck and trap HEP  3 week follow up

## 2022-05-22 NOTE — Progress Notes (Signed)
Charles Tate D.[facility] [place] Phone: [phone]   Assessment and Plan:     1. Acute pain of left knee 2. Closed nondisplaced comminuted fracture of left patella with routine healing, subsequent encounter -Subacute, improving, complicated, subsequent visit - Interval healing of comminuted fracture of left patella based on HPI, physical exam, x-ray images from ER visit on 05/19/2022 after repeat fall - Patient is 6-7 weeks out from fall which caused comminuted left patella fracture.  He has no tenderness to palpation of patella and no pain with weightbearing with knee and hinged knee brace - May advance to weightbearing as tolerated.  Recommend continuing to use hinged knee brace - Start physical therapy to regain range of motion and gentle strengthening - With patient having a repeat syncopal event, recommend continuing with PCP to discuss medication adjustments.  Patient being on a thiazide diuretic and an alpha-blocker may have contributed to syncopal episode    Pertinent previous records reviewed include ER note 05/19/2022, left knee x-ray 05/19/2022, CT cervical spine, head, maxillofacial 05/19/2022   Follow Up: 3 weeks for reevaluation.  Would repeat left knee x-ray to look for continued healing   Subjective:   I, Charles Tate, am serving as a Education administrator for Charles Tate   Chief Complaint: left knee pain    HPI:    04/05/22 Patient is a 72 year old male complaining of left knee pain. Patient states he fell today he has pain with extension and he has pain when walking he notes some swelling at the top , no numbness or tingling, no radiating pain when he flexes his foot he has some tightness in his knee,he states his knee has been "popping left or right' but no pain he states he has an "unstable knee"   Is noting some lightheadness and dizziness but states he is taking new blood pressure meds and is on  predisone dos pak    04/12/2022 Patient states that he has swelling not as much , but pain has gotten better, knee brace immobilizer    05/03/2022 Patient states he still has some swelling, no pain just tender and tight   05/19/2022 ED Patient presents with his son who assists with the history. Patient has had multiple episodes of prodromal syncope, has been seen by his physician, has had adjustments of his antihypertensives. His most recent episode was last month, resulting in a fall in which he broke his left knee. Today he notes he did not have a typical amount of food intake, had an episode of lightheadedness while upright, fell, no chest pain before or after the event. Since the event has had pain in his head, neck, soreness in his jaw. No confusion, disorientation, no broken teeth. Son notes the patient is interacting in a typical manner currently.    05/22/2022 Patient states knee feels great, had a syncope episode was standing for a longtime , broken nose, decreased ROM neck , thinks the falling could be a result in changes in medicine      Relevant Historical Information:   Hypertension, history of left MCL injury in 2019  Additional pertinent review of systems negative.   Current Outpatient Medications:    aspirin EC 81 MG tablet, Take 81 mg by mouth daily. Swallow whole., Disp: , Rfl:    atorvastatin (LIPITOR) 40 MG tablet, TAKE 1 TABLET BY MOUTH EVERY DAY, Disp: 90 tablet, Rfl: 3   azelastine (ASTELIN) 0.1 %  nasal spray, Place 1 spray into both nostrils 2 (two) times daily. Use in each nostril as directed, Disp: 30 mL, Rfl: 12   Colostrum 500 MG CAPS, Take 500 mg by mouth daily., Disp: , Rfl:    fluticasone (FLONASE) 50 MCG/ACT nasal spray, Place 2 sprays into both nostrils daily., Disp: 16 mL, Rfl: 6   meloxicam (MOBIC) 15 MG tablet, Take 1 tablet (15 mg total) by mouth daily., Disp: 30 tablet, Rfl: 0   Misc Natural Products (SUPER GREENS PO), Take 1 Scoop by mouth daily. , Disp:  , Rfl:    telmisartan (MICARDIS) 80 MG tablet, Take 1 tablet (80 mg total) by mouth daily., Disp: 90 tablet, Rfl: 3   Objective:     Vitals:   05/22/22 1028  BP: 138/82  Pulse: 66  SpO2: 97%  Weight: 200 lb (90.7 kg)  Height: 6\' 2"  (1.88 m)      Body mass index is 25.68 kg/m.    Physical Exam:    General:  awake, alert oriented, no acute distress nontoxic Skin: no suspicious lesions or rashes Neuro:sensation intact and strength 5/5 with no deficits, no atrophy, normal muscle tone Psych: No signs of anxiety, depression or other mood disorder   Left knee: Left swelling, moderately decreased from previous office visit No deformity Positive fluid wave, joint milking   healed abrasion superficial to patella  NTTP patella NTTP over the quad tendon, medial fem condyle, lat fem condyle, patella, plica, patella tendon, tibial tuberostiy, fibular head, posterior fossa, pes anserine bursa, gerdy's tubercle, medial jt line, lateral jt line No pain with knee flexion and extension against gravity No pain with weightbearing in hinged knee brace    Electronically signed by:  Charles Tate D.[facility] 11:15 AM 05/22/22

## 2022-05-23 ENCOUNTER — Telehealth: Payer: Self-pay

## 2022-05-23 ENCOUNTER — Encounter: Payer: Self-pay | Admitting: Internal Medicine

## 2022-05-23 ENCOUNTER — Ambulatory Visit: Payer: Medicare HMO | Attending: Sports Medicine

## 2022-05-23 DIAGNOSIS — R0982 Postnasal drip: Secondary | ICD-10-CM | POA: Diagnosis not present

## 2022-05-23 DIAGNOSIS — R2689 Other abnormalities of gait and mobility: Secondary | ICD-10-CM | POA: Diagnosis not present

## 2022-05-23 DIAGNOSIS — S022XXA Fracture of nasal bones, initial encounter for closed fracture: Secondary | ICD-10-CM | POA: Diagnosis not present

## 2022-05-23 DIAGNOSIS — M25662 Stiffness of left knee, not elsewhere classified: Secondary | ICD-10-CM | POA: Diagnosis not present

## 2022-05-23 DIAGNOSIS — S82045D Nondisplaced comminuted fracture of left patella, subsequent encounter for closed fracture with routine healing: Secondary | ICD-10-CM | POA: Insufficient documentation

## 2022-05-23 DIAGNOSIS — M6281 Muscle weakness (generalized): Secondary | ICD-10-CM | POA: Insufficient documentation

## 2022-05-23 DIAGNOSIS — M25562 Pain in left knee: Secondary | ICD-10-CM | POA: Diagnosis not present

## 2022-05-23 NOTE — Progress Notes (Unsigned)
      Subjective:    Patient ID: Charles Tate, male    DOB: 04-14-1950, 72 y.o.   MRN: [id number]     HPI Charles Tate is here for follow up of his chronic medical problems.  3/16 - ED after fall, LOC - has had multiple episodes of prodromal syncope.  That day he did not have a typical amount of food intake, had lightheadedness while upright and fell.  No CP.   Since fall had pain in head, neck, jaw.  No confusion.   EKG normal, cbc, cmp normal.  CT - nasal bone fx. Otherwise no fx, bleeds.     Medications and allergies reviewed with patient and updated if appropriate.  Current Outpatient Medications on File Prior to Visit  Medication Sig Dispense Refill   aspirin EC 81 MG tablet Take 81 mg by mouth daily. Swallow whole.     atorvastatin (LIPITOR) 40 MG tablet TAKE 1 TABLET BY MOUTH EVERY DAY 90 tablet 3   azelastine (ASTELIN) 0.1 % nasal spray Place 1 spray into both nostrils 2 (two) times daily. Use in each nostril as directed 30 mL 12   Colostrum 500 MG CAPS Take 500 mg by mouth daily.     fluticasone (FLONASE) 50 MCG/ACT nasal spray Place 2 sprays into both nostrils daily. 16 mL 6   meloxicam (MOBIC) 15 MG tablet Take 1 tablet (15 mg total) by mouth daily. 30 tablet 0   Misc Natural Products (SUPER GREENS PO) Take 1 Scoop by mouth daily.      telmisartan (MICARDIS) 80 MG tablet Take 1 tablet (80 mg total) by mouth daily. 90 tablet 3   No current facility-administered medications on file prior to visit.     Review of Systems     Objective:  There were no vitals filed for this visit. BP Readings from Last 3 Encounters:  05/22/22 138/82  05/19/22 (!) 142/66  05/03/22 120/68   Wt Readings from Last 3 Encounters:  05/22/22 200 lb (90.7 kg)  05/19/22 200 lb 13.4 oz (91.1 kg)  05/03/22 201 lb (91.2 kg)   There is no height or weight on file to calculate BMI.    Physical Exam     Lab Results  Component Value Date   WBC 8.8 05/19/2022   HGB 14.0 05/19/2022   HCT  40.9 05/19/2022   PLT 167 05/19/2022   GLUCOSE 121 (H) 05/19/2022   CHOL 128 02/12/2022   TRIG 97.0 02/12/2022   HDL 36.80 (L) 02/12/2022   LDLDIRECT 161.0 12/05/2018   LDLCALC 72 02/12/2022   ALT 30 02/12/2022   AST 29 02/12/2022   NA 135 05/19/2022   K 3.7 05/19/2022   CL 101 05/19/2022   CREATININE 0.91 05/19/2022   BUN 17 05/19/2022   CO2 26 05/19/2022   TSH 1.37 02/12/2022   PSA 3.90 02/12/2022   INR 1.1 03/15/2020   HGBA1C 6.1 02/12/2022     Assessment & Plan:    See Problem List for Assessment and Plan of chronic medical problems.

## 2022-05-23 NOTE — Therapy (Signed)
OUTPATIENT PHYSICAL THERAPY LOWER EXTREMITY EVALUATION   Patient Name: Charles Tate MRN: [id number] DOB:1950/04/07, 72 y.o., male Today's Date: 05/23/2022  END OF SESSION:  PT End of Session - 05/23/22 1102     Visit Number 1    Date for PT Re-Evaluation 08/15/22    PT Start Time 1100    PT Stop Time 1145    PT Time Calculation (min) 45 min    Activity Tolerance Patient tolerated treatment well    Behavior During Therapy WFL for tasks assessed/performed             Past Medical History:  Diagnosis Date   Allergy    Arthritis    left hip    GERD (gastroesophageal reflux disease)    per patient, resolved. 03/15/20   Heart murmur    adolescent rheumatic fever - developed murmur    Hyperlipidemia    Hypertension    RAD (reactive airway disease)    with RTIs   Tubular adenoma    Past Surgical History:  Procedure Laterality Date   COLONOSCOPY     colonoscopy with polypectomy  2013   Dr Ferdinand Lango, [place]   COLONOSCOPY WITH PROPOFOL N/A 07/01/2019   Procedure: COLONOSCOPY WITH PROPOFOL;  Surgeon: Irving Copas., MD;  Location: [facility];  Service: Gastroenterology;  Laterality: N/A;   COLONOSCOPY WITH PROPOFOL N/A 05/30/2020   Procedure: COLONOSCOPY WITH PROPOFOL;  Surgeon: Rush Landmark Telford Nab., MD;  Location: [facility];  Service: Gastroenterology;  Laterality: N/A;   ENDARTERECTOMY Right 04/11/2020   Procedure: Right Carotid Endarterectomy;  Surgeon: Elam Dutch, MD;  Location: [facility] OR;  Service: Vascular;  Laterality: Right;   ENDOSCOPIC MUCOSAL RESECTION N/A 07/01/2019   Procedure: ENDOSCOPIC MUCOSAL RESECTION;  Surgeon: Rush Landmark Telford Nab., MD;  Location: [facility];  Service: Gastroenterology;  Laterality: N/A;   ENDOSCOPIC MUCOSAL RESECTION N/A 05/30/2020   Procedure: ENDOSCOPIC MUCOSAL RESECTION;  Surgeon: Rush Landmark Telford Nab., MD;  Location: [facility];  Service: Gastroenterology;  Laterality: N/A;   HEMOSTASIS CLIP PLACEMENT   07/01/2019   Procedure: HEMOSTASIS CLIP PLACEMENT;  Surgeon: Irving Copas., MD;  Location: [facility];  Service: Gastroenterology;;   POLYPECTOMY     POLYPECTOMY  07/01/2019   Procedure: POLYPECTOMY;  Surgeon: Irving Copas., MD;  Location: [facility];  Service: Gastroenterology;;   POLYPECTOMY  05/30/2020   Procedure: POLYPECTOMY;  Surgeon: Irving Copas., MD;  Location: [facility];  Service: Gastroenterology;;   Lia [place] LIFTING INJECTION  07/01/2019   Procedure: SUBMUCOSAL LIFTING INJECTION;  Surgeon: Irving Copas., MD;  Location: [facility];  Service: Gastroenterology;;   SUBMUCOSAL TATTOO INJECTION  07/01/2019   Procedure: SUBMUCOSAL TATTOO INJECTION;  Surgeon: Irving Copas., MD;  Location: [facility];  Service: Gastroenterology;;   WISDOM TOOTH EXTRACTION     Patient Active Problem List   Diagnosis Date Noted   Sinus infection 03/19/2022   COVID 11/13/2021   Nuclear sclerotic cataract of both eyes 11/08/2020   Right shoulder pain 06/27/2020   S/P carotid endarterectomy, right 04/11/2020   Serrated adenoma of colon 03/12/2020   Aortic atherosclerosis ([place]) 02/03/2020   Stenosis of right carotid artery 01/26/2020   Branch retinal artery occlusion, right eye 01/19/2020   Posterior vitreous detachment of both eyes 01/19/2020   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) 08/31/2019   Tear of MCL (medial collateral ligament) of knee, left, initial encounter 08/05/2017   Left inguinal hernia 07/04/2017   Allergic rhinitis 12/28/2015   H/O: rheumatic  fever 12/28/2015   Prediabetes 12/27/2015   Rising PSA level 02/21/2014   Personal history of colonic polyps 02/17/2014   BPH with obstruction/lower urinary tract symptoms 05/04/2010   Hyperlipidemia 01/07/2009   Essential hypertension 09/26/2006    PCP: Billey Gosling  REFERRING PROVIDER: Glennon Mac  REFERRING DIAG:  [phone] (ICD-10-CM) - Acute pain of  left knee  S82.045D (ICD-10-CM) - Closed nondisplaced comminuted fracture of left patella with routine healing, subsequent encounter    THERAPY DIAG:  Stiffness of left knee, not elsewhere classified  Muscle weakness (generalized)  Other abnormalities of gait and mobility  Rationale for Evaluation and Treatment: Rehabilitation  ONSET DATE: 05/19/22  SUBJECTIVE:   SUBJECTIVE STATEMENT: This is my second day off of crutches after 7 weeks. Still keeping the brace on, they said yesterday to keep it on for 3 more weeks. It is not locked out, he put it on 40d yesterday. There has been some episodes where the knee feels like its moving and popping from the joint. I have some issues with BP and fainting, seeing a doctor tomorrow about it. I fractured the knee February 1st, and the last fall was Saturday and I broke my nose. Have not been dizzy since being off certain meds, I drove here and was able to do some grocery shopping too.   PERTINENT HISTORY: 05/19/2022 ED Patient presents with his son who assists with the history. Patient has had multiple episodes of prodromal syncope, has been seen by his physician, has had adjustments of his antihypertensives. His most recent episode was last month, resulting in a fall in which he broke his left knee. Today he notes he did not have a typical amount of food intake, had an episode of lightheadedness while upright, fell, no chest pain before or after the event. Since the event has had pain in his head, neck, soreness in his jaw. No confusion, disorientation, no broken teeth. Son notes the patient is interacting in a typical manner currently.    05/22/2022 Patient states knee feels great, had a syncope episode was standing for a longtime , broken nose, decreased ROM neck , thinks the falling could be a result in changes in medicine   PAIN:  Are you having pain? No  PRECAUTIONS: Fall  WEIGHT BEARING RESTRICTIONS: Yes WBAT  FALLS:  Has patient fallen in  last 6 months? Yes. Number of falls 2  LIVING ENVIRONMENT: Lives with: lives with their spouse Lives in: House/apartment Stairs: No Has following equipment at home: Single point cane, Environmental consultant - 2 wheeled, and Crutches  OCCUPATION: Semi-retired, run a Marketing executive business, sells insurance, and plays in a band  PLOF: Independent  PATIENT GOALS: helping the knee heal, increase strength to support the knee  NEXT MD VISIT: 06/12/22  OBJECTIVE:   DIAGNOSTIC FINDINGS:  IMPRESSION: 1. No evidence of acute fracture or dislocation 2. Small suprapatellar joint effusion. 3. Remote patellar fracture.   Closed nondisplaced comminuted fracture of left patella with routine healing, subsequent encounter -Subacute, improving, complicated, subsequent visit - Interval healing of comminuted fracture of left patella based on HPI, physical exam, x-ray images from ER visit on 05/19/2022 after repeat fall - Patient is 6-7 weeks out from fall which caused comminuted left patella fracture.  He has no tenderness to palpation of patella and no pain with weightbearing with knee and hinged knee brace - May advance to weightbearing as tolerated.  Recommend continuing to use hinged knee brace   COGNITION: Overall cognitive status: Within functional limits  for tasks assessed     SENSATION: WFL  EDEMA:  Some swelling in L quad  POSTURE: rounded shoulders and flexed trunk   PALPATION: TTP medial L knee and very tight in quads   LOWER EXTREMITY ROM:  Active ROM Right eval Left eval  Hip flexion  105  Hip extension    Hip abduction    Hip adduction    Hip internal rotation    Hip external rotation    Knee flexion  70  Knee extension  5  Ankle dorsiflexion    Ankle plantarflexion    Ankle inversion    Ankle eversion     (Blank rows = not tested)  LOWER EXTREMITY MMT:  MMT Right eval Left eval  Hip flexion  4  Hip extension    Hip abduction    Hip adduction    Hip internal rotation     Hip external rotation    Knee flexion  4+  Knee extension  4+  Ankle dorsiflexion    Ankle plantarflexion    Ankle inversion    Ankle eversion     (Blank rows = not tested)  FUNCTIONAL TESTS:  5 times sit to stand: 14.85s Timed up and go (TUG): 14.47s  GAIT: Distance walked: in clinic distances Assistive device utilized: None Level of assistance: Modified independence Comments: antalgic gait, brace is locked at 40d of flexion and 10d extension   TODAY'S TREATMENT:                                                                                                                              DATE: 05/23/22- EVAL   PATIENT EDUCATION:  Education details: POC and HEP Person educated: Patient Education method: Explanation Education comprehension: verbalized understanding  HOME EXERCISE PROGRAM: Access Code: [email or web address] URL: [email or web address] Date: 05/23/2022 Prepared by: Andris Baumann  Exercises - Supine Heel Slide with Strap  - 1 x daily - 7 x weekly - 2 sets - 10 reps - Mini Squat with Counter Support  - 1 x daily - 7 x weekly - 2 sets - 10 reps - Heel Raises with Counter Support  - 1 x daily - 7 x weekly - 2 sets - 10 reps - Standing March with Counter Support  - 1 x daily - 7 x weekly - 2 sets - 10 reps  ASSESSMENT:  CLINICAL IMPRESSION: Patient is a 72 y.o. male who was seen today for physical therapy evaluation and treatment for L knee pain following a patella fracture from a fall. His fall was in February and he just stopped using crutches 2 days ago. He has no tenderness to palpation of patella and no pain with weightbearing with and without the hinged knee brace. He does have some pain with flexion AROM/PROM past 60d and states it feels tight in his quads. His L quad is very tight and he still has some swelling around the knee. He was told to keep wearing  the brace for 3 more weeks despite being able to get better ranges without it on. With walking  activities we kept the brace on but he was able to do standing activities without it and no issues. He has a history of falls, and had another one Saturday where he blacked out. Suffered a broken nose. Patient will benefit from PT to address his gait abnormalities, work on his L knee ROM and strength to be able to return to PLOF.  OBJECTIVE IMPAIRMENTS: decreased mobility, difficulty walking, and decreased ROM.   REHAB POTENTIAL: Good  CLINICAL DECISION MAKING: Stable/uncomplicated  EVALUATION COMPLEXITY: Low   GOALS: Goals reviewed with patient? Yes  SHORT TERM GOALS: Target date: 07/04/22  Patient will be independent with initial HEP. Goal status: INITIAL  2.  Patient will complete TUG < 12s  Baseline: 14.47s Goal status: INITIAL   LONG TERM GOALS: Target date: 08/15/22  Patient will be independent with advanced/ongoing HEP to improve outcomes and carryover.  Goal status: INITIAL  2.  Patient will demonstrate improved L knee AROM to >/= 0-120 deg to allow for normal gait and stair mechanics. Baseline: 5-0-70 Goal status: INITIAL  3.  Patient will demonstrate improved functional LE strength as demonstrated by 5xSTS < 12s without UE support. Baseline: 14.85 with UE push off Goal status: INITIAL  4.  Patient will be able to ambulate 600' with normal gait pattern without increased pain to access community.  Baseline: antalgic with brace on  Goal status: INITIAL  5. Patient will be able to ascend/descend stairs with 1 HR and reciprocal step pattern safely to access home and community.  Goal status: INITIAL   PLAN:  PT FREQUENCY: 2x/week  PT DURATION: 12 weeks  PLANNED INTERVENTIONS: Therapeutic exercises, Therapeutic activity, Neuromuscular re-education, Balance training, Gait training, Patient/Family education, Self Care, Joint mobilization, Stair training, Dry Needling, Electrical stimulation, Cryotherapy, Moist heat, Vasopneumatic device, and Manual therapy  PLAN FOR  NEXT SESSION: can change brace to lock at 60d flexion, AAROM/PROM for L knee flexion, strengthening as tolerated, gait training   Andris Baumann, PT 05/23/2022, 11:54 AM

## 2022-05-23 NOTE — Telephone Encounter (Signed)
        Patient  visited [place] on 3/16    Telephone encounter attempt :  1st  A HIPAA compliant voice message was left requesting a return call.  Instructed patient to call back .   [facility] [phone] [address], [place], [place] [address] Phone: [phone] Email: [email or web address]

## 2022-05-23 NOTE — Patient Instructions (Signed)
      Blood work was ordered.   The lab is on the first floor.    Medications changes include :       A referral was ordered for XXX.     Someone will call you to schedule an appointment.    No follow-ups on file.  

## 2022-05-24 ENCOUNTER — Telehealth: Payer: Self-pay

## 2022-05-24 ENCOUNTER — Encounter: Payer: Self-pay | Admitting: Internal Medicine

## 2022-05-24 ENCOUNTER — Ambulatory Visit (INDEPENDENT_AMBULATORY_CARE_PROVIDER_SITE_OTHER): Payer: Medicare HMO | Admitting: Internal Medicine

## 2022-05-24 VITALS — BP 120/62 | HR 55 | Temp 98.2°F | Ht 74.0 in | Wt 200.0 lb

## 2022-05-24 DIAGNOSIS — R0981 Nasal congestion: Secondary | ICD-10-CM

## 2022-05-24 DIAGNOSIS — R55 Syncope and collapse: Secondary | ICD-10-CM | POA: Insufficient documentation

## 2022-05-24 DIAGNOSIS — I10 Essential (primary) hypertension: Secondary | ICD-10-CM

## 2022-05-24 DIAGNOSIS — J309 Allergic rhinitis, unspecified: Secondary | ICD-10-CM | POA: Diagnosis not present

## 2022-05-24 MED ORDER — AZELASTINE HCL 0.1 % NA SOLN: 1.0000 | Freq: Two times a day (BID) | NASAL | 12 refills | Status: AC

## 2022-05-24 MED ORDER — FLUTICASONE PROPIONATE 50 MCG/ACT NA SUSP: 2.0000 | Freq: Every day | NASAL | 6 refills | Status: DC

## 2022-05-24 NOTE — Assessment & Plan Note (Signed)
Chronic PND, congestion, drainage Continue flonase, astelin nasal sprays Otc anti-histamine

## 2022-05-24 NOTE — Assessment & Plan Note (Signed)
Chronic BP well controlled at home and here today Will be lenient with BP due to two episodes what sounds like vasovagal syncope from low BP Continue telmisartan 80 mg daily

## 2022-05-24 NOTE — Telephone Encounter (Signed)
        Patient  visited [facility] on 3/16    Telephone encounter attempt :  2nd  A HIPAA compliant voice message was left requesting a return call.  Instructed patient to call back .    [facility] [phone] [address], [place], [place] [address] Phone: [phone] Email: [email or web address]

## 2022-05-24 NOTE — Assessment & Plan Note (Signed)
Two episodes - likely related to low bp Had prodromal lightheadedness No CP, palps, SOB Hctz stopped, he also stopped prostate medication  Will start saw palmetto for BPH BP at home has been reasonably controlled  - continue to monitor at home Stay hydrated Advised if he feels lightheaded again needs to sit down

## 2022-05-25 ENCOUNTER — Telehealth: Payer: Self-pay

## 2022-05-25 NOTE — Telephone Encounter (Signed)
        Patient  visited [place] on 3/16   Telephone encounter attempt :  3rd  A HIPAA compliant voice message was left requesting a return call.  Instructed patient to call back .   [facility] [phone] [address], [place], [place] [address] Phone: [phone] Email: [email or web address]

## 2022-05-28 ENCOUNTER — Ambulatory Visit: Payer: Medicare HMO

## 2022-05-28 ENCOUNTER — Other Ambulatory Visit: Payer: Self-pay

## 2022-05-28 DIAGNOSIS — S82045D Nondisplaced comminuted fracture of left patella, subsequent encounter for closed fracture with routine healing: Secondary | ICD-10-CM | POA: Diagnosis not present

## 2022-05-28 DIAGNOSIS — M6281 Muscle weakness (generalized): Secondary | ICD-10-CM | POA: Diagnosis not present

## 2022-05-28 DIAGNOSIS — M25662 Stiffness of left knee, not elsewhere classified: Secondary | ICD-10-CM

## 2022-05-28 DIAGNOSIS — M25562 Pain in left knee: Secondary | ICD-10-CM | POA: Diagnosis not present

## 2022-05-28 DIAGNOSIS — R2689 Other abnormalities of gait and mobility: Secondary | ICD-10-CM | POA: Diagnosis not present

## 2022-05-28 NOTE — Therapy (Signed)
OUTPATIENT PHYSICAL THERAPY LOWER EXTREMITY EVALUATION   Patient Name: Charles Tate MRN: [id number] DOB:1951-02-04, 72 y.o., male Today's Date: 05/28/2022  END OF SESSION:  PT End of Session - 05/28/22 0803     Visit Number 2    Date for PT Re-Evaluation 08/15/22    PT Start Time 0803    PT Stop Time 0845    PT Time Calculation (min) 42 min    Activity Tolerance Patient tolerated treatment well    Behavior During Therapy [facility] for tasks assessed/performed             Past Medical History:  Diagnosis Date   Allergy    Arthritis    left hip    GERD (gastroesophageal reflux disease)    per patient, resolved. 03/15/20   Heart murmur    adolescent rheumatic fever - developed murmur    Hyperlipidemia    Hypertension    RAD (reactive airway disease)    with RTIs   Tubular adenoma    Past Surgical History:  Procedure Laterality Date   COLONOSCOPY     colonoscopy with polypectomy  2013   Dr Ferdinand Lango, [place]   COLONOSCOPY WITH PROPOFOL N/A 07/01/2019   Procedure: COLONOSCOPY WITH PROPOFOL;  Surgeon: Irving Copas., MD;  Location: [facility];  Service: Gastroenterology;  Laterality: N/A;   COLONOSCOPY WITH PROPOFOL N/A 05/30/2020   Procedure: COLONOSCOPY WITH PROPOFOL;  Surgeon: Rush Landmark Telford Nab., MD;  Location: [facility];  Service: Gastroenterology;  Laterality: N/A;   ENDARTERECTOMY Right 04/11/2020   Procedure: Right Carotid Endarterectomy;  Surgeon: Elam Dutch, MD;  Location: [facility] OR;  Service: Vascular;  Laterality: Right;   ENDOSCOPIC MUCOSAL RESECTION N/A 07/01/2019   Procedure: ENDOSCOPIC MUCOSAL RESECTION;  Surgeon: Rush Landmark Telford Nab., MD;  Location: [facility];  Service: Gastroenterology;  Laterality: N/A;   ENDOSCOPIC MUCOSAL RESECTION N/A 05/30/2020   Procedure: ENDOSCOPIC MUCOSAL RESECTION;  Surgeon: Rush Landmark Telford Nab., MD;  Location: [facility];  Service: Gastroenterology;  Laterality: N/A;   HEMOSTASIS CLIP PLACEMENT   07/01/2019   Procedure: HEMOSTASIS CLIP PLACEMENT;  Surgeon: Irving Copas., MD;  Location: [facility];  Service: Gastroenterology;;   POLYPECTOMY     POLYPECTOMY  07/01/2019   Procedure: POLYPECTOMY;  Surgeon: Irving Copas., MD;  Location: [facility];  Service: Gastroenterology;;   POLYPECTOMY  05/30/2020   Procedure: POLYPECTOMY;  Surgeon: Irving Copas., MD;  Location: [facility];  Service: Gastroenterology;;   Lia Foyer LIFTING INJECTION  07/01/2019   Procedure: SUBMUCOSAL LIFTING INJECTION;  Surgeon: Irving Copas., MD;  Location: [facility];  Service: Gastroenterology;;   SUBMUCOSAL TATTOO INJECTION  07/01/2019   Procedure: SUBMUCOSAL TATTOO INJECTION;  Surgeon: Irving Copas., MD;  Location: [facility];  Service: Gastroenterology;;   WISDOM TOOTH EXTRACTION     Patient Active Problem List   Diagnosis Date Noted   Syncope 05/24/2022   Postnasal drip 05/23/2022   Sinus infection 03/19/2022   COVID 11/13/2021   Nuclear sclerotic cataract of both eyes 11/08/2020   Right shoulder pain 06/27/2020   S/P carotid endarterectomy, right 04/11/2020   Serrated adenoma of colon 03/12/2020   Aortic atherosclerosis ([place]) 02/03/2020   Stenosis of right carotid artery 01/26/2020   Branch retinal artery occlusion, right eye 01/19/2020   Posterior vitreous detachment of both eyes 01/19/2020   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) 08/31/2019   Tear of MCL (medial collateral ligament) of knee, left, initial encounter 08/05/2017   Left inguinal hernia 07/04/2017  Allergic rhinitis 12/28/2015   H/O: rheumatic fever 12/28/2015   Prediabetes 12/27/2015   Rising PSA level 02/21/2014   Personal history of colonic polyps 02/17/2014   BPH with obstruction/lower urinary tract symptoms 05/04/2010   Hyperlipidemia 01/07/2009   Essential hypertension 09/26/2006    PCP: Billey Gosling  REFERRING PROVIDER: Glennon Mac  REFERRING DIAG:  [phone] (ICD-10-CM) - Acute pain of left knee  S82.045D (ICD-10-CM) - Closed nondisplaced comminuted fracture of left patella with routine healing, subsequent encounter    THERAPY DIAG:  Stiffness of left knee, not elsewhere classified  Muscle weakness (generalized)  Other abnormalities of gait and mobility  Rationale for Evaluation and Treatment: Rehabilitation  ONSET DATE: 05/19/22  SUBJECTIVE:   SUBJECTIVE STATEMENT: I saw my PCP and they adjusted my meds so that I shouldn't have any more syncopal episodes.  L knee feels good.  PERTINENT HISTORY: 05/19/2022 ED Patient presents with his son who assists with the history. Patient has had multiple episodes of prodromal syncope, has been seen by his physician, has had adjustments of his antihypertensives. His most recent episode was last month, resulting in a fall in which he broke his left knee. Today he notes he did not have a typical amount of food intake, had an episode of lightheadedness while upright, fell, no chest pain before or after the event. Since the event has had pain in his head, neck, soreness in his jaw. No confusion, disorientation, no broken teeth. Son notes the patient is interacting in a typical manner currently.    05/22/2022 Patient states knee feels great, had a syncope episode was standing for a longtime , broken nose, decreased ROM neck , thinks the falling could be a result in changes in medicine   PAIN:  Are you having pain? No  PRECAUTIONS: Fall  WEIGHT BEARING RESTRICTIONS: Yes WBAT  FALLS:  Has patient fallen in last 6 months? Yes. Number of falls 2  LIVING ENVIRONMENT: Lives with: lives with their spouse Lives in: House/apartment Stairs: No Has following equipment at home: Single point cane, Environmental consultant - 2 wheeled, and Crutches  OCCUPATION: Semi-retired, run a Marketing executive business, sells insurance, and plays in a band  PLOF: Independent  PATIENT GOALS: helping the  knee heal, increase strength to support the knee  NEXT MD VISIT: 06/12/22  OBJECTIVE:   DIAGNOSTIC FINDINGS:  IMPRESSION: 1. No evidence of acute fracture or dislocation 2. Small suprapatellar joint effusion. 3. Remote patellar fracture.   Closed nondisplaced comminuted fracture of left patella with routine healing, subsequent encounter -Subacute, improving, complicated, subsequent visit - Interval healing of comminuted fracture of left patella based on HPI, physical exam, x-ray images from ER visit on 05/19/2022 after repeat fall - Patient is 6-7 weeks out from fall which caused comminuted left patella fracture.  He has no tenderness to palpation of patella and no pain with weightbearing with knee and hinged knee brace - May advance to weightbearing as tolerated.  Recommend continuing to use hinged knee brace   COGNITION: Overall cognitive status: Within functional limits for tasks assessed     SENSATION: WFL  EDEMA:  Some swelling in L quad  POSTURE: rounded shoulders and flexed trunk   PALPATION: TTP medial L knee and very tight in quads   LOWER EXTREMITY ROM:  Active ROM Right eval Left eval  Hip flexion  105  Hip extension    Hip abduction    Hip adduction    Hip internal rotation    Hip external rotation  Knee flexion  70  Knee extension  5  Ankle dorsiflexion    Ankle plantarflexion    Ankle inversion    Ankle eversion     (Blank rows = not tested)  LOWER EXTREMITY MMT:  MMT Right eval Left eval  Hip flexion  4  Hip extension    Hip abduction    Hip adduction    Hip internal rotation    Hip external rotation    Knee flexion  4+  Knee extension  4+  Ankle dorsiflexion    Ankle plantarflexion    Ankle inversion    Ankle eversion     (Blank rows = not tested)  FUNCTIONAL TESTS:  5 times sit to stand: 14.85s Timed up and go (TUG): 14.47s  GAIT: Distance walked: in clinic distances Assistive device utilized: None Level of assistance:  Modified independence Comments: antalgic gait, brace is locked at 40d of flexion and 10d extension   TODAY'S TREATMENT:                                                                                                                              DATE: 05/28/22: Brace removed for all of therex: Therapeutic exercise: Supine for quad sets, therapist with tactile cues L post knee  Supine for SLR tactile cues to engage quads throughout motion Nustep: 5 min 3 sec, UE's and LE's level 5 Seated for LAQ's, manual assist by PT for terminal 15 degrees of ext, with eccentric lowering 20x Seated L hamstring curls, blue band, 20x L knee flexion stretch in sitting, towel under L foot, to 92 degrees Standing for 3 way hip L 2# cuff weight 15 x Standing for towel slides L LE, F/B, S/S, CW/CCW, to fatigue Sit to stand from elevated mat 10 reps no UE support.  05/23/22- EVAL   PATIENT EDUCATION:  Education details: POC and HEP Person educated: Patient Education method: Explanation Education comprehension: verbalized understanding  HOME EXERCISE PROGRAM: Access Code: [email or web address] URL: [email or web address] Date: 05/23/2022 Prepared by: Andris Baumann  Exercises - Supine Heel Slide with Strap  - 1 x daily - 7 x weekly - 2 sets - 10 reps - Mini Squat with Counter Support  - 1 x daily - 7 x weekly - 2 sets - 10 reps - Heel Raises with Counter Support  - 1 x daily - 7 x weekly - 2 sets - 10 reps - Standing March with Counter Support  - 1 x daily - 7 x weekly - 2 sets - 10 reps  ASSESSMENT:  CLINICAL IMPRESSION: Patient is a 72 y.o. male who was seen today for physical therapy treatment for L knee pain following a patella fracture from a fall in February.  Today was his second PT visit for this diagnosis.  He was able to increase his flexibility today for L knee flexion and we carefully progressed his strength/ ROM with open and closed chain activities.  Still edematous L distal thigh.  Tolerated  well.  Patient will benefit from PT to address his gait abnormalities, work on his L knee ROM and strength to be able to return to PLOF.  OBJECTIVE IMPAIRMENTS: decreased mobility, difficulty walking, and decreased ROM.   REHAB POTENTIAL: Good  CLINICAL DECISION MAKING: Stable/uncomplicated  EVALUATION COMPLEXITY: Low   GOALS: Goals reviewed with patient? Yes  SHORT TERM GOALS: Target date: 07/04/22  Patient will be independent with initial HEP. Goal status: INITIAL  2.  Patient will complete TUG < 12s  Baseline: 14.47s Goal status: INITIAL   LONG TERM GOALS: Target date: 08/15/22  Patient will be independent with advanced/ongoing HEP to improve outcomes and carryover.  Goal status: INITIAL  2.  Patient will demonstrate improved L knee AROM to >/= 0-120 deg to allow for normal gait and stair mechanics. Baseline: 5-0-70 Goal status: INITIAL  3.  Patient will demonstrate improved functional LE strength as demonstrated by 5xSTS < 12s without UE support. Baseline: 14.85 with UE push off Goal status: INITIAL  4.  Patient will be able to ambulate 600' with normal gait pattern without increased pain to access community.  Baseline: antalgic with brace on  Goal status: INITIAL  5. Patient will be able to ascend/descend stairs with 1 HR and reciprocal step pattern safely to access home and community.  Goal status: INITIAL   PLAN:  PT FREQUENCY: 2x/week  PT DURATION: 12 weeks  PLANNED INTERVENTIONS: Therapeutic exercises, Therapeutic activity, Neuromuscular re-education, Balance training, Gait training, Patient/Family education, Self Care, Joint mobilization, Stair training, Dry Needling, Electrical stimulation, Cryotherapy, Moist heat, Vasopneumatic device, and Manual therapy  PLAN FOR NEXT SESSION: can change brace to lock at 60d flexion, AAROM/PROM for L knee flexion, strengthening as tolerated, gait training   Maitland Muhlbauer L Mallie Linnemann, PT 05/28/2022, 9:01 AM

## 2022-06-04 ENCOUNTER — Ambulatory Visit: Payer: Medicare HMO | Admitting: Physical Therapy

## 2022-06-07 ENCOUNTER — Ambulatory Visit: Payer: Medicare HMO | Attending: Sports Medicine | Admitting: Physical Therapy

## 2022-06-07 ENCOUNTER — Encounter: Payer: Self-pay | Admitting: Physical Therapy

## 2022-06-07 DIAGNOSIS — R293 Abnormal posture: Secondary | ICD-10-CM | POA: Insufficient documentation

## 2022-06-07 DIAGNOSIS — M6281 Muscle weakness (generalized): Secondary | ICD-10-CM | POA: Diagnosis present

## 2022-06-07 DIAGNOSIS — M25511 Pain in right shoulder: Secondary | ICD-10-CM | POA: Diagnosis present

## 2022-06-07 DIAGNOSIS — M25611 Stiffness of right shoulder, not elsewhere classified: Secondary | ICD-10-CM | POA: Diagnosis present

## 2022-06-07 DIAGNOSIS — M542 Cervicalgia: Secondary | ICD-10-CM | POA: Insufficient documentation

## 2022-06-07 DIAGNOSIS — R2689 Other abnormalities of gait and mobility: Secondary | ICD-10-CM | POA: Insufficient documentation

## 2022-06-07 DIAGNOSIS — M25662 Stiffness of left knee, not elsewhere classified: Secondary | ICD-10-CM | POA: Diagnosis present

## 2022-06-07 DIAGNOSIS — R600 Localized edema: Secondary | ICD-10-CM | POA: Diagnosis present

## 2022-06-07 NOTE — Therapy (Signed)
OUTPATIENT PHYSICAL THERAPY LOWER EXTREMITY TREATMENT   Patient Name: Charles Tate MRN: [id number] DOB:05/27/1950, 72 y.o., male Today's Date: 06/07/2022  END OF SESSION:  PT End of Session - 06/07/22 1346     Visit Number 3    Date for PT Re-Evaluation 08/15/22    PT Start Time 1346    PT Stop Time 1430    PT Time Calculation (min) 44 min    Activity Tolerance Patient tolerated treatment well             Past Medical History:  Diagnosis Date   Allergy    Arthritis    left hip    GERD (gastroesophageal reflux disease)    per patient, resolved. 03/15/20   Heart murmur    adolescent rheumatic fever - developed murmur    Hyperlipidemia    Hypertension    RAD (reactive airway disease)    with RTIs   Tubular adenoma    Past Surgical History:  Procedure Laterality Date   COLONOSCOPY     colonoscopy with polypectomy  2013   Dr Ferdinand Lango, [place]   COLONOSCOPY WITH PROPOFOL N/A 07/01/2019   Procedure: COLONOSCOPY WITH PROPOFOL;  Surgeon: Irving Copas., MD;  Location: [facility];  Service: Gastroenterology;  Laterality: N/A;   COLONOSCOPY WITH PROPOFOL N/A 05/30/2020   Procedure: COLONOSCOPY WITH PROPOFOL;  Surgeon: Rush Landmark Telford Nab., MD;  Location: [facility];  Service: Gastroenterology;  Laterality: N/A;   ENDARTERECTOMY Right 04/11/2020   Procedure: Right Carotid Endarterectomy;  Surgeon: Elam Dutch, MD;  Location: [facility] OR;  Service: Vascular;  Laterality: Right;   ENDOSCOPIC MUCOSAL RESECTION N/A 07/01/2019   Procedure: ENDOSCOPIC MUCOSAL RESECTION;  Surgeon: Rush Landmark Telford Nab., MD;  Location: [facility];  Service: Gastroenterology;  Laterality: N/A;   ENDOSCOPIC MUCOSAL RESECTION N/A 05/30/2020   Procedure: ENDOSCOPIC MUCOSAL RESECTION;  Surgeon: Rush Landmark Telford Nab., MD;  Location: [facility];  Service: Gastroenterology;  Laterality: N/A;   HEMOSTASIS CLIP PLACEMENT  07/01/2019   Procedure: HEMOSTASIS CLIP PLACEMENT;  Surgeon:  Irving Copas., MD;  Location: [facility];  Service: Gastroenterology;;   POLYPECTOMY     POLYPECTOMY  07/01/2019   Procedure: POLYPECTOMY;  Surgeon: Irving Copas., MD;  Location: [facility];  Service: Gastroenterology;;   POLYPECTOMY  05/30/2020   Procedure: POLYPECTOMY;  Surgeon: Irving Copas., MD;  Location: [facility];  Service: Gastroenterology;;   Lia Foyer LIFTING INJECTION  07/01/2019   Procedure: SUBMUCOSAL LIFTING INJECTION;  Surgeon: Irving Copas., MD;  Location: [facility];  Service: Gastroenterology;;   SUBMUCOSAL TATTOO INJECTION  07/01/2019   Procedure: SUBMUCOSAL TATTOO INJECTION;  Surgeon: Irving Copas., MD;  Location: [facility];  Service: Gastroenterology;;   WISDOM TOOTH EXTRACTION     Patient Active Problem List   Diagnosis Date Noted   Syncope 05/24/2022   Postnasal drip 05/23/2022   Sinus infection 03/19/2022   COVID 11/13/2021   Nuclear sclerotic cataract of both eyes 11/08/2020   Right shoulder pain 06/27/2020   S/P carotid endarterectomy, right 04/11/2020   Serrated adenoma of colon 03/12/2020   Aortic atherosclerosis 02/03/2020   Stenosis of right carotid artery 01/26/2020   Branch retinal artery occlusion, right eye 01/19/2020   Posterior vitreous detachment of both eyes 01/19/2020   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) 08/31/2019   Tear of MCL (medial collateral ligament) of knee, left, initial encounter 08/05/2017   Left inguinal hernia 07/04/2017   Allergic rhinitis 12/28/2015   H/O: rheumatic fever 12/28/2015  Prediabetes 12/27/2015   Rising PSA level 02/21/2014   Personal history of colonic polyps 02/17/2014   BPH with obstruction/lower urinary tract symptoms 05/04/2010   Hyperlipidemia 01/07/2009   Essential hypertension 09/26/2006    PCP: Billey Gosling  REFERRING PROVIDER: Glennon Mac  REFERRING DIAG:  [phone] (ICD-10-CM) - Acute pain of left knee   S82.045D (ICD-10-CM) - Closed nondisplaced comminuted fracture of left patella with routine healing, subsequent encounter    THERAPY DIAG:  Muscle weakness (generalized)  Other abnormalities of gait and mobility  Stiffness of left knee, not elsewhere classified  Localized edema  Rationale for Evaluation and Treatment: Rehabilitation  ONSET DATE: 05/19/22  SUBJECTIVE:   SUBJECTIVE STATEMENT: "It is tight" foot and ankle has a lot of swelling PERTINENT HISTORY: 05/19/2022 ED Patient presents with his son who assists with the history. Patient has had multiple episodes of prodromal syncope, has been seen by his physician, has had adjustments of his antihypertensives. His most recent episode was last month, resulting in a fall in which he broke his left knee. Today he notes he did not have a typical amount of food intake, had an episode of lightheadedness while upright, fell, no chest pain before or after the event. Since the event has had pain in his head, neck, soreness in his jaw. No confusion, disorientation, no broken teeth. Son notes the patient is interacting in a typical manner currently. .     05/22/2022 Patient states knee feels great, had a syncope episode was standing for a longtime , broken nose, decreased ROM neck , thinks the falling could be a result in changes in medicine   PAIN:  Are you having pain? No  PRECAUTIONS: Fall  WEIGHT BEARING RESTRICTIONS: Yes WBAT  FALLS:  Has patient fallen in last 6 months? Yes. Number of falls 2  LIVING ENVIRONMENT: Lives with: lives with their spouse Lives in: House/apartment Stairs: No Has following equipment at home: Single point cane, Environmental consultant - 2 wheeled, and Crutches  OCCUPATION: Semi-retired, run a Marketing executive business, sells insurance, and plays in a band  PLOF: Independent  PATIENT GOALS: helping the knee heal, increase strength to support the knee  NEXT MD VISIT: 06/12/22  OBJECTIVE:   DIAGNOSTIC FINDINGS:   IMPRESSION: 1. No evidence of acute fracture or dislocation 2. Small suprapatellar joint effusion. 3. Remote patellar fracture.   Closed nondisplaced comminuted fracture of left patella with routine healing, subsequent encounter -Subacute, improving, complicated, subsequent visit - Interval healing of comminuted fracture of left patella based on HPI, physical exam, x-ray images from ER visit on 05/19/2022 after repeat fall - Patient is 6-7 weeks out from fall which caused comminuted left patella fracture.  He has no tenderness to palpation of patella and no pain with weightbearing with knee and hinged knee brace - May advance to weightbearing as tolerated.  Recommend continuing to use hinged knee brace   COGNITION: Overall cognitive status: Within functional limits for tasks assessed     SENSATION: WFL  EDEMA:  Some swelling in L quad  POSTURE: rounded shoulders and flexed trunk   PALPATION: TTP medial L knee and very tight in quads   LOWER EXTREMITY ROM:  Active ROM Right eval Left eval  Hip flexion  105  Hip extension    Hip abduction    Hip adduction    Hip internal rotation    Hip external rotation    Knee flexion  70  Knee extension  5  Ankle dorsiflexion    Ankle  plantarflexion    Ankle inversion    Ankle eversion     (Blank rows = not tested)  LOWER EXTREMITY MMT:  MMT Right eval Left eval  Hip flexion  4  Hip extension    Hip abduction    Hip adduction    Hip internal rotation    Hip external rotation    Knee flexion  4+  Knee extension  4+  Ankle dorsiflexion    Ankle plantarflexion    Ankle inversion    Ankle eversion     (Blank rows = not tested)  FUNCTIONAL TESTS:  5 times sit to stand: 14.85s Timed up and go (TUG): 14.47s  GAIT: Distance walked: in clinic distances Assistive device utilized: None Level of assistance: Modified independence Comments: antalgic gait, brace is locked at 40d of flexion and 10d extension   TODAY'S  TREATMENT:                                                                                                                              DATE: 06/07/22 No brace  Supine LLE quad sets SAQ LLE 2lv 2x10 LLE SLR x10 x5 LLE SLR w/ ER x5  LLE supine heel slides 2x10 Sit to stand from elevated mat 2x10 no UE support. Seated towel slides LLE flex 2x10     05/28/22: Brace removed for all of therex: Therapeutic exercise: Supine for quad sets, therapist with tactile cues L post knee  Supine for SLR tactile cues to engage quads throughout motion Nustep: 5 min 3 sec, UE's and LE's level 5 Seated for LAQ's, manual assist by PT for terminal 15 degrees of ext, with eccentric lowering 20x Seated L hamstring curls, blue band, 20x L knee flexion stretch in sitting, towel under L foot, to 92 degrees Standing for 3 way hip L 2# cuff weight 15 x Standing for towel slides L LE, F/B, S/S, CW/CCW, to fatigue Sit to stand from elevated mat 10 reps no UE support.  05/23/22- EVAL   PATIENT EDUCATION:  Education details: POC and HEP Person educated: Patient Education method: Explanation Education comprehension: verbalized understanding  HOME EXERCISE PROGRAM: Access Code: [email or web address] URL: [email or web address] Date: 05/23/2022 Prepared by: Andris Baumann  Exercises - Supine Heel Slide with Strap  - 1 x daily - 7 x weekly - 2 sets - 10 reps - Mini Squat with Counter Support  - 1 x daily - 7 x weekly - 2 sets - 10 reps - Heel Raises with Counter Support  - 1 x daily - 7 x weekly - 2 sets - 10 reps - Standing March with Counter Support  - 1 x daily - 7 x weekly - 2 sets - 10 reps  ASSESSMENT:  CLINICAL IMPRESSION: Patient is a 72 y.o. male who was seen today for physical therapy treatment for L knee pain following a patella fracture from a fall in February.  Session focuses on quad strength and gentle ROM.  Pt  unable to fet full active TKE with QUAD sets. Weakness present with supine SLR in hip  flexor.  Patient will benefit from PT to address his gait abnormalities, work on his L knee ROM and strength to be able to return to PLOF.  OBJECTIVE IMPAIRMENTS: decreased mobility, difficulty walking, and decreased ROM.   REHAB POTENTIAL: Good  CLINICAL DECISION MAKING: Stable/uncomplicated  EVALUATION COMPLEXITY: Low   GOALS: Goals reviewed with patient? Yes  SHORT TERM GOALS: Target date: 07/04/22  Patient will be independent with initial HEP. Goal status: INITIAL  2.  Patient will complete TUG < 12s  Baseline: 14.47s Goal status: INITIAL   LONG TERM GOALS: Target date: 08/15/22  Patient will be independent with advanced/ongoing HEP to improve outcomes and carryover.  Goal status: INITIAL  2.  Patient will demonstrate improved L knee AROM to >/= 0-120 deg to allow for normal gait and stair mechanics. Baseline: 5-0-70 Goal status: INITIAL  3.  Patient will demonstrate improved functional LE strength as demonstrated by 5xSTS < 12s without UE support. Baseline: 14.85 with UE push off Goal status: INITIAL  4.  Patient will be able to ambulate 600' with normal gait pattern without increased pain to access community.  Baseline: antalgic with brace on  Goal status: INITIAL  5. Patient will be able to ascend/descend stairs with 1 HR and reciprocal step pattern safely to access home and community.  Goal status: INITIAL   PLAN:  PT FREQUENCY: 2x/week  PT DURATION: 12 weeks  PLANNED INTERVENTIONS: Therapeutic exercises, Therapeutic activity, Neuromuscular re-education, Balance training, Gait training, Patient/Family education, Self Care, Joint mobilization, Stair training, Dry Needling, Electrical stimulation, Cryotherapy, Moist heat, Vasopneumatic device, and Manual therapy  PLAN FOR NEXT SESSION: can change brace to lock at 60d flexion, AAROM/PROM for L knee flexion, strengthening as tolerated, gait training   Scot Jun, PTA 06/07/2022, 1:47 PM

## 2022-06-11 ENCOUNTER — Ambulatory Visit: Payer: Medicare HMO

## 2022-06-11 DIAGNOSIS — R600 Localized edema: Secondary | ICD-10-CM

## 2022-06-11 DIAGNOSIS — M25662 Stiffness of left knee, not elsewhere classified: Secondary | ICD-10-CM

## 2022-06-11 DIAGNOSIS — R293 Abnormal posture: Secondary | ICD-10-CM | POA: Diagnosis not present

## 2022-06-11 DIAGNOSIS — R2689 Other abnormalities of gait and mobility: Secondary | ICD-10-CM | POA: Diagnosis not present

## 2022-06-11 DIAGNOSIS — M25611 Stiffness of right shoulder, not elsewhere classified: Secondary | ICD-10-CM | POA: Diagnosis not present

## 2022-06-11 DIAGNOSIS — M25511 Pain in right shoulder: Secondary | ICD-10-CM | POA: Diagnosis not present

## 2022-06-11 DIAGNOSIS — M542 Cervicalgia: Secondary | ICD-10-CM | POA: Diagnosis not present

## 2022-06-11 DIAGNOSIS — M6281 Muscle weakness (generalized): Secondary | ICD-10-CM | POA: Diagnosis not present

## 2022-06-11 NOTE — Progress Notes (Unsigned)
Charles Tate [facility] [address] [place] [address] Phone: [phone]   Assessment and Plan:     There are no diagnoses linked to this encounter.  ***   Pertinent previous records reviewed include ***   Follow Up: ***     Subjective:   I, Charles Tate, am serving as a Neurosurgeon for Charles Tate   Chief Complaint: left knee pain    HPI:    04/05/22 Patient is a 72 year old male complaining of left knee pain. Patient states he fell today he has pain with extension and he has pain when walking he notes some swelling at the top , no numbness or tingling, no radiating pain when he flexes his foot he has some tightness in his knee,he states his knee has been "popping left or right' but no pain he states he has an "unstable knee"   Is noting some lightheadness and dizziness but states he is taking new blood pressure meds and is on predisone dos pak    04/12/2022 Patient states that he has swelling not as much , but pain has gotten better, knee brace immobilizer    05/03/2022 Patient states he still has some swelling, no pain just tender and tight    05/19/2022 ED Patient presents with his son who assists with the history. Patient has had multiple episodes of prodromal syncope, has been seen by his physician, has had adjustments of his antihypertensives. His most recent episode was last month, resulting in a fall in which he broke his left knee. Today he notes he did not have a typical amount of food intake, had an episode of lightheadedness while upright, fell, no chest pain before or after the event. Since the event has had pain in his head, neck, soreness in his jaw. No confusion, disorientation, no broken teeth. Son notes the patient is interacting in a typical manner currently.     05/22/2022 Patient states knee feels great, had a syncope episode was standing for a longtime , broken nose, decreased ROM neck , thinks the  falling could be a result in changes in medicine     06/12/2022 Patient states   Relevant Historical Information:   Hypertension, history of left MCL injury in 2019  Additional pertinent review of systems negative.   Current Outpatient Medications:    aspirin EC 81 MG tablet, Take 81 mg by mouth daily. Swallow whole., Disp: , Rfl:    atorvastatin (LIPITOR) 40 MG tablet, TAKE 1 TABLET BY MOUTH EVERY DAY, Disp: 90 tablet, Rfl: 3   azelastine (ASTELIN) 0.1 % nasal spray, Place 1 spray into both nostrils 2 (two) times daily. Use in each nostril as directed, Disp: 30 mL, Rfl: 12   Colostrum 500 MG CAPS, Take 500 mg by mouth daily., Disp: , Rfl:    fluticasone (FLONASE) 50 MCG/ACT nasal spray, Place 2 sprays into both nostrils daily., Disp: 16 mL, Rfl: 6   Misc Natural Products (SUPER GREENS PO), Take 1 Scoop by mouth daily. , Disp: , Rfl:    telmisartan (MICARDIS) 80 MG tablet, Take 1 tablet (80 mg total) by mouth daily., Disp: 90 tablet, Rfl: 3   Objective:     There were no vitals filed for this visit.    There is no height or weight on file to calculate BMI.    Physical Exam:    ***   Electronically signed by:  Charles Tate [facility]  9:13 AM 06/11/22

## 2022-06-11 NOTE — Therapy (Signed)
OUTPATIENT PHYSICAL THERAPY LOWER EXTREMITY TREATMENT   Patient Name: Charles Tate MRN: [id number] DOB:12-10-1950, 72 y.o., male Today's Date: 06/11/2022  END OF SESSION:  PT End of Session - 06/11/22 1059     Visit Number 4    Date for PT Re-Evaluation 08/15/22    PT Start Time 1100    PT Stop Time 1145    PT Time Calculation (min) 45 min    Activity Tolerance Patient tolerated treatment well              Past Medical History:  Diagnosis Date   Allergy    Arthritis    left hip    GERD (gastroesophageal reflux disease)    per patient, resolved. 03/15/20   Heart murmur    adolescent rheumatic fever - developed murmur    Hyperlipidemia    Hypertension    RAD (reactive airway disease)    with RTIs   Tubular adenoma    Past Surgical History:  Procedure Laterality Date   COLONOSCOPY     colonoscopy with polypectomy  2013   Dr Noe GensPeters, [place]   COLONOSCOPY WITH PROPOFOL N/A 07/01/2019   Procedure: COLONOSCOPY WITH PROPOFOL;  Surgeon: Lemar LoftyMansouraty, Gabriel Jr., MD;  Location[facility];  Service: Gastroenterology;  Laterality: N/A;   COLONOSCOPY WITH PROPOFOL N/A 05/30/2020   Procedure: COLONOSCOPY WITH PROPOFOL;  Surgeon: Meridee ScoreMansouraty, Netty StarringGabriel Jr., MD;  Location: [facility];  Service: Gastroenterology;  Laterality: N/A;   ENDARTERECTOMY Right 04/11/2020   Procedure: Right Carotid Endarterectomy;  Surgeon: Sherren KernsFields, Kohen E, MD;  Location[facility];  Service: Vascular;  Laterality: Right;   ENDOSCOPIC MUCOSAL RESECTION N/A 07/01/2019   Procedure: ENDOSCOPIC MUCOSAL RESECTION;  Surgeon: Meridee ScoreMansouraty, Netty StarringGabriel Jr., MD;  Location: [facility];  Service: Gastroenterology;  Laterality: N/A;   ENDOSCOPIC MUCOSAL RESECTION N/A 05/30/2020   Procedure: ENDOSCOPIC MUCOSAL RESECTION;  Surgeon: Meridee ScoreMansouraty, Netty StarringGabriel Jr., MD;  Location: [facility];  Service: Gastroenterology;  Laterality: N/A;   HEMOSTASIS CLIP PLACEMENT  07/01/2019   Procedure: HEMOSTASIS CLIP PLACEMENT;  Surgeon:  Lemar LoftyMansouraty, Gabriel Jr., MD;  Location: [facility];  Service: Gastroenterology;;   POLYPECTOMY     POLYPECTOMY  07/01/2019   Procedure: POLYPECTOMY;  Surgeon: Lemar LoftyMansouraty, Gabriel Jr., MD;  Location: [facility];  Service: Gastroenterology;;   POLYPECTOMY  05/30/2020   Procedure: POLYPECTOMY;  Surgeon: Lemar LoftyMansouraty, Gabriel Jr., MD;  Location: [facility];  Service: Gastroenterology;;   Sunnie NielsenSUBMUCOSAL LIFTING INJECTION  07/01/2019   Procedure: SUBMUCOSAL LIFTING INJECTION;  Surgeon: Lemar LoftyMansouraty, Gabriel Jr., MD;  Location: [facility];  Service: Gastroenterology;;   SUBMUCOSAL TATTOO INJECTION  07/01/2019   Procedure: SUBMUCOSAL TATTOO INJECTION;  Surgeon: Lemar LoftyMansouraty, Gabriel Jr., MD;  Location: [facility];  Service: Gastroenterology;;   WISDOM TOOTH EXTRACTION     Patient Active Problem List   Diagnosis Date Noted   Syncope 05/24/2022   Postnasal drip 05/23/2022   Sinus infection 03/19/2022   COVID 11/13/2021   Nuclear sclerotic cataract of both eyes 11/08/2020   Right shoulder pain 06/27/2020   S/P carotid endarterectomy, right 04/11/2020   Serrated adenoma of colon 03/12/2020   Aortic atherosclerosis 02/03/2020   Stenosis of right carotid artery 01/26/2020   Branch retinal artery occlusion, right eye 01/19/2020   Posterior vitreous detachment of both eyes 01/19/2020   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) 08/31/2019   Tear of MCL (medial collateral ligament) of knee, left, initial encounter 08/05/2017   Left inguinal hernia 07/04/2017   Allergic rhinitis 12/28/2015   H/O: rheumatic fever  12/28/2015   Prediabetes 12/27/2015   Rising PSA level 02/21/2014   Personal history of colonic polyps 02/17/2014   BPH with obstruction/lower urinary tract symptoms 05/04/2010   Hyperlipidemia 01/07/2009   Essential hypertension 09/26/2006    PCP: Cheryll Cockayne  REFERRING PROVIDER: Richardean Sale  REFERRING DIAG:  [id number] (ICD-10-CM) - Acute pain of left knee   S82.045D (ICD-10-CM) - Closed nondisplaced comminuted fracture of left patella with routine healing, subsequent encounter    THERAPY DIAG:  Muscle weakness (generalized)  Other abnormalities of gait and mobility  Stiffness of left knee, not elsewhere classified  Localized edema  Rationale for Evaluation and Treatment: Rehabilitation  ONSET DATE: 05/19/22  SUBJECTIVE:   SUBJECTIVE STATEMENT: doing pretty good but my foot swelled up at night, it went down this morning. My wife had to get emergency surgery so I have not been able to do my exercises regularly. I go back to see my doctor tomorrow.    PERTINENT HISTORY: 05/19/2022 ED Patient presents with his son who assists with the history. Patient has had multiple episodes of prodromal syncope, has been seen by his physician, has had adjustments of his antihypertensives. His most recent episode was last month, resulting in a fall in which he broke his left knee. Today he notes he did not have a typical amount of food intake, had an episode of lightheadedness while upright, fell, no chest pain before or after the event. Since the event has had pain in his head, neck, soreness in his jaw. No confusion, disorientation, no broken teeth. Son notes the patient is interacting in a typical manner currently. .     05/22/2022 Patient states knee feels great, had a syncope episode was standing for a longtime , broken nose, decreased ROM neck , thinks the falling could be a result in changes in medicine   PAIN:  Are you having pain? No  PRECAUTIONS: Fall  WEIGHT BEARING RESTRICTIONS: Yes WBAT  FALLS:  Has patient fallen in last 6 months? Yes. Number of falls 2  LIVING ENVIRONMENT: Lives with: lives with their spouse Lives in: House/apartment Stairs: No Has following equipment at home: Single point cane, Environmental consultant - 2 wheeled, and Crutches  OCCUPATION: Semi-retired, run a Patent examiner business, sells insurance, and plays in a band  PLOF:  Independent  PATIENT GOALS: helping the knee heal, increase strength to support the knee  NEXT MD VISIT: 06/12/22  OBJECTIVE:   DIAGNOSTIC FINDINGS:  IMPRESSION: 1. No evidence of acute fracture or dislocation 2. Small suprapatellar joint effusion. 3. Remote patellar fracture.   Closed nondisplaced comminuted fracture of left patella with routine healing, subsequent encounter -Subacute, improving, complicated, subsequent visit - Interval healing of comminuted fracture of left patella based on HPI, physical exam, x-ray images from ER visit on 05/19/2022 after repeat fall - Patient is 6-7 weeks out from fall which caused comminuted left patella fracture.  He has no tenderness to palpation of patella and no pain with weightbearing with knee and hinged knee brace - May advance to weightbearing as tolerated.  Recommend continuing to use hinged knee brace   COGNITION: Overall cognitive status: Within functional limits for tasks assessed     SENSATION: WFL  EDEMA:  Some swelling in L quad  POSTURE: rounded shoulders and flexed trunk   PALPATION: TTP medial L knee and very tight in quads   LOWER EXTREMITY ROM:  Active ROM Right eval Left eval  Hip flexion  105  Hip extension    Hip abduction  Hip adduction    Hip internal rotation    Hip external rotation    Knee flexion  70  Knee extension  5  Ankle dorsiflexion    Ankle plantarflexion    Ankle inversion    Ankle eversion     (Blank rows = not tested)  LOWER EXTREMITY MMT:  MMT Right eval Left eval  Hip flexion  4  Hip extension    Hip abduction    Hip adduction    Hip internal rotation    Hip external rotation    Knee flexion  4+  Knee extension  4+  Ankle dorsiflexion    Ankle plantarflexion    Ankle inversion    Ankle eversion     (Blank rows = not tested)  FUNCTIONAL TESTS:  5 times sit to stand: 14.85s Timed up and go (TUG): 14.47s  GAIT: Distance walked: in clinic distances Assistive  device utilized: None Level of assistance: Modified independence Comments: antalgic gait, brace is locked at 40d of flexion and 10d extension   TODAY'S TREATMENT:                                                                                                                              DATE: 06/11/22 NuStep L4 x29mins  LAQ 2x10 HS curls red 2x10 HS stretch 30s x2 both sides  SAQ 2x10 STS from elevated mat 2x10 Seated towel slides LLE flex 2x10  06/07/22 No brace  Supine LLE quad sets SAQ LLE 2lv 2x10 LLE SLR x10 x5 LLE SLR w/ ER x5  LLE supine heel slides 2x10 Sit to stand from elevated mat 2x10 no UE support. Seated towel slides LLE flex 2x10     05/28/22: Brace removed for all of therex: Therapeutic exercise: Supine for quad sets, therapist with tactile cues L post knee  Supine for SLR tactile cues to engage quads throughout motion Nustep: 5 min 3 sec, UE's and LE's level 5 Seated for LAQ's, manual assist by PT for terminal 15 degrees of ext, with eccentric lowering 20x Seated L hamstring curls, blue band, 20x L knee flexion stretch in sitting, towel under L foot, to 92 degrees Standing for 3 way hip L 2# cuff weight 15 x Standing for towel slides L LE, F/B, S/S, CW/CCW, to fatigue Sit to stand from elevated mat 10 reps no UE support.  05/23/22- EVAL   PATIENT EDUCATION:  Education details: POC and HEP Person educated: Patient Education method: Explanation Education comprehension: verbalized understanding  HOME EXERCISE PROGRAM: Access Code: [email or web address] URL: [email or web address] Date: 05/23/2022 Prepared by: Cassie Freer  Exercises - Supine Heel Slide with Strap  - 1 x daily - 7 x weekly - 2 sets - 10 reps - Mini Squat with Counter Support  - 1 x daily - 7 x weekly - 2 sets - 10 reps - Heel Raises with Counter Support  - 1 x daily - 7 x weekly - 2 sets - 10  reps - Standing March with Counter Support  - 1 x daily - 7 x weekly - 2 sets - 10  reps  ASSESSMENT:  CLINICAL IMPRESSION: Patient returns with no pain in knee but complaints of tightness and weakness.  Session focuses on quad strength and gentle ROM. He is very weak in quads and hamstrings. He is unable to get full active TKE with LAQ may be due to weakness, tightness, and some swelling around knee. Also limited with knee flexion. States he feels better at the end of visit.   OBJECTIVE IMPAIRMENTS: decreased mobility, difficulty walking, and decreased ROM.   REHAB POTENTIAL: Good  CLINICAL DECISION MAKING: Stable/uncomplicated  EVALUATION COMPLEXITY: Low   GOALS: Goals reviewed with patient? Yes  SHORT TERM GOALS: Target date: 07/04/22  Patient will be independent with initial HEP. Goal status: INITIAL  2.  Patient will complete TUG < 12s  Baseline: 14.47s Goal status: INITIAL   LONG TERM GOALS: Target date: 08/15/22  Patient will be independent with advanced/ongoing HEP to improve outcomes and carryover.  Goal status: INITIAL  2.  Patient will demonstrate improved L knee AROM to >/= 0-120 deg to allow for normal gait and stair mechanics. Baseline: 5-0-70 Goal status: INITIAL  3.  Patient will demonstrate improved functional LE strength as demonstrated by 5xSTS < 12s without UE support. Baseline: 14.85 with UE push off Goal status: INITIAL  4.  Patient will be able to ambulate 600' with normal gait pattern without increased pain to access community.  Baseline: antalgic with brace on  Goal status: INITIAL  5. Patient will be able to ascend/descend stairs with 1 HR and reciprocal step pattern safely to access home and community.  Goal status: INITIAL   PLAN:  PT FREQUENCY: 2x/week  PT DURATION: 12 weeks  PLANNED INTERVENTIONS: Therapeutic exercises, Therapeutic activity, Neuromuscular re-education, Balance training, Gait training, Patient/Family education, Self Care, Joint mobilization, Stair training, Dry Needling, Electrical stimulation,  Cryotherapy, Moist heat, Vasopneumatic device, and Manual therapy  PLAN FOR NEXT SESSION: can change brace to lock at 60d flexion, AAROM/PROM for L knee flexion, strengthening as tolerated, gait training   Cassie Freer, PT 06/11/2022, 11:42 AM

## 2022-06-12 ENCOUNTER — Ambulatory Visit (INDEPENDENT_AMBULATORY_CARE_PROVIDER_SITE_OTHER): Payer: Medicare HMO

## 2022-06-12 ENCOUNTER — Ambulatory Visit: Payer: Medicare HMO | Admitting: Sports Medicine

## 2022-06-12 VITALS — BP 136/80 | HR 67 | Ht 74.0 in | Wt 199.0 lb

## 2022-06-12 DIAGNOSIS — M25562 Pain in left knee: Secondary | ICD-10-CM

## 2022-06-12 DIAGNOSIS — G8929 Other chronic pain: Secondary | ICD-10-CM

## 2022-06-12 DIAGNOSIS — S46819A Strain of other muscles, fascia and tendons at shoulder and upper arm level, unspecified arm, initial encounter: Secondary | ICD-10-CM

## 2022-06-12 DIAGNOSIS — M542 Cervicalgia: Secondary | ICD-10-CM

## 2022-06-12 DIAGNOSIS — S82045D Nondisplaced comminuted fracture of left patella, subsequent encounter for closed fracture with routine healing: Secondary | ICD-10-CM | POA: Diagnosis not present

## 2022-06-12 NOTE — Patient Instructions (Addendum)
Good to see you  Neck and trap HEP  PT referral  Can discontinue brace use , use as needed  Heating pad for neck  2 month follow up

## 2022-06-13 ENCOUNTER — Encounter: Payer: Self-pay | Admitting: Physical Therapy

## 2022-06-13 ENCOUNTER — Ambulatory Visit: Payer: Medicare HMO | Admitting: Physical Therapy

## 2022-06-13 DIAGNOSIS — M25611 Stiffness of right shoulder, not elsewhere classified: Secondary | ICD-10-CM | POA: Diagnosis not present

## 2022-06-13 DIAGNOSIS — M25511 Pain in right shoulder: Secondary | ICD-10-CM | POA: Diagnosis not present

## 2022-06-13 DIAGNOSIS — R293 Abnormal posture: Secondary | ICD-10-CM | POA: Diagnosis not present

## 2022-06-13 DIAGNOSIS — M25662 Stiffness of left knee, not elsewhere classified: Secondary | ICD-10-CM

## 2022-06-13 DIAGNOSIS — M6281 Muscle weakness (generalized): Secondary | ICD-10-CM | POA: Diagnosis not present

## 2022-06-13 DIAGNOSIS — R600 Localized edema: Secondary | ICD-10-CM | POA: Diagnosis not present

## 2022-06-13 DIAGNOSIS — R2689 Other abnormalities of gait and mobility: Secondary | ICD-10-CM | POA: Diagnosis not present

## 2022-06-13 DIAGNOSIS — M542 Cervicalgia: Secondary | ICD-10-CM | POA: Diagnosis not present

## 2022-06-13 NOTE — Therapy (Signed)
OUTPATIENT PHYSICAL THERAPY LOWER EXTREMITY TREATMENT   Patient Name: Charles Tate MRN: [id number] DOB:11/04/1950, 72 y.o., male Today's Date: 06/13/2022  END OF SESSION:  PT End of Session - 06/13/22 1552     Visit Number 5    Date for PT Re-Evaluation 08/15/22    PT Start Time 1543    PT Stop Time 1625    PT Time Calculation (min) 42 min    Activity Tolerance Patient tolerated treatment well    Behavior During Therapy WFL for tasks assessed/performed              Past Medical History:  Diagnosis Date   Allergy    Arthritis    left hip    GERD (gastroesophageal reflux disease)    per patient, resolved. 03/15/20   Heart murmur    adolescent rheumatic fever - developed murmur    Hyperlipidemia    Hypertension    RAD (reactive airway disease)    with RTIs   Tubular adenoma    Past Surgical History:  Procedure Laterality Date   COLONOSCOPY     colonoscopy with polypectomy  2013   Dr Noe Gens, [place]   COLONOSCOPY WITH PROPOFOL N/A 07/01/2019   Procedure: COLONOSCOPY WITH PROPOFOL;  Surgeon: Lemar Lofty., MD;  Location: [facility];  Service: Gastroenterology;  Laterality: N/A;   COLONOSCOPY WITH PROPOFOL N/A 05/30/2020   Procedure: COLONOSCOPY WITH PROPOFOL;  Surgeon: Meridee Score Netty Starring., MD;  Location: [facility];  Service: Gastroenterology;  Laterality: N/A;   ENDARTERECTOMY Right 04/11/2020   Procedure: Right Carotid Endarterectomy;  Surgeon: Sherren Kerns, MD;  Location: [facility] OR;  Service: Vascular;  Laterality: Right;   ENDOSCOPIC MUCOSAL RESECTION N/A 07/01/2019   Procedure: ENDOSCOPIC MUCOSAL RESECTION;  Surgeon: Meridee Score Netty Starring., MD;  Location: [facility];  Service: Gastroenterology;  Laterality: N/A;   ENDOSCOPIC MUCOSAL RESECTION N/A 05/30/2020   Procedure: ENDOSCOPIC MUCOSAL RESECTION;  Surgeon: Meridee Score Netty Starring., MD;  Location: [facility];  Service: Gastroenterology;  Laterality: N/A;   HEMOSTASIS CLIP PLACEMENT   07/01/2019   Procedure: HEMOSTASIS CLIP PLACEMENT;  Surgeon: Lemar Lofty., MD;  Location: [facility];  Service: Gastroenterology;;   POLYPECTOMY     POLYPECTOMY  07/01/2019   Procedure: POLYPECTOMY;  Surgeon: Lemar Lofty., MD;  Location: [facility];  Service: Gastroenterology;;   POLYPECTOMY  05/30/2020   Procedure: POLYPECTOMY;  Surgeon: Lemar Lofty., MD;  Location: [facility];  Service: Gastroenterology;;   Sunnie Nielsen LIFTING INJECTION  07/01/2019   Procedure: SUBMUCOSAL LIFTING INJECTION;  Surgeon: Lemar Lofty., MD;  Location: [facility];  Service: Gastroenterology;;   SUBMUCOSAL TATTOO INJECTION  07/01/2019   Procedure: SUBMUCOSAL TATTOO INJECTION;  Surgeon: Lemar Lofty., MD;  Location: [facility];  Service: Gastroenterology;;   WISDOM TOOTH EXTRACTION     Patient Active Problem List   Diagnosis Date Noted   Syncope 05/24/2022   Postnasal drip 05/23/2022   Sinus infection 03/19/2022   COVID 11/13/2021   Nuclear sclerotic cataract of both eyes 11/08/2020   Right shoulder pain 06/27/2020   S/P carotid endarterectomy, right 04/11/2020   Serrated adenoma of colon 03/12/2020   Aortic atherosclerosis 02/03/2020   Stenosis of right carotid artery 01/26/2020   Branch retinal artery occlusion, right eye 01/19/2020   Posterior vitreous detachment of both eyes 01/19/2020   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) 08/31/2019   Tear of MCL (medial collateral ligament) of knee, left, initial encounter 08/05/2017   Left inguinal hernia 07/04/2017  Allergic rhinitis 12/28/2015   H/O: rheumatic fever 12/28/2015   Prediabetes 12/27/2015   Rising PSA level 02/21/2014   Personal history of colonic polyps 02/17/2014   BPH with obstruction/lower urinary tract symptoms 05/04/2010   Hyperlipidemia 01/07/2009   Essential hypertension 09/26/2006    PCP: Cheryll Cockayne  REFERRING PROVIDER: Richardean Sale  REFERRING  DIAG:  [phone] (ICD-10-CM) - Acute pain of left knee  S82.045D (ICD-10-CM) - Closed nondisplaced comminuted fracture of left patella with routine healing, subsequent encounter    THERAPY DIAG:  Muscle weakness (generalized)  Other abnormalities of gait and mobility  Stiffness of left knee, not elsewhere classified  Localized edema  Stiffness of right shoulder, not elsewhere classified  Acute pain of right shoulder  Rationale for Evaluation and Treatment: Rehabilitation  ONSET DATE: 05/19/22  SUBJECTIVE:   SUBJECTIVE STATEMENT:  Patient had a Dr appointment yesterday and the brace was D/C'd. Dr also noted possible trapezius strain and possible whiplash from original fall. Patient notes no episodes of dizziness since his medications were adjusted for BP. Deferred neck assessment as patient was given an HEP for neck and wants to address knee since he had the brace D/C'd.   PERTINENT HISTORY: 05/19/2022 ED Patient presents with his son who assists with the history. Patient has had multiple episodes of prodromal syncope, has been seen by his physician, has had adjustments of his antihypertensives. His most recent episode was last month, resulting in a fall in which he broke his left knee. Today he notes he did not have a typical amount of food intake, had an episode of lightheadedness while upright, fell, no chest pain before or after the event. Since the event has had pain in his head, neck, soreness in his jaw. No confusion, disorientation, no broken teeth. Son notes the patient is interacting in a typical manner currently. .     05/22/2022 Patient states knee feels great, had a syncope episode was standing for a longtime , broken nose, decreased ROM neck , thinks the falling could be a result in changes in medicine   PAIN:  Are you having pain? No  PRECAUTIONS: Fall  WEIGHT BEARING RESTRICTIONS: Yes WBAT  FALLS:  Has patient fallen in last 6 months? Yes. Number of falls  2  LIVING ENVIRONMENT: Lives with: lives with their spouse Lives in: House/apartment Stairs: No Has following equipment at home: Single point cane, Environmental consultant - 2 wheeled, and Crutches  OCCUPATION: Semi-retired, run a Patent examiner business, sells insurance, and plays in a band  PLOF: Independent  PATIENT GOALS: helping the knee heal, increase strength to support the knee  NEXT MD VISIT: 06/12/22  OBJECTIVE:   DIAGNOSTIC FINDINGS:  IMPRESSION: 1. No evidence of acute fracture or dislocation 2. Small suprapatellar joint effusion. 3. Remote patellar fracture.   Closed nondisplaced comminuted fracture of left patella with routine healing, subsequent encounter -Subacute, improving, complicated, subsequent visit - Interval healing of comminuted fracture of left patella based on HPI, physical exam, x-ray images from ER visit on 05/19/2022 after repeat fall - Patient is 6-7 weeks out from fall which caused comminuted left patella fracture.  He has no tenderness to palpation of patella and no pain with weightbearing with knee and hinged knee brace - May advance to weightbearing as tolerated.  Recommend continuing to use hinged knee brace   COGNITION: Overall cognitive status: Within functional limits for tasks assessed     SENSATION: WFL  EDEMA:  Some swelling in L quad  POSTURE: rounded shoulders and  flexed trunk   PALPATION: TTP medial L knee and very tight in quads   LOWER EXTREMITY ROM:  Active ROM Right eval Left eval  Hip flexion  105  Hip extension    Hip abduction    Hip adduction    Hip internal rotation    Hip external rotation    Knee flexion  70  Knee extension  5  Ankle dorsiflexion    Ankle plantarflexion    Ankle inversion    Ankle eversion     (Blank rows = not tested)  LOWER EXTREMITY MMT:  MMT Right eval Left eval  Hip flexion  4  Hip extension    Hip abduction    Hip adduction    Hip internal rotation    Hip external rotation    Knee  flexion  4+  Knee extension  4+  Ankle dorsiflexion    Ankle plantarflexion    Ankle inversion    Ankle eversion     (Blank rows = not tested)  FUNCTIONAL TESTS:  5 times sit to stand: 14.85s Timed up and go (TUG): 14.47s  GAIT: Distance walked: in clinic distances Assistive device utilized: None Level of assistance: Modified independence Comments: antalgic gait, brace is locked at 40d of flexion and 10d extension   TODAY'S TREATMENT:                                                                                                                              DATE: 06/13/22 NuStep L4 x 6 minutes. Supine SLR x 10, repeat with hip ER x 6 Supine knee press with heel elevated, 10 x 5 sec Gentle patellar mobs, lateral and distal stretch Seated knee flexion ROM, Active flexion ROM- 100 Step ups onto 4" step, forward and side step x 10 each. B side to side stepping with R tband at knees, 2 x 10 reps Eccentric heel raises on floor Alternately tapping 8" cone with each foot while balancing on the opposite foot, 10 reps each, no UE support  06/11/22 NuStep L4 x55mins  LAQ 2x10 HS curls red 2x10 HS stretch 30s x2 both sides  SAQ 2x10 STS from elevated mat 2x10 Seated towel slides LLE flex 2x10  06/07/22 No brace  Supine LLE quad sets SAQ LLE 2lv 2x10 LLE SLR x10 x5 LLE SLR w/ ER x5  LLE supine heel slides 2x10 Sit to stand from elevated mat 2x10 no UE support. Seated towel slides LLE flex 2x10   05/28/22: Brace removed for all of therex: Therapeutic exercise: Supine for quad sets, therapist with tactile cues L post knee  Supine for SLR tactile cues to engage quads throughout motion Nustep: 5 min 3 sec, UE's and LE's level 5 Seated for LAQ's, manual assist by PT for terminal 15 degrees of ext, with eccentric lowering 20x Seated L hamstring curls, blue band, 20x L knee flexion stretch in sitting, towel under L foot, to 92 degrees Standing for 3  way hip L 2# cuff weight 15  x Standing for towel slides L LE, F/B, S/S, CW/CCW, to fatigue Sit to stand from elevated mat 10 reps no UE support.  05/23/22- EVAL   PATIENT EDUCATION:  Education details: POC and HEP Person educated: Patient Education method: Explanation Education comprehension: verbalized understanding  HOME EXERCISE PROGRAM: Access Code: [email or web address] URL: [email or web address] Date: 05/23/2022 Prepared by: Cassie FreerMona Sajjad  Exercises - Supine Heel Slide with Strap  - 1 x daily - 7 x weekly - 2 sets - 10 reps - Mini Squat with Counter Support  - 1 x daily - 7 x weekly - 2 sets - 10 reps - Heel Raises with Counter Support  - 1 x daily - 7 x weekly - 2 sets - 10 reps - Standing March with Counter Support  - 1 x daily - 7 x weekly - 2 sets - 10 reps  ASSESSMENT:  CLINICAL IMPRESSION: Patient returns with no pain in knee but complaints of tightness and weakness.Dr D/C'd knee brace and cleared him for stretching and strengthening. Also noted some neck pain and possible whiplash. Patient denies dizziness since his BP meds were adjusted, requested to work on his knee to update HEP. Neck assessment deferred and patient will perform the HEP given him by the Dr. Check back on next visit to see if he needs further assessment. LLE is showing improved strength and knee ROM.  OBJECTIVE IMPAIRMENTS: decreased mobility, difficulty walking, and decreased ROM.   REHAB POTENTIAL: Good  CLINICAL DECISION MAKING: Stable/uncomplicated  EVALUATION COMPLEXITY: Low   GOALS: Goals reviewed with patient? Yes  SHORT TERM GOALS: Target date: 07/04/22  Patient will be independent with initial HEP. Goal status: 06/13/22-met  2.  Patient will complete TUG < 12s  Baseline: 14.47s Goal status: INITIAL   LONG TERM GOALS: Target date: 08/15/22  Patient will be independent with advanced/ongoing HEP to improve outcomes and carryover.  Goal status: INITIAL  2.  Patient will demonstrate improved L knee AROM to  >/= 0-120 deg to allow for normal gait and stair mechanics. Baseline: 5-0-70 Goal status: 06/13/22- 5-100 degrees, ongoing  3.  Patient will demonstrate improved functional LE strength as demonstrated by 5xSTS < 12s without UE support. Baseline: 14.85 with UE push off Goal status: INITIAL  4.  Patient will be able to ambulate 600' with normal gait pattern without increased pain to access community.  Baseline: antalgic with brace on  Goal status: INITIAL  5. Patient will be able to ascend/descend stairs with 1 HR and reciprocal step pattern safely to access home and community.  Goal status: INITIAL   PLAN:  PT FREQUENCY: 2x/week  PT DURATION: 12 weeks  PLANNED INTERVENTIONS: Therapeutic exercises, Therapeutic activity, Neuromuscular re-education, Balance training, Gait training, Patient/Family education, Self Care, Joint mobilization, Stair training, Dry Needling, Electrical stimulation, Cryotherapy, Moist heat, Vasopneumatic device, and Manual therapy  PLAN FOR NEXT SESSION: Brace D/C'd. Check on how his neck feels.   Oley BalmSusan Warnie Belair DPT 06/13/22 4:31 PM

## 2022-06-14 ENCOUNTER — Ambulatory Visit: Payer: Medicare HMO | Admitting: Physical Therapy

## 2022-06-18 ENCOUNTER — Ambulatory Visit: Payer: Medicare HMO

## 2022-06-20 NOTE — Therapy (Signed)
OUTPATIENT PHYSICAL THERAPY CERVICAL EVALUATION   Patient Name: Charles Tate MRN: [id number] DOB:[birth year], 72 y.o., male Today's Date: 06/21/2022  END OF SESSION:  PT End of Session - 06/21/22 1103     Visit Number 1    Date for PT Re-Evaluation 08/30/22    PT Start Time 1100    PT Stop Time 1145    PT Time Calculation (min) 45 min    Activity Tolerance Patient tolerated treatment well    Behavior During Therapy WFL for tasks assessed/performed             Past Medical History:  Diagnosis Date   Allergy    Arthritis    left hip    GERD (gastroesophageal reflux disease)    per patient, resolved. 03/15/20   Heart murmur    adolescent rheumatic fever - developed murmur    Hyperlipidemia    Hypertension    RAD (reactive airway disease)    with RTIs   Tubular adenoma    Past Surgical History:  Procedure Laterality Date   COLONOSCOPY     colonoscopy with polypectomy  2013   Dr Noe Gens, [place]   COLONOSCOPY WITH PROPOFOL N/A 07/01/2019   Procedure: COLONOSCOPY WITH PROPOFOL;  Surgeon: Lemar Lofty., MD;  Location: [facility];  Service: Gastroenterology;  Laterality: N/A;   COLONOSCOPY WITH PROPOFOL N/A 05/30/2020   Procedure: COLONOSCOPY WITH PROPOFOL;  Surgeon: Meridee Score Netty Starring., MD;  Location: [facility];  Service: Gastroenterology;  Laterality: N/A;   ENDARTERECTOMY Right 04/11/2020   Procedure: Right Carotid Endarterectomy;  Surgeon: Sherren Kerns, MD;  Location: [facility];  Service: Vascular;  Laterality: Right;   ENDOSCOPIC MUCOSAL RESECTION N/A 07/01/2019   Procedure: ENDOSCOPIC MUCOSAL RESECTION;  Surgeon: Meridee Score Netty Starring., MD;  Location: [facility];  Service: Gastroenterology;  Laterality: N/A;   ENDOSCOPIC MUCOSAL RESECTION N/A 05/30/2020   Procedure: ENDOSCOPIC MUCOSAL RESECTION;  Surgeon: Meridee Score Netty Starring., MD;  Location: [facility];  Service: Gastroenterology;  Laterality: N/A;   HEMOSTASIS CLIP PLACEMENT  07/01/2019    Procedure: HEMOSTASIS CLIP PLACEMENT;  Surgeon: Lemar Lofty., MD;  Location: [facility];  Service: Gastroenterology;;   POLYPECTOMY     POLYPECTOMY  07/01/2019   Procedure: POLYPECTOMY;  Surgeon: Lemar Lofty., MD;  Location: [facility];  Service: Gastroenterology;;   POLYPECTOMY  05/30/2020   Procedure: POLYPECTOMY;  Surgeon: Lemar Lofty., MD;  Location: [facility];  Service: Gastroenterology;;   Sunnie Nielsen LIFTING INJECTION  07/01/2019   Procedure: SUBMUCOSAL LIFTING INJECTION;  Surgeon: Lemar Lofty., MD;  Location: [facility];  Service: Gastroenterology;;   SUBMUCOSAL TATTOO INJECTION  07/01/2019   Procedure: SUBMUCOSAL TATTOO INJECTION;  Surgeon: Lemar Lofty., MD;  Location: [facility];  Service: Gastroenterology;;   WISDOM TOOTH EXTRACTION     Patient Active Problem List   Diagnosis Date Noted   Syncope 05/24/2022   Postnasal drip 05/23/2022   Sinus infection 03/19/2022   COVID 11/13/2021   Nuclear sclerotic cataract of both eyes 11/08/2020   Right shoulder pain 06/27/2020   S/P carotid endarterectomy, right 04/11/2020   Serrated adenoma of colon 03/12/2020   Aortic atherosclerosis 02/03/2020   Stenosis of right carotid artery 01/26/2020   Branch retinal artery occlusion, right eye 01/19/2020   Posterior vitreous detachment of both eyes 01/19/2020   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) 08/31/2019   Tear of MCL (medial collateral ligament) of knee, left, initial encounter 08/05/2017   Left inguinal hernia 07/04/2017  Allergic rhinitis 12/28/2015   H/O: rheumatic fever 12/28/2015   Prediabetes 12/27/2015   Rising PSA level 02/21/2014   Personal history of colonic polyps 02/17/2014   BPH with obstruction/lower urinary tract symptoms 05/04/2010   Hyperlipidemia 01/07/2009   Essential hypertension 09/26/2006    PCP: Richardean Sale  REFERRING PROVIDER: Richardean Sale  REFERRING DIAG:   M54.2 (ICD-10-CM) - Neck pain  S46.819S (ICD-10-CM) - Strain of trapezius muscle, unspecified laterality, sequela    THERAPY DIAG:  No diagnosis found.  Rationale for Evaluation and Treatment: Rehabilitation  ONSET DATE: 05/19/22  SUBJECTIVE:                                                                                                                                                                                                         SUBJECTIVE STATEMENT: I fell on March 16th and face planted, my neck has been hurting since and I can't turn my head without it hurting. I think it is getting better.   Hand dominance: Right  PERTINENT HISTORY:  Strain of trapezius muscle, unspecified laterality, initial encounter -Acute, initial visit - Patient continues to have muscular strains and pains from fall on 05/19/2022 - Reassuring that patient had unremarkable CT cervical spine at ER visit on 05/19/2022 - Patient is likely experiencing whiplash like muscular strains and tension resulting from fall  Closed nondisplaced comminuted fracture of left patella with routine healing, subsequent encounter -Subacute, improving, complicated, subsequent visit - X-ray obtained in clinic.  My interpretation: Interval healing of comminuted fracture of left patella - Patient has significantly improved since fall on 04/05/2022 causing comminuted left knee patellar fracture.  Fracture was treated conservatively with knee immobilization and gradual return to weightbearing as tolerated.  Patient is currently able to ambulate nearly pain-free - Continue physical therapy for stretching and strengthening of quadriceps, hamstrings which have become deconditioned due to our immobilization - May discontinue brace use and use it as needed.  May continue weightbearing as tolerated - May use rest ice compression elevation, Tylenol as needed for pain relief  PAIN:  Are you having pain? Yes: NPRS scale: 4/10 Pain  location: neck and traps Pain description: ache, pulling Aggravating factors: turning head  Relieving factors: Advil, Tylenol  PRECAUTIONS: None  WEIGHT BEARING RESTRICTIONS: No  FALLS:  Has patient fallen in last 6 months? Yes. Number of falls 2  LIVING ENVIRONMENT: Lives with: lives with their family and lives with their spouse Lives in: House/apartment Stairs: No Has following equipment at home: Single point cane, Environmental consultant - 2 wheeled, and Crutches  OCCUPATION:  Semi-retired, run a Patent examiner business, sells insurance, and plays in a band   PLOF: Independent  PATIENT GOALS:  be able to turn my head left and right without pain, helping the knee heal, increase strength to support the knee   NEXT MD VISIT: 08/13/22  OBJECTIVE:   DIAGNOSTIC FINDINGS:  Alignment: Normal.   Skull base and vertebrae: No acute fracture. No primary bone lesion or focal pathologic process.   Soft tissues and spinal canal: No prevertebral fluid or swelling. No visible canal hematoma.   Disc levels: Mild anterior bridging osteophytosis of the lower cervical levels as well as mild disc space height loss and osteophytosis of C3-C4, otherwise intact.   Upper chest: Negative.   Other: None.   IMPRESSION: 1. No acute intracranial pathology. 2. Suspect nondisplaced fractures of the nasal bones. No displaced fractures or dislocations of the facial bones. 3. No fracture or static subluxation of the cervical spine.  COGNITION: Overall cognitive status: Within functional limits for tasks assessed  SENSATION: WFL  POSTURE: rounded shoulders  PALPATION: TTP and trigger points along bilateral upper traps    CERVICAL ROM:   Active ROM A/PROM (deg) eval  Flexion WFL  Extension 50% w/pain  Right lateral flexion Mod tightness with pain  Left lateral flexion Very tight with pain  Right rotation 50% with pain  Left rotation 50% with pain   (Blank rows = not tested)  UPPER EXTREMITY ROM:  grossly WFL   UPPER EXTREMITY MMT: grossly 5/5   L KNEE ROM: knee extension 10d, knee flexion 98d  FUNCTIONAL TESTS:  5 times sit to stand: 20.88s  TODAY'S TREATMENT:                                                                                                                              DATE: 06/21/22- EVAL   PATIENT EDUCATION:  Education details: HEP and POC Person educated: Patient Education method: Explanation Education comprehension: verbalized understanding  HOME EXERCISE PROGRAM: Access Code: [email or web address] URL: [email or web address] Date: 06/21/2022 Prepared by: Cassie Freer  Exercises - Seated Upper Trapezius Stretch  - 2 x daily - 7 x weekly - 30 hold - Seated Levator Scapulae Stretch  - 2 x daily - 7 x weekly - 30 hold - Doorway Pec Stretch at 90 Degrees Abduction  - 2 x daily - 7 x weekly - 30 hold -Self massage with Tennis Ball for trigger point release   ASSESSMENT:  CLINICAL IMPRESSION: Patient is a 72 y.o. male who was seen today for physical therapy evaluation and treatment for neck pain. He presents with pain with head movements and with palpation. He has a possible strain in his trapezius and whiplash from the fall. He is a returning patient who was coming for a patellar fx from the same fall. He continues to have some ROM limitations and pain in his L knee. We will combine his previous goals with the new ones for the neck. He  will benefit from skilled PT to address his pain and impairments.   OBJECTIVE IMPAIRMENTS: decreased ROM, increased fascial restrictions, impaired flexibility, and pain.   REHAB POTENTIAL: Good  CLINICAL DECISION MAKING: Stable/uncomplicated  EVALUATION COMPLEXITY: Low   GOALS: Goals reviewed with patient? Yes  SHORT TERM GOALS: Target date: 07/26/22  Patient will be independent with initial HEP.  Goal status: INITIAL  2.   Patient will demonstrate improved functional LE strength as demonstrated by 5xSTS < 12s  without UE support. Baseline: 20.88s with UE push off and pain Goal status: INITIAL   LONG TERM GOALS: Target date: 08/30/22  Patient will be independent with advanced/ongoing HEP to improve outcomes and carryover.  Goal status: INITIAL  2.  Patient will report 75% improvement in neck pain to improve QOL.  Baseline: 4-8/10 with movement Goal status: INITIAL  3.  Patient will demonstrate full pain free cervical ROM for safety with driving.  Baseline: see chart above Goal status: INITIAL  4. Patient will demonstrate improved L knee AROM to >/= 0-120 deg to allow for normal gait and stair mechanics. Baseline: 10-0-98 Goal status: INITIAL  PLAN:  PT FREQUENCY: 2x/week  PT DURATION: 10 weeks  PLANNED INTERVENTIONS: Therapeutic exercises, Therapeutic activity, Neuromuscular re-education, Balance training, Gait training, Patient/Family education, Self Care, Joint mobilization, Dry Needling, Cryotherapy, Moist heat, Vasopneumatic device, Traction, and Manual therapy  PLAN FOR NEXT SESSION: Dry needling for bilateral UT, stretching neck, work on knee strengthening and stretching too   [employer], PT 06/21/2022, 11:59 AM

## 2022-06-21 ENCOUNTER — Ambulatory Visit: Payer: Medicare HMO

## 2022-06-21 DIAGNOSIS — R2689 Other abnormalities of gait and mobility: Secondary | ICD-10-CM | POA: Diagnosis not present

## 2022-06-21 DIAGNOSIS — R293 Abnormal posture: Secondary | ICD-10-CM

## 2022-06-21 DIAGNOSIS — M542 Cervicalgia: Secondary | ICD-10-CM

## 2022-06-21 DIAGNOSIS — M25662 Stiffness of left knee, not elsewhere classified: Secondary | ICD-10-CM | POA: Diagnosis not present

## 2022-06-21 DIAGNOSIS — M6281 Muscle weakness (generalized): Secondary | ICD-10-CM

## 2022-06-21 DIAGNOSIS — R600 Localized edema: Secondary | ICD-10-CM | POA: Diagnosis not present

## 2022-06-21 DIAGNOSIS — M25511 Pain in right shoulder: Secondary | ICD-10-CM | POA: Diagnosis not present

## 2022-06-21 DIAGNOSIS — M25611 Stiffness of right shoulder, not elsewhere classified: Secondary | ICD-10-CM | POA: Diagnosis not present

## 2022-06-25 ENCOUNTER — Ambulatory Visit: Payer: Medicare HMO

## 2022-06-26 ENCOUNTER — Encounter: Payer: Self-pay | Admitting: Physical Therapy

## 2022-06-26 ENCOUNTER — Ambulatory Visit: Payer: Medicare HMO | Admitting: Physical Therapy

## 2022-06-26 DIAGNOSIS — R293 Abnormal posture: Secondary | ICD-10-CM

## 2022-06-26 DIAGNOSIS — M542 Cervicalgia: Secondary | ICD-10-CM

## 2022-06-26 DIAGNOSIS — R2689 Other abnormalities of gait and mobility: Secondary | ICD-10-CM | POA: Diagnosis not present

## 2022-06-26 DIAGNOSIS — M25662 Stiffness of left knee, not elsewhere classified: Secondary | ICD-10-CM

## 2022-06-26 DIAGNOSIS — M6281 Muscle weakness (generalized): Secondary | ICD-10-CM | POA: Diagnosis not present

## 2022-06-26 DIAGNOSIS — R600 Localized edema: Secondary | ICD-10-CM | POA: Diagnosis not present

## 2022-06-26 DIAGNOSIS — M25511 Pain in right shoulder: Secondary | ICD-10-CM | POA: Diagnosis not present

## 2022-06-26 DIAGNOSIS — M25611 Stiffness of right shoulder, not elsewhere classified: Secondary | ICD-10-CM | POA: Diagnosis not present

## 2022-06-26 NOTE — Therapy (Signed)
OUTPATIENT PHYSICAL THERAPY CERVICAL EVALUATION   Patient Name: Charles Tate MRN: [id number] DOB:03/26/1950, 72 y.o., male Today's Date: 06/26/2022  END OF SESSION:  PT End of Session - 06/26/22 0801     Visit Number 2    Date for PT Re-Evaluation 08/30/22    PT Start Time 0800    PT Stop Time 0845    PT Time Calculation (min) 45 min    Activity Tolerance Patient tolerated treatment well    Behavior During Therapy [facility] for tasks assessed/performed             Past Medical History:  Diagnosis Date   Allergy    Arthritis    left hip    GERD (gastroesophageal reflux disease)    per patient, resolved. 03/15/20   Heart murmur    adolescent rheumatic fever - developed murmur    Hyperlipidemia    Hypertension    RAD (reactive airway disease)    with RTIs   Tubular adenoma    Past Surgical History:  Procedure Laterality Date   COLONOSCOPY     colonoscopy with polypectomy  2013   Dr Noe Gens, [place]   COLONOSCOPY WITH PROPOFOL N/A 07/01/2019   Procedure: COLONOSCOPY WITH PROPOFOL;  Surgeon: Lemar Lofty., MD;  Location: [facility];  Service: Gastroenterology;  Laterality: N/A;   COLONOSCOPY WITH PROPOFOL N/A 05/30/2020   Procedure: COLONOSCOPY WITH PROPOFOL;  Surgeon: Meridee Score Netty Starring., MD;  Location: [facility];  Service: Gastroenterology;  Laterality: N/A;   ENDARTERECTOMY Right 04/11/2020   Procedure: Right Carotid Endarterectomy;  Surgeon: Sherren Kerns, MD;  Location: [facility] OR;  Service: Vascular;  Laterality: Right;   ENDOSCOPIC MUCOSAL RESECTION N/A 07/01/2019   Procedure: ENDOSCOPIC MUCOSAL RESECTION;  Surgeon: Meridee Score Netty Starring., MD;  Location: [facility];  Service: Gastroenterology;  Laterality: N/A;   ENDOSCOPIC MUCOSAL RESECTION N/A 05/30/2020   Procedure: ENDOSCOPIC MUCOSAL RESECTION;  Surgeon: Meridee Score Netty Starring., MD;  Location: [facility];  Service: Gastroenterology;  Laterality: N/A;   HEMOSTASIS CLIP PLACEMENT  07/01/2019    Procedure: HEMOSTASIS CLIP PLACEMENT;  Surgeon: Lemar Lofty., MD;  Location: [facility];  Service: Gastroenterology;;   POLYPECTOMY     POLYPECTOMY  07/01/2019   Procedure: POLYPECTOMY;  Surgeon: Lemar Lofty., MD;  Location: [facility];  Service: Gastroenterology;;   POLYPECTOMY  05/30/2020   Procedure: POLYPECTOMY;  Surgeon: Lemar Lofty., MD;  Location: [facility];  Service: Gastroenterology;;   Sunnie Nielsen LIFTING INJECTION  07/01/2019   Procedure: SUBMUCOSAL LIFTING INJECTION;  Surgeon: Lemar Lofty., MD;  Location: [facility];  Service: Gastroenterology;;   SUBMUCOSAL TATTOO INJECTION  07/01/2019   Procedure: SUBMUCOSAL TATTOO INJECTION;  Surgeon: Lemar Lofty., MD;  Location: [facility];  Service: Gastroenterology;;   WISDOM TOOTH EXTRACTION     Patient Active Problem List   Diagnosis Date Noted   Syncope 05/24/2022   Postnasal drip 05/23/2022   Sinus infection 03/19/2022   COVID 11/13/2021   Nuclear sclerotic cataract of both eyes 11/08/2020   Right shoulder pain 06/27/2020   S/P carotid endarterectomy, right 04/11/2020   Serrated adenoma of colon 03/12/2020   Aortic atherosclerosis 02/03/2020   Stenosis of right carotid artery 01/26/2020   Branch retinal artery occlusion, right eye 01/19/2020   Posterior vitreous detachment of both eyes 01/19/2020   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) 08/31/2019   Tear of MCL (medial collateral ligament) of knee, left, initial encounter 08/05/2017   Left inguinal hernia 07/04/2017  Allergic rhinitis 12/28/2015   H/O: rheumatic fever 12/28/2015   Prediabetes 12/27/2015   Rising PSA level 02/21/2014   Personal history of colonic polyps 02/17/2014   BPH with obstruction/lower urinary tract symptoms 05/04/2010   Hyperlipidemia 01/07/2009   Essential hypertension 09/26/2006    PCP: Richardean Sale  REFERRING PROVIDER: Richardean Sale  REFERRING DIAG:   M54.2 (ICD-10-CM) - Neck pain  S46.819S (ICD-10-CM) - Strain of trapezius muscle, unspecified laterality, sequela    THERAPY DIAG:  Cervicalgia  Muscle weakness (generalized)  Abnormal posture  Stiffness of left knee, not elsewhere classified  Rationale for Evaluation and Treatment: Rehabilitation  ONSET DATE: 05/19/22  SUBJECTIVE:                                                                                                                                                                                                         SUBJECTIVE STATEMENT: Knee feels weird, loose some times and tightens ups. Neck is a little bit better  Hand dominance: Right  PERTINENT HISTORY:  Strain of trapezius muscle, unspecified laterality, initial encounter -Acute, initial visit - Patient continues to have muscular strains and pains from fall on 05/19/2022 - Reassuring that patient had unremarkable CT cervical spine at ER visit on 05/19/2022 - Patient is likely experiencing whiplash like muscular strains and tension resulting from fall  Closed nondisplaced comminuted fracture of left patella with routine healing, subsequent encounter -Subacute, improving, complicated, subsequent visit - X-ray obtained in clinic.  My interpretation: Interval healing of comminuted fracture of left patella - Patient has significantly improved since fall on 04/05/2022 causing comminuted left knee patellar fracture.  Fracture was treated conservatively with knee immobilization and gradual return to weightbearing as tolerated.  Patient is currently able to ambulate nearly pain-free - Continue physical therapy for stretching and strengthening of quadriceps, hamstrings which have become deconditioned due to our immobilization - May discontinue brace use and use it as needed.  May continue weightbearing as tolerated - May use rest ice compression elevation, Tylenol as needed for pain relief  PAIN:  Are you having pain?  Yes: NPRS scale: 2/10 Pain location: neck and traps Pain description: ache, pulling Aggravating factors: turning head  Relieving factors: Advil, Tylenol  PRECAUTIONS: None  WEIGHT BEARING RESTRICTIONS: No  FALLS:  Has patient fallen in last 6 months? Yes. Number of falls 2  LIVING ENVIRONMENT: Lives with: lives with their family and lives with their spouse Lives in: House/apartment Stairs: No Has following equipment at home: Single point cane, Environmental consultant - 2 wheeled, and Crutches  OCCUPATION: Semi-retired, run  a carpet cleaning business, sells insurance, and plays in a band   PLOF: Independent  PATIENT GOALS:  be able to turn my head left and right without pain, helping the knee heal, increase strength to support the knee   NEXT MD VISIT: 08/13/22  OBJECTIVE:   DIAGNOSTIC FINDINGS:  Alignment: Normal.   Skull base and vertebrae: No acute fracture. No primary bone lesion or focal pathologic process.   Soft tissues and spinal canal: No prevertebral fluid or swelling. No visible canal hematoma.   Disc levels: Mild anterior bridging osteophytosis of the lower cervical levels as well as mild disc space height loss and osteophytosis of C3-C4, otherwise intact.   Upper chest: Negative.   Other: None.   IMPRESSION: 1. No acute intracranial pathology. 2. Suspect nondisplaced fractures of the nasal bones. No displaced fractures or dislocations of the facial bones. 3. No fracture or static subluxation of the cervical spine.  COGNITION: Overall cognitive status: Within functional limits for tasks assessed  SENSATION: WFL  POSTURE: rounded shoulders  PALPATION: TTP and trigger points along bilateral upper traps    CERVICAL ROM:   Active ROM A/PROM (deg) eval  Flexion WFL  Extension 50% w/pain  Right lateral flexion Mod tightness with pain  Left lateral flexion Very tight with pain  Right rotation 50% with pain  Left rotation 50% with pain   (Blank rows = not  tested)  UPPER EXTREMITY ROM: grossly WFL   UPPER EXTREMITY MMT: grossly 5/5   L KNEE ROM: knee extension 10d, knee flexion 98d  FUNCTIONAL TESTS:  5 times sit to stand: 20.88s  TODAY'S TREATMENT:                                                                                                                              DATE: 06/26/22 NuStep L 5 x 6 min  MT STM to UT & Cervical spine  PROM to cervical spine with end range holds Leg press 30lb 3x10 Shoulder Ext 10lb standing on Airex 2x10 Standing rows 10lb 2x10  6in step ups x10 each  Shoulder ER red 2x10 S2S OHP yellow ball x10   06/21/22- EVAL   PATIENT EDUCATION:  Education details: HEP and POC Person educated: Patient Education method: Explanation Education comprehension: verbalized understanding  HOME EXERCISE PROGRAM: Access Code: [email or web address] URL: [email or web address] Date: 06/21/2022 Prepared by: Cassie Freer  Exercises - Seated Upper Trapezius Stretch  - 2 x daily - 7 x weekly - 30 hold - Seated Levator Scapulae Stretch  - 2 x daily - 7 x weekly - 30 hold - Doorway Pec Stretch at 90 Degrees Abduction  - 2 x daily - 7 x weekly - 30 hold -Self massage with Tennis Ball for trigger point release   ASSESSMENT:  CLINICAL IMPRESSION: Patient is a 72 y.o. male who was seen today for physical therapy  treatment for neck pain. He presents with pain with head movements and with palpation. Session addressed  neck and L knee.  TP noted in upper L trap with STM. Positive response to MT evident by improved tissue elasticity and mobility. Pt reported he could feel it in his knee with addition of new interventions. Cues for eccentric control needed on leg press. He will benefit from skilled PT to address his pain and impairments.   OBJECTIVE IMPAIRMENTS: decreased ROM, increased fascial restrictions, impaired flexibility, and pain.   REHAB POTENTIAL: Good  CLINICAL DECISION MAKING:  Stable/uncomplicated  EVALUATION COMPLEXITY: Low   GOALS: Goals reviewed with patient? Yes  SHORT TERM GOALS: Target date: 07/26/22  Patient will be independent with initial HEP.  Goal status: INITIAL  2.   Patient will demonstrate improved functional LE strength as demonstrated by 5xSTS < 12s without UE support. Baseline: 20.88s with UE push off and pain Goal status: ongoing   LONG TERM GOALS: Target date: 08/30/22  Patient will be independent with advanced/ongoing HEP to improve outcomes and carryover.  Goal status: INITIAL  2.  Patient will report 75% improvement in neck pain to improve QOL.  Baseline: 4-8/10 with movement Goal status: INITIAL  3.  Patient will demonstrate full pain free cervical ROM for safety with driving.  Baseline: see chart above Goal status: INITIAL  4. Patient will demonstrate improved L knee AROM to >/= 0-120 deg to allow for normal gait and stair mechanics. Baseline: 10-0-98 Goal status: INITIAL  PLAN:  PT FREQUENCY: 2x/week  PT DURATION: 10 weeks  PLANNED INTERVENTIONS: Therapeutic exercises, Therapeutic activity, Neuromuscular re-education, Balance training, Gait training, Patient/Family education, Self Care, Joint mobilization, Dry Needling, Cryotherapy, Moist heat, Vasopneumatic device, Traction, and Manual therapy  PLAN FOR NEXT SESSION: Dry needling for bilateral UT, stretching neck, work on knee strengthening and stretching too   Grayce Sessions, PTA 06/26/2022, 8:02 AM

## 2022-06-28 ENCOUNTER — Ambulatory Visit: Payer: Medicare HMO

## 2022-06-28 DIAGNOSIS — R293 Abnormal posture: Secondary | ICD-10-CM | POA: Diagnosis not present

## 2022-06-28 DIAGNOSIS — M542 Cervicalgia: Secondary | ICD-10-CM

## 2022-06-28 DIAGNOSIS — M25511 Pain in right shoulder: Secondary | ICD-10-CM | POA: Diagnosis not present

## 2022-06-28 DIAGNOSIS — R2689 Other abnormalities of gait and mobility: Secondary | ICD-10-CM | POA: Diagnosis not present

## 2022-06-28 DIAGNOSIS — R600 Localized edema: Secondary | ICD-10-CM | POA: Diagnosis not present

## 2022-06-28 DIAGNOSIS — M25611 Stiffness of right shoulder, not elsewhere classified: Secondary | ICD-10-CM | POA: Diagnosis not present

## 2022-06-28 DIAGNOSIS — M25662 Stiffness of left knee, not elsewhere classified: Secondary | ICD-10-CM | POA: Diagnosis not present

## 2022-06-28 DIAGNOSIS — M6281 Muscle weakness (generalized): Secondary | ICD-10-CM

## 2022-06-28 NOTE — Therapy (Signed)
OUTPATIENT PHYSICAL THERAPY CERVICAL TREATMENT   Patient Name: Charles Tate MRN: [id number] DOB:[birth year], 72 y.o., male Today's Date: 06/28/2022  END OF SESSION:  PT End of Session - 06/28/22 1102     Visit Number 3    Date for PT Re-Evaluation 08/30/22    PT Start Time 1100    PT Stop Time 1145    PT Time Calculation (min) 45 min    Activity Tolerance Patient tolerated treatment well    Behavior During Therapy WFL for tasks assessed/performed              Past Medical History:  Diagnosis Date   Allergy    Arthritis    left hip    GERD (gastroesophageal reflux disease)    per patient, resolved. 03/15/20   Heart murmur    adolescent rheumatic fever - developed murmur    Hyperlipidemia    Hypertension    RAD (reactive airway disease)    with RTIs   Tubular adenoma    Past Surgical History:  Procedure Laterality Date   COLONOSCOPY     colonoscopy with polypectomy  2013   Dr Noe Gens, [place]   COLONOSCOPY WITH PROPOFOL N/A 07/01/2019   Procedure: COLONOSCOPY WITH PROPOFOL;  Surgeon: Lemar Lofty., MD;  Location: [facility];  Service: Gastroenterology;  Laterality: N/A;   COLONOSCOPY WITH PROPOFOL N/A 05/30/2020   Procedure: COLONOSCOPY WITH PROPOFOL;  Surgeon: Meridee Score Netty Starring., MD;  Location: [facility];  Service: Gastroenterology;  Laterality: N/A;   ENDARTERECTOMY Right 04/11/2020   Procedure: Right Carotid Endarterectomy;  Surgeon: Sherren Kerns, MD;  Location: [facility] OR;  Service: Vascular;  Laterality: Right;   ENDOSCOPIC MUCOSAL RESECTION N/A 07/01/2019   Procedure: ENDOSCOPIC MUCOSAL RESECTION;  Surgeon: Meridee Score Netty Starring., MD;  Location: [facility];  Service: Gastroenterology;  Laterality: N/A;   ENDOSCOPIC MUCOSAL RESECTION N/A 05/30/2020   Procedure: ENDOSCOPIC MUCOSAL RESECTION;  Surgeon: Meridee Score Netty Starring., MD;  Location: [facility];  Service: Gastroenterology;  Laterality: N/A;   HEMOSTASIS CLIP PLACEMENT  07/01/2019    Procedure: HEMOSTASIS CLIP PLACEMENT;  Surgeon: Lemar Lofty., MD;  Location: [facility];  Service: Gastroenterology;;   POLYPECTOMY     POLYPECTOMY  07/01/2019   Procedure: POLYPECTOMY;  Surgeon: Lemar Lofty., MD;  Location: [facility];  Service: Gastroenterology;;   POLYPECTOMY  05/30/2020   Procedure: POLYPECTOMY;  Surgeon: Lemar Lofty., MD;  Location: [facility];  Service: Gastroenterology;;   Sunnie Nielsen LIFTING INJECTION  07/01/2019   Procedure: SUBMUCOSAL LIFTING INJECTION;  Surgeon: Lemar Lofty., MD;  Location: [facility];  Service: Gastroenterology;;   SUBMUCOSAL TATTOO INJECTION  07/01/2019   Procedure: SUBMUCOSAL TATTOO INJECTION;  Surgeon: Lemar Lofty., MD;  Location: [facility];  Service: Gastroenterology;;   WISDOM TOOTH EXTRACTION     Patient Active Problem List   Diagnosis Date Noted   Syncope 05/24/2022   Postnasal drip 05/23/2022   Sinus infection 03/19/2022   COVID 11/13/2021   Nuclear sclerotic cataract of both eyes 11/08/2020   Right shoulder pain 06/27/2020   S/P carotid endarterectomy, right 04/11/2020   Serrated adenoma of colon 03/12/2020   Aortic atherosclerosis 02/03/2020   Stenosis of right carotid artery 01/26/2020   Branch retinal artery occlusion, right eye 01/19/2020   Posterior vitreous detachment of both eyes 01/19/2020   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) 08/31/2019   Tear of MCL (medial collateral ligament) of knee, left, initial encounter 08/05/2017   Left inguinal hernia 07/04/2017  Allergic rhinitis 12/28/2015   H/O: rheumatic fever 12/28/2015   Prediabetes 12/27/2015   Rising PSA level 02/21/2014   Personal history of colonic polyps 02/17/2014   BPH with obstruction/lower urinary tract symptoms 05/04/2010   Hyperlipidemia 01/07/2009   Essential hypertension 09/26/2006    PCP: Richardean Sale  REFERRING PROVIDER: Richardean Sale  REFERRING DIAG:   M54.2 (ICD-10-CM) - Neck pain  S46.819S (ICD-10-CM) - Strain of trapezius muscle, unspecified laterality, sequela    THERAPY DIAG:  Cervicalgia  Muscle weakness (generalized)  Abnormal posture  Rationale for Evaluation and Treatment: Rehabilitation  ONSET DATE: 05/19/22  SUBJECTIVE:                                                                                                                                                                                                         SUBJECTIVE STATEMENT: The knee is loosening up some, I can bend it more. The neck feels good.   Hand dominance: Right  PERTINENT HISTORY:  Strain of trapezius muscle, unspecified laterality, initial encounter -Acute, initial visit - Patient continues to have muscular strains and pains from fall on 05/19/2022 - Reassuring that patient had unremarkable CT cervical spine at ER visit on 05/19/2022 - Patient is likely experiencing whiplash like muscular strains and tension resulting from fall  Closed nondisplaced comminuted fracture of left patella with routine healing, subsequent encounter -Subacute, improving, complicated, subsequent visit - X-ray obtained in clinic.  My interpretation: Interval healing of comminuted fracture of left patella - Patient has significantly improved since fall on 04/05/2022 causing comminuted left knee patellar fracture.  Fracture was treated conservatively with knee immobilization and gradual return to weightbearing as tolerated.  Patient is currently able to ambulate nearly pain-free - Continue physical therapy for stretching and strengthening of quadriceps, hamstrings which have become deconditioned due to our immobilization - May discontinue brace use and use it as needed.  May continue weightbearing as tolerated - May use rest ice compression elevation, Tylenol as needed for pain relief  PAIN:  Are you having pain? Yes: NPRS scale: 2/10 Pain location: neck and traps Pain  description: ache, pulling Aggravating factors: turning head  Relieving factors: Advil, Tylenol  PRECAUTIONS: None  WEIGHT BEARING RESTRICTIONS: No  FALLS:  Has patient fallen in last 6 months? Yes. Number of falls 2  LIVING ENVIRONMENT: Lives with: lives with their family and lives with their spouse Lives in: House/apartment Stairs: No Has following equipment at home: Single point cane, Environmental consultant - 2 wheeled, and Crutches  OCCUPATION: Semi-retired, run a Patent examiner business, sells insurance, and  plays in a band   PLOF: Independent  PATIENT GOALS:  be able to turn my head left and right without pain, helping the knee heal, increase strength to support the knee   NEXT MD VISIT: 08/13/22  OBJECTIVE:   DIAGNOSTIC FINDINGS:  Alignment: Normal.   Skull base and vertebrae: No acute fracture. No primary bone lesion or focal pathologic process.   Soft tissues and spinal canal: No prevertebral fluid or swelling. No visible canal hematoma.   Disc levels: Mild anterior bridging osteophytosis of the lower cervical levels as well as mild disc space height loss and osteophytosis of C3-C4, otherwise intact.   Upper chest: Negative.   Other: None.   IMPRESSION: 1. No acute intracranial pathology. 2. Suspect nondisplaced fractures of the nasal bones. No displaced fractures or dislocations of the facial bones. 3. No fracture or static subluxation of the cervical spine.  COGNITION: Overall cognitive status: Within functional limits for tasks assessed  SENSATION: WFL  POSTURE: rounded shoulders  PALPATION: TTP and trigger points along bilateral upper traps    CERVICAL ROM:   Active ROM A/PROM (deg) eval  Flexion WFL  Extension 50% w/pain  Right lateral flexion Mod tightness with pain  Left lateral flexion Very tight with pain  Right rotation 50% with pain  Left rotation 50% with pain   (Blank rows = not tested)  UPPER EXTREMITY ROM: grossly WFL   UPPER EXTREMITY  MMT: grossly 5/5   L KNEE ROM: knee extension 10d, knee flexion 98d  FUNCTIONAL TESTS:  5 times sit to stand: 20.88s  TODAY'S TREATMENT:                                                                                                                              DATE: 06/28/22 UBE L# x6 mins Horizontal abd green 2x10 Lats and rows 25# 2x10  Shoulder ext 10# 2x10 Standing rows 10# 2x10 STM to neck, passive stretching NuStep L5 x40mins  Leg ext 5# x10, LLE only x5  HS curls 20# x10, LLE only x5    06/26/22 NuStep L 5 x 6 min  MT STM to UT & Cervical spine  PROM to cervical spine with end range holds Leg press 30lb 3x10 Shoulder Ext 10lb standing on Airex 2x10 Standing rows 10lb 2x10  6in step ups x10 each  Shoulder ER red 2x10 S2S OHP yellow ball x10   06/21/22- EVAL   PATIENT EDUCATION:  Education details: HEP and POC Person educated: Patient Education method: Explanation Education comprehension: verbalized understanding  HOME EXERCISE PROGRAM: Access Code: [email or web address] URL: [email or web address] Date: 06/21/2022 Prepared by: Cassie Freer  Exercises - Seated Upper Trapezius Stretch  - 2 x daily - 7 x weekly - 30 hold - Seated Levator Scapulae Stretch  - 2 x daily - 7 x weekly - 30 hold - Doorway Pec Stretch at 90 Degrees Abduction  - 2 x daily - 7 x weekly - 30 hold -Self massage  with Tennis Ball for trigger point release   ASSESSMENT:  CLINICAL IMPRESSION: Patient returns with no pain in neck of knee, states both are getting better. Session addressed mostly the neck and a little bit for the L knee. TP noted in upper R trap with STM. He still has some tightness in bilateral upper traps but it is better than eval. Still lots of weakness in L quad.    OBJECTIVE IMPAIRMENTS: decreased ROM, increased fascial restrictions, impaired flexibility, and pain.   REHAB POTENTIAL: Good  CLINICAL DECISION MAKING: Stable/uncomplicated  EVALUATION COMPLEXITY:  Low   GOALS: Goals reviewed with patient? Yes  SHORT TERM GOALS: Target date: 07/26/22  Patient will be independent with initial HEP.  Goal status: INITIAL  2.   Patient will demonstrate improved functional LE strength as demonstrated by 5xSTS < 12s without UE support. Baseline: 20.88s with UE push off and pain Goal status: ongoing   LONG TERM GOALS: Target date: 08/30/22  Patient will be independent with advanced/ongoing HEP to improve outcomes and carryover.  Goal status: INITIAL  2.  Patient will report 75% improvement in neck pain to improve QOL.  Baseline: 4-8/10 with movement Goal status: INITIAL  3.  Patient will demonstrate full pain free cervical ROM for safety with driving.  Baseline: see chart above Goal status: INITIAL  4. Patient will demonstrate improved L knee AROM to >/= 0-120 deg to allow for normal gait and stair mechanics. Baseline: 10-0-98 Goal status: INITIAL  PLAN:  PT FREQUENCY: 2x/week  PT DURATION: 10 weeks  PLANNED INTERVENTIONS: Therapeutic exercises, Therapeutic activity, Neuromuscular re-education, Balance training, Gait training, Patient/Family education, Self Care, Joint mobilization, Dry Needling, Cryotherapy, Moist heat, Vasopneumatic device, Traction, and Manual therapy  PLAN FOR NEXT SESSION: Dry needling for bilateral UT, stretching neck, work on knee strengthening and stretching too   [employer], PT 06/28/2022, 11:44 AM

## 2022-07-02 ENCOUNTER — Ambulatory Visit: Payer: Medicare HMO | Admitting: Physical Therapy

## 2022-07-03 ENCOUNTER — Encounter: Payer: Self-pay | Admitting: Physical Therapy

## 2022-07-03 ENCOUNTER — Ambulatory Visit: Payer: Medicare HMO | Admitting: Physical Therapy

## 2022-07-03 DIAGNOSIS — R293 Abnormal posture: Secondary | ICD-10-CM | POA: Diagnosis not present

## 2022-07-03 DIAGNOSIS — M25662 Stiffness of left knee, not elsewhere classified: Secondary | ICD-10-CM | POA: Diagnosis not present

## 2022-07-03 DIAGNOSIS — M6281 Muscle weakness (generalized): Secondary | ICD-10-CM

## 2022-07-03 DIAGNOSIS — M25611 Stiffness of right shoulder, not elsewhere classified: Secondary | ICD-10-CM | POA: Diagnosis not present

## 2022-07-03 DIAGNOSIS — R2689 Other abnormalities of gait and mobility: Secondary | ICD-10-CM | POA: Diagnosis not present

## 2022-07-03 DIAGNOSIS — R600 Localized edema: Secondary | ICD-10-CM | POA: Diagnosis not present

## 2022-07-03 DIAGNOSIS — M25511 Pain in right shoulder: Secondary | ICD-10-CM | POA: Diagnosis not present

## 2022-07-03 DIAGNOSIS — M542 Cervicalgia: Secondary | ICD-10-CM | POA: Diagnosis not present

## 2022-07-03 NOTE — Therapy (Signed)
OUTPATIENT PHYSICAL THERAPY CERVICAL TREATMENT   Patient Name: Charles Tate MRN: [id number] DOB:29-May-1950, 72 y.o., male Today's Date: 07/03/2022  END OF SESSION:  PT End of Session - 07/03/22 0847     Visit Number 4    Date for PT Re-Evaluation 08/30/22    PT Start Time 0847    PT Stop Time 0930    PT Time Calculation (min) 43 min    Activity Tolerance Patient tolerated treatment well    Behavior During Therapy [facility] for tasks assessed/performed              Past Medical History:  Diagnosis Date   Allergy    Arthritis    left hip    GERD (gastroesophageal reflux disease)    per patient, resolved. 03/15/20   Heart murmur    adolescent rheumatic fever - developed murmur    Hyperlipidemia    Hypertension    RAD (reactive airway disease)    with RTIs   Tubular adenoma    Past Surgical History:  Procedure Laterality Date   COLONOSCOPY     colonoscopy with polypectomy  2013   Dr Noe Gens, [place]   COLONOSCOPY WITH PROPOFOL N/A 07/01/2019   Procedure: COLONOSCOPY WITH PROPOFOL;  Surgeon: Lemar Lofty., MD;  Location: [facility];  Service: Gastroenterology;  Laterality: N/A;   COLONOSCOPY WITH PROPOFOL N/A 05/30/2020   Procedure: COLONOSCOPY WITH PROPOFOL;  Surgeon: Meridee Score Netty Starring., MD;  Location: [facility];  Service: Gastroenterology;  Laterality: N/A;   ENDARTERECTOMY Right 04/11/2020   Procedure: Right Carotid Endarterectomy;  Surgeon: Sherren Kerns, MD;  Location: [facility] OR;  Service: Vascular;  Laterality: Right;   ENDOSCOPIC MUCOSAL RESECTION N/A 07/01/2019   Procedure: ENDOSCOPIC MUCOSAL RESECTION;  Surgeon: Meridee Score Netty Starring., MD;  Location: [facility];  Service: Gastroenterology;  Laterality: N/A;   ENDOSCOPIC MUCOSAL RESECTION N/A 05/30/2020   Procedure: ENDOSCOPIC MUCOSAL RESECTION;  Surgeon: Meridee Score Netty Starring., MD;  Location: [facility];  Service: Gastroenterology;  Laterality: N/A;   HEMOSTASIS CLIP PLACEMENT  07/01/2019    Procedure: HEMOSTASIS CLIP PLACEMENT;  Surgeon: Lemar Lofty., MD;  Location: [facility];  Service: Gastroenterology;;   POLYPECTOMY     POLYPECTOMY  07/01/2019   Procedure: POLYPECTOMY;  Surgeon: Lemar Lofty., MD;  Location: [facility];  Service: Gastroenterology;;   POLYPECTOMY  05/30/2020   Procedure: POLYPECTOMY;  Surgeon: Lemar Lofty., MD;  Location: [facility];  Service: Gastroenterology;;   Sunnie Nielsen LIFTING INJECTION  07/01/2019   Procedure: SUBMUCOSAL LIFTING INJECTION;  Surgeon: Lemar Lofty., MD;  Location: [facility];  Service: Gastroenterology;;   SUBMUCOSAL TATTOO INJECTION  07/01/2019   Procedure: SUBMUCOSAL TATTOO INJECTION;  Surgeon: Lemar Lofty., MD;  Location: [facility];  Service: Gastroenterology;;   WISDOM TOOTH EXTRACTION     Patient Active Problem List   Diagnosis Date Noted   Syncope 05/24/2022   Postnasal drip 05/23/2022   Sinus infection 03/19/2022   COVID 11/13/2021   Nuclear sclerotic cataract of both eyes 11/08/2020   Right shoulder pain 06/27/2020   S/P carotid endarterectomy, right 04/11/2020   Serrated adenoma of colon 03/12/2020   Aortic atherosclerosis (HCC) 02/03/2020   Stenosis of right carotid artery 01/26/2020   Branch retinal artery occlusion, right eye 01/19/2020   Posterior vitreous detachment of both eyes 01/19/2020   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) 08/31/2019   Tear of MCL (medial collateral ligament) of knee, left, initial encounter 08/05/2017   Left inguinal hernia 07/04/2017  Allergic rhinitis 12/28/2015   H/O: rheumatic fever 12/28/2015   Prediabetes 12/27/2015   Rising PSA level 02/21/2014   Personal history of colonic polyps 02/17/2014   BPH with obstruction/lower urinary tract symptoms 05/04/2010   Hyperlipidemia 01/07/2009   Essential hypertension 09/26/2006    PCP: Richardean Sale  REFERRING PROVIDER: Richardean Sale  REFERRING  DIAG:  M54.2 (ICD-10-CM) - Neck pain  S46.819S (ICD-10-CM) - Strain of trapezius muscle, unspecified laterality, sequela    THERAPY DIAG:  Muscle weakness (generalized)  Abnormal posture  Stiffness of left knee, not elsewhere classified  Localized edema  Rationale for Evaluation and Treatment: Rehabilitation  ONSET DATE: 05/19/22  SUBJECTIVE:                                                                                                                                                                                                         SUBJECTIVE STATEMENT: Doing ok today, Drove to [place] Friday had some pain in the knee and L hip. Was ok when he stopped and rested  Hand dominance: Right  PERTINENT HISTORY:  Strain of trapezius muscle, unspecified laterality, initial encounter -Acute, initial visit - Patient continues to have muscular strains and pains from fall on 05/19/2022 - Reassuring that patient had unremarkable CT cervical spine at ER visit on 05/19/2022 - Patient is likely experiencing whiplash like muscular strains and tension resulting from fall  Closed nondisplaced comminuted fracture of left patella with routine healing, subsequent encounter -Subacute, improving, complicated, subsequent visit - X-ray obtained in clinic.  My interpretation: Interval healing of comminuted fracture of left patella - Patient has significantly improved since fall on 04/05/2022 causing comminuted left knee patellar fracture.  Fracture was treated conservatively with knee immobilization and gradual return to weightbearing as tolerated.  Patient is currently able to ambulate nearly pain-free - Continue physical therapy for stretching and strengthening of quadriceps, hamstrings which have become deconditioned due to our immobilization - May discontinue brace use and use it as needed.  May continue weightbearing as tolerated - May use rest ice compression elevation, Tylenol as needed for  pain relief  PAIN:  Are you having pain? Yes: NPRS scale: 0/10 Pain location: neck and traps Pain description: ache, pulling Aggravating factors: turning head  Relieving factors: Advil, Tylenol  PRECAUTIONS: None  WEIGHT BEARING RESTRICTIONS: No  FALLS:  Has patient fallen in last 6 months? Yes. Number of falls 2  LIVING ENVIRONMENT: Lives with: lives with their family and lives with their spouse Lives in: House/apartment Stairs: No Has following equipment at home: Single point cane, Environmental consultant -  2 wheeled, and Crutches  OCCUPATION: Semi-retired, run a Patent examiner business, sells insurance, and plays in a band   PLOF: Independent  PATIENT GOALS:  be able to turn my head left and right without pain, helping the knee heal, increase strength to support the knee   NEXT MD VISIT: 08/13/22  OBJECTIVE:   DIAGNOSTIC FINDINGS:  Alignment: Normal.   Skull base and vertebrae: No acute fracture. No primary bone lesion or focal pathologic process.   Soft tissues and spinal canal: No prevertebral fluid or swelling. No visible canal hematoma.   Disc levels: Mild anterior bridging osteophytosis of the lower cervical levels as well as mild disc space height loss and osteophytosis of C3-C4, otherwise intact.   Upper chest: Negative.   Other: None.   IMPRESSION: 1. No acute intracranial pathology. 2. Suspect nondisplaced fractures of the nasal bones. No displaced fractures or dislocations of the facial bones. 3. No fracture or static subluxation of the cervical spine.  COGNITION: Overall cognitive status: Within functional limits for tasks assessed  SENSATION: WFL  POSTURE: rounded shoulders  PALPATION: TTP and trigger points along bilateral upper traps    CERVICAL ROM:   Active ROM A/PROM (deg) eval  Flexion WFL  Extension 50% w/pain  Right lateral flexion Mod tightness with pain  Left lateral flexion Very tight with pain  Right rotation 50% with pain  Left  rotation 50% with pain   (Blank rows = not tested)  UPPER EXTREMITY ROM: grossly WFL   UPPER EXTREMITY MMT: grossly 5/5   L KNEE ROM: knee extension 10d, knee flexion 98d  FUNCTIONAL TESTS:  5 times sit to stand: 20.88s  TODAY'S TREATMENT:                                                                                                                              DATE: 07/03/22 NuStep L 5 x 5 min UBE L 2 x 2 min each Leg press 40lb 2x15 Rows & Lats 25lb 2x12 STM UT and cervical paraspinales  Resisted gait 30lb all directions x3  Shoulder Ext on airex 10lb 2x10   06/28/22 UBE L# x6 mins Horizontal abd green 2x10 Lats and rows 25# 2x10  Shoulder ext 10# 2x10 Standing rows 10# 2x10 STM to neck, passive stretching NuStep L5 x43mins  Leg ext 5# x10, LLE only x5  HS curls 20# x10, LLE only x5    06/26/22 NuStep L 5 x 6 min  MT STM to UT & Cervical spine  PROM to cervical spine with end range holds Leg press 30lb 3x10 Shoulder Ext 10lb standing on Airex 2x10 Standing rows 10lb 2x10  6in step ups x10 each  Shoulder ER red 2x10 S2S OHP yellow ball x10   06/21/22- EVAL   PATIENT EDUCATION:  Education details: HEP and POC Person educated: Patient Education method: Explanation Education comprehension: verbalized understanding  HOME EXERCISE PROGRAM: Access Code: [email or web address] URL: [email or web address] Date: 06/21/2022 Prepared by: Raynelle Fanning  Sajjad  Exercises - Seated Upper Trapezius Stretch  - 2 x daily - 7 x weekly - 30 hold - Seated Levator Scapulae Stretch  - 2 x daily - 7 x weekly - 30 hold - Doorway Pec Stretch at 90 Degrees Abduction  - 2 x daily - 7 x weekly - 30 hold -Self massage with Tennis Ball for trigger point release   ASSESSMENT:  CLINICAL IMPRESSION: Patient returns with no pain in neck or knee, states both are getting better. Session addressed both knee and neck. Eccentric load weakness present in the L quad with resisted gait. Some  instability with resisted side steps. Positive response to STM. No reports of pain during session.   OBJECTIVE IMPAIRMENTS: decreased ROM, increased fascial restrictions, impaired flexibility, and pain.   REHAB POTENTIAL: Good  CLINICAL DECISION MAKING: Stable/uncomplicated  EVALUATION COMPLEXITY: Low   GOALS: Goals reviewed with patient? Yes  SHORT TERM GOALS: Target date: 07/26/22  Patient will be independent with initial HEP.  Goal status: INITIAL  2.   Patient will demonstrate improved functional LE strength as demonstrated by 5xSTS < 12s without UE support. Baseline: 20.88s with UE push off and pain Goal status: ongoing   LONG TERM GOALS: Target date: 08/30/22  Patient will be independent with advanced/ongoing HEP to improve outcomes and carryover.  Goal status: INITIAL  2.  Patient will report 75% improvement in neck pain to improve QOL.  Baseline: 4-8/10 with movement Goal status: INITIAL  3.  Patient will demonstrate full pain free cervical ROM for safety with driving.  Baseline: see chart above Goal status: INITIAL  4. Patient will demonstrate improved L knee AROM to >/= 0-120 deg to allow for normal gait and stair mechanics. Baseline: 10-0-98 Goal status: INITIAL  PLAN:  PT FREQUENCY: 2x/week  PT DURATION: 10 weeks  PLANNED INTERVENTIONS: Therapeutic exercises, Therapeutic activity, Neuromuscular re-education, Balance training, Gait training, Patient/Family education, Self Care, Joint mobilization, Dry Needling, Cryotherapy, Moist heat, Vasopneumatic device, Traction, and Manual therapy  PLAN FOR NEXT SESSION: Dry needling for bilateral UT, stretching neck, work on knee strengthening and stretching too   Grayce Sessions, PTA 07/03/2022, 8:48 AM

## 2022-07-05 ENCOUNTER — Ambulatory Visit: Payer: Medicare HMO | Attending: Sports Medicine | Admitting: Physical Therapy

## 2022-07-05 ENCOUNTER — Encounter: Payer: Self-pay | Admitting: Physical Therapy

## 2022-07-05 DIAGNOSIS — M25662 Stiffness of left knee, not elsewhere classified: Secondary | ICD-10-CM | POA: Diagnosis not present

## 2022-07-05 DIAGNOSIS — M542 Cervicalgia: Secondary | ICD-10-CM | POA: Diagnosis not present

## 2022-07-05 DIAGNOSIS — R293 Abnormal posture: Secondary | ICD-10-CM

## 2022-07-05 DIAGNOSIS — M6281 Muscle weakness (generalized): Secondary | ICD-10-CM | POA: Diagnosis not present

## 2022-07-05 DIAGNOSIS — R600 Localized edema: Secondary | ICD-10-CM | POA: Diagnosis not present

## 2022-07-05 NOTE — Therapy (Signed)
OUTPATIENT PHYSICAL THERAPY CERVICAL TREATMENT   Patient Name: Charles Tate MRN: [id number] DOB:07-Apr-1950, 72 y.o., male Today's Date: 07/05/2022  END OF SESSION:  PT End of Session - 07/05/22 0843     Visit Number 5    Date for PT Re-Evaluation 08/30/22    PT Start Time 0845    PT Stop Time 0930    PT Time Calculation (min) 45 min    Activity Tolerance Patient tolerated treatment well    Behavior During Therapy [facility] for tasks assessed/performed              Past Medical History:  Diagnosis Date   Allergy    Arthritis    left hip    GERD (gastroesophageal reflux disease)    per patient, resolved. 03/15/20   Heart murmur    adolescent rheumatic fever - developed murmur    Hyperlipidemia    Hypertension    RAD (reactive airway disease)    with RTIs   Tubular adenoma    Past Surgical History:  Procedure Laterality Date   COLONOSCOPY     colonoscopy with polypectomy  2013   Dr Noe Gens, [place]   COLONOSCOPY WITH PROPOFOL N/A 07/01/2019   Procedure: COLONOSCOPY WITH PROPOFOL;  Surgeon: Lemar Lofty., MD;  Location: [facility];  Service: Gastroenterology;  Laterality: N/A;   COLONOSCOPY WITH PROPOFOL N/A 05/30/2020   Procedure: COLONOSCOPY WITH PROPOFOL;  Surgeon: Meridee Score Netty Starring., MD;  Location: [facility];  Service: Gastroenterology;  Laterality: N/A;   ENDARTERECTOMY Right 04/11/2020   Procedure: Right Carotid Endarterectomy;  Surgeon: Sherren Kerns, MD;  Location: [facility] OR;  Service: Vascular;  Laterality: Right;   ENDOSCOPIC MUCOSAL RESECTION N/A 07/01/2019   Procedure: ENDOSCOPIC MUCOSAL RESECTION;  Surgeon: Meridee Score Netty Starring., MD;  Location: [facility];  Service: Gastroenterology;  Laterality: N/A;   ENDOSCOPIC MUCOSAL RESECTION N/A 05/30/2020   Procedure: ENDOSCOPIC MUCOSAL RESECTION;  Surgeon: Meridee Score Netty Starring., MD;  Location: [facility];  Service: Gastroenterology;  Laterality: N/A;   HEMOSTASIS CLIP PLACEMENT  07/01/2019    Procedure: HEMOSTASIS CLIP PLACEMENT;  Surgeon: Lemar Lofty., MD;  Location: [facility];  Service: Gastroenterology;;   POLYPECTOMY     POLYPECTOMY  07/01/2019   Procedure: POLYPECTOMY;  Surgeon: Lemar Lofty., MD;  Location: [facility];  Service: Gastroenterology;;   POLYPECTOMY  05/30/2020   Procedure: POLYPECTOMY;  Surgeon: Lemar Lofty., MD;  Location: [facility];  Service: Gastroenterology;;   Sunnie Nielsen LIFTING INJECTION  07/01/2019   Procedure: SUBMUCOSAL LIFTING INJECTION;  Surgeon: Lemar Lofty., MD;  Location: [facility];  Service: Gastroenterology;;   SUBMUCOSAL TATTOO INJECTION  07/01/2019   Procedure: SUBMUCOSAL TATTOO INJECTION;  Surgeon: Lemar Lofty., MD;  Location: [facility];  Service: Gastroenterology;;   WISDOM TOOTH EXTRACTION     Patient Active Problem List   Diagnosis Date Noted   Syncope 05/24/2022   Postnasal drip 05/23/2022   Sinus infection 03/19/2022   COVID 11/13/2021   Nuclear sclerotic cataract of both eyes 11/08/2020   Right shoulder pain 06/27/2020   S/P carotid endarterectomy, right 04/11/2020   Serrated adenoma of colon 03/12/2020   Aortic atherosclerosis (HCC) 02/03/2020   Stenosis of right carotid artery 01/26/2020   Branch retinal artery occlusion, right eye 01/19/2020   Posterior vitreous detachment of both eyes 01/19/2020   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) 08/31/2019   Tear of MCL (medial collateral ligament) of knee, left, initial encounter 08/05/2017   Left inguinal hernia 07/04/2017  Allergic rhinitis 12/28/2015   H/O: rheumatic fever 12/28/2015   Prediabetes 12/27/2015   Rising PSA level 02/21/2014   Personal history of colonic polyps 02/17/2014   BPH with obstruction/lower urinary tract symptoms 05/04/2010   Hyperlipidemia 01/07/2009   Essential hypertension 09/26/2006    PCP: Richardean Sale  REFERRING PROVIDER: Richardean Sale  REFERRING  DIAG:  M54.2 (ICD-10-CM) - Neck pain  S46.819S (ICD-10-CM) - Strain of trapezius muscle, unspecified laterality, sequela    THERAPY DIAG:  Muscle weakness (generalized)  Abnormal posture  Stiffness of left knee, not elsewhere classified  Cervicalgia  Localized edema  Rationale for Evaluation and Treatment: Rehabilitation  ONSET DATE: 05/19/22  SUBJECTIVE:                                                                                                                                                                                                         SUBJECTIVE STATEMENT: "Feel great, woke up early"  Hand dominance: Right  PERTINENT HISTORY:  Strain of trapezius muscle, unspecified laterality, initial encounter -Acute, initial visit - Patient continues to have muscular strains and pains from fall on 05/19/2022 - Reassuring that patient had unremarkable CT cervical spine at ER visit on 05/19/2022 - Patient is likely experiencing whiplash like muscular strains and tension resulting from fall  Closed nondisplaced comminuted fracture of left patella with routine healing, subsequent encounter -Subacute, improving, complicated, subsequent visit - X-ray obtained in clinic.  My interpretation: Interval healing of comminuted fracture of left patella - Patient has significantly improved since fall on 04/05/2022 causing comminuted left knee patellar fracture.  Fracture was treated conservatively with knee immobilization and gradual return to weightbearing as tolerated.  Patient is currently able to ambulate nearly pain-free - Continue physical therapy for stretching and strengthening of quadriceps, hamstrings which have become deconditioned due to our immobilization - May discontinue brace use and use it as needed.  May continue weightbearing as tolerated - May use rest ice compression elevation, Tylenol as needed for pain relief  PAIN:  Are you having pain? Yes: NPRS scale: 1/10 Pain  location: shoulder and neck Pain description: ache, pulling Aggravating factors: turning head  Relieving factors: Advil, Tylenol  PRECAUTIONS: None  WEIGHT BEARING RESTRICTIONS: No  FALLS:  Has patient fallen in last 6 months? Yes. Number of falls 2  LIVING ENVIRONMENT: Lives with: lives with their family and lives with their spouse Lives in: House/apartment Stairs: No Has following equipment at home: Single point cane, Environmental consultant - 2 wheeled, and Crutches  OCCUPATION: Semi-retired, run a Patent examiner business, sells insurance, and  plays in a band   PLOF: Independent  PATIENT GOALS:  be able to turn my head left and right without pain, helping the knee heal, increase strength to support the knee   NEXT MD VISIT: 08/13/22  OBJECTIVE:   DIAGNOSTIC FINDINGS:  Alignment: Normal.   Skull base and vertebrae: No acute fracture. No primary bone lesion or focal pathologic process.   Soft tissues and spinal canal: No prevertebral fluid or swelling. No visible canal hematoma.   Disc levels: Mild anterior bridging osteophytosis of the lower cervical levels as well as mild disc space height loss and osteophytosis of C3-C4, otherwise intact.   Upper chest: Negative.   Other: None.   IMPRESSION: 1. No acute intracranial pathology. 2. Suspect nondisplaced fractures of the nasal bones. No displaced fractures or dislocations of the facial bones. 3. No fracture or static subluxation of the cervical spine.  COGNITION: Overall cognitive status: Within functional limits for tasks assessed  SENSATION: WFL  POSTURE: rounded shoulders  PALPATION: TTP and trigger points along bilateral upper traps    CERVICAL ROM:   Active ROM A/PROM (deg) eval  Flexion WFL  Extension 50% w/pain  Right lateral flexion Mod tightness with pain  Left lateral flexion Very tight with pain  Right rotation 50% with pain  Left rotation 50% with pain   (Blank rows = not tested)  UPPER EXTREMITY ROM:  grossly WFL   UPPER EXTREMITY MMT: grossly 5/5   L KNEE ROM: knee extension 10d, knee flexion 98d  FUNCTIONAL TESTS:  5 times sit to stand: 20.88s  TODAY'S TREATMENT:                                                                                                                              DATE: 07/05/22 Bike L# x 4 min STM UT and cervical paraspinales  Leg press 50lb 2x15 Rows & Lats 35lb 2x12 Cervical retractions 2x10 Chest press 15lb 21x0 Hamstring Curls 25lb 2x10 Leg Ext 10lb 2x10 Step ups LLE x10  07/03/22 NuStep L 5 x 5 min UBE L 2 x 2 min each Leg press 40lb 2x15 Rows & Lats 25lb 2x12 STM UT and cervical paraspinales  Resisted gait 30lb all directions x3  Shoulder Ext on airex 10lb 2x10   06/28/22 UBE L# x6 mins Horizontal abd green 2x10 Lats and rows 25# 2x10  Shoulder ext 10# 2x10 Standing rows 10# 2x10 STM to neck, passive stretching NuStep L5 x20mins  Leg ext 5# x10, LLE only x5  HS curls 20# x10, LLE only x5    06/26/22 NuStep L 5 x 6 min  MT STM to UT & Cervical spine  PROM to cervical spine with end range holds Leg press 30lb 3x10 Shoulder Ext 10lb standing on Airex 2x10 Standing rows 10lb 2x10  6in step ups x10 each  Shoulder ER red 2x10 S2S OHP yellow ball x10   06/21/22- EVAL   PATIENT EDUCATION:  Education details: HEP  and POC Person educated: Patient Education method: Explanation Education comprehension: verbalized understanding  HOME EXERCISE PROGRAM: Access Code: [email or web address] URL: [email or web address] Date: 06/21/2022 Prepared by: Cassie Freer  Exercises - Seated Upper Trapezius Stretch  - 2 x daily - 7 x weekly - 30 hold - Seated Levator Scapulae Stretch  - 2 x daily - 7 x weekly - 30 hold - Doorway Pec Stretch at 90 Degrees Abduction  - 2 x daily - 7 x weekly - 30 hold -Self massage with Tennis Ball for trigger point release   ASSESSMENT:  CLINICAL IMPRESSION: Patient enters doing well. He has progressed towards  goals. Continues to progress with LE strengthening. Some tightness noted with the UT.   Pt reports he could feel a stretch with cervical retraction. Increase resistance tolerated with all machine level interventions.Cue for full TKE needed with step ups. Positive response to STM. No reports of pain during session.   OBJECTIVE IMPAIRMENTS: decreased ROM, increased fascial restrictions, impaired flexibility, and pain.   REHAB POTENTIAL: Good  CLINICAL DECISION MAKING: Stable/uncomplicated  EVALUATION COMPLEXITY: Low   GOALS: Goals reviewed with patient? Yes  SHORT TERM GOALS: Target date: 07/26/22  Patient will be independent with initial HEP.  Goal status: Met 07/05/22  2.   Patient will demonstrate improved functional LE strength as demonstrated by 5xSTS < 12s without UE support. Baseline: 20.88s with UE push off and pain Goal status: Progressing 14.03 07/05/22   LONG TERM GOALS: Target date: 08/30/22  Patient will be independent with advanced/ongoing HEP to improve outcomes and carryover.  Goal status: INITIAL  2.  Patient will report 75% improvement in neck pain to improve QOL.  Baseline: 4-8/10 with movement Goal status: Progressing 40% 07/05/22  3.  Patient will demonstrate full pain free cervical ROM for safety with driving.  Baseline: see chart above Goal status: INITIAL  4. Patient will demonstrate improved L knee AROM to >/= 0-120 deg to allow for normal gait and stair mechanics. Baseline: 10-0-98 Goal status: INITIAL  PLAN:  PT FREQUENCY: 2x/week  PT DURATION: 10 weeks  PLANNED INTERVENTIONS: Therapeutic exercises, Therapeutic activity, Neuromuscular re-education, Balance training, Gait training, Patient/Family education, Self Care, Joint mobilization, Dry Needling, Cryotherapy, Moist heat, Vasopneumatic device, Traction, and Manual therapy  PLAN FOR NEXT SESSION: Dry needling for bilateral UT, stretching neck, work on knee strengthening and stretching  too   Grayce Sessions, PTA 07/05/2022, 8:43 AM

## 2022-07-09 ENCOUNTER — Ambulatory Visit: Payer: Medicare HMO | Admitting: Physical Therapy

## 2022-07-12 ENCOUNTER — Ambulatory Visit: Payer: Medicare HMO

## 2022-07-16 ENCOUNTER — Encounter: Payer: Self-pay | Admitting: Physical Therapy

## 2022-07-16 ENCOUNTER — Ambulatory Visit: Payer: Medicare HMO | Admitting: Physical Therapy

## 2022-07-16 DIAGNOSIS — R293 Abnormal posture: Secondary | ICD-10-CM

## 2022-07-16 DIAGNOSIS — M25662 Stiffness of left knee, not elsewhere classified: Secondary | ICD-10-CM

## 2022-07-16 DIAGNOSIS — M6281 Muscle weakness (generalized): Secondary | ICD-10-CM | POA: Diagnosis not present

## 2022-07-16 DIAGNOSIS — R600 Localized edema: Secondary | ICD-10-CM | POA: Diagnosis not present

## 2022-07-16 DIAGNOSIS — M542 Cervicalgia: Secondary | ICD-10-CM

## 2022-07-16 NOTE — Therapy (Signed)
OUTPATIENT PHYSICAL THERAPY CERVICAL TREATMENT   Patient Name: Charles Tate MRN: [id number] DOB:10/28/1950, 72 y.o., male Today's Date: 07/16/2022  END OF SESSION:  PT End of Session - 07/16/22 0804     Visit Number 6    Date for PT Re-Evaluation 08/30/22    PT Start Time 0804    PT Stop Time 0845    PT Time Calculation (min) 41 min    Activity Tolerance Patient tolerated treatment well    Behavior During Therapy [facility] for tasks assessed/performed              Past Medical History:  Diagnosis Date   Allergy    Arthritis    left hip    GERD (gastroesophageal reflux disease)    per patient, resolved. 03/15/20   Heart murmur    adolescent rheumatic fever - developed murmur    Hyperlipidemia    Hypertension    RAD (reactive airway disease)    with RTIs   Tubular adenoma    Past Surgical History:  Procedure Laterality Date   COLONOSCOPY     colonoscopy with polypectomy  2013   Dr Noe Gens, [place]   COLONOSCOPY WITH PROPOFOL N/A 07/01/2019   Procedure: COLONOSCOPY WITH PROPOFOL;  Surgeon: Lemar Lofty., MD;  Location: [facility];  Service: Gastroenterology;  Laterality: N/A;   COLONOSCOPY WITH PROPOFOL N/A 05/30/2020   Procedure: COLONOSCOPY WITH PROPOFOL;  Surgeon: Meridee Score Netty Starring., MD;  Location: [facility];  Service: Gastroenterology;  Laterality: N/A;   ENDARTERECTOMY Right 04/11/2020   Procedure: Right Carotid Endarterectomy;  Surgeon: Sherren Kerns, MD;  Location: [facility] OR;  Service: Vascular;  Laterality: Right;   ENDOSCOPIC MUCOSAL RESECTION N/A 07/01/2019   Procedure: ENDOSCOPIC MUCOSAL RESECTION;  Surgeon: Meridee Score Netty Starring., MD;  Location: [facility];  Service: Gastroenterology;  Laterality: N/A;   ENDOSCOPIC MUCOSAL RESECTION N/A 05/30/2020   Procedure: ENDOSCOPIC MUCOSAL RESECTION;  Surgeon: Meridee Score Netty Starring., MD;  Location: [facility];  Service: Gastroenterology;  Laterality: N/A;   HEMOSTASIS CLIP PLACEMENT  07/01/2019    Procedure: HEMOSTASIS CLIP PLACEMENT;  Surgeon: Lemar Lofty., MD;  Location: [facility];  Service: Gastroenterology;;   POLYPECTOMY     POLYPECTOMY  07/01/2019   Procedure: POLYPECTOMY;  Surgeon: Lemar Lofty., MD;  Location: [facility];  Service: Gastroenterology;;   POLYPECTOMY  05/30/2020   Procedure: POLYPECTOMY;  Surgeon: Lemar Lofty., MD;  Location: [facility];  Service: Gastroenterology;;   Sunnie Nielsen LIFTING INJECTION  07/01/2019   Procedure: SUBMUCOSAL LIFTING INJECTION;  Surgeon: Lemar Lofty., MD;  Location: [facility];  Service: Gastroenterology;;   SUBMUCOSAL TATTOO INJECTION  07/01/2019   Procedure: SUBMUCOSAL TATTOO INJECTION;  Surgeon: Lemar Lofty., MD;  Location: [facility];  Service: Gastroenterology;;   WISDOM TOOTH EXTRACTION     Patient Active Problem List   Diagnosis Date Noted   Syncope 05/24/2022   Postnasal drip 05/23/2022   Sinus infection 03/19/2022   COVID 11/13/2021   Nuclear sclerotic cataract of both eyes 11/08/2020   Right shoulder pain 06/27/2020   S/P carotid endarterectomy, right 04/11/2020   Serrated adenoma of colon 03/12/2020   Aortic atherosclerosis (HCC) 02/03/2020   Stenosis of right carotid artery 01/26/2020   Branch retinal artery occlusion, right eye 01/19/2020   Posterior vitreous detachment of both eyes 01/19/2020   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) 08/31/2019   Tear of MCL (medial collateral ligament) of knee, left, initial encounter 08/05/2017   Left inguinal hernia 07/04/2017  Allergic rhinitis 12/28/2015   H/O: rheumatic fever 12/28/2015   Prediabetes 12/27/2015   Rising PSA level 02/21/2014   Personal history of colonic polyps 02/17/2014   BPH with obstruction/lower urinary tract symptoms 05/04/2010   Hyperlipidemia 01/07/2009   Essential hypertension 09/26/2006    PCP: Richardean Sale  REFERRING PROVIDER: Richardean Sale  REFERRING  DIAG:  M54.2 (ICD-10-CM) - Neck pain  S46.819S (ICD-10-CM) - Strain of trapezius muscle, unspecified laterality, sequela    THERAPY DIAG:  Muscle weakness (generalized)  Abnormal posture  Stiffness of left knee, not elsewhere classified  Cervicalgia  Rationale for Evaluation and Treatment: Rehabilitation  ONSET DATE: 05/19/22  SUBJECTIVE:                                                                                                                                                                                                         SUBJECTIVE STATEMENT: A little sore yesterday, had a big day Saturday.  Hand dominance: Right  PERTINENT HISTORY:  Strain of trapezius muscle, unspecified laterality, initial encounter -Acute, initial visit - Patient continues to have muscular strains and pains from fall on 05/19/2022 - Reassuring that patient had unremarkable CT cervical spine at ER visit on 05/19/2022 - Patient is likely experiencing whiplash like muscular strains and tension resulting from fall  Closed nondisplaced comminuted fracture of left patella with routine healing, subsequent encounter -Subacute, improving, complicated, subsequent visit - X-ray obtained in clinic.  My interpretation: Interval healing of comminuted fracture of left patella - Patient has significantly improved since fall on 04/05/2022 causing comminuted left knee patellar fracture.  Fracture was treated conservatively with knee immobilization and gradual return to weightbearing as tolerated.  Patient is currently able to ambulate nearly pain-free - Continue physical therapy for stretching and strengthening of quadriceps, hamstrings which have become deconditioned due to our immobilization - May discontinue brace use and use it as needed.  May continue weightbearing as tolerated - May use rest ice compression elevation, Tylenol as needed for pain relief  PAIN:  Are you having pain? Yes: NPRS scale: 0/10 Pain  location: shoulder and neck Pain description: ache, pulling Aggravating factors: turning head  Relieving factors: Advil, Tylenol  PRECAUTIONS: None  WEIGHT BEARING RESTRICTIONS: No  FALLS:  Has patient fallen in last 6 months? Yes. Number of falls 2  LIVING ENVIRONMENT: Lives with: lives with their family and lives with their spouse Lives in: House/apartment Stairs: No Has following equipment at home: Single point cane, Environmental consultant - 2 wheeled, and Crutches  OCCUPATION: Semi-retired, run a Patent examiner business, sells insurance,  and plays in a band   PLOF: Independent  PATIENT GOALS:  be able to turn my head left and right without pain, helping the knee heal, increase strength to support the knee   NEXT MD VISIT: 08/13/22  OBJECTIVE:   DIAGNOSTIC FINDINGS:  Alignment: Normal.   Skull base and vertebrae: No acute fracture. No primary bone lesion or focal pathologic process.   Soft tissues and spinal canal: No prevertebral fluid or swelling. No visible canal hematoma.   Disc levels: Mild anterior bridging osteophytosis of the lower cervical levels as well as mild disc space height loss and osteophytosis of C3-C4, otherwise intact.   Upper chest: Negative.   Other: None.   IMPRESSION: 1. No acute intracranial pathology. 2. Suspect nondisplaced fractures of the nasal bones. No displaced fractures or dislocations of the facial bones. 3. No fracture or static subluxation of the cervical spine.  COGNITION: Overall cognitive status: Within functional limits for tasks assessed  SENSATION: WFL  POSTURE: rounded shoulders  PALPATION: TTP and trigger points along bilateral upper traps    CERVICAL ROM:   Active ROM A/PROM (deg) eval   Flexion WFL WFL  Extension 50% w/pain WFL  Right lateral flexion Mod tightness with pain Limited 25%   Left lateral flexion Very tight with pain Limited 50%  Right rotation 50% with pain Limited 25%  Left rotation 50% with pain WFL    (Blank rows = not tested)  UPPER EXTREMITY ROM: grossly WFL   UPPER EXTREMITY MMT: grossly 5/5   L KNEE ROM: knee extension 10d, knee flexion 98d  FUNCTIONAL TESTS:  5 times sit to stand: 20.88s  TODAY'S TREATMENT:                                                                                                                              DATE: 07/16/22 Bike L3 x 5 min UBE L 2 x 2 min each  HS curls 25lb 2x15 Shoulder ER green 2x10 S2S OHP yellow ball 2x10 Horiz abd green 2x10 STM UT and cervical paraspinales    07/05/22 Bike L# x 4 min STM UT and cervical paraspinales  Leg press 50lb 2x15 Rows & Lats 35lb 2x12 Cervical retractions 2x10 Chest press 15lb 21x0 Hamstring Curls 25lb 2x10 Leg Ext 10lb 2x10 Step ups LLE x10  07/03/22 NuStep L 5 x 5 min UBE L 2 x 2 min each Leg press 40lb 2x15 Rows & Lats 25lb 2x12 STM UT and cervical paraspinales  Resisted gait 30lb all directions x3  Shoulder Ext on airex 10lb 2x10   06/28/22 UBE L# x6 mins Horizontal abd green 2x10 Lats and rows 25# 2x10  Shoulder ext 10# 2x10 Standing rows 10# 2x10 STM to neck, passive stretching NuStep L5 x69mins  Leg ext 5# x10, LLE only x5  HS curls 20# x10, LLE only x5    06/26/22 NuStep L 5 x 6 min  MT STM to UT & Cervical spine  PROM to cervical spine with end range holds Leg press 30lb 3x10 Shoulder Ext 10lb standing on Airex 2x10 Standing rows 10lb 2x10  6in step ups x10 each  Shoulder ER red 2x10 S2S OHP yellow ball x10   06/21/22- EVAL   PATIENT EDUCATION:  Education details: HEP and POC Person educated: Patient Education method: Explanation Education comprehension: verbalized understanding  HOME EXERCISE PROGRAM: Access Code: [email or web address] URL: [email or web address] Date: 06/21/2022 Prepared by: Cassie Freer  Exercises - Seated Upper Trapezius Stretch  - 2 x daily - 7 x weekly - 30 hold - Seated Levator Scapulae Stretch  - 2 x daily - 7 x weekly - 30 hold -  Doorway Pec Stretch at 90 Degrees Abduction  - 2 x daily - 7 x weekly - 30 hold -Self massage with Tennis Ball for trigger point release   ASSESSMENT:  CLINICAL IMPRESSION: Patient enters doing well. Pt has progressed increasing his cervical spine and L knee AROM in all directions. Some pulling with cervical rotation reported. He continues to progress towards goals. Some tightness noted with the UT.  Increase reps tolerated with leg curls and extensions. Positive response to STM. No reports of pain during session.   OBJECTIVE IMPAIRMENTS: decreased ROM, increased fascial restrictions, impaired flexibility, and pain.   REHAB POTENTIAL: Good  CLINICAL DECISION MAKING: Stable/uncomplicated  EVALUATION COMPLEXITY: Low   GOALS: Goals reviewed with patient? Yes  SHORT TERM GOALS: Target date: 07/26/22  Patient will be independent with initial HEP.  Goal status: Met 07/05/22  2.   Patient will demonstrate improved functional LE strength as demonstrated by 5xSTS < 12s without UE support. Baseline: 20.88s with UE push off and pain Goal status: Progressing 14.03 07/05/22   LONG TERM GOALS: Target date: 08/30/22  Patient will be independent with advanced/ongoing HEP to improve outcomes and carryover.  Goal status: INITIAL  2.  Patient will report 75% improvement in neck pain to improve QOL.  Baseline: 4-8/10 with movement Goal status: Progressing 40% 07/05/22  3.  Patient will demonstrate full pain free cervical ROM for safety with driving.  Baseline: see chart above Goal status: INITIAL  4. Patient will demonstrate improved L knee AROM to >/= 0-120 deg to allow for normal gait and stair mechanics. Baseline: 10-0-98 Goal status: 6- 125 Progressing 07/16/22  PLAN:  PT FREQUENCY: 2x/week  PT DURATION: 10 weeks  PLANNED INTERVENTIONS: Therapeutic exercises, Therapeutic activity, Neuromuscular re-education, Balance training, Gait training, Patient/Family education, Self Care, Joint  mobilization, Dry Needling, Cryotherapy, Moist heat, Vasopneumatic device, Traction, and Manual therapy  PLAN FOR NEXT SESSION: Dry needling for bilateral UT, stretching neck, work on knee strengthening and stretching too   Grayce Sessions, PTA 07/16/2022, 8:05 AM

## 2022-07-18 NOTE — Therapy (Signed)
OUTPATIENT PHYSICAL THERAPY CERVICAL TREATMENT   Patient Name: Charles Tate MRN: [id number] DOB:1950/05/10, 72 y.o., male Today's Date: 07/19/2022  END OF SESSION:  PT End of Session - 07/19/22 0930     Visit Number 7    Date for PT Re-Evaluation 08/30/22    PT Start Time 0930    PT Stop Time 1015    PT Time Calculation (min) 45 min    Activity Tolerance Patient tolerated treatment well    Behavior During Therapy WFL for tasks assessed/performed               Past Medical History:  Diagnosis Date   Allergy    Arthritis    left hip    GERD (gastroesophageal reflux disease)    per patient, resolved. 03/15/20   Heart murmur    adolescent rheumatic fever - developed murmur    Hyperlipidemia    Hypertension    RAD (reactive airway disease)    with RTIs   Tubular adenoma    Past Surgical History:  Procedure Laterality Date   COLONOSCOPY     colonoscopy with polypectomy  2013   Dr Noe Gens, [place]   COLONOSCOPY WITH PROPOFOL N/A 07/01/2019   Procedure: COLONOSCOPY WITH PROPOFOL;  Surgeon: Lemar Lofty., MD;  Location: [facility];  Service: Gastroenterology;  Laterality: N/A;   COLONOSCOPY WITH PROPOFOL N/A 05/30/2020   Procedure: COLONOSCOPY WITH PROPOFOL;  Surgeon: Meridee Score Netty Starring., MD;  Location: [facility];  Service: Gastroenterology;  Laterality: N/A;   ENDARTERECTOMY Right 04/11/2020   Procedure: Right Carotid Endarterectomy;  Surgeon: Sherren Kerns, MD;  Location: [facility] OR;  Service: Vascular;  Laterality: Right;   ENDOSCOPIC MUCOSAL RESECTION N/A 07/01/2019   Procedure: ENDOSCOPIC MUCOSAL RESECTION;  Surgeon: Meridee Score Netty Starring., MD;  Location: [facility];  Service: Gastroenterology;  Laterality: N/A;   ENDOSCOPIC MUCOSAL RESECTION N/A 05/30/2020   Procedure: ENDOSCOPIC MUCOSAL RESECTION;  Surgeon: Meridee Score Netty Starring., MD;  Location: [facility];  Service: Gastroenterology;  Laterality: N/A;   HEMOSTASIS CLIP PLACEMENT  07/01/2019    Procedure: HEMOSTASIS CLIP PLACEMENT;  Surgeon: Lemar Lofty., MD;  Location: [facility];  Service: Gastroenterology;;   POLYPECTOMY     POLYPECTOMY  07/01/2019   Procedure: POLYPECTOMY;  Surgeon: Lemar Lofty., MD;  Location: [facility];  Service: Gastroenterology;;   POLYPECTOMY  05/30/2020   Procedure: POLYPECTOMY;  Surgeon: Lemar Lofty., MD;  Location: [facility];  Service: Gastroenterology;;   Sunnie Nielsen LIFTING INJECTION  07/01/2019   Procedure: SUBMUCOSAL LIFTING INJECTION;  Surgeon: Lemar Lofty., MD;  Location: [facility];  Service: Gastroenterology;;   SUBMUCOSAL TATTOO INJECTION  07/01/2019   Procedure: SUBMUCOSAL TATTOO INJECTION;  Surgeon: Lemar Lofty., MD;  Location: [facility];  Service: Gastroenterology;;   WISDOM TOOTH EXTRACTION     Patient Active Problem List   Diagnosis Date Noted   Syncope 05/24/2022   Postnasal drip 05/23/2022   Sinus infection 03/19/2022   COVID 11/13/2021   Nuclear sclerotic cataract of both eyes 11/08/2020   Right shoulder pain 06/27/2020   S/P carotid endarterectomy, right 04/11/2020   Serrated adenoma of colon 03/12/2020   Aortic atherosclerosis (HCC) 02/03/2020   Stenosis of right carotid artery 01/26/2020   Branch retinal artery occlusion, right eye 01/19/2020   Posterior vitreous detachment of both eyes 01/19/2020   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) 08/31/2019   Tear of MCL (medial collateral ligament) of knee, left, initial encounter 08/05/2017   Left inguinal hernia  07/04/2017   Allergic rhinitis 12/28/2015   H/O: rheumatic fever 12/28/2015   Prediabetes 12/27/2015   Rising PSA level 02/21/2014   Personal history of colonic polyps 02/17/2014   BPH with obstruction/lower urinary tract symptoms 05/04/2010   Hyperlipidemia 01/07/2009   Essential hypertension 09/26/2006    PCP: Richardean Sale  REFERRING PROVIDER: Richardean Sale  REFERRING  DIAG:  M54.2 (ICD-10-CM) - Neck pain  S46.819S (ICD-10-CM) - Strain of trapezius muscle, unspecified laterality, sequela    THERAPY DIAG:  Muscle weakness (generalized)  Abnormal posture  Stiffness of left knee, not elsewhere classified  Cervicalgia  Rationale for Evaluation and Treatment: Rehabilitation  ONSET DATE: 05/19/22  SUBJECTIVE:                                                                                                                                                                                                         SUBJECTIVE STATEMENT: Better than it was the other day, was sore after the last session.   Hand dominance: Right  PERTINENT HISTORY:  Strain of trapezius muscle, unspecified laterality, initial encounter -Acute, initial visit - Patient continues to have muscular strains and pains from fall on 05/19/2022 - Reassuring that patient had unremarkable CT cervical spine at ER visit on 05/19/2022 - Patient is likely experiencing whiplash like muscular strains and tension resulting from fall  Closed nondisplaced comminuted fracture of left patella with routine healing, subsequent encounter -Subacute, improving, complicated, subsequent visit - X-ray obtained in clinic.  My interpretation: Interval healing of comminuted fracture of left patella - Patient has significantly improved since fall on 04/05/2022 causing comminuted left knee patellar fracture.  Fracture was treated conservatively with knee immobilization and gradual return to weightbearing as tolerated.  Patient is currently able to ambulate nearly pain-free - Continue physical therapy for stretching and strengthening of quadriceps, hamstrings which have become deconditioned due to our immobilization - May discontinue brace use and use it as needed.  May continue weightbearing as tolerated - May use rest ice compression elevation, Tylenol as needed for pain relief  PAIN:  Are you having pain? Yes: NPRS  scale: 0/10 Pain location: shoulder and neck Pain description: ache, pulling Aggravating factors: turning head  Relieving factors: Advil, Tylenol  PRECAUTIONS: None  WEIGHT BEARING RESTRICTIONS: No  FALLS:  Has patient fallen in last 6 months? Yes. Number of falls 2  LIVING ENVIRONMENT: Lives with: lives with their family and lives with their spouse Lives in: House/apartment Stairs: No Has following equipment at home: Single point cane, Environmental consultant - 2 wheeled, and Crutches  OCCUPATION:  Semi-retired, run a Patent examiner business, sells insurance, and plays in a band   PLOF: Independent  PATIENT GOALS:  be able to turn my head left and right without pain, helping the knee heal, increase strength to support the knee   NEXT MD VISIT: 08/13/22  OBJECTIVE:   DIAGNOSTIC FINDINGS:  Alignment: Normal.   Skull base and vertebrae: No acute fracture. No primary bone lesion or focal pathologic process.   Soft tissues and spinal canal: No prevertebral fluid or swelling. No visible canal hematoma.   Disc levels: Mild anterior bridging osteophytosis of the lower cervical levels as well as mild disc space height loss and osteophytosis of C3-C4, otherwise intact.   Upper chest: Negative.   Other: None.   IMPRESSION: 1. No acute intracranial pathology. 2. Suspect nondisplaced fractures of the nasal bones. No displaced fractures or dislocations of the facial bones. 3. No fracture or static subluxation of the cervical spine.  COGNITION: Overall cognitive status: Within functional limits for tasks assessed  SENSATION: WFL  POSTURE: rounded shoulders  PALPATION: TTP and trigger points along bilateral upper traps    CERVICAL ROM:   Active ROM A/PROM (deg) eval   Flexion WFL WFL  Extension 50% w/pain WFL  Right lateral flexion Mod tightness with pain Limited 25%   Left lateral flexion Very tight with pain Limited 50%  Right rotation 50% with pain Limited 25%  Left rotation  50% with pain WFL   (Blank rows = not tested)  UPPER EXTREMITY ROM: grossly WFL   UPPER EXTREMITY MMT: grossly 5/5   L KNEE ROM: knee extension 10d, knee flexion 98d  FUNCTIONAL TESTS:  5 times sit to stand: 20.88s  TODAY'S TREATMENT:                                                                                                                              DATE: 07/18/22 UBE L3 x8mins  Pec stretch 30s  Shoulder ext 5# 2x10 Cable rows 10# 2x10  Bicep curls 25# 2x10 Tricep ext 35# 2x10 Calf stretch 30s  Calf raises 2x12  OHP yellow ball 2x10  STM to UT's with instrument assistance Passive stretching rotations, upper traps, levator 30s    07/16/22 Bike L3 x 5 min UBE L 2 x 2 min each  HS curls 25lb 2x15 Shoulder ER green 2x10 S2S OHP yellow ball 2x10 Horiz abd green 2x10 STM UT and cervical paraspinales    07/05/22 Bike L# x 4 min STM UT and cervical paraspinales  Leg press 50lb 2x15 Rows & Lats 35lb 2x12 Cervical retractions 2x10 Chest press 15lb 21x0 Hamstring Curls 25lb 2x10 Leg Ext 10lb 2x10 Step ups LLE x10  07/03/22 NuStep L 5 x 5 min UBE L 2 x 2 min each Leg press 40lb 2x15 Rows & Lats 25lb 2x12 STM UT and cervical paraspinales  Resisted gait 30lb all directions x3  Shoulder Ext on airex 10lb 2x10   06/28/22 UBE L#  x6 mins Horizontal abd green 2x10 Lats and rows 25# 2x10  Shoulder ext 10# 2x10 Standing rows 10# 2x10 STM to neck, passive stretching NuStep L5 x41mins  Leg ext 5# x10, LLE only x5  HS curls 20# x10, LLE only x5    06/26/22 NuStep L 5 x 6 min  MT STM to UT & Cervical spine  PROM to cervical spine with end range holds Leg press 30lb 3x10 Shoulder Ext 10lb standing on Airex 2x10 Standing rows 10lb 2x10  6in step ups x10 each  Shoulder ER red 2x10 S2S OHP yellow ball x10   06/21/22- EVAL   PATIENT EDUCATION:  Education details: HEP and POC Person educated: Patient Education method: Explanation Education comprehension:  verbalized understanding  HOME EXERCISE PROGRAM: Access Code: [email or web address] URL: [email or web address] Date: 06/21/2022 Prepared by: Cassie Freer  Exercises - Seated Upper Trapezius Stretch  - 2 x daily - 7 x weekly - 30 hold - Seated Levator Scapulae Stretch  - 2 x daily - 7 x weekly - 30 hold - Doorway Pec Stretch at 90 Degrees Abduction  - 2 x daily - 7 x weekly - 30 hold -Self massage with Tennis Ball for trigger point release   ASSESSMENT:  CLINICAL IMPRESSION: Patient enters doing well, reports some soreness following last visit.  He has a lot of tightness in his upper traps, and trigger points in both. Left one has a really big one, could benefit from dry needling next visit. Positive response to STM, was advised to try putting heat on it to help with tightness.   OBJECTIVE IMPAIRMENTS: decreased ROM, increased fascial restrictions, impaired flexibility, and pain.   REHAB POTENTIAL: Good  CLINICAL DECISION MAKING: Stable/uncomplicated  EVALUATION COMPLEXITY: Low   GOALS: Goals reviewed with patient? Yes  SHORT TERM GOALS: Target date: 07/26/22  Patient will be independent with initial HEP.  Goal status: Met 07/05/22  2.   Patient will demonstrate improved functional LE strength as demonstrated by 5xSTS < 12s without UE support. Baseline: 20.88s with UE push off and pain Goal status: Progressing 14.03 07/05/22   LONG TERM GOALS: Target date: 08/30/22  Patient will be independent with advanced/ongoing HEP to improve outcomes and carryover.  Goal status: INITIAL  2.  Patient will report 75% improvement in neck pain to improve QOL.  Baseline: 4-8/10 with movement Goal status: Progressing 40% 07/05/22  3.  Patient will demonstrate full pain free cervical ROM for safety with driving.  Baseline: see chart above Goal status: INITIAL  4. Patient will demonstrate improved L knee AROM to >/= 0-120 deg to allow for normal gait and stair mechanics. Baseline:  10-0-98 Goal status: 6- 125 Progressing 07/16/22  PLAN:  PT FREQUENCY: 2x/week  PT DURATION: 10 weeks  PLANNED INTERVENTIONS: Therapeutic exercises, Therapeutic activity, Neuromuscular re-education, Balance training, Gait training, Patient/Family education, Self Care, Joint mobilization, Dry Needling, Cryotherapy, Moist heat, Vasopneumatic device, Traction, and Manual therapy  PLAN FOR NEXT SESSION: Dry needling for bilateral UT, stretching neck, work on knee strengthening and stretching too   [employer], PT 07/19/2022, 10:15 AM

## 2022-07-19 ENCOUNTER — Ambulatory Visit: Payer: Medicare HMO

## 2022-07-19 DIAGNOSIS — M6281 Muscle weakness (generalized): Secondary | ICD-10-CM | POA: Diagnosis not present

## 2022-07-19 DIAGNOSIS — R293 Abnormal posture: Secondary | ICD-10-CM

## 2022-07-19 DIAGNOSIS — M542 Cervicalgia: Secondary | ICD-10-CM | POA: Diagnosis not present

## 2022-07-19 DIAGNOSIS — M25662 Stiffness of left knee, not elsewhere classified: Secondary | ICD-10-CM

## 2022-07-19 DIAGNOSIS — R600 Localized edema: Secondary | ICD-10-CM | POA: Diagnosis not present

## 2022-07-23 ENCOUNTER — Encounter: Payer: Self-pay | Admitting: Physical Therapy

## 2022-07-23 ENCOUNTER — Ambulatory Visit: Payer: Medicare HMO | Admitting: Physical Therapy

## 2022-07-23 DIAGNOSIS — M25662 Stiffness of left knee, not elsewhere classified: Secondary | ICD-10-CM | POA: Diagnosis not present

## 2022-07-23 DIAGNOSIS — M542 Cervicalgia: Secondary | ICD-10-CM

## 2022-07-23 DIAGNOSIS — M6281 Muscle weakness (generalized): Secondary | ICD-10-CM

## 2022-07-23 DIAGNOSIS — R293 Abnormal posture: Secondary | ICD-10-CM

## 2022-07-23 DIAGNOSIS — R600 Localized edema: Secondary | ICD-10-CM | POA: Diagnosis not present

## 2022-07-23 NOTE — Therapy (Signed)
OUTPATIENT PHYSICAL THERAPY CERVICAL TREATMENT   Patient Name: Charles Tate MRN: [id number] DOB:24-Aug-1950, 72 y.o., male Today's Date: 07/23/2022  END OF SESSION:  PT End of Session - 07/23/22 0806     Visit Number 8    Date for PT Re-Evaluation 08/30/22    PT Start Time 0804    PT Stop Time 0845    PT Time Calculation (min) 41 min    Activity Tolerance Patient tolerated treatment well    Behavior During Therapy [facility] for tasks assessed/performed               Past Medical History:  Diagnosis Date   Allergy    Arthritis    left hip    GERD (gastroesophageal reflux disease)    per patient, resolved. 03/15/20   Heart murmur    adolescent rheumatic fever - developed murmur    Hyperlipidemia    Hypertension    RAD (reactive airway disease)    with RTIs   Tubular adenoma    Past Surgical History:  Procedure Laterality Date   COLONOSCOPY     colonoscopy with polypectomy  2013   Dr Noe Gens, [place]   COLONOSCOPY WITH PROPOFOL N/A 07/01/2019   Procedure: COLONOSCOPY WITH PROPOFOL;  Surgeon: Lemar Lofty., MD;  Location: [facility];  Service: Gastroenterology;  Laterality: N/A;   COLONOSCOPY WITH PROPOFOL N/A 05/30/2020   Procedure: COLONOSCOPY WITH PROPOFOL;  Surgeon: Meridee Score Netty Starring., MD;  Location: [facility];  Service: Gastroenterology;  Laterality: N/A;   ENDARTERECTOMY Right 04/11/2020   Procedure: Right Carotid Endarterectomy;  Surgeon: Sherren Kerns, MD;  Location: [facility] OR;  Service: Vascular;  Laterality: Right;   ENDOSCOPIC MUCOSAL RESECTION N/A 07/01/2019   Procedure: ENDOSCOPIC MUCOSAL RESECTION;  Surgeon: Meridee Score Netty Starring., MD;  Location: [facility];  Service: Gastroenterology;  Laterality: N/A;   ENDOSCOPIC MUCOSAL RESECTION N/A 05/30/2020   Procedure: ENDOSCOPIC MUCOSAL RESECTION;  Surgeon: Meridee Score Netty Starring., MD;  Location: [facility];  Service: Gastroenterology;  Laterality: N/A;   HEMOSTASIS CLIP PLACEMENT  07/01/2019    Procedure: HEMOSTASIS CLIP PLACEMENT;  Surgeon: Lemar Lofty., MD;  Location: [facility];  Service: Gastroenterology;;   POLYPECTOMY     POLYPECTOMY  07/01/2019   Procedure: POLYPECTOMY;  Surgeon: Lemar Lofty., MD;  Location: [facility];  Service: Gastroenterology;;   POLYPECTOMY  05/30/2020   Procedure: POLYPECTOMY;  Surgeon: Lemar Lofty., MD;  Location: [facility];  Service: Gastroenterology;;   Sunnie Nielsen LIFTING INJECTION  07/01/2019   Procedure: SUBMUCOSAL LIFTING INJECTION;  Surgeon: Lemar Lofty., MD;  Location: [facility];  Service: Gastroenterology;;   SUBMUCOSAL TATTOO INJECTION  07/01/2019   Procedure: SUBMUCOSAL TATTOO INJECTION;  Surgeon: Lemar Lofty., MD;  Location: [facility];  Service: Gastroenterology;;   WISDOM TOOTH EXTRACTION     Patient Active Problem List   Diagnosis Date Noted   Syncope 05/24/2022   Postnasal drip 05/23/2022   Sinus infection 03/19/2022   COVID 11/13/2021   Nuclear sclerotic cataract of both eyes 11/08/2020   Right shoulder pain 06/27/2020   S/P carotid endarterectomy, right 04/11/2020   Serrated adenoma of colon 03/12/2020   Aortic atherosclerosis (HCC) 02/03/2020   Stenosis of right carotid artery 01/26/2020   Branch retinal artery occlusion, right eye 01/19/2020   Posterior vitreous detachment of both eyes 01/19/2020   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) 08/31/2019   Tear of MCL (medial collateral ligament) of knee, left, initial encounter 08/05/2017   Left inguinal hernia  07/04/2017   Allergic rhinitis 12/28/2015   H/O: rheumatic fever 12/28/2015   Prediabetes 12/27/2015   Rising PSA level 02/21/2014   Personal history of colonic polyps 02/17/2014   BPH with obstruction/lower urinary tract symptoms 05/04/2010   Hyperlipidemia 01/07/2009   Essential hypertension 09/26/2006    PCP: Richardean Sale  REFERRING PROVIDER: Richardean Sale  REFERRING  DIAG:  M54.2 (ICD-10-CM) - Neck pain  S46.819S (ICD-10-CM) - Strain of trapezius muscle, unspecified laterality, sequela    THERAPY DIAG:  Muscle weakness (generalized)  Stiffness of left knee, not elsewhere classified  Cervicalgia  Abnormal posture  Rationale for Evaluation and Treatment: Rehabilitation  ONSET DATE: 05/19/22  SUBJECTIVE:                                                                                                                                                                                                         SUBJECTIVE STATEMENT: Knee is getting better and stronger. Neck is sore  Hand dominance: Right  PERTINENT HISTORY:  Strain of trapezius muscle, unspecified laterality, initial encounter -Acute, initial visit - Patient continues to have muscular strains and pains from fall on 05/19/2022 - Reassuring that patient had unremarkable CT cervical spine at ER visit on 05/19/2022 - Patient is likely experiencing whiplash like muscular strains and tension resulting from fall  Closed nondisplaced comminuted fracture of left patella with routine healing, subsequent encounter -Subacute, improving, complicated, subsequent visit - X-ray obtained in clinic.  My interpretation: Interval healing of comminuted fracture of left patella - Patient has significantly improved since fall on 04/05/2022 causing comminuted left knee patellar fracture.  Fracture was treated conservatively with knee immobilization and gradual return to weightbearing as tolerated.  Patient is currently able to ambulate nearly pain-free - Continue physical therapy for stretching and strengthening of quadriceps, hamstrings which have become deconditioned due to our immobilization - May discontinue brace use and use it as needed.  May continue weightbearing as tolerated - May use rest ice compression elevation, Tylenol as needed for pain relief  PAIN:  Are you having pain? Yes: NPRS scale: 0/10 Pain  location: shoulder and neck Pain description: ache, pulling Aggravating factors: turning head  Relieving factors: Advil, Tylenol  PRECAUTIONS: None  WEIGHT BEARING RESTRICTIONS: No  FALLS:  Has patient fallen in last 6 months? Yes. Number of falls 2  LIVING ENVIRONMENT: Lives with: lives with their family and lives with their spouse Lives in: House/apartment Stairs: No Has following equipment at home: Single point cane, Environmental consultant - 2 wheeled, and Crutches  OCCUPATION: Semi-retired, run a Patent examiner  business, sells insurance, and plays in a band   PLOF: Independent  PATIENT GOALS:  be able to turn my head left and right without pain, helping the knee heal, increase strength to support the knee   NEXT MD VISIT: 08/13/22  OBJECTIVE:   DIAGNOSTIC FINDINGS:  Alignment: Normal.   Skull base and vertebrae: No acute fracture. No primary bone lesion or focal pathologic process.   Soft tissues and spinal canal: No prevertebral fluid or swelling. No visible canal hematoma.   Disc levels: Mild anterior bridging osteophytosis of the lower cervical levels as well as mild disc space height loss and osteophytosis of C3-C4, otherwise intact.   Upper chest: Negative.   Other: None.   IMPRESSION: 1. No acute intracranial pathology. 2. Suspect nondisplaced fractures of the nasal bones. No displaced fractures or dislocations of the facial bones. 3. No fracture or static subluxation of the cervical spine.  COGNITION: Overall cognitive status: Within functional limits for tasks assessed  SENSATION: WFL  POSTURE: rounded shoulders  PALPATION: TTP and trigger points along bilateral upper traps    CERVICAL ROM:   Active ROM A/PROM (deg) eval   Flexion WFL WFL  Extension 50% w/pain WFL  Right lateral flexion Mod tightness with pain Limited 25%   Left lateral flexion Very tight with pain Limited 50%  Right rotation 50% with pain Limited 25%  Left rotation 50% with pain WFL    (Blank rows = not tested)  UPPER EXTREMITY ROM: grossly WFL   UPPER EXTREMITY MMT: grossly 5/5   L KNEE ROM: knee extension 10d, knee flexion 98d  FUNCTIONAL TESTS:  5 times sit to stand: 20.88s  TODAY'S TREATMENT:                                                                                                                              DATE: 07/23/22 NuStep L5 x 6 min 6in step ups from airex to 6in box on airex x10 each Shoulder ext 10# 2x10 Lead PT M.Albright DN UT & occipitals Shoulder Er red 2x10 Seated OHP yellow ball 2x10 Hamstring Curls 25lb 2x15 Leg Ext 10lb 2x10  07/18/22 UBE L3 x38mins  Pec stretch 30s  Shoulder ext 5# 2x10 Cable rows 10# 2x10  Bicep curls 25# 2x10 Tricep ext 35# 2x10 Calf stretch 30s  Calf raises 2x12  OHP yellow ball 2x10  STM to UT's with instrument assistance Passive stretching rotations, upper traps, levator 30s    07/16/22 Bike L3 x 5 min UBE L 2 x 2 min each  HS curls 25lb 2x15 Shoulder ER green 2x10 S2S OHP yellow ball 2x10 Horiz abd green 2x10 STM UT and cervical paraspinales    07/05/22 Bike L# x 4 min STM UT and cervical paraspinales  Leg press 50lb 2x15 Rows & Lats 35lb 2x12 Cervical retractions 2x10 Chest press 15lb 21x0 Hamstring Curls 25lb 2x10 Leg Ext 10lb 2x10 Step ups LLE x10  07/03/22 NuStep L 5 x  5 min UBE L 2 x 2 min each Leg press 40lb 2x15 Rows & Lats 25lb 2x12 STM UT and cervical paraspinales  Resisted gait 30lb all directions x3  Shoulder Ext on airex 10lb 2x10   06/28/22 UBE L# x6 mins Horizontal abd green 2x10 Lats and rows 25# 2x10  Shoulder ext 10# 2x10 Standing rows 10# 2x10 STM to neck, passive stretching NuStep L5 x71mins  Leg ext 5# x10, LLE only x5  HS curls 20# x10, LLE only x5    06/26/22 NuStep L 5 x 6 min  MT STM to UT & Cervical spine  PROM to cervical spine with end range holds Leg press 30lb 3x10 Shoulder Ext 10lb standing on Airex 2x10 Standing rows 10lb 2x10  6in step  ups x10 each  Shoulder ER red 2x10 S2S OHP yellow ball x10   06/21/22- EVAL   PATIENT EDUCATION:  Education details: HEP and POC Person educated: Patient Education method: Explanation Education comprehension: verbalized understanding  HOME EXERCISE PROGRAM: Access Code: [email or web address] URL: [email or web address] Date: 06/21/2022 Prepared by: Cassie Freer  Exercises - Seated Upper Trapezius Stretch  - 2 x daily - 7 x weekly - 30 hold - Seated Levator Scapulae Stretch  - 2 x daily - 7 x weekly - 30 hold - Doorway Pec Stretch at 90 Degrees Abduction  - 2 x daily - 7 x weekly - 30 hold -Self massage with Tennis Ball for trigger point release   ASSESSMENT:  CLINICAL IMPRESSION: Patient enters doing well overall.  He has a lot of tightness in his upper traps, and trigger points in both. Lead PT assisted in treatment providing pt with DN. Some instability with step ups using airex requiring min assist to regain balance. Tactiel cue to keep shoulders down and back with shoulder extensions..   OBJECTIVE IMPAIRMENTS: decreased ROM, increased fascial restrictions, impaired flexibility, and pain.   REHAB POTENTIAL: Good  CLINICAL DECISION MAKING: Stable/uncomplicated  EVALUATION COMPLEXITY: Low   GOALS: Goals reviewed with patient? Yes  SHORT TERM GOALS: Target date: 07/26/22  Patient will be independent with initial HEP.  Goal status: Met 07/05/22  2.   Patient will demonstrate improved functional LE strength as demonstrated by 5xSTS < 12s without UE support. Baseline: 20.88s with UE push off and pain Goal status: Progressing 14.03 07/05/22   LONG TERM GOALS: Target date: 08/30/22  Patient will be independent with advanced/ongoing HEP to improve outcomes and carryover.  Goal status: INITIAL  2.  Patient will report 75% improvement in neck pain to improve QOL.  Baseline: 4-8/10 with movement Goal status: Progressing 40% 07/05/22  3.  Patient will demonstrate full pain  free cervical ROM for safety with driving.  Baseline: see chart above Goal status: INITIAL  4. Patient will demonstrate improved L knee AROM to >/= 0-120 deg to allow for normal gait and stair mechanics. Baseline: 10-0-98 Goal status: 6- 125 Progressing 07/16/22  PLAN:  PT FREQUENCY: 2x/week  PT DURATION: 10 weeks  PLANNED INTERVENTIONS: Therapeutic exercises, Therapeutic activity, Neuromuscular re-education, Balance training, Gait training, Patient/Family education, Self Care, Joint mobilization, Dry Needling, Cryotherapy, Moist heat, Vasopneumatic device, Traction, and Manual therapy  PLAN FOR NEXT SESSION: assess Dry needling for bilateral UT, stretching neck, work on knee strengthening and stretching too   Grayce Sessions, PTA 07/23/2022, 8:06 AM

## 2022-07-26 ENCOUNTER — Encounter: Payer: Self-pay | Admitting: Physical Therapy

## 2022-07-26 ENCOUNTER — Ambulatory Visit: Payer: Medicare HMO | Admitting: Physical Therapy

## 2022-07-26 DIAGNOSIS — M25662 Stiffness of left knee, not elsewhere classified: Secondary | ICD-10-CM

## 2022-07-26 DIAGNOSIS — M542 Cervicalgia: Secondary | ICD-10-CM | POA: Diagnosis not present

## 2022-07-26 DIAGNOSIS — R600 Localized edema: Secondary | ICD-10-CM

## 2022-07-26 DIAGNOSIS — M6281 Muscle weakness (generalized): Secondary | ICD-10-CM

## 2022-07-26 DIAGNOSIS — R293 Abnormal posture: Secondary | ICD-10-CM | POA: Diagnosis not present

## 2022-07-26 NOTE — Therapy (Signed)
OUTPATIENT PHYSICAL THERAPY CERVICAL TREATMENT   Patient Name: Charles Tate MRN: [id number] DOB:01-02-1951, 72 y.o., male Today's Date: 07/26/2022  END OF SESSION:  PT End of Session - 07/26/22 0929     Visit Number 9    Date for PT Re-Evaluation 08/30/22    PT Start Time 0930    PT Stop Time 1015    PT Time Calculation (min) 45 min    Activity Tolerance Patient tolerated treatment well    Behavior During Therapy [facility] for tasks assessed/performed               Past Medical History:  Diagnosis Date   Allergy    Arthritis    left hip    GERD (gastroesophageal reflux disease)    per patient, resolved. 03/15/20   Heart murmur    adolescent rheumatic fever - developed murmur    Hyperlipidemia    Hypertension    RAD (reactive airway disease)    with RTIs   Tubular adenoma    Past Surgical History:  Procedure Laterality Date   COLONOSCOPY     colonoscopy with polypectomy  2013   Dr Noe Gens, [place]   COLONOSCOPY WITH PROPOFOL N/A 07/01/2019   Procedure: COLONOSCOPY WITH PROPOFOL;  Surgeon: Lemar Lofty., MD;  Location: [facility];  Service: Gastroenterology;  Laterality: N/A;   COLONOSCOPY WITH PROPOFOL N/A 05/30/2020   Procedure: COLONOSCOPY WITH PROPOFOL;  Surgeon: Meridee Score Netty Starring., MD;  Location: [facility];  Service: Gastroenterology;  Laterality: N/A;   ENDARTERECTOMY Right 04/11/2020   Procedure: Right Carotid Endarterectomy;  Surgeon: Sherren Kerns, MD;  Location: [facility] OR;  Service: Vascular;  Laterality: Right;   ENDOSCOPIC MUCOSAL RESECTION N/A 07/01/2019   Procedure: ENDOSCOPIC MUCOSAL RESECTION;  Surgeon: Meridee Score Netty Starring., MD;  Location: [facility];  Service: Gastroenterology;  Laterality: N/A;   ENDOSCOPIC MUCOSAL RESECTION N/A 05/30/2020   Procedure: ENDOSCOPIC MUCOSAL RESECTION;  Surgeon: Meridee Score Netty Starring., MD;  Location: [facility];  Service: Gastroenterology;  Laterality: N/A;   HEMOSTASIS CLIP PLACEMENT  07/01/2019    Procedure: HEMOSTASIS CLIP PLACEMENT;  Surgeon: Lemar Lofty., MD;  Location: [facility];  Service: Gastroenterology;;   POLYPECTOMY     POLYPECTOMY  07/01/2019   Procedure: POLYPECTOMY;  Surgeon: Lemar Lofty., MD;  Location: [facility];  Service: Gastroenterology;;   POLYPECTOMY  05/30/2020   Procedure: POLYPECTOMY;  Surgeon: Lemar Lofty., MD;  Location: [facility];  Service: Gastroenterology;;   Sunnie Nielsen LIFTING INJECTION  07/01/2019   Procedure: SUBMUCOSAL LIFTING INJECTION;  Surgeon: Lemar Lofty., MD;  Location: [facility];  Service: Gastroenterology;;   SUBMUCOSAL TATTOO INJECTION  07/01/2019   Procedure: SUBMUCOSAL TATTOO INJECTION;  Surgeon: Lemar Lofty., MD;  Location: [facility];  Service: Gastroenterology;;   WISDOM TOOTH EXTRACTION     Patient Active Problem List   Diagnosis Date Noted   Syncope 05/24/2022   Postnasal drip 05/23/2022   Sinus infection 03/19/2022   COVID 11/13/2021   Nuclear sclerotic cataract of both eyes 11/08/2020   Right shoulder pain 06/27/2020   S/P carotid endarterectomy, right 04/11/2020   Serrated adenoma of colon 03/12/2020   Aortic atherosclerosis (HCC) 02/03/2020   Stenosis of right carotid artery 01/26/2020   Branch retinal artery occlusion, right eye 01/19/2020   Posterior vitreous detachment of both eyes 01/19/2020   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) 08/31/2019   Tear of MCL (medial collateral ligament) of knee, left, initial encounter 08/05/2017   Left inguinal hernia  07/04/2017   Allergic rhinitis 12/28/2015   H/O: rheumatic fever 12/28/2015   Prediabetes 12/27/2015   Rising PSA level 02/21/2014   Personal history of colonic polyps 02/17/2014   BPH with obstruction/lower urinary tract symptoms 05/04/2010   Hyperlipidemia 01/07/2009   Essential hypertension 09/26/2006    PCP: Richardean Sale  REFERRING PROVIDER: Richardean Sale  REFERRING  DIAG:  M54.2 (ICD-10-CM) - Neck pain  S46.819S (ICD-10-CM) - Strain of trapezius muscle, unspecified laterality, sequela    THERAPY DIAG:  Muscle weakness (generalized)  Stiffness of left knee, not elsewhere classified  Cervicalgia  Abnormal posture  Localized edema  Rationale for Evaluation and Treatment: Rehabilitation  ONSET DATE: 05/19/22  SUBJECTIVE:                                                                                                                                                                                                         SUBJECTIVE STATEMENT: "Im getting their" Soreness in the neck from the DN. 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  Hand dominance: Right  PERTINENT HISTORY:  Strain of trapezius muscle, unspecified laterality, initial encounter -Acute, initial visit - Patient continues to have muscular strains and pains from fall on 05/19/2022 - Reassuring that patient had unremarkable CT cervical spine at ER visit on 05/19/2022 - Patient is likely experiencing whiplash like muscular strains and tension resulting from fall  Closed nondisplaced comminuted fracture of left patella with routine healing, subsequent encounter -Subacute, improving, complicated, subsequent visit - X-ray obtained in clinic.  My interpretation: Interval healing of comminuted fracture of left patella - Patient has significantly improved since fall on 04/05/2022 causing comminuted left knee patellar fracture.  Fracture was treated conservatively with knee immobilization and gradual return to weightbearing as tolerated.  Patient is currently able to ambulate nearly pain-free - Continue physical therapy for stretching and strengthening of quadriceps, hamstrings which have become deconditioned due to our immobilization - May discontinue brace use and use it as needed.  May continue weightbearing as tolerated - May use rest ice compression  elevation, Tylenol as needed for pain relief  PAIN:  Are you having pain? Yes: NPRS scale: 0/10 Pain location: shoulder and neck Pain description: ache, pulling Aggravating factors: turning head  Relieving factors: Advil, Tylenol  PRECAUTIONS: None  WEIGHT BEARING RESTRICTIONS: No  FALLS:  Has patient fallen in last 6 months? Yes. Number of falls 2  LIVING ENVIRONMENT: Lives with: lives with their family and lives with their spouse Lives in: House/apartment Stairs: No Has following equipment  at home: Single point cane, Walker - 2 wheeled, and Crutches  OCCUPATION: Semi-retired, run a Patent examiner business, sells insurance, and plays in a band   PLOF: Independent  PATIENT GOALS:  be able to turn my head left and right without pain, helping the knee heal, increase strength to support the knee   NEXT MD VISIT: 08/13/22  OBJECTIVE:   DIAGNOSTIC FINDINGS:  Alignment: Normal.   Skull base and vertebrae: No acute fracture. No primary bone lesion or focal pathologic process.   Soft tissues and spinal canal: No prevertebral fluid or swelling. No visible canal hematoma.   Disc levels: Mild anterior bridging osteophytosis of the lower cervical levels as well as mild disc space height loss and osteophytosis of C3-C4, otherwise intact.   Upper chest: Negative.   Other: None.   IMPRESSION: 1. No acute intracranial pathology. 2. Suspect nondisplaced fractures of the nasal bones. No displaced fractures or dislocations of the facial bones. 3. No fracture or static subluxation of the cervical spine.  COGNITION: Overall cognitive status: Within functional limits for tasks assessed  SENSATION: WFL  POSTURE: rounded shoulders  PALPATION: TTP and trigger points along bilateral upper traps    CERVICAL ROM:   Active ROM A/PROM (deg) eval  07/26/22  Flexion WFL WFL WFL  Extension 50% w/pain [facility] WFL  Right lateral flexion Mod tightness with pain Limited 25%  Limited 25%   Left lateral flexion Very tight with pain Limited 50% Limited 25%  Right rotation 50% with pain Limited 25% Limited 25%  Left rotation 50% with pain WFL WFL   (Blank rows = not tested)  UPPER EXTREMITY ROM: grossly WFL   UPPER EXTREMITY MMT: grossly 5/5   L KNEE ROM: knee extension 10d, knee flexion 98d  FUNCTIONAL TESTS:  5 times sit to stand: 20.88s  TODAY'S TREATMENT:                                                                                                                              DATE: 07/26/22 Bike L 3.5 x 6 min STM to UT & cervical paraspinales Seated rows & Lats 25lb 2x12 Cervical retractions red 2x10 6in box on airex step ups x 10 each Horiz Abd green 2x10 Shoulder Flex 4lb 2x10   Shoulder ER red 2x10   07/23/22 NuStep L5 x 6 min 6in step ups from airex to 6in box on airex x10 each Shoulder ext 10# 2x10 Lead PT M.Albright DN UT & occipitals Shoulder Er red 2x10 Seated OHP yellow ball 2x10 Hamstring Curls 25lb 2x15 Leg Ext 10lb 2x10  07/18/22 UBE L3 x20mins  Pec stretch 30s  Shoulder ext 5# 2x10 Cable rows 10# 2x10  Bicep curls 25# 2x10 Tricep ext 35# 2x10 Calf stretch 30s  Calf raises 2x12  OHP yellow ball 2x10  STM to UT's with instrument assistance Passive stretching rotations, upper traps, levator 30s    07/16/22 Bike L3 x 5 min UBE L 2  x 2 min each  HS curls 25lb 2x15 Shoulder ER green 2x10 S2S OHP yellow ball 2x10 Horiz abd green 2x10 STM UT and cervical paraspinales    07/05/22 Bike L# x 4 min STM UT and cervical paraspinales  Leg press 50lb 2x15 Rows & Lats 35lb 2x12 Cervical retractions 2x10 Chest press 15lb 21x0 Hamstring Curls 25lb 2x10 Leg Ext 10lb 2x10 Step ups LLE x10  07/03/22 NuStep L 5 x 5 min UBE L 2 x 2 min each Leg press 40lb 2x15 Rows & Lats 25lb 2x12 STM UT and cervical paraspinales  Resisted gait 30lb all directions x3  Shoulder Ext on airex 10lb 2x10   06/28/22 UBE L# x6 mins Horizontal abd green  2x10 Lats and rows 25# 2x10  Shoulder ext 10# 2x10 Standing rows 10# 2x10 STM to neck, passive stretching NuStep L5 x24mins  Leg ext 5# x10, LLE only x5  HS curls 20# x10, LLE only x5    06/26/22 NuStep L 5 x 6 min  MT STM to UT & Cervical spine  PROM to cervical spine with end range holds Leg press 30lb 3x10 Shoulder Ext 10lb standing on Airex 2x10 Standing rows 10lb 2x10  6in step ups x10 each  Shoulder ER red 2x10 S2S OHP yellow ball x10   06/21/22- EVAL   PATIENT EDUCATION:  Education details: HEP and POC Person educated: Patient Education method: Explanation Education comprehension: verbalized understanding  HOME EXERCISE PROGRAM: Access Code: [email or web address] URL: [email or web address] Date: 06/21/2022 Prepared by: Cassie Freer  Exercises - Seated Upper Trapezius Stretch  - 2 x daily - 7 x weekly - 30 hold - Seated Levator Scapulae Stretch  - 2 x daily - 7 x weekly - 30 hold - Doorway Pec Stretch at 90 Degrees Abduction  - 2 x daily - 7 x weekly - 30 hold -Self massage with Tennis Ball for trigger point release   ASSESSMENT:  CLINICAL IMPRESSION: Patient enters doing well overall, he does report some tightness and soreness in UT. Despite reports pt did increase his cervical ROM some. Some instability with step ups using airex. Tactiel cue to keep shoulders down and back with shoulder  external rotation and seated rows. Tactile cue needed for cervical retraction to prevent cervical extension. Some burning with ER and horiz Abd. ..   OBJECTIVE IMPAIRMENTS: decreased ROM, increased fascial restrictions, impaired flexibility, and pain.   REHAB POTENTIAL: Good  CLINICAL DECISION MAKING: Stable/uncomplicated  EVALUATION COMPLEXITY: Low   GOALS: Goals reviewed with patient? Yes  SHORT TERM GOALS: Target date: 07/26/22  Patient will be independent with initial HEP.  Goal status: Met 07/05/22  2.   Patient will demonstrate improved functional LE strength as  demonstrated by 5xSTS < 12s without UE support. Baseline: 20.88s with UE push off and pain Goal status: Progressing 14.03 07/05/22   LONG TERM GOALS: Target date: 08/30/22  Patient will be independent with advanced/ongoing HEP to improve outcomes and carryover.  Goal status: INITIAL  2.  Patient will report 75% improvement in neck pain to improve QOL.  Baseline: 4-8/10 with movement Goal status: Progressing 50% 07/26/22  3.  Patient will demonstrate full pain free cervical ROM for safety with driving.  Baseline: see chart above Goal status: INITIAL  4. Patient will demonstrate improved L knee AROM to >/= 0-120 deg to allow for normal gait and stair mechanics. Baseline: 10-0-98 Goal status: 6- 125 Progressing 07/16/22  PLAN:  PT FREQUENCY: 2x/week  PT DURATION: 10 weeks  PLANNED INTERVENTIONS: Therapeutic  exercises, Therapeutic activity, Neuromuscular re-education, Balance training, Gait training, Patient/Family education, Self Care, Joint mobilization, Dry Needling, Cryotherapy, Moist heat, Vasopneumatic device, Traction, and Manual therapy  PLAN FOR NEXT SESSION: assess Dry needling for bilateral UT, stretching neck, work on knee strengthening and stretching too   Grayce Sessions, PTA 07/26/2022, 9:30 AM

## 2022-08-06 ENCOUNTER — Ambulatory Visit: Payer: Medicare HMO | Attending: Sports Medicine | Admitting: Physical Therapy

## 2022-08-06 ENCOUNTER — Encounter: Payer: Self-pay | Admitting: Physical Therapy

## 2022-08-06 DIAGNOSIS — M25662 Stiffness of left knee, not elsewhere classified: Secondary | ICD-10-CM | POA: Insufficient documentation

## 2022-08-06 DIAGNOSIS — M542 Cervicalgia: Secondary | ICD-10-CM | POA: Diagnosis not present

## 2022-08-06 DIAGNOSIS — R600 Localized edema: Secondary | ICD-10-CM | POA: Insufficient documentation

## 2022-08-06 DIAGNOSIS — M6281 Muscle weakness (generalized): Secondary | ICD-10-CM | POA: Diagnosis not present

## 2022-08-06 DIAGNOSIS — R293 Abnormal posture: Secondary | ICD-10-CM | POA: Diagnosis not present

## 2022-08-06 NOTE — Therapy (Signed)
OUTPATIENT PHYSICAL THERAPY CERVICAL TREATMENT  Progress Note Reporting Period 06/21/22 to 08/06/22  See note below for Objective Data and Assessment of Progress/Goals.     Patient Name: Charles Tate MRN: [id number] DOB:September 13, 1950, 72 y.o., male Today's Date: 08/06/2022  END OF SESSION:  PT End of Session - 08/06/22 0806     Visit Number 10    Date for PT Re-Evaluation 08/30/22    PT Start Time 0803    PT Stop Time 0845    PT Time Calculation (min) 42 min    Activity Tolerance Patient tolerated treatment well    Behavior During Therapy [employer] for tasks assessed/performed               Past Medical History:  Diagnosis Date   Allergy    Arthritis    left hip    GERD (gastroesophageal reflux disease)    per patient, resolved. 03/15/20   Heart murmur    adolescent rheumatic fever - developed murmur    Hyperlipidemia    Hypertension    RAD (reactive airway disease)    with RTIs   Tubular adenoma    Past Surgical History:  Procedure Laterality Date   COLONOSCOPY     colonoscopy with polypectomy  2013   Dr Noe Gens, [place]   COLONOSCOPY WITH PROPOFOL N/A 07/01/2019   Procedure: COLONOSCOPY WITH PROPOFOL;  Surgeon: Lemar Lofty., MD;  Location: [facility];  Service: Gastroenterology;  Laterality: N/A;   COLONOSCOPY WITH PROPOFOL N/A 05/30/2020   Procedure: COLONOSCOPY WITH PROPOFOL;  Surgeon: Meridee Score Netty Starring., MD;  Location: [facility];  Service: Gastroenterology;  Laterality: N/A;   ENDARTERECTOMY Right 04/11/2020   Procedure: Right Carotid Endarterectomy;  Surgeon: Sherren Kerns, MD;  Location: [facility] OR;  Service: Vascular;  Laterality: Right;   ENDOSCOPIC MUCOSAL RESECTION N/A 07/01/2019   Procedure: ENDOSCOPIC MUCOSAL RESECTION;  Surgeon: Meridee Score Netty Starring., MD;  Location: [facility];  Service: Gastroenterology;  Laterality: N/A;   ENDOSCOPIC MUCOSAL RESECTION N/A 05/30/2020   Procedure: ENDOSCOPIC MUCOSAL RESECTION;  Surgeon: Meridee Score  Netty Starring., MD;  Location: [facility];  Service: Gastroenterology;  Laterality: N/A;   HEMOSTASIS CLIP PLACEMENT  07/01/2019   Procedure: HEMOSTASIS CLIP PLACEMENT;  Surgeon: Lemar Lofty., MD;  Location: [facility];  Service: Gastroenterology;;   POLYPECTOMY     POLYPECTOMY  07/01/2019   Procedure: POLYPECTOMY;  Surgeon: Lemar Lofty., MD;  Location: [facility];  Service: Gastroenterology;;   POLYPECTOMY  05/30/2020   Procedure: POLYPECTOMY;  Surgeon: Lemar Lofty., MD;  Location: [facility];  Service: Gastroenterology;;   SUBMUCOSAL LIFTING INJECTION  07/01/2019   Procedure: SUBMUCOSAL LIFTING INJECTION;  Surgeon: Lemar Lofty., MD;  Location: [facility];  Service: Gastroenterology;;   SUBMUCOSAL TATTOO INJECTION  07/01/2019   Procedure: SUBMUCOSAL TATTOO INJECTION;  Surgeon: Lemar Lofty., MD;  Location: [facility];  Service: Gastroenterology;;   WISDOM TOOTH EXTRACTION     Patient Active Problem List   Diagnosis Date Noted   Syncope 05/24/2022   Postnasal drip 05/23/2022   Sinus infection 03/19/2022   COVID 11/13/2021   Nuclear sclerotic cataract of both eyes 11/08/2020   Right shoulder pain 06/27/2020   S/P carotid endarterectomy, right 04/11/2020   Serrated adenoma of colon 03/12/2020   Aortic atherosclerosis (HCC) 02/03/2020   Stenosis of right carotid artery 01/26/2020   Branch retinal artery occlusion, right eye 01/19/2020   Posterior vitreous detachment of both eyes 01/19/2020   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) 08/31/2019   Tear of MCL (medial collateral ligament) of knee, left, initial encounter 08/05/2017   Left inguinal hernia 07/04/2017   Allergic rhinitis 12/28/2015   H/O: rheumatic fever 12/28/2015   Prediabetes 12/27/2015   Rising PSA level 02/21/2014   Personal history of colonic polyps 02/17/2014   BPH with obstruction/lower urinary tract symptoms 05/04/2010   Hyperlipidemia  01/07/2009   Essential hypertension 09/26/2006    PCP: Richardean Sale  REFERRING PROVIDER: Richardean Sale  REFERRING DIAG:  M54.2 (ICD-10-CM) - Neck pain  S46.819S (ICD-10-CM) - Strain of trapezius muscle, unspecified laterality, sequela    THERAPY DIAG:  Muscle weakness (generalized)  Cervicalgia  Stiffness of left knee, not elsewhere classified  Abnormal posture  Rationale for Evaluation and Treatment: Rehabilitation  ONSET DATE: 05/19/22  SUBJECTIVE:                                                                                                                                                                                                         SUBJECTIVE STATEMENT: "I feel good"  Hand dominance: Right  PERTINENT HISTORY:  Strain of trapezius muscle, unspecified laterality, initial encounter -Acute, initial visit - Patient continues to have muscular strains and pains from fall on 05/19/2022 - Reassuring that patient had unremarkable CT cervical spine at ER visit on 05/19/2022 - Patient is likely experiencing whiplash like muscular strains and tension resulting from fall  Closed nondisplaced comminuted fracture of left patella with routine healing, subsequent encounter -Subacute, improving, complicated, subsequent visit - X-ray obtained in clinic.  My interpretation: Interval healing of comminuted fracture of left patella - Patient has significantly improved since fall on 04/05/2022 causing comminuted left knee patellar fracture.  Fracture was treated conservatively with knee immobilization and gradual return to weightbearing as tolerated.  Patient is currently able to ambulate nearly pain-free - Continue physical therapy for stretching and strengthening of quadriceps, hamstrings which have become deconditioned due to our immobilization - May discontinue brace use and use it as needed.  May continue weightbearing as tolerated - May use rest ice compression elevation,  Tylenol as needed for pain relief  PAIN:  Are you having pain? Yes: NPRS scale: 0/10 Pain location: shoulder and neck Pain description: ache, pulling Aggravating factors: turning head  Relieving factors: Advil, Tylenol  PRECAUTIONS: None  WEIGHT BEARING RESTRICTIONS: No  FALLS:  Has patient fallen in last 6 months? Yes. Number of falls 2  LIVING ENVIRONMENT: Lives with: lives with their family and lives with their spouse Lives in: House/apartment Stairs: No Has following equipment at home: Single  point cane, Walker - 2 wheeled, and Crutches  OCCUPATION: Semi-retired, run a Patent examiner business, sells insurance, and plays in a band   PLOF: Independent  PATIENT GOALS:  be able to turn my head left and right without pain, helping the knee heal, increase strength to support the knee   NEXT MD VISIT: 08/13/22  OBJECTIVE:   DIAGNOSTIC FINDINGS:  Alignment: Normal.   Skull base and vertebrae: No acute fracture. No primary bone lesion or focal pathologic process.   Soft tissues and spinal canal: No prevertebral fluid or swelling. No visible canal hematoma.   Disc levels: Mild anterior bridging osteophytosis of the lower cervical levels as well as mild disc space height loss and osteophytosis of C3-C4, otherwise intact.   Upper chest: Negative.   Other: None.   IMPRESSION: 1. No acute intracranial pathology. 2. Suspect nondisplaced fractures of the nasal bones. No displaced fractures or dislocations of the facial bones. 3. No fracture or static subluxation of the cervical spine.  COGNITION: Overall cognitive status: Within functional limits for tasks assessed  SENSATION: WFL  POSTURE: rounded shoulders  PALPATION: TTP and trigger points along bilateral upper traps    CERVICAL ROM:   Active ROM A/PROM (deg) eval  07/26/22 08/06/22  Flexion WFL WFL WFL WFL  Extension 50% w/pain [facility] WFL WFL tightness front rite side of nexk  Right lateral flexion Mod tightness  with pain Limited 25%  Limited 25% Limited 25%  Left lateral flexion Very tight with pain Limited 50% Limited 25% WFL  Right rotation 50% with pain Limited 25% Limited 25% Limited 25%  Left rotation 50% with pain [facility] WFL WFL   (Blank rows = not tested)  UPPER EXTREMITY ROM: grossly WFL   UPPER EXTREMITY MMT: grossly 5/5   L KNEE ROM: knee extension 10d, knee flexion 98d  FUNCTIONAL TESTS:  5 times sit to stand: 20.88s  TODAY'S TREATMENT:                                                                                                                              DATE: 08/06/22 NuStep L 5 x 6 min L knee AROM 0-130 S2S OHP yellow  2x10 Lead PT M.Albright DN UT & Cervical para spinales, R SCM Shoulder ER red 2x10  Cervical retractions yellow x10   07/26/22 Bike L 3.5 x 6 min STM to UT & cervical paraspinales Seated rows & Lats 25lb 2x12 Cervical retractions red 2x10 6in box on airex step ups x 10 each Horiz Abd green 2x10 Shoulder Flex 4lb 2x10   Shoulder ER red 2x10   07/23/22 NuStep L5 x 6 min 6in step ups from airex to 6in box on airex x10 each Shoulder ext 10# 2x10 Lead PT M.Albright DN UT & occipitals Shoulder Er red 2x10 Seated OHP yellow ball 2x10 Hamstring Curls 25lb 2x15 Leg Ext 10lb 2x10  07/18/22 UBE L3 x22mins  Pec stretch 30s  Shoulder ext 5# 2x10 Cable  rows 10# 2x10  Bicep curls 25# 2x10 Tricep ext 35# 2x10 Calf stretch 30s  Calf raises 2x12  OHP yellow ball 2x10  STM to UT's with instrument assistance Passive stretching rotations, upper traps, levator 30s    07/16/22 Bike L3 x 5 min UBE L 2 x 2 min each  HS curls 25lb 2x15 Shoulder ER green 2x10 S2S OHP yellow ball 2x10 Horiz abd green 2x10 STM UT and cervical paraspinales    07/05/22 Bike L# x 4 min STM UT and cervical paraspinales  Leg press 50lb 2x15 Rows & Lats 35lb 2x12 Cervical retractions 2x10 Chest press 15lb 21x0 Hamstring Curls 25lb 2x10 Leg Ext 10lb 2x10 Step ups LLE  x10  07/03/22 NuStep L 5 x 5 min UBE L 2 x 2 min each Leg press 40lb 2x15 Rows & Lats 25lb 2x12 STM UT and cervical paraspinales  Resisted gait 30lb all directions x3  Shoulder Ext on airex 10lb 2x10   06/28/22 UBE L# x6 mins Horizontal abd green 2x10 Lats and rows 25# 2x10  Shoulder ext 10# 2x10 Standing rows 10# 2x10 STM to neck, passive stretching NuStep L5 x51mins  Leg ext 5# x10, LLE only x5  HS curls 20# x10, LLE only x5    06/26/22 NuStep L 5 x 6 min  MT STM to UT & Cervical spine  PROM to cervical spine with end range holds Leg press 30lb 3x10 Shoulder Ext 10lb standing on Airex 2x10 Standing rows 10lb 2x10  6in step ups x10 each  Shoulder ER red 2x10 S2S OHP yellow ball x10   06/21/22- EVAL   PATIENT EDUCATION:  Education details: HEP and POC Person educated: Patient Education method: Explanation Education comprehension: verbalized understanding  HOME EXERCISE PROGRAM: Access Code: [email or web address] URL: [email or web address] Date: 06/21/2022 Prepared by: Cassie Freer  Exercises - Seated Upper Trapezius Stretch  - 2 x daily - 7 x weekly - 30 hold - Seated Levator Scapulae Stretch  - 2 x daily - 7 x weekly - 30 hold - Doorway Pec Stretch at 90 Degrees Abduction  - 2 x daily - 7 x weekly - 30 hold -Self massage with Tennis Ball for trigger point release   ASSESSMENT:  CLINICAL IMPRESSION: Patient enters doing well overall. Pt has progressed meeting some long and short term goals. He increased his cervical ROM and decreased his 5x S2S time. Lead PT assisted  in session  providing pt with DN. Postural cue needed with shoulder Er. Pt has progressed increasing his L knee AROM. Some L knee discomfort with S2S OHP.   OBJECTIVE IMPAIRMENTS: decreased ROM, increased fascial restrictions, impaired flexibility, and pain.   REHAB POTENTIAL: Good  CLINICAL DECISION MAKING: Stable/uncomplicated  EVALUATION COMPLEXITY: Low   GOALS: Goals reviewed with  patient? Yes  SHORT TERM GOALS: Target date: 07/26/22  Patient will be independent with initial HEP.  Goal status: Met 07/05/22  2.   Patient will demonstrate improved functional LE strength as demonstrated by 5xSTS < 12s without UE support. Baseline: 20.88s with UE push off and pain Goal status: 9 sec Met 08/06/22   LONG TERM GOALS: Target date: 08/30/22  Patient will be independent with advanced/ongoing HEP to improve outcomes and carryover.  Goal status: Met 08/06/22  2.  Patient will report 75% improvement in neck pain to improve QOL.  Baseline: 4-8/10 with movement Goal status: Progressing 60% 08/06/22  3.  Patient will demonstrate full pain free cervical ROM for safety with driving.  Baseline: see chart above Goal  status: Progressing 08/06/22  4. Patient will demonstrate improved L knee AROM to >/= 0-120 deg to allow for normal gait and stair mechanics. Baseline: 10-0-98 Goal status: Met 08/06/22 PLAN:  PT FREQUENCY: 2x/week  PT DURATION: 10 weeks  PLANNED INTERVENTIONS: Therapeutic exercises, Therapeutic activity, Neuromuscular re-education, Balance training, Gait training, Patient/Family education, Self Care, Joint mobilization, Dry Needling, Cryotherapy, Moist heat, Vasopneumatic device, Traction, and Manual therapy  PLAN FOR NEXT SESSION: assess Dry needling for bilateral UT, stretching neck, work on knee strengthening and stretching too   Grayce Sessions, PTA 08/06/2022, 8:07 AM

## 2022-08-09 ENCOUNTER — Ambulatory Visit: Payer: Medicare HMO | Admitting: Physical Therapy

## 2022-08-09 ENCOUNTER — Encounter: Payer: Self-pay | Admitting: Physical Therapy

## 2022-08-09 DIAGNOSIS — M542 Cervicalgia: Secondary | ICD-10-CM

## 2022-08-09 DIAGNOSIS — R600 Localized edema: Secondary | ICD-10-CM

## 2022-08-09 DIAGNOSIS — M25662 Stiffness of left knee, not elsewhere classified: Secondary | ICD-10-CM | POA: Diagnosis not present

## 2022-08-09 DIAGNOSIS — R293 Abnormal posture: Secondary | ICD-10-CM

## 2022-08-09 DIAGNOSIS — M6281 Muscle weakness (generalized): Secondary | ICD-10-CM | POA: Diagnosis not present

## 2022-08-09 NOTE — Therapy (Signed)
OUTPATIENT PHYSICAL THERAPY CERVICAL TREATMENT     Patient Name: Charles Tate MRN: [id number] DOB:1950/03/14, 72 y.o., male Today's Date: 08/09/2022  END OF SESSION:  PT End of Session - 08/09/22 0800     Visit Number 11    Date for PT Re-Evaluation 08/30/22    PT Start Time 0800    PT Stop Time 0845    PT Time Calculation (min) 45 min    Activity Tolerance Patient tolerated treatment well    Behavior During Therapy [facility] for tasks assessed/performed               Past Medical History:  Diagnosis Date   Allergy    Arthritis    left hip    GERD (gastroesophageal reflux disease)    per patient, resolved. 03/15/20   Heart murmur    adolescent rheumatic fever - developed murmur    Hyperlipidemia    Hypertension    RAD (reactive airway disease)    with RTIs   Tubular adenoma    Past Surgical History:  Procedure Laterality Date   COLONOSCOPY     colonoscopy with polypectomy  2013   Dr Noe Gens, [place]   COLONOSCOPY WITH PROPOFOL N/A 07/01/2019   Procedure: COLONOSCOPY WITH PROPOFOL;  Surgeon: Lemar Lofty., MD;  Location: [facility];  Service: Gastroenterology;  Laterality: N/A;   COLONOSCOPY WITH PROPOFOL N/A 05/30/2020   Procedure: COLONOSCOPY WITH PROPOFOL;  Surgeon: Meridee Score Netty Starring., MD;  Location: [facility];  Service: Gastroenterology;  Laterality: N/A;   ENDARTERECTOMY Right 04/11/2020   Procedure: Right Carotid Endarterectomy;  Surgeon: Sherren Kerns, MD;  Location: [facility];  Service: Vascular;  Laterality: Right;   ENDOSCOPIC MUCOSAL RESECTION N/A 07/01/2019   Procedure: ENDOSCOPIC MUCOSAL RESECTION;  Surgeon: Meridee Score Netty Starring., MD;  Location: [facility];  Service: Gastroenterology;  Laterality: N/A;   ENDOSCOPIC MUCOSAL RESECTION N/A 05/30/2020   Procedure: ENDOSCOPIC MUCOSAL RESECTION;  Surgeon: Meridee Score Netty Starring., MD;  Location: [facility];  Service: Gastroenterology;  Laterality: N/A;   HEMOSTASIS CLIP PLACEMENT   07/01/2019   Procedure: HEMOSTASIS CLIP PLACEMENT;  Surgeon: Lemar Lofty., MD;  Location: [facility];  Service: Gastroenterology;;   POLYPECTOMY     POLYPECTOMY  07/01/2019   Procedure: POLYPECTOMY;  Surgeon: Lemar Lofty., MD;  Location: [facility];  Service: Gastroenterology;;   POLYPECTOMY  05/30/2020   Procedure: POLYPECTOMY;  Surgeon: Lemar Lofty., MD;  Location: [facility];  Service: Gastroenterology;;   Sunnie Nielsen LIFTING INJECTION  07/01/2019   Procedure: SUBMUCOSAL LIFTING INJECTION;  Surgeon: Lemar Lofty., MD;  Location: [facility];  Service: Gastroenterology;;   SUBMUCOSAL TATTOO INJECTION  07/01/2019   Procedure: SUBMUCOSAL TATTOO INJECTION;  Surgeon: Lemar Lofty., MD;  Location: [facility];  Service: Gastroenterology;;   WISDOM TOOTH EXTRACTION     Patient Active Problem List   Diagnosis Date Noted   Syncope 05/24/2022   Postnasal drip 05/23/2022   Sinus infection 03/19/2022   COVID 11/13/2021   Nuclear sclerotic cataract of both eyes 11/08/2020   Right shoulder pain 06/27/2020   S/P carotid endarterectomy, right 04/11/2020   Serrated adenoma of colon 03/12/2020   Aortic atherosclerosis (HCC) 02/03/2020   Stenosis of right carotid artery 01/26/2020   Branch retinal artery occlusion, right eye 01/19/2020   Posterior vitreous detachment of both eyes 01/19/2020   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) 08/31/2019   Tear of MCL (medial collateral ligament) of knee, left, initial encounter 08/05/2017   Left  inguinal hernia 07/04/2017   Allergic rhinitis 12/28/2015   H/O: rheumatic fever 12/28/2015   Prediabetes 12/27/2015   Rising PSA level 02/21/2014   Personal history of colonic polyps 02/17/2014   BPH with obstruction/lower urinary tract symptoms 05/04/2010   Hyperlipidemia 01/07/2009   Essential hypertension 09/26/2006    PCP: Richardean Sale  REFERRING PROVIDER: Richardean Sale  REFERRING DIAG:  M54.2 (ICD-10-CM) - Neck pain  S46.819S (ICD-10-CM) - Strain of trapezius muscle, unspecified laterality, sequela    THERAPY DIAG:  Muscle weakness (generalized)  Cervicalgia  Stiffness of left knee, not elsewhere classified  Abnormal posture  Localized edema  Rationale for Evaluation and Treatment: Rehabilitation  ONSET DATE: 05/19/22  SUBJECTIVE:                                                                                                                                                                                                         SUBJECTIVE STATEMENT: DN helped last time, got more flexibility in the neck  Hand dominance: Right  PERTINENT HISTORY:  Strain of trapezius muscle, unspecified laterality, initial encounter -Acute, initial visit - Patient continues to have muscular strains and pains from fall on 05/19/2022 - Reassuring that patient had unremarkable CT cervical spine at ER visit on 05/19/2022 - Patient is likely experiencing whiplash like muscular strains and tension resulting from fall  Closed nondisplaced comminuted fracture of left patella with routine healing, subsequent encounter -Subacute, improving, complicated, subsequent visit - X-ray obtained in clinic.  My interpretation: Interval healing of comminuted fracture of left patella - Patient has significantly improved since fall on 04/05/2022 causing comminuted left knee patellar fracture.  Fracture was treated conservatively with knee immobilization and gradual return to weightbearing as tolerated.  Patient is currently able to ambulate nearly pain-free - Continue physical therapy for stretching and strengthening of quadriceps, hamstrings which have become deconditioned due to our immobilization - May discontinue brace use and use it as needed.  May continue weightbearing as tolerated - May use rest ice compression elevation, Tylenol as needed for pain relief  PAIN:  Are  you having pain? Yes: NPRS scale: 0/10 Pain location: shoulder and neck Pain description: ache, pulling Aggravating factors: turning head  Relieving factors: Advil, Tylenol  PRECAUTIONS: None  WEIGHT BEARING RESTRICTIONS: No  FALLS:  Has patient fallen in last 6 months? Yes. Number of falls 2  LIVING ENVIRONMENT: Lives with: lives with their family and lives with their spouse Lives in: House/apartment Stairs: No Has following equipment at home: Single point cane, Environmental consultant - 2 wheeled, and [employer]  OCCUPATION: Semi-retired, run a Patent examiner business, sells insurance, and plays in a band   PLOF: Independent  PATIENT GOALS:  be able to turn my head left and right without pain, helping the knee heal, increase strength to support the knee   NEXT MD VISIT: 08/13/22  OBJECTIVE:   DIAGNOSTIC FINDINGS:  Alignment: Normal.   Skull base and vertebrae: No acute fracture. No primary bone lesion or focal pathologic process.   Soft tissues and spinal canal: No prevertebral fluid or swelling. No visible canal hematoma.   Disc levels: Mild anterior bridging osteophytosis of the lower cervical levels as well as mild disc space height loss and osteophytosis of C3-C4, otherwise intact.   Upper chest: Negative.   Other: None.   IMPRESSION: 1. No acute intracranial pathology. 2. Suspect nondisplaced fractures of the nasal bones. No displaced fractures or dislocations of the facial bones. 3. No fracture or static subluxation of the cervical spine.  COGNITION: Overall cognitive status: Within functional limits for tasks assessed  SENSATION: WFL  POSTURE: rounded shoulders  PALPATION: TTP and trigger points along bilateral upper traps    CERVICAL ROM:   Active ROM A/PROM (deg) eval  07/26/22 08/06/22  Flexion WFL WFL WFL WFL  Extension 50% w/pain [facility] WFL WFL tightness front rite side of nexk  Right lateral flexion Mod tightness with pain Limited 25%  Limited 25% Limited 25%   Left lateral flexion Very tight with pain Limited 50% Limited 25% WFL  Right rotation 50% with pain Limited 25% Limited 25% Limited 25%  Left rotation 50% with pain [facility] WFL WFL   (Blank rows = not tested)  UPPER EXTREMITY ROM: grossly WFL   UPPER EXTREMITY MMT: grossly 5/5   L KNEE ROM: knee extension 10d, knee flexion 98d  FUNCTIONAL TESTS:  5 times sit to stand: 20.88s  TODAY'S TREATMENT:                                                                                                                              DATE: 08/09/22 NuStep L 5 x 6 min S2S OHP yellow ball 2x10. Cervical retractions yellow x10 Shoulder ER green 2x10 Leg Press 40lb 2x15 2in eccentric lateral step down x10 ,x5 Seated rows & Lats 25lb 2x12 Shoulder Ext 10lb 2x10 Horiz abd 2x10 green   08/06/22 NuStep L 5 x 6 min L knee AROM 0-130 S2S OHP yellow  2x10 Lead PT M.Albright DN UT & Cervical para spinales, R SCM Shoulder ER red 2x10  Cervical retractions yellow x10   07/26/22 Bike L 3.5 x 6 min STM to UT & cervical paraspinales Seated rows & Lats 25lb 2x12 Cervical retractions red 2x10 6in box on airex step ups x 10 each Horiz Abd green 2x10 Shoulder Flex 4lb 2x10   Shoulder ER red 2x10   07/23/22 NuStep L5 x 6 min 6in step ups from airex to 6in box on airex x10 each Shoulder ext 10# 2x10 Lead  PT M.Albright DN UT & occipitals Shoulder Er red 2x10 Seated OHP yellow ball 2x10 Hamstring Curls 25lb 2x15 Leg Ext 10lb 2x10  07/18/22 UBE L3 x34mins  Pec stretch 30s  Shoulder ext 5# 2x10 Cable rows 10# 2x10  Bicep curls 25# 2x10 Tricep ext 35# 2x10 Calf stretch 30s  Calf raises 2x12  OHP yellow ball 2x10  STM to UT's with instrument assistance Passive stretching rotations, upper traps, levator 30s    07/16/22 Bike L3 x 5 min UBE L 2 x 2 min each  HS curls 25lb 2x15 Shoulder ER green 2x10 S2S OHP yellow ball 2x10 Horiz abd green 2x10 STM UT and cervical paraspinales    07/05/22 Bike  L# x 4 min STM UT and cervical paraspinales  Leg press 50lb 2x15 Rows & Lats 35lb 2x12 Cervical retractions 2x10 Chest press 15lb 21x0 Hamstring Curls 25lb 2x10 Leg Ext 10lb 2x10 Step ups LLE x10  07/03/22 NuStep L 5 x 5 min UBE L 2 x 2 min each Leg press 40lb 2x15 Rows & Lats 25lb 2x12 STM UT and cervical paraspinales  Resisted gait 30lb all directions x3  Shoulder Ext on airex 10lb 2x10   06/28/22 UBE L# x6 mins Horizontal abd green 2x10 Lats and rows 25# 2x10  Shoulder ext 10# 2x10 Standing rows 10# 2x10 STM to neck, passive stretching NuStep L5 x81mins  Leg ext 5# x10, LLE only x5  HS curls 20# x10, LLE only x5    06/26/22 NuStep L 5 x 6 min  MT STM to UT & Cervical spine  PROM to cervical spine with end range holds Leg press 30lb 3x10 Shoulder Ext 10lb standing on Airex 2x10 Standing rows 10lb 2x10  6in step ups x10 each  Shoulder ER red 2x10 S2S OHP yellow ball x10   06/21/22- EVAL   PATIENT EDUCATION:  Education details: HEP and POC Person educated: Patient Education method: Explanation Education comprehension: verbalized understanding  HOME EXERCISE PROGRAM: Access Code: [email or web address] URL: [email or web address] Date: 06/21/2022 Prepared by: Cassie Freer  Exercises - Seated Upper Trapezius Stretch  - 2 x daily - 7 x weekly - 30 hold - Seated Levator Scapulae Stretch  - 2 x daily - 7 x weekly - 30 hold - Doorway Pec Stretch at 90 Degrees Abduction  - 2 x daily - 7 x weekly - 30 hold -Self massage with Tennis Ball for trigger point release   ASSESSMENT:  CLINICAL IMPRESSION: Patient enters doing well overall.  Some L knee discomfort with S2S OHP. Decrease end rang extension control present with L knee doing leg press. Pain present with eccentric lateral step downs. Some UE fatigue reported with horiz abduction.   OBJECTIVE IMPAIRMENTS: decreased ROM, increased fascial restrictions, impaired flexibility, and pain.   REHAB POTENTIAL:  Good  CLINICAL DECISION MAKING: Stable/uncomplicated  EVALUATION COMPLEXITY: Low   GOALS: Goals reviewed with patient? Yes  SHORT TERM GOALS: Target date: 07/26/22  Patient will be independent with initial HEP.  Goal status: Met 07/05/22  2.   Patient will demonstrate improved functional LE strength as demonstrated by 5xSTS < 12s without UE support. Baseline: 20.88s with UE push off and pain Goal status: 9 sec Met 08/06/22   LONG TERM GOALS: Target date: 08/30/22  Patient will be independent with advanced/ongoing HEP to improve outcomes and carryover.  Goal status: Met 08/06/22  2.  Patient will report 75% improvement in neck pain to improve QOL.  Baseline: 4-8/10 with movement Goal status: Progressing 60% 08/06/22  3.  Patient will demonstrate full pain free cervical ROM for safety with driving.  Baseline: see chart above Goal status: Progressing 08/06/22  4. Patient will demonstrate improved L knee AROM to >/= 0-120 deg to allow for normal gait and stair mechanics. Baseline: 10-0-98 Goal status: Met 08/06/22 PLAN:  PT FREQUENCY: 2x/week  PT DURATION: 10 weeks  PLANNED INTERVENTIONS: Therapeutic exercises, Therapeutic activity, Neuromuscular re-education, Balance training, Gait training, Patient/Family education, Self Care, Joint mobilization, Dry Needling, Cryotherapy, Moist heat, Vasopneumatic device, Traction, and Manual therapy  PLAN FOR NEXT SESSION: stretching neck, work on knee strengthening and stretching too   Grayce Sessions, PTA 08/09/2022, 8:02 AM

## 2022-08-10 NOTE — Progress Notes (Unsigned)
[facility] [address] [place] [address] Phone: [phone]   Assessment and Plan:     There are no diagnoses linked to this encounter.  ***   Pertinent previous records reviewed include ***   Follow Up: ***     Subjective:   I, Charles Tate, am serving as a Neurosurgeon for Doctor Richardean Sale   Chief Complaint: left knee pain    HPI:    04/05/22 Patient is a 72 year old male complaining of left knee pain. Patient states he fell today he has pain with extension and he has pain when walking he notes some swelling at the top , no numbness or tingling, no radiating pain when he flexes his foot he has some tightness in his knee,he states his knee has been "popping left or right' but no pain he states he has an "unstable knee"   Is noting some lightheadness and dizziness but states he is taking new blood pressure meds and is on predisone dos pak    04/12/2022 Patient states that he has swelling not as much , but pain has gotten better, knee brace immobilizer    05/03/2022 Patient states he still has some swelling, no pain just tender and tight    05/19/2022 ED Patient presents with his son who assists with the history. Patient has had multiple episodes of prodromal syncope, has been seen by his physician, has had adjustments of his antihypertensives. His most recent episode was last month, resulting in a fall in which he broke his left knee. Today he notes he did not have a typical amount of food intake, had an episode of lightheadedness while upright, fell, no chest pain before or after the event. Since the event has had pain in his head, neck, soreness in his jaw. No confusion, disorientation, no broken teeth. Son notes the patient is interacting in a typical manner currently.     05/22/2022 Patient states knee feels great, had a syncope episode was standing for a longtime , broken nose, decreased ROM neck , thinks the  falling could be a result in changes in medicine     06/12/2022 Patient states that he is doing pretty good    08/13/2022 Patient states   Relevant Historical Information:   Hypertension, history of left MCL injury in 2019  Additional pertinent review of systems negative.   Current Outpatient Medications:    aspirin EC 81 MG tablet, Take 81 mg by mouth daily. Swallow whole., Disp: , Rfl:    atorvastatin (LIPITOR) 40 MG tablet, TAKE 1 TABLET BY MOUTH EVERY DAY, Disp: 90 tablet, Rfl: 3   azelastine (ASTELIN) 0.1 % nasal spray, Place 1 spray into both nostrils 2 (two) times daily. Use in each nostril as directed, Disp: 30 mL, Rfl: 12   Colostrum 500 MG CAPS, Take 500 mg by mouth daily., Disp: , Rfl:    fluticasone (FLONASE) 50 MCG/ACT nasal spray, Place 2 sprays into both nostrils daily., Disp: 16 mL, Rfl: 6   Misc Natural Products (SUPER GREENS PO), Take 1 Scoop by mouth daily. , Disp: , Rfl:    telmisartan (MICARDIS) 80 MG tablet, Take 1 tablet (80 mg total) by mouth daily., Disp: 90 tablet, Rfl: 3   Objective:     There were no vitals filed for this visit.    There is no height or weight on file to calculate BMI.    Physical Exam:    ***  Electronically signed by:  Charles Tate [facility] 12:45 PM 08/10/22

## 2022-08-13 ENCOUNTER — Ambulatory Visit: Payer: Medicare HMO | Admitting: Sports Medicine

## 2022-08-13 VITALS — BP 124/78 | HR 57 | Ht 74.0 in | Wt 197.0 lb

## 2022-08-13 DIAGNOSIS — M25562 Pain in left knee: Secondary | ICD-10-CM | POA: Diagnosis not present

## 2022-08-13 DIAGNOSIS — M542 Cervicalgia: Secondary | ICD-10-CM | POA: Diagnosis not present

## 2022-08-13 DIAGNOSIS — S46819D Strain of other muscles, fascia and tendons at shoulder and upper arm level, unspecified arm, subsequent encounter: Secondary | ICD-10-CM | POA: Diagnosis not present

## 2022-08-13 DIAGNOSIS — G8929 Other chronic pain: Secondary | ICD-10-CM | POA: Diagnosis not present

## 2022-08-13 DIAGNOSIS — S82045D Nondisplaced comminuted fracture of left patella, subsequent encounter for closed fracture with routine healing: Secondary | ICD-10-CM | POA: Diagnosis not present

## 2022-08-14 ENCOUNTER — Ambulatory Visit: Payer: Medicare HMO | Admitting: Physical Therapy

## 2022-08-14 ENCOUNTER — Encounter: Payer: Self-pay | Admitting: Physical Therapy

## 2022-08-14 DIAGNOSIS — R293 Abnormal posture: Secondary | ICD-10-CM

## 2022-08-14 DIAGNOSIS — R600 Localized edema: Secondary | ICD-10-CM | POA: Diagnosis not present

## 2022-08-14 DIAGNOSIS — M6281 Muscle weakness (generalized): Secondary | ICD-10-CM | POA: Diagnosis not present

## 2022-08-14 DIAGNOSIS — M542 Cervicalgia: Secondary | ICD-10-CM | POA: Diagnosis not present

## 2022-08-14 DIAGNOSIS — M25662 Stiffness of left knee, not elsewhere classified: Secondary | ICD-10-CM | POA: Diagnosis not present

## 2022-08-14 NOTE — Therapy (Signed)
OUTPATIENT PHYSICAL THERAPY CERVICAL TREATMENT     Patient Name: Charles Tate MRN: [id number] DOB:May 26, 1950, 72 y.o., male Today's Date: 08/14/2022  END OF SESSION:  PT End of Session - 08/14/22 0931     Visit Number 12    Date for PT Re-Evaluation 08/30/22    PT Start Time 0931    PT Stop Time 1015    PT Time Calculation (min) 44 min    Activity Tolerance Patient tolerated treatment well    Behavior During Therapy [facility] for tasks assessed/performed               Past Medical History:  Diagnosis Date   Allergy    Arthritis    left hip    GERD (gastroesophageal reflux disease)    per patient, resolved. 03/15/20   Heart murmur    adolescent rheumatic fever - developed murmur    Hyperlipidemia    Hypertension    RAD (reactive airway disease)    with RTIs   Tubular adenoma    Past Surgical History:  Procedure Laterality Date   COLONOSCOPY     colonoscopy with polypectomy  2013   Dr Noe Gens, [place]   COLONOSCOPY WITH PROPOFOL N/A 07/01/2019   Procedure: COLONOSCOPY WITH PROPOFOL;  Surgeon: Lemar Lofty., MD;  Location: [facility];  Service: Gastroenterology;  Laterality: N/A;   COLONOSCOPY WITH PROPOFOL N/A 05/30/2020   Procedure: COLONOSCOPY WITH PROPOFOL;  Surgeon: Meridee Score Netty Starring., MD;  Location: [facility];  Service: Gastroenterology;  Laterality: N/A;   ENDARTERECTOMY Right 04/11/2020   Procedure: Right Carotid Endarterectomy;  Surgeon: Sherren Kerns, MD;  Location: [facility] OR;  Service: Vascular;  Laterality: Right;   ENDOSCOPIC MUCOSAL RESECTION N/A 07/01/2019   Procedure: ENDOSCOPIC MUCOSAL RESECTION;  Surgeon: Meridee Score Netty Starring., MD;  Location: [facility];  Service: Gastroenterology;  Laterality: N/A;   ENDOSCOPIC MUCOSAL RESECTION N/A 05/30/2020   Procedure: ENDOSCOPIC MUCOSAL RESECTION;  Surgeon: Meridee Score Netty Starring., MD;  Location: [facility];  Service: Gastroenterology;  Laterality: N/A;   HEMOSTASIS CLIP PLACEMENT   07/01/2019   Procedure: HEMOSTASIS CLIP PLACEMENT;  Surgeon: Lemar Lofty., MD;  Location: [facility];  Service: Gastroenterology;;   POLYPECTOMY     POLYPECTOMY  07/01/2019   Procedure: POLYPECTOMY;  Surgeon: Lemar Lofty., MD;  Location: [facility];  Service: Gastroenterology;;   POLYPECTOMY  05/30/2020   Procedure: POLYPECTOMY;  Surgeon: Lemar Lofty., MD;  Location: [facility];  Service: Gastroenterology;;   Sunnie Nielsen LIFTING INJECTION  07/01/2019   Procedure: SUBMUCOSAL LIFTING INJECTION;  Surgeon: Lemar Lofty., MD;  Location: [facility];  Service: Gastroenterology;;   SUBMUCOSAL TATTOO INJECTION  07/01/2019   Procedure: SUBMUCOSAL TATTOO INJECTION;  Surgeon: Lemar Lofty., MD;  Location: [facility];  Service: Gastroenterology;;   WISDOM TOOTH EXTRACTION     Patient Active Problem List   Diagnosis Date Noted   Syncope 05/24/2022   Postnasal drip 05/23/2022   Sinus infection 03/19/2022   COVID 11/13/2021   Nuclear sclerotic cataract of both eyes 11/08/2020   Right shoulder pain 06/27/2020   S/P carotid endarterectomy, right 04/11/2020   Serrated adenoma of colon 03/12/2020   Aortic atherosclerosis (HCC) 02/03/2020   Stenosis of right carotid artery 01/26/2020   Branch retinal artery occlusion, right eye 01/19/2020   Posterior vitreous detachment of both eyes 01/19/2020   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) 08/31/2019   Tear of MCL (medial collateral ligament) of knee, left, initial encounter 08/05/2017   Left  inguinal hernia 07/04/2017   Allergic rhinitis 12/28/2015   H/O: rheumatic fever 12/28/2015   Prediabetes 12/27/2015   Rising PSA level 02/21/2014   Personal history of colonic polyps 02/17/2014   BPH with obstruction/lower urinary tract symptoms 05/04/2010   Hyperlipidemia 01/07/2009   Essential hypertension 09/26/2006    PCP: Richardean Sale  REFERRING PROVIDER: Richardean Sale  REFERRING DIAG:  M54.2 (ICD-10-CM) - Neck pain  S46.819S (ICD-10-CM) - Strain of trapezius muscle, unspecified laterality, sequela    THERAPY DIAG:  Muscle weakness (generalized)  Cervicalgia  Stiffness of left knee, not elsewhere classified  Abnormal posture  Rationale for Evaluation and Treatment: Rehabilitation  ONSET DATE: 05/19/22  SUBJECTIVE:                                                                                                                                                                                                         SUBJECTIVE STATEMENT: Neck and shoulder are still sore  Hand dominance: Right  PERTINENT HISTORY:  Strain of trapezius muscle, unspecified laterality, initial encounter -Acute, initial visit - Patient continues to have muscular strains and pains from fall on 05/19/2022 - Reassuring that patient had unremarkable CT cervical spine at ER visit on 05/19/2022 - Patient is likely experiencing whiplash like muscular strains and tension resulting from fall  Closed nondisplaced comminuted fracture of left patella with routine healing, subsequent encounter -Subacute, improving, complicated, subsequent visit - X-ray obtained in clinic.  My interpretation: Interval healing of comminuted fracture of left patella - Patient has significantly improved since fall on 04/05/2022 causing comminuted left knee patellar fracture.  Fracture was treated conservatively with knee immobilization and gradual return to weightbearing as tolerated.  Patient is currently able to ambulate nearly pain-free - Continue physical therapy for stretching and strengthening of quadriceps, hamstrings which have become deconditioned due to our immobilization - May discontinue brace use and use it as needed.  May continue weightbearing as tolerated - May use rest ice compression elevation, Tylenol as needed for pain relief  PAIN:  Are you having pain? Yes: NPRS scale:  0/10 Pain location: shoulder and neck Pain description: ache, pulling Aggravating factors: turning head  Relieving factors: Advil, Tylenol  PRECAUTIONS: None  WEIGHT BEARING RESTRICTIONS: No  FALLS:  Has patient fallen in last 6 months? Yes. Number of falls 2  LIVING ENVIRONMENT: Lives with: lives with their family and lives with their spouse Lives in: House/apartment Stairs: No Has following equipment at home: Single point cane, Environmental consultant - 2 wheeled, and Crutches  OCCUPATION: Semi-retired, run a Patent examiner business,  sells insurance, and plays in a band   PLOF: Independent  PATIENT GOALS:  be able to turn my head left and right without pain, helping the knee heal, increase strength to support the knee   NEXT MD VISIT: 08/13/22  OBJECTIVE:   DIAGNOSTIC FINDINGS:  Alignment: Normal.   Skull base and vertebrae: No acute fracture. No primary bone lesion or focal pathologic process.   Soft tissues and spinal canal: No prevertebral fluid or swelling. No visible canal hematoma.   Disc levels: Mild anterior bridging osteophytosis of the lower cervical levels as well as mild disc space height loss and osteophytosis of C3-C4, otherwise intact.   Upper chest: Negative.   Other: None.   IMPRESSION: 1. No acute intracranial pathology. 2. Suspect nondisplaced fractures of the nasal bones. No displaced fractures or dislocations of the facial bones. 3. No fracture or static subluxation of the cervical spine.  COGNITION: Overall cognitive status: Within functional limits for tasks assessed  SENSATION: WFL  POSTURE: rounded shoulders  PALPATION: TTP and trigger points along bilateral upper traps    CERVICAL ROM:   Active ROM A/PROM (deg) eval  07/26/22 08/06/22  Flexion WFL WFL WFL WFL  Extension 50% w/pain [facility] WFL WFL tightness front rite side of nexk  Right lateral flexion Mod tightness with pain Limited 25%  Limited 25% Limited 25%  Left lateral flexion Very tight  with pain Limited 50% Limited 25% WFL  Right rotation 50% with pain Limited 25% Limited 25% Limited 25%  Left rotation 50% with pain [facility] WFL WFL   (Blank rows = not tested)  UPPER EXTREMITY ROM: grossly WFL   UPPER EXTREMITY MMT: grossly 5/5   L KNEE ROM: knee extension 10d, knee flexion 98d  FUNCTIONAL TESTS:  5 times sit to stand: 20.88s  TODAY'S TREATMENT:                                                                                                                              DATE: 08/14/22 Bike L3 x 6 min Leg Press 40lb 2x15 Seated Rows & Lats 25lb 2x12  Lead PT M.Albright DN UT & Cervical para spinales, R SCM ER green 2x10  Cervical retraction red 2x10 HS curls 35lb 2x12 Leg Ext 10lb 2x10  08/09/22 NuStep L 5 x 6 min S2S OHP yellow ball 2x10. Cervical retractions yellow x10 Shoulder ER green 2x10 Leg Press 40lb 2x15 2in eccentric lateral step down x10 ,x5 Seated rows & Lats 25lb 2x12 Shoulder Ext 10lb 2x10 Horiz abd 2x10 green   08/06/22 NuStep L 5 x 6 min L knee AROM 0-130 S2S OHP yellow  2x10 Lead PT M.Albright DN UT & Cervical para spinales, R SCM Shoulder ER red 2x10  Cervical retractions yellow x10   07/26/22 Bike L 3.5 x 6 min STM to UT & cervical paraspinales Seated rows & Lats 25lb 2x12 Cervical retractions red 2x10 6in box on airex step ups x 10 each Horiz  Abd green 2x10 Shoulder Flex 4lb 2x10   Shoulder ER red 2x10   07/23/22 NuStep L5 x 6 min 6in step ups from airex to 6in box on airex x10 each Shoulder ext 10# 2x10 Lead PT M.Albright DN UT & occipitals Shoulder Er red 2x10 Seated OHP yellow ball 2x10 Hamstring Curls 25lb 2x15 Leg Ext 10lb 2x10  07/18/22 UBE L3 x65mins  Pec stretch 30s  Shoulder ext 5# 2x10 Cable rows 10# 2x10  Bicep curls 25# 2x10 Tricep ext 35# 2x10 Calf stretch 30s  Calf raises 2x12  OHP yellow ball 2x10  STM to UT's with instrument assistance Passive stretching rotations, upper traps, levator 30s     07/16/22 Bike L3 x 5 min UBE L 2 x 2 min each  HS curls 25lb 2x15 Shoulder ER green 2x10 S2S OHP yellow ball 2x10 Horiz abd green 2x10 STM UT and cervical paraspinales    07/05/22 Bike L# x 4 min STM UT and cervical paraspinales  Leg press 50lb 2x15 Rows & Lats 35lb 2x12 Cervical retractions 2x10 Chest press 15lb 21x0 Hamstring Curls 25lb 2x10 Leg Ext 10lb 2x10 Step ups LLE x10  07/03/22 NuStep L 5 x 5 min UBE L 2 x 2 min each Leg press 40lb 2x15 Rows & Lats 25lb 2x12 STM UT and cervical paraspinales  Resisted gait 30lb all directions x3  Shoulder Ext on airex 10lb 2x10   06/28/22 UBE L# x6 mins Horizontal abd green 2x10 Lats and rows 25# 2x10  Shoulder ext 10# 2x10 Standing rows 10# 2x10 STM to neck, passive stretching NuStep L5 x17mins  Leg ext 5# x10, LLE only x5  HS curls 20# x10, LLE only x5    06/26/22 NuStep L 5 x 6 min  MT STM to UT & Cervical spine  PROM to cervical spine with end range holds Leg press 30lb 3x10 Shoulder Ext 10lb standing on Airex 2x10 Standing rows 10lb 2x10  6in step ups x10 each  Shoulder ER red 2x10 S2S OHP yellow ball x10   06/21/22- EVAL   PATIENT EDUCATION:  Education details: HEP and POC Person educated: Patient Education method: Explanation Education comprehension: verbalized understanding  HOME EXERCISE PROGRAM: Access Code: [email or web address] URL: [email or web address] Date: 06/21/2022 Prepared by: Cassie Freer  Exercises - Seated Upper Trapezius Stretch  - 2 x daily - 7 x weekly - 30 hold - Seated Levator Scapulae Stretch  - 2 x daily - 7 x weekly - 30 hold - Doorway Pec Stretch at 90 Degrees Abduction  - 2 x daily - 7 x weekly - 30 hold -Self massage with Tennis Ball for trigger point release   ASSESSMENT:  CLINICAL IMPRESSION: Patient enters doing well overall. Session consisted of postural and LE strengthening. Tactile cues to elbows needed with external rotation. Lead PT assited in session  providing Pt with DN. All interventions completed well.  OBJECTIVE IMPAIRMENTS: decreased ROM, increased fascial restrictions, impaired flexibility, and pain.   REHAB POTENTIAL: Good  CLINICAL DECISION MAKING: Stable/uncomplicated  EVALUATION COMPLEXITY: Low   GOALS: Goals reviewed with patient? Yes  SHORT TERM GOALS: Target date: 07/26/22  Patient will be independent with initial HEP.  Goal status: Met 07/05/22  2.   Patient will demonstrate improved functional LE strength as demonstrated by 5xSTS < 12s without UE support. Baseline: 20.88s with UE push off and pain Goal status: 9 sec Met 08/06/22   LONG TERM GOALS: Target date: 08/30/22  Patient will be independent with advanced/ongoing HEP to improve outcomes and carryover.  Goal status: Met 08/06/22  2.  Patient will report 75% improvement in neck pain to improve QOL.  Baseline: 4-8/10 with movement Goal status: Progressing 60% 08/06/22  3.  Patient will demonstrate full pain free cervical ROM for safety with driving.  Baseline: see chart above Goal status: Progressing 08/06/22  4. Patient will demonstrate improved L knee AROM to >/= 0-120 deg to allow for normal gait and stair mechanics. Baseline: 10-0-98 Goal status: Met 08/06/22 PLAN:  PT FREQUENCY: 2x/week  PT DURATION: 10 weeks  PLANNED INTERVENTIONS: Therapeutic exercises, Therapeutic activity, Neuromuscular re-education, Balance training, Gait training, Patient/Family education, Self Care, Joint mobilization, Dry Needling, Cryotherapy, Moist heat, Vasopneumatic device, Traction, and Manual therapy  PLAN FOR NEXT SESSION: stretching neck, work on knee strengthening and stretching too   Grayce Sessions, PTA 08/14/2022, 9:33 AM

## 2022-08-16 ENCOUNTER — Ambulatory Visit: Payer: Medicare HMO | Admitting: Physical Therapy

## 2022-09-28 ENCOUNTER — Other Ambulatory Visit: Payer: Self-pay

## 2022-09-28 DIAGNOSIS — E782 Mixed hyperlipidemia: Secondary | ICD-10-CM

## 2022-09-28 DIAGNOSIS — R7303 Prediabetes: Secondary | ICD-10-CM

## 2022-09-28 DIAGNOSIS — I10 Essential (primary) hypertension: Secondary | ICD-10-CM

## 2022-09-28 DIAGNOSIS — I700 Atherosclerosis of aorta: Secondary | ICD-10-CM

## 2022-10-02 ENCOUNTER — Ambulatory Visit (INDEPENDENT_AMBULATORY_CARE_PROVIDER_SITE_OTHER): Payer: Medicare HMO

## 2022-10-02 VITALS — Ht 74.0 in | Wt 192.0 lb

## 2022-10-02 DIAGNOSIS — Z0000 Encounter for general adult medical examination without abnormal findings: Secondary | ICD-10-CM | POA: Diagnosis not present

## 2022-10-02 NOTE — Patient Instructions (Signed)
Charles Tate , Thank you for taking time to come for your Medicare Wellness Visit. I appreciate your ongoing commitment to your health goals. Please review the following plan we discussed and let me know if I can assist you in the future.   Referrals/Orders/Follow-Ups/Clinician Recommendations: Remember to call and schedule your annual eye exam with Dr. Carlynn Purl.  Also remember to get your Tdap vaccine during your up coming office visit with Dr. Lawerance Bach.  Keep up the good work.  This is a list of the screening recommended for you and due dates:  Health Maintenance  Topic Date Due   Zoster (Shingles) Vaccine (1 of 2) Never done   DTaP/Tdap/Td vaccine (2 - Tdap) 05/03/2020   Flu Shot  10/04/2022   Medicare Annual Wellness Visit  10/02/2023   Colon Cancer Screening  05/30/2025   Pneumonia Vaccine  Completed   Hepatitis C Screening  Completed   HPV Vaccine  Aged Out   COVID-19 Vaccine  Discontinued    Advanced directives: (Copy Requested) Please bring a copy of your health care power of attorney and living will to the office to be added to your chart at your convenience.  Next Medicare Annual Wellness Visit scheduled for next year: No  Preventive Care 61 Years and Older, Male  Preventive care refers to lifestyle choices and visits with your health care provider that can promote health and wellness. What does preventive care include? A yearly physical exam. This is also called an annual well check. Dental exams once or twice a year. Routine eye exams. Ask your health care provider how often you should have your eyes checked. Personal lifestyle choices, including: Daily care of your teeth and gums. Regular physical activity. Eating a healthy diet. Avoiding tobacco and drug use. Limiting alcohol use. Practicing safe sex. Taking low doses of aspirin every day. Taking vitamin and mineral supplements as recommended by your health care provider. What happens during an annual well check? The  services and screenings done by your health care provider during your annual well check will depend on your age, overall health, lifestyle risk factors, and family history of disease. Counseling  Your health care provider may ask you questions about your: Alcohol use. Tobacco use. Drug use. Emotional well-being. Home and relationship well-being. Sexual activity. Eating habits. History of falls. Memory and ability to understand (cognition). Work and work Astronomer. Screening  You may have the following tests or measurements: Height, weight, and BMI. Blood pressure. Lipid and cholesterol levels. These may be checked every 5 years, or more frequently if you are over 86 years old. Skin check. Lung cancer screening. You may have this screening every year starting at age 57 if you have a 30-pack-year history of smoking and currently smoke or have quit within the past 15 years. Fecal occult blood test (FOBT) of the stool. You may have this test every year starting at age 68. Flexible sigmoidoscopy or colonoscopy. You may have a sigmoidoscopy every 5 years or a colonoscopy every 10 years starting at age 4. Prostate cancer screening. Recommendations will vary depending on your family history and other risks. Hepatitis C blood test. Hepatitis B blood test. Sexually transmitted disease (STD) testing. Diabetes screening. This is done by checking your blood sugar (glucose) after you have not eaten for a while (fasting). You may have this done every 1-3 years. Abdominal aortic aneurysm (AAA) screening. You may need this if you are a current or former smoker. Osteoporosis. You may be screened starting at age  70 if you are at high risk. Talk with your health care provider about your test results, treatment options, and if necessary, the need for more tests. Vaccines  Your health care provider may recommend certain vaccines, such as: Influenza vaccine. This is recommended every year. Tetanus,  diphtheria, and acellular pertussis (Tdap, Td) vaccine. You may need a Td booster every 10 years. Zoster vaccine. You may need this after age 79. Pneumococcal 13-valent conjugate (PCV13) vaccine. One dose is recommended after age 31. Pneumococcal polysaccharide (PPSV23) vaccine. One dose is recommended after age 65. Talk to your health care provider about which screenings and vaccines you need and how often you need them. This information is not intended to replace advice given to you by your health care provider. Make sure you discuss any questions you have with your health care provider. Document Released: 03/18/2015 Document Revised: 11/09/2015 Document Reviewed: 12/21/2014 Elsevier Interactive Patient Education  2017 [employer].  Fall Prevention in the Home Falls can cause injuries. They can happen to people of all ages. There are many things you can do to make your home safe and to help prevent falls. What can I do on the outside of my home? Regularly fix the edges of walkways and driveways and fix any cracks. Remove anything that might make you trip as you walk through a door, such as a raised step or threshold. Trim any bushes or trees on the path to your home. Use bright outdoor lighting. Clear any walking paths of anything that might make someone trip, such as rocks or tools. Regularly check to see if handrails are loose or broken. Make sure that both sides of any steps have handrails. Any raised decks and porches should have guardrails on the edges. Have any leaves, snow, or ice cleared regularly. Use sand or salt on walking paths during winter. Clean up any spills in your garage right away. This includes oil or grease spills. What can I do in the bathroom? Use night lights. Install grab bars by the toilet and in the tub and shower. Do not use towel bars as grab bars. Use non-skid mats or decals in the tub or shower. If you need to sit down in the shower, use a plastic,  non-slip stool. Keep the floor dry. Clean up any water that spills on the floor as soon as it happens. Remove soap buildup in the tub or shower regularly. Attach bath mats securely with double-sided non-slip rug tape. Do not have throw rugs and other things on the floor that can make you trip. What can I do in the bedroom? Use night lights. Make sure that you have a light by your bed that is easy to reach. Do not use any sheets or blankets that are too big for your bed. They should not hang down onto the floor. Have a firm chair that has side arms. You can use this for support while you get dressed. Do not have throw rugs and other things on the floor that can make you trip. What can I do in the kitchen? Clean up any spills right away. Avoid walking on wet floors. Keep items that you use a lot in easy-to-reach places. If you need to reach something above you, use a strong step stool that has a grab bar. Keep electrical cords out of the way. Do not use floor polish or wax that makes floors slippery. If you must use wax, use non-skid floor wax. Do not have throw rugs and other  things on the floor that can make you trip. What can I do with my stairs? Do not leave any items on the stairs. Make sure that there are handrails on both sides of the stairs and use them. Fix handrails that are broken or loose. Make sure that handrails are as long as the stairways. Check any carpeting to make sure that it is firmly attached to the stairs. Fix any carpet that is loose or worn. Avoid having throw rugs at the top or bottom of the stairs. If you do have throw rugs, attach them to the floor with carpet tape. Make sure that you have a light switch at the top of the stairs and the bottom of the stairs. If you do not have them, ask someone to add them for you. What else can I do to help prevent falls? Wear shoes that: Do not have high heels. Have rubber bottoms. Are comfortable and fit you well. Are closed  at the toe. Do not wear sandals. If you use a stepladder: Make sure that it is fully opened. Do not climb a closed stepladder. Make sure that both sides of the stepladder are locked into place. Ask someone to hold it for you, if possible. Clearly mark and make sure that you can see: Any grab bars or handrails. First and last steps. Where the edge of each step is. Use tools that help you move around (mobility aids) if they are needed. These include: Canes. Walkers. Scooters. Crutches. Turn on the lights when you go into a dark area. Replace any light bulbs as soon as they burn out. Set up your furniture so you have a clear path. Avoid moving your furniture around. If any of your floors are uneven, fix them. If there are any pets around you, be aware of where they are. Review your medicines with your doctor. Some medicines can make you feel dizzy. This can increase your chance of falling. Ask your doctor what other things that you can do to help prevent falls. This information is not intended to replace advice given to you by your health care provider. Make sure you discuss any questions you have with your health care provider. Document Released: 12/16/2008 Document Revised: 07/28/2015 Document Reviewed: 03/26/2014 Elsevier Interactive Patient Education  2017 [employer].

## 2022-10-02 NOTE — Progress Notes (Signed)
Subjective:   Charles Tate is a 72 y.o. male who presents for Medicare Annual/Subsequent preventive examination.  Visit Complete: Virtual  I connected with  Charles Tate on 10/02/22 by a audio enabled telemedicine application and verified that I am speaking with the correct person using two identifiers.  Patient Location: Home  Provider Location: Home Office  I discussed the limitations of evaluation and management by telemedicine. The patient expressed understanding and agreed to proceed.  Patient Medicare AWV questionnaire was completed by the patient on 10/02/2022; I have confirmed that all information answered by patient is correct and no changes since this date.  Vital Signs: Per patient no change in vitals since last visit.   Review of Systems    Cardiac Risk Factors include: advanced age (>44men, >67 women);male gender;hypertension;dyslipidemia     Objective:    Today's Vitals   10/02/22 0901  Weight: 192 lb (87.1 kg)  Height: 6\' 2"  (1.88 m)   Body mass index is 24.65 kg/m.     10/02/2022    9:09 AM 06/21/2022   11:04 AM 05/23/2022   11:02 AM 05/19/2022    2:06 PM 09/07/2021    8:48 AM 05/30/2020    6:38 AM 04/20/2020   10:33 PM  Advanced Directives  Does Patient Have a Medical Advance Directive? Yes No No No No No No  Type of Estate agent of [place];Living will        Copy of Healthcare Power of Attorney in Chart? No - copy requested        Would patient like information on creating a medical advance directive?     No - Patient declined  No - Patient declined    Current Medications (verified) Outpatient Encounter Medications as of 10/02/2022  Medication Sig   aspirin EC 81 MG tablet Take 81 mg by mouth daily. Swallow whole.   atorvastatin (LIPITOR) 40 MG tablet TAKE 1 TABLET BY MOUTH EVERY DAY   azelastine (ASTELIN) 0.1 % nasal spray Place 1 spray into both nostrils 2 (two) times daily. Use in each nostril as directed   Colostrum  500 MG CAPS Take 500 mg by mouth daily.   fluticasone (FLONASE) 50 MCG/ACT nasal spray Place 2 sprays into both nostrils daily.   Misc Natural Products (SUPER GREENS PO) Take 1 Scoop by mouth daily.    telmisartan (MICARDIS) 80 MG tablet Take 1 tablet (80 mg total) by mouth daily.   No facility-administered encounter medications on file as of 10/02/2022.    Allergies (verified) Ciprofloxacin hcl, Cefdinir, Tamsulosin, and Lisinopril   History: Past Medical History:  Diagnosis Date   Allergy    Arthritis    left hip    GERD (gastroesophageal reflux disease)    per patient, resolved. 03/15/20   Heart murmur    adolescent rheumatic fever - developed murmur    Hyperlipidemia    Hypertension    RAD (reactive airway disease)    with RTIs   Tubular adenoma    Past Surgical History:  Procedure Laterality Date   COLONOSCOPY     colonoscopy with polypectomy  2013   Dr Noe Gens, [place]   COLONOSCOPY WITH PROPOFOL N/A 07/01/2019   Procedure: COLONOSCOPY WITH PROPOFOL;  Surgeon: Lemar Lofty., MD;  Location: [facility];  Service: Gastroenterology;  Laterality: N/A;   COLONOSCOPY WITH PROPOFOL N/A 05/30/2020   Procedure: COLONOSCOPY WITH PROPOFOL;  Surgeon: Meridee Score Netty Starring., MD;  Location: [facility];  Service: Gastroenterology;  Laterality: N/A;  ENDARTERECTOMY Right 04/11/2020   Procedure: Right Carotid Endarterectomy;  Surgeon: Sherren Kerns, MD;  Location: [facility] OR;  Service: Vascular;  Laterality: Right;   ENDOSCOPIC MUCOSAL RESECTION N/A 07/01/2019   Procedure: ENDOSCOPIC MUCOSAL RESECTION;  Surgeon: Meridee Score Netty Starring., MD;  Location: [facility];  Service: Gastroenterology;  Laterality: N/A;   ENDOSCOPIC MUCOSAL RESECTION N/A 05/30/2020   Procedure: ENDOSCOPIC MUCOSAL RESECTION;  Surgeon: Meridee Score Netty Starring., MD;  Location: [facility];  Service: Gastroenterology;  Laterality: N/A;   HEMOSTASIS CLIP PLACEMENT  07/01/2019   Procedure: HEMOSTASIS CLIP  PLACEMENT;  Surgeon: Lemar Lofty., MD;  Location: [facility];  Service: Gastroenterology;;   POLYPECTOMY     POLYPECTOMY  07/01/2019   Procedure: POLYPECTOMY;  Surgeon: Lemar Lofty., MD;  Location: [facility];  Service: Gastroenterology;;   POLYPECTOMY  05/30/2020   Procedure: POLYPECTOMY;  Surgeon: Lemar Lofty., MD;  Location: [facility];  Service: Gastroenterology;;   SUBMUCOSAL LIFTING INJECTION  07/01/2019   Procedure: SUBMUCOSAL LIFTING INJECTION;  Surgeon: Lemar Lofty., MD;  Location: [facility];  Service: Gastroenterology;;   SUBMUCOSAL TATTOO INJECTION  07/01/2019   Procedure: SUBMUCOSAL TATTOO INJECTION;  Surgeon: Lemar Lofty., MD;  Location: [facility];  Service: Gastroenterology;;   WISDOM TOOTH EXTRACTION     Family History  Problem Relation Age of Onset   Arthritis Mother    Hyperlipidemia Mother    Lupus Mother    Heart attack Father 62   Hypertension Sister    Heart attack Paternal Grandmother 75   Diabetes Paternal Aunt    Stroke Maternal Grandfather 93   Cancer Paternal Grandfather        ? stomach; in 2s   Colon cancer Paternal Grandfather    Colon polyps Neg Hx    Esophageal cancer Neg Hx    Rectal cancer Neg Hx    Stomach cancer Neg Hx    Inflammatory bowel disease Neg Hx    Liver disease Neg Hx    Pancreatic cancer Neg Hx    Social History   Socioeconomic History   Marital status: Married    Spouse name: Elease Hashimoto   Number of children: 2   Years of education: Not on file   Highest education level: Not on file  Occupational History   Occupation: self employed   Tobacco Use   Smoking status: Former   Smokeless tobacco: Never   Tobacco comments:    intermittent, short term smoker as a teen. Some second hand smoke in 66s & 30s  Vaping Use   Vaping status: Never Used  Substance and Sexual Activity   Alcohol use: Not Currently   Drug use: No   Sexual activity: Not on file  Other Topics  Concern   Not on file  Social History Narrative   Exercise: none regularly   Social Determinants of Health   Financial Resource Strain: Low Risk  (10/02/2022)   Overall Financial Resource Strain (CARDIA)    Difficulty of Paying Living Expenses: Not hard at all  Food Insecurity: No Food Insecurity (10/02/2022)   Hunger Vital Sign    Worried About Running Out of Food in the Last Year: Never true    Ran Out of Food in the Last Year: Never true  Transportation Needs: No Transportation Needs (10/02/2022)   PRAPARE - Administrator, Civil Service (Medical): No    Lack of Transportation (Non-Medical): No  Physical Activity: Insufficiently Active (10/02/2022)   Exercise Vital Sign    Days of  Exercise per Week: 2 days    Minutes of Exercise per Session: 20 min  Stress: No Stress Concern Present (10/02/2022)   [employer] of Occupational Health - Occupational Stress Questionnaire    Feeling of Stress : Not at all  Social Connections: Unknown (10/02/2022)   Social Connection and Isolation Panel [NHANES]    Frequency of Communication with Friends and Family: More than three times a week    Frequency of Social Gatherings with Friends and Family: Once a week    Attends Religious Services: Not on Marketing executive or Organizations: Yes    Attends Banker Meetings: More than 4 times per year    Marital Status: Married    Tobacco Counseling Counseling given: Not Answered Tobacco comments: intermittent, short term smoker as a teen. Some second hand smoke in 45s & 30s   Clinical Intake:  Pre-visit preparation completed: Yes  Pain : No/denies pain     BMI - recorded: 24.65 Nutritional Risks: None Diabetes: No  How often do you need to have someone help you when you read instructions, pamphlets, or other written materials from your doctor or pharmacy?: 1 - Never  Interpreter Needed?: No  Information entered by :: Darious Rehman,  RMA   Activities of Daily Living    10/02/2022    9:04 AM 10/02/2022    7:58 AM  In your present state of health, do you have any difficulty performing the following activities:  Hearing? 0 0  Vision? 0 0  Difficulty concentrating or making decisions? 0 0  Walking or climbing stairs? 0 0  Dressing or bathing? 0 0  Doing errands, shopping? 0 0  Preparing Food and eating ? N N  Using the Toilet? N N  In the past six months, have you accidently leaked urine? N N  Do you have problems with loss of bowel control? N N  Managing your Medications? N N  Managing your Finances? N N  Housekeeping or managing your Housekeeping? N N    Patient Care Team: Pincus Sanes, MD as PCP - General (Internal Medicine) Erlene Quan Vinnie Level, [facility] (Inactive) (Pharmacist) Hurshel Party, OD as Consulting Physician (Optometry) Luciana Axe, Alford Highland, MD as Consulting Physician (Ophthalmology)  Indicate any recent Medical Services you may have received from other than [facility] providers in the past year (date may be approximate).     Assessment:   This is a routine wellness examination for [place].  Hearing/Vision screen Hearing Screening - Comments:: Denies hearing difficulties   Vision Screening - Comments:: Wears eyeglasses  Dietary issues and exercise activities discussed:     Goals Addressed               This Visit's Progress     COMPLETED: My goal is to get back into the gym and restart Silver Sneakers program. (pt-stated)   Not on track     Just finished up with PT and will soon get back out there.      Depression Screen    10/02/2022    9:13 AM 05/24/2022   10:54 AM 04/20/2022    2:06 PM 03/19/2022    8:36 AM 02/12/2022    8:32 AM 09/07/2021    8:52 AM 08/11/2021    9:20 AM  PHQ 2/9 Scores  PHQ - 2 Score 0 0 0 0 0 0 0  PHQ- 9 Score 0 0 2 0 0 0 0    Fall Risk  10/02/2022    9:09 AM 10/02/2022    7:58 AM 03/19/2022    8:34 AM 02/12/2022    8:31 AM 09/07/2021    8:49 AM  Fall Risk    Falls in the past year? 1 1 0 0 0  Number falls in past yr: 1 1 0 0 0  Injury with Fall? 1 1 0 0 0  Risk for fall due to : Impaired balance/gait  No Fall Risks No Fall Risks No Fall Risks  Risk for fall due to: Comment per patient- low BP      Follow up Falls evaluation completed;Falls prevention discussed  Falls evaluation completed Falls evaluation completed Falls evaluation completed    MEDICARE RISK AT HOME:   TIMED UP AND GO:  Was the test performed?  No    Cognitive Function:        10/02/2022    9:11 AM 09/07/2021    9:04 AM  6CIT Screen  What Year? 0 points 0 points  What month? 0 points 0 points  What time? 0 points 0 points  Count back from 20 0 points 0 points  Months in reverse 0 points 0 points  Repeat phrase 0 points 0 points  Total Score 0 points 0 points    Immunizations Immunization History  Administered Date(s) Administered   Fluad Quad(high Dose 65+) 11/21/2018, 02/03/2020   Influenza Whole 11/26/2007   Influenza-Unspecified 12/08/2015   PFIZER(Purple Top)SARS-COV-2 Vaccination 05/14/2019, 06/04/2019   Pneumococcal Conjugate-13 07/04/2017   Pneumococcal Polysaccharide-23 12/05/2018   Td 05/04/2010    TDAP status: Up to date  Flu Vaccine status: Declined, Education has been provided regarding the importance of this vaccine but patient still declined. Advised may receive this vaccine at local pharmacy or Health Dept. Aware to provide a copy of the vaccination record if obtained from local pharmacy or Health Dept. Verbalized acceptance and understanding.  Pneumococcal vaccine status: Up to date  Covid-19 vaccine status: Completed vaccines  Qualifies for Shingles Vaccine? Yes   Zostavax completed No   Shingrix Completed?: No.    Education has been provided regarding the importance of this vaccine. Patient has been advised to call insurance company to determine out of pocket expense if they have not yet received this vaccine. Advised may also receive  vaccine at local pharmacy or Health Dept. Verbalized acceptance and understanding.  Screening Tests Health Maintenance  Topic Date Due   Zoster Vaccines- Shingrix (1 of 2) Never done   DTaP/Tdap/Td (2 - Tdap) 05/03/2020   INFLUENZA VACCINE  10/04/2022   Medicare Annual Wellness (AWV)  10/02/2023   Colonoscopy  05/30/2025   Pneumonia Vaccine 60+ Years old  Completed   Hepatitis C Screening  Completed   HPV VACCINES  Aged Out   COVID-19 Vaccine  Discontinued    Health Maintenance  Health Maintenance Due  Topic Date Due   Zoster Vaccines- Shingrix (1 of 2) Never done   DTaP/Tdap/Td (2 - Tdap) 05/03/2020    Colorectal cancer screening: Type of screening: Colonoscopy. Completed 05/30/2020. Repeat every 5 years  Lung Cancer Screening: (Low Dose CT Chest recommended if Age 75-80 years, 20 pack-year currently smoking OR have quit w/in 15years.) does not qualify.   Lung Cancer Screening Referral: N/A  Additional Screening:  Hepatitis C Screening: does qualify; Completed 12/28/2015  Vision Screening: Recommended annual ophthalmology exams for early detection of glaucoma and other disorders of the eye. Is the patient up to date with their annual eye exam?  No  Who  is the provider or what is the name of the office in which the patient attends annual eye exams? Dr. Carlynn Purl If pt is not established with a provider, would they like to be referred to a provider to establish care? No .   Dental Screening: Recommended annual dental exams for proper oral hygiene    Community Resource Referral / Chronic Care Management: CRR required this visit?  No   CCM required this visit?  No     Plan:     I have personally reviewed and noted the following in the patient's chart:   Medical and social history Use of alcohol, tobacco or illicit drugs  Current medications and supplements including opioid prescriptions. Patient is not currently taking opioid prescriptions. Functional ability and  status Nutritional status Physical activity Advanced directives List of other physicians Hospitalizations, surgeries, and ER visits in previous 12 months Vitals Screenings to include cognitive, depression, and falls Referrals and appointments  In addition, I have reviewed and discussed with patient certain preventive protocols, quality metrics, and best practice recommendations. A written personalized care plan for preventive services as well as general preventive health recommendations were provided to patient.     Skylan Gift L Jennessa Trigo, CMA   10/02/2022   After Visit Summary: (MyChart) Due to this being a telephonic visit, the after visit summary with patients personalized plan was offered to patient via MyChart   Nurse Notes: Patient is due for his Tdap.  He will inquire about getting it during his up coming visit with Dr. Lawerance Bach in August.  He is also due for a annual eye exam with Dr. Carlynn Purl, which he will soon schedule that appointment.  Patient informed me that he did not want to schedule his AWV for next year today.

## 2022-10-07 ENCOUNTER — Encounter: Payer: Self-pay | Admitting: Internal Medicine

## 2022-10-07 NOTE — Progress Notes (Unsigned)
Subjective:    Patient ID: Charles Tate, male    DOB: 11-Jan-1951, 72 y.o.   MRN: [id number]     HPI Charles Tate is here for follow up of his chronic medical problems.  Last couple of weeks not feeling great.  For a month or so he has been doing much increased physical activity at one of his jobs that could be partially to blame for his increased fatigue.  The next couple weeks should be much more less physically active and may help him recover.  He has had some increased achiness recently-When waking up at night and in the morning - to go to the bathroom - hip, legs, neck ache  - resolved w/in 10 minutes in am.    SBP was in 150s - restart hydrochlorothiazide 12.5 daily.  Took for a couple of weeks then stopped. This morning today it was 147/88 at home.  He has brought his blood pressure cuff in here in the past and it was fairly accurate  Medications and allergies reviewed with patient and updated if appropriate.  Current Outpatient Medications on File Prior to Visit  Medication Sig Dispense Refill   aspirin EC 81 MG tablet Take 81 mg by mouth daily. Swallow whole.     atorvastatin (LIPITOR) 40 MG tablet TAKE 1 TABLET BY MOUTH EVERY DAY 90 tablet 3   azelastine (ASTELIN) 0.1 % nasal spray Place 1 spray into both nostrils 2 (two) times daily. Use in each nostril as directed 30 mL 12   Colostrum 500 MG CAPS Take 500 mg by mouth daily.     fluticasone (FLONASE) 50 MCG/ACT nasal spray Place 2 sprays into both nostrils daily. 16 mL 6   Misc Natural Products (SUPER GREENS PO) Take 1 Scoop by mouth daily.      telmisartan (MICARDIS) 80 MG tablet Take 1 tablet (80 mg total) by mouth daily. 90 tablet 3   No current facility-administered medications on file prior to visit.     Review of Systems  Constitutional:  Positive for fatigue. Negative for fever.  Respiratory:  Positive for wheezing (mild - from PND - not chronic). Negative for cough and shortness of breath.    Cardiovascular:  Positive for palpitations (anxiety related only). Negative for chest pain and leg swelling.  Neurological:  Positive for light-headedness (occ) and headaches (cervicogenic).       Objective:   Vitals:   10/08/22 0912  BP: 116/78  Pulse: 76  Temp: 98.4 F (36.9 C)  SpO2: 98%   BP Readings from Last 3 Encounters:  10/08/22 116/78  08/13/22 124/78  06/12/22 136/80   Wt Readings from Last 3 Encounters:  10/08/22 197 lb (89.4 kg)  10/02/22 192 lb (87.1 kg)  08/13/22 197 lb (89.4 kg)   Body mass index is 25.29 kg/m.    Physical Exam Constitutional:      General: He is not in acute distress.    Appearance: Normal appearance. He is not ill-appearing.  HENT:     Head: Normocephalic and atraumatic.  Eyes:     Conjunctiva/sclera: Conjunctivae normal.  Cardiovascular:     Rate and Rhythm: Normal rate and regular rhythm.     Heart sounds: Normal heart sounds.  Pulmonary:     Effort: Pulmonary effort is normal. No respiratory distress.     Breath sounds: Normal breath sounds. No wheezing or rales.  Musculoskeletal:     Right lower leg: No edema.     Left lower  leg: No edema.  Skin:    General: Skin is warm and dry.     Findings: No rash.  Neurological:     Mental Status: He is alert. Mental status is at baseline.  Psychiatric:        Mood and Affect: Mood normal.        Lab Results  Component Value Date   WBC 8.8 05/19/2022   HGB 14.0 05/19/2022   HCT 40.9 05/19/2022   PLT 167 05/19/2022   GLUCOSE 121 (H) 05/19/2022   CHOL 128 02/12/2022   TRIG 97.0 02/12/2022   HDL 36.80 (L) 02/12/2022   LDLDIRECT 161.0 12/05/2018   LDLCALC 72 02/12/2022   ALT 30 02/12/2022   AST 29 02/12/2022   NA 135 05/19/2022   K 3.7 05/19/2022   CL 101 05/19/2022   CREATININE 0.91 05/19/2022   BUN 17 05/19/2022   CO2 26 05/19/2022   TSH 1.37 02/12/2022   PSA 3.90 02/12/2022   INR 1.1 03/15/2020   HGBA1C 6.1 02/12/2022     Assessment & Plan:    See  Problem List for Assessment and Plan of chronic medical problems.

## 2022-10-07 NOTE — Patient Instructions (Addendum)
      Blood work was ordered.   The lab is on the first floor.    Medications changes include :   decrease hydrochlorothiazide 12.5 mg and start losartan 25 mg      Return in about 6 months (around 04/10/2023) for Physical Exam.

## 2022-10-08 ENCOUNTER — Ambulatory Visit: Payer: Medicare HMO | Admitting: Internal Medicine

## 2022-10-08 VITALS — BP 116/78 | HR 76 | Temp 98.4°F | Ht 74.0 in | Wt 197.0 lb

## 2022-10-08 DIAGNOSIS — E782 Mixed hyperlipidemia: Secondary | ICD-10-CM | POA: Diagnosis not present

## 2022-10-08 DIAGNOSIS — R7303 Prediabetes: Secondary | ICD-10-CM | POA: Diagnosis not present

## 2022-10-08 DIAGNOSIS — R5383 Other fatigue: Secondary | ICD-10-CM

## 2022-10-08 DIAGNOSIS — I10 Essential (primary) hypertension: Secondary | ICD-10-CM

## 2022-10-08 DIAGNOSIS — R931 Abnormal findings on diagnostic imaging of heart and coronary circulation: Secondary | ICD-10-CM

## 2022-10-08 DIAGNOSIS — I700 Atherosclerosis of aorta: Secondary | ICD-10-CM | POA: Diagnosis not present

## 2022-10-08 DIAGNOSIS — Z9889 Other specified postprocedural states: Secondary | ICD-10-CM

## 2022-10-08 LAB — LIPID PANEL
Cholesterol: 133 mg/dL (ref 0–200)
HDL: 36.3 mg/dL — ABNORMAL LOW (ref >39.00)
LDL Cholesterol: 74 mg/dL (ref 0–99)
NonHDL: 96.65
Total CHOL/HDL Ratio: 4
Triglycerides: 114 mg/dL (ref 0.0–149.0)
VLDL: 22.8 mg/dL (ref 0.0–40.0)

## 2022-10-08 LAB — CBC WITH DIFFERENTIAL/PLATELET
Basophils Absolute: 0 10*3/uL (ref 0.0–0.1)
Basophils Relative: 1 % (ref 0.0–3.0)
Eosinophils Absolute: 0.1 10*3/uL (ref 0.0–0.7)
Eosinophils Relative: 1.4 % (ref 0.0–5.0)
HCT: 44.5 % (ref 39.0–52.0)
Hemoglobin: 14.8 g/dL (ref 13.0–17.0)
Lymphocytes Relative: 10.2 % — ABNORMAL LOW (ref 12.0–46.0)
Lymphs Abs: 0.5 10*3/uL — ABNORMAL LOW (ref 0.7–4.0)
MCHC: 33.2 g/dL (ref 30.0–36.0)
MCV: 89.1 fl (ref 78.0–100.0)
Monocytes Absolute: 0.4 10*3/uL (ref 0.1–1.0)
Monocytes Relative: 9 % (ref 3.0–12.0)
Neutro Abs: 3.5 10*3/uL (ref 1.4–7.7)
Neutrophils Relative %: 78.4 % — ABNORMAL HIGH (ref 43.0–77.0)
Platelets: 181 10*3/uL (ref 150.0–400.0)
RBC: 4.99 Mil/uL (ref 4.22–5.81)
RDW: 13 % (ref 11.5–15.5)
WBC: 4.5 10*3/uL (ref 4.0–10.5)

## 2022-10-08 LAB — COMPREHENSIVE METABOLIC PANEL
ALT: 17 U/L (ref 0–53)
AST: 19 U/L (ref 0–37)
Albumin: 4.2 g/dL (ref 3.5–5.2)
Alkaline Phosphatase: 74 U/L (ref 39–117)
BUN: 18 mg/dL (ref 6–23)
CO2: 30 mEq/L (ref 19–32)
Calcium: 9.4 mg/dL (ref 8.4–10.5)
Chloride: 103 mEq/L (ref 96–112)
Creatinine, Ser: 0.74 mg/dL (ref 0.40–1.50)
GFR: 90.04 mL/min (ref >60.00)
Glucose, Bld: 90 mg/dL (ref 70–99)
Potassium: 4.5 mEq/L (ref 3.5–5.1)
Sodium: 138 mEq/L (ref 135–145)
Total Bilirubin: 0.4 mg/dL (ref 0.2–1.2)
Total Protein: 7.2 g/dL (ref 6.0–8.3)

## 2022-10-08 LAB — HEMOGLOBIN A1C: Hgb A1c MFr Bld: 5.8 % (ref 4.6–6.5)

## 2022-10-08 NOTE — Assessment & Plan Note (Signed)
Chronic Continue atorvastatin 40 mg daily Continue healthy diet, increase activity/exercise

## 2022-10-08 NOTE — Assessment & Plan Note (Addendum)
Chronic CMP BP well controlled here today, but has been a little elevated at home Did have 2 episodes that sounded like vasovagal syncope so we will have some leniency with BP He will continue to monitor at home so we can adjust medication if needed CBC, CMP Continue telmisartan 80 mg daily

## 2022-10-08 NOTE — Assessment & Plan Note (Signed)
New Has noted some increased fatigue over the past 2 weeks Just prior to this he was very physical with one of his jobs for approximately 1 month which has likely contributed No other concerning symptoms besides fatigue Check CBC, CMP Sleep is okay, but gets up to-3 times a night because of his prostate-could be contributing to some of his fatigue.  Has follow-up with urology-BPH medication made him hypotensive

## 2022-10-08 NOTE — Assessment & Plan Note (Signed)
Chronic CAC score 55.5 in 2021 Continue atorvastatin 40 mg daily, aspirin 81 mg daily No symptoms consistent with angina

## 2022-10-08 NOTE — Assessment & Plan Note (Signed)
Chronic History of retinal branch artery occlusion right eye LDL goal less than 70-has been at goal Continue atorvastatin 40 mg daily

## 2022-10-08 NOTE — Assessment & Plan Note (Signed)
Chronic Regular exercise and healthy diet encouraged Check lipid panel, CMP Continue atorvastatin 40 mg daily 

## 2022-10-08 NOTE — Assessment & Plan Note (Signed)
Chronic Check a1c Low sugar / carb diet Stressed regular exercise  

## 2022-10-31 ENCOUNTER — Other Ambulatory Visit: Payer: Self-pay

## 2022-10-31 ENCOUNTER — Ambulatory Visit
Admission: RE | Admit: 2022-10-31 | Discharge: 2022-10-31 | Disposition: A | Discharge status: Home / Self Care | Payer: Medicare HMO | Source: Ambulatory Visit | Attending: Internal Medicine | Admitting: Internal Medicine

## 2022-10-31 VITALS — BP 154/72 | HR 56 | Temp 98.4°F | Resp 17

## 2022-10-31 DIAGNOSIS — R300 Dysuria: Secondary | ICD-10-CM | POA: Diagnosis not present

## 2022-10-31 DIAGNOSIS — Z1152 Encounter for screening for COVID-19: Secondary | ICD-10-CM | POA: Diagnosis not present

## 2022-10-31 DIAGNOSIS — K5900 Constipation, unspecified: Secondary | ICD-10-CM | POA: Diagnosis not present

## 2022-10-31 DIAGNOSIS — J0101 Acute recurrent maxillary sinusitis: Secondary | ICD-10-CM | POA: Insufficient documentation

## 2022-10-31 LAB — POCT URINALYSIS DIP (MANUAL ENTRY)
Bilirubin, UA: NEGATIVE
Glucose, UA: NEGATIVE mg/dL
Ketones, POC UA: NEGATIVE mg/dL
Leukocytes, UA: NEGATIVE
Nitrite, UA: NEGATIVE
Protein Ur, POC: NEGATIVE mg/dL
Spec Grav, UA: 1.015 (ref 1.010–1.025)
Urobilinogen, UA: 0.2 E.U./dL
pH, UA: 7 (ref 5.0–8.0)

## 2022-10-31 MED ORDER — AMOXICILLIN-POT CLAVULANATE 875-125 MG PO TABS
1.0000 | ORAL_TABLET | Freq: Two times a day (BID) | ORAL | 0 refills | Status: DC
Start: 2022-10-31 — End: 2023-03-24

## 2022-10-31 NOTE — ED Provider Notes (Signed)
[facility]    CSN: [id number] Arrival date & time: 10/31/22  1338      History   Chief Complaint Chief Complaint  Patient presents with   Urinary Frequency    Need to see if I have uti - Entered by patient    HPI Charles Tate is a 72 y.o. male presents for evaluation of low-grade fevers and fatigue.  Patient reports 5 days ago he just did not feel well overall and noticed over the past couple days he has had urinary burning primarily at night with frequency.  No hematuria or nausea/vomiting or flank pain.  He has had a UTI before.  He also endorses some sinus pressure/pain over the past month with some aching teeth on the right side.  States he gets this when he has sinus infections and also states the way he feels now is similar to when he normally has a sinus infection.  He has been doing Flonase and Astelin.  He also reports some left lower abdominal pain and constipation.  States he did take a laxative in order to go recently and his stools have been much harder than normal.  He reports no change in diet and that he is staying hydrated.  No other concerns at this time.   Urinary Frequency Associated symptoms include abdominal pain.    Past Medical History:  Diagnosis Date   Allergy    Arthritis    left hip    GERD (gastroesophageal reflux disease)    per patient, resolved. 03/15/20   Heart murmur    adolescent rheumatic fever - developed murmur    Hyperlipidemia    Hypertension    RAD (reactive airway disease)    with RTIs   Tubular adenoma     Patient Active Problem List   Diagnosis Date Noted   Fatigue 10/08/2022   Syncope 05/24/2022   Postnasal drip 05/23/2022   Sinus infection 03/19/2022   COVID 11/13/2021   Nuclear sclerotic cataract of both eyes 11/08/2020   Right shoulder pain 06/27/2020   S/P carotid endarterectomy, right 04/11/2020   Serrated adenoma of colon 03/12/2020   Aortic atherosclerosis (HCC) 02/03/2020   Stenosis of right  carotid artery 01/26/2020   Branch retinal artery occlusion, right eye 01/19/2020   Posterior vitreous detachment of both eyes 01/19/2020   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) 08/31/2019   Tear of MCL (medial collateral ligament) of knee, left, initial encounter 08/05/2017   Left inguinal hernia 07/04/2017   Allergic rhinitis 12/28/2015   H/O: rheumatic fever 12/28/2015   Prediabetes 12/27/2015   Rising PSA level 02/21/2014   Personal history of colonic polyps 02/17/2014   BPH with obstruction/lower urinary tract symptoms 05/04/2010   Hyperlipidemia 01/07/2009   Essential hypertension 09/26/2006    Past Surgical History:  Procedure Laterality Date   COLONOSCOPY     colonoscopy with polypectomy  2013   Dr Noe Gens, [place]   COLONOSCOPY WITH PROPOFOL N/A 07/01/2019   Procedure: COLONOSCOPY WITH PROPOFOL;  Surgeon: Lemar Lofty., MD;  Location: [facility];  Service: Gastroenterology;  Laterality: N/A;   COLONOSCOPY WITH PROPOFOL N/A 05/30/2020   Procedure: COLONOSCOPY WITH PROPOFOL;  Surgeon: Meridee Score Netty Starring., MD;  Location: [facility];  Service: Gastroenterology;  Laterality: N/A;   ENDARTERECTOMY Right 04/11/2020   Procedure: Right Carotid Endarterectomy;  Surgeon: Sherren Kerns, MD;  Location: [facility] OR;  Service: Vascular;  Laterality: Right;   ENDOSCOPIC MUCOSAL RESECTION N/A 07/01/2019  Procedure: ENDOSCOPIC MUCOSAL RESECTION;  Surgeon: Meridee Score Netty Starring., MD;  Location: [facility];  Service: Gastroenterology;  Laterality: N/A;   ENDOSCOPIC MUCOSAL RESECTION N/A 05/30/2020   Procedure: ENDOSCOPIC MUCOSAL RESECTION;  Surgeon: Meridee Score Netty Starring., MD;  Location: [facility];  Service: Gastroenterology;  Laterality: N/A;   HEMOSTASIS CLIP PLACEMENT  07/01/2019   Procedure: HEMOSTASIS CLIP PLACEMENT;  Surgeon: Lemar Lofty., MD;  Location: [facility];  Service: Gastroenterology;;   POLYPECTOMY     POLYPECTOMY  07/01/2019    Procedure: POLYPECTOMY;  Surgeon: Lemar Lofty., MD;  Location: [facility];  Service: Gastroenterology;;   POLYPECTOMY  05/30/2020   Procedure: POLYPECTOMY;  Surgeon: Lemar Lofty., MD;  Location: [facility];  Service: Gastroenterology;;   SUBMUCOSAL LIFTING INJECTION  07/01/2019   Procedure: SUBMUCOSAL LIFTING INJECTION;  Surgeon: Lemar Lofty., MD;  Location: [facility];  Service: Gastroenterology;;   SUBMUCOSAL TATTOO INJECTION  07/01/2019   Procedure: SUBMUCOSAL TATTOO INJECTION;  Surgeon: Lemar Lofty., MD;  Location: [facility];  Service: Gastroenterology;;   WISDOM TOOTH EXTRACTION         Home Medications    Prior to Admission medications   Medication Sig Start Date End Date Taking? Authorizing Provider  amoxicillin-clavulanate (AUGMENTIN) 875-125 MG tablet Take 1 tablet by mouth every 12 (twelve) hours. 10/31/22  Yes Radford Pax, NP  aspirin EC 81 MG tablet Take 81 mg by mouth daily. Swallow whole.    [provider]  atorvastatin (LIPITOR) 40 MG tablet TAKE 1 TABLET BY MOUTH EVERY DAY 01/15/22   Burns, Bobette Mo, MD  azelastine (ASTELIN) 0.1 % nasal spray Place 1 spray into both nostrils 2 (two) times daily. Use in each nostril as directed 05/24/22   Pincus Sanes, MD  Colostrum 500 MG CAPS Take 500 mg by mouth daily.    [provider]  fluticasone (FLONASE) 50 MCG/ACT nasal spray Place 2 sprays into both nostrils daily. 05/24/22   Pincus Sanes, MD  Misc Natural Products (SUPER GREENS PO) Take 1 Scoop by mouth daily.     [provider]  telmisartan (MICARDIS) 80 MG tablet Take 1 tablet (80 mg total) by mouth daily. 02/12/22   Pincus Sanes, MD    Family History Family History  Problem Relation Age of Onset   Arthritis Mother    Hyperlipidemia Mother    Lupus Mother    Heart attack Father 87   Hypertension Sister    Heart attack Paternal Grandmother 78   Diabetes Paternal Aunt    Stroke  Maternal Grandfather 4   Cancer Paternal Grandfather        ? stomach; in 43s   Colon cancer Paternal Grandfather    Colon polyps Neg Hx    Esophageal cancer Neg Hx    Rectal cancer Neg Hx    Stomach cancer Neg Hx    Inflammatory bowel disease Neg Hx    Liver disease Neg Hx    Pancreatic cancer Neg Hx     Social History Social History   Tobacco Use   Smoking status: Former   Smokeless tobacco: Never   Tobacco comments:    intermittent, short term smoker as a teen. Some second hand smoke in 14s & 30s  Vaping Use   Vaping status: Never Used  Substance Use Topics   Alcohol use: Not Currently   Drug use: No     Allergies   Ciprofloxacin hcl, Cefdinir, Tamsulosin, and Lisinopril   Review of Systems Review  of Systems  Constitutional:  Positive for fatigue and fever.  HENT:  Positive for congestion, sinus pressure and sinus pain.   Gastrointestinal:  Positive for abdominal pain and constipation.  Genitourinary:  Positive for dysuria and frequency.     Physical Exam Triage Vital Signs ED Triage Vitals [10/31/22 1400]  Encounter Vitals Group     BP (!) 154/72     Systolic BP Percentile      Diastolic BP Percentile      Pulse Rate (!) 56     Resp 17     Temp 98.4 F (36.9 C)     Temp Source Oral     SpO2 97 %     Weight      Height      Head Circumference      Peak Flow      Pain Score 0     Pain Loc      Pain Education      Exclude from Growth Chart    No data found.  Updated Vital Signs BP (!) 154/72 (BP Location: Left Arm)   Pulse (!) 56   Temp 98.4 F (36.9 C) (Oral)   Resp 17   SpO2 97%   Visual Acuity Right Eye Distance:   Left Eye Distance:   Bilateral Distance:    Right Eye Near:   Left Eye Near:    Bilateral Near:     Physical Exam Vitals and nursing note reviewed.  Constitutional:      General: He is not in acute distress.    Appearance: Normal appearance. He is not ill-appearing or toxic-appearing.  HENT:     Head:  Normocephalic and atraumatic.     Right Ear: Tympanic membrane and ear canal normal.     Left Ear: Tympanic membrane and ear canal normal.     Nose: Congestion present.     Right Turbinates: Pale.     Left Turbinates: Not swollen or pale.     Right Sinus: Maxillary sinus tenderness present. No frontal sinus tenderness.     Left Sinus: No maxillary sinus tenderness or frontal sinus tenderness.     Mouth/Throat:     Mouth: Mucous membranes are moist.     Pharynx: No oropharyngeal exudate or posterior oropharyngeal erythema.  Eyes:     Pupils: Pupils are equal, round, and reactive to light.  Cardiovascular:     Rate and Rhythm: Normal rate and regular rhythm.     Heart sounds: Normal heart sounds.  Pulmonary:     Effort: Pulmonary effort is normal.     Breath sounds: Normal breath sounds.  Abdominal:     General: Bowel sounds are normal. There is no distension.     Palpations: Abdomen is soft. There is no mass.     Tenderness: There is no abdominal tenderness. There is no right CVA tenderness, left CVA tenderness, guarding or rebound.  Musculoskeletal:     Cervical back: Normal range of motion and neck supple.  Lymphadenopathy:     Cervical: No cervical adenopathy.  Skin:    General: Skin is warm and dry.  Neurological:     General: No focal deficit present.     Mental Status: He is alert and oriented to person, place, and time.  Psychiatric:        Mood and Affect: Mood normal.        Behavior: Behavior normal.      UC Treatments / Results  Labs (all labs ordered  are listed, but only abnormal results are displayed) Labs Reviewed  POCT URINALYSIS DIP (MANUAL ENTRY) - Abnormal; Notable for the following components:      Result Value   Color, UA light yellow (*)    Blood, UA trace-intact (*)    All other components within normal limits  SARS CORONAVIRUS 2 (TAT 6-24 HRS)  URINE CULTURE    EKG   Radiology No results found.  Procedures Procedures (including critical  care time)  Medications Ordered in UC Medications - No data to display  Initial Impression / Assessment and Plan / UC Course  I have reviewed the triage vital signs and the nursing notes.  Pertinent labs & imaging results that were available during my care of the patient were reviewed by me and considered in my medical decision making (see chart for details).     Reviewed exam and symptoms with patient.  No red flags.  UA negative for UTI with only trace blood.  Will culture and contact if positive.  COVID PCR and will contact if positive as well.  Will treat sinus symptoms given length of symptoms with Augmentin.  This will also cover potential UTI.  Continue Flonase and Astelin as needed.  Abdominal exam unremarkable.  Discussed constipation with increasing fluids and fibers and he should try over-the-counter MiraLAX.  PCP follow-up if symptoms do not improve.  ER precautions reviewed and patient verbalized understanding. Final Clinical Impressions(s) / UC Diagnoses   Final diagnoses:  Dysuria  Acute recurrent maxillary sinusitis  Constipation, unspecified constipation type     Discharge Instructions      The clinical contact you with results of the COVID testing and urine culture if positive.  Start Augmentin twice daily for 7 days that will treat your sinus infection and will cover any potential urinary tract infection as well.  Lots of rest and fluids.  I recommend you do MiraLAX OTC to help with your constipation.  Please follow-up with your PCP if your symptoms do not improve.  Please go to the ER for any worsening symptoms.  I hope you feel better soon!    ED Prescriptions     Medication Sig Dispense Auth. Provider   amoxicillin-clavulanate (AUGMENTIN) 875-125 MG tablet Take 1 tablet by mouth every 12 (twelve) hours. 14 tablet Radford Pax, NP      PDMP not reviewed this encounter.   Radford Pax, NP 10/31/22 [phone]

## 2022-10-31 NOTE — ED Triage Notes (Signed)
Pt prsents with c/o fever x 2 days. States he felt light headed, tried eating and drinking fluids. Pt states for 1 week he has had urinary issues and has been constantly going. C/o lower abd pain that radiates to the lt side.

## 2022-10-31 NOTE — Discharge Instructions (Addendum)
The clinical contact you with results of the COVID testing and urine culture if positive.  Start Augmentin twice daily for 7 days that will treat your sinus infection and will cover any potential urinary tract infection as well.  Lots of rest and fluids.  I recommend you do MiraLAX OTC to help with your constipation.  Please follow-up with your PCP if your symptoms do not improve.  Please go to the ER for any worsening symptoms.  I hope you feel better soon!

## 2022-11-01 LAB — URINE CULTURE: Culture: NO GROWTH

## 2022-11-01 LAB — SARS CORONAVIRUS 2 (TAT 6-24 HRS): SARS Coronavirus 2: NEGATIVE

## 2022-12-11 ENCOUNTER — Other Ambulatory Visit: Payer: Self-pay | Admitting: Sports Medicine

## 2022-12-11 DIAGNOSIS — S82045D Nondisplaced comminuted fracture of left patella, subsequent encounter for closed fracture with routine healing: Secondary | ICD-10-CM

## 2022-12-11 DIAGNOSIS — G8929 Other chronic pain: Secondary | ICD-10-CM

## 2022-12-11 NOTE — Progress Notes (Unsigned)
PT referral placed.

## 2022-12-19 ENCOUNTER — Ambulatory Visit: Payer: Medicare HMO | Attending: Sports Medicine | Admitting: Physical Therapy

## 2022-12-19 DIAGNOSIS — M25662 Stiffness of left knee, not elsewhere classified: Secondary | ICD-10-CM | POA: Diagnosis not present

## 2022-12-19 DIAGNOSIS — M6281 Muscle weakness (generalized): Secondary | ICD-10-CM | POA: Diagnosis not present

## 2022-12-19 DIAGNOSIS — G8929 Other chronic pain: Secondary | ICD-10-CM | POA: Insufficient documentation

## 2022-12-19 DIAGNOSIS — M25562 Pain in left knee: Secondary | ICD-10-CM | POA: Insufficient documentation

## 2022-12-19 DIAGNOSIS — S82045D Nondisplaced comminuted fracture of left patella, subsequent encounter for closed fracture with routine healing: Secondary | ICD-10-CM | POA: Diagnosis not present

## 2022-12-19 DIAGNOSIS — R2681 Unsteadiness on feet: Secondary | ICD-10-CM | POA: Insufficient documentation

## 2022-12-19 NOTE — Therapy (Signed)
OUTPATIENT PHYSICAL THERAPY LOWER EXTREMITY EVALUATION   Patient Name: Charles Tate MRN: [id number] DOB:[birth year], 71 y.o., male Today's Date: 12/19/2022  END OF SESSION:  PT End of Session - 12/19/22 1401     Visit Number 1    Date for PT Re-Evaluation 03/20/23    Authorization Type [employer]    PT Start Time 1358    PT Stop Time 1443    PT Time Calculation (min) 45 min    Activity Tolerance Patient tolerated treatment well    Behavior During Therapy WFL for tasks assessed/performed              Past Medical History:  Diagnosis Date   Allergy    Arthritis    left hip    GERD (gastroesophageal reflux disease)    per patient, resolved. 03/15/20   Heart murmur    adolescent rheumatic fever - developed murmur    Hyperlipidemia    Hypertension    RAD (reactive airway disease)    with RTIs   Tubular adenoma    Past Surgical History:  Procedure Laterality Date   COLONOSCOPY     colonoscopy with polypectomy  2013   Dr Noe Gens, [place]   COLONOSCOPY WITH PROPOFOL N/A 07/01/2019   Procedure: COLONOSCOPY WITH PROPOFOL;  Surgeon: Lemar Lofty., MD;  Location: [facility];  Service: Gastroenterology;  Laterality: N/A;   COLONOSCOPY WITH PROPOFOL N/A 05/30/2020   Procedure: COLONOSCOPY WITH PROPOFOL;  Surgeon: Meridee Score Netty Starring., MD;  Location: [facility];  Service: Gastroenterology;  Laterality: N/A;   ENDARTERECTOMY Right 04/11/2020   Procedure: Right Carotid Endarterectomy;  Surgeon: Sherren Kerns, MD;  Location: [facility] OR;  Service: Vascular;  Laterality: Right;   ENDOSCOPIC MUCOSAL RESECTION N/A 07/01/2019   Procedure: ENDOSCOPIC MUCOSAL RESECTION;  Surgeon: Meridee Score Netty Starring., MD;  Location: [facility];  Service: Gastroenterology;  Laterality: N/A;   ENDOSCOPIC MUCOSAL RESECTION N/A 05/30/2020   Procedure: ENDOSCOPIC MUCOSAL RESECTION;  Surgeon: Meridee Score Netty Starring., MD;  Location: [facility];  Service: Gastroenterology;  Laterality:  N/A;   HEMOSTASIS CLIP PLACEMENT  07/01/2019   Procedure: HEMOSTASIS CLIP PLACEMENT;  Surgeon: Lemar Lofty., MD;  Location: [facility];  Service: Gastroenterology;;   POLYPECTOMY     POLYPECTOMY  07/01/2019   Procedure: POLYPECTOMY;  Surgeon: Lemar Lofty., MD;  Location: [facility];  Service: Gastroenterology;;   POLYPECTOMY  05/30/2020   Procedure: POLYPECTOMY;  Surgeon: Lemar Lofty., MD;  Location: [facility];  Service: Gastroenterology;;   Sunnie Nielsen LIFTING INJECTION  07/01/2019   Procedure: SUBMUCOSAL LIFTING INJECTION;  Surgeon: Lemar Lofty., MD;  Location: [facility];  Service: Gastroenterology;;   SUBMUCOSAL TATTOO INJECTION  07/01/2019   Procedure: SUBMUCOSAL TATTOO INJECTION;  Surgeon: Lemar Lofty., MD;  Location: [facility];  Service: Gastroenterology;;   WISDOM TOOTH EXTRACTION     Patient Active Problem List   Diagnosis Date Noted   Fatigue 10/08/2022   Syncope 05/24/2022   Postnasal drip 05/23/2022   Sinus infection 03/19/2022   COVID 11/13/2021   Nuclear sclerotic cataract of both eyes 11/08/2020   Right shoulder pain 06/27/2020   S/P carotid endarterectomy, right 04/11/2020   Serrated adenoma of colon 03/12/2020   Aortic atherosclerosis (HCC) 02/03/2020   Stenosis of right carotid artery 01/26/2020   Branch retinal artery occlusion, right eye 01/19/2020   Posterior vitreous detachment of both eyes 01/19/2020   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) 08/31/2019   Tear of MCL (medial collateral ligament)  of knee, left, initial encounter 08/05/2017   Left inguinal hernia 07/04/2017   Allergic rhinitis 12/28/2015   H/O: rheumatic fever 12/28/2015   Prediabetes 12/27/2015   Rising PSA level 02/21/2014   History of colonic polyps 02/17/2014   BPH with obstruction/lower urinary tract symptoms 05/04/2010   Hyperlipidemia 01/07/2009   Essential hypertension 09/26/2006    PCP: Cheryll Cockayne  REFERRING PROVIDER: Richardean Sale  REFERRING DIAG:  [id number] (ICD-10-CM) - Acute pain of left knee  S82.045D (ICD-10-CM) - Closed nondisplaced comminuted fracture of left patella with routine healing, subsequent encounter    THERAPY DIAG:  Muscle weakness (generalized)  Stiffness of left knee, not elsewhere classified  Acute pain of left knee  Unsteadiness on feet  Rationale for Evaluation and Treatment: Rehabilitation  ONSET DATE: 05/19/22  SUBJECTIVE:   SUBJECTIVE STATEMENT: Patient had a patella fracture on the left back in February, we saw him in PT and he did well.  He reports that he was doing well but that he has been unsteady recently and has difficulty going up stairs 1 at a time, putting on pants, and at times it feels like it wobbles  PERTINENT HISTORY: 05/19/2022 ED Patient presents with his son who assists with the history. Patient has had multiple episodes of prodromal syncope, has been seen by his physician, has had adjustments of his antihypertensives. His most recent episode was last month, resulting in a fall in which he broke his left knee. Today he notes he did not have a typical amount of food intake, had an episode of lightheadedness while upright, fell, no chest pain before or after the event. Since the event has had pain in his head, neck, soreness in his jaw. No confusion, disorientation, no broken teeth. Son notes the patient is interacting in a typical manner currently.    05/22/2022 Patient states knee feels great, had a syncope episode was standing for a longtime , broken nose, decreased ROM neck , thinks the falling could be a result in changes in medicine   PAIN:  Are you having pain? No  PRECAUTIONS: Fall  WEIGHT BEARING RESTRICTIONS: Yes WBAT  FALLS:  Has patient fallen in last 6 months? none  LIVING ENVIRONMENT: Lives with: lives with their spouse Lives in: House/apartment Stairs: No Has following equipment at home: Single point  cane, Environmental consultant - 2 wheeled, and Crutches  OCCUPATION: Semi-retired, run a Patent examiner business, sells insurance, and plays in a band  PLOF: Independent  PATIENT GOALS: helping the knee heal, increase strength to support the knee  NEXT MD VISIT: none scheduled  OBJECTIVE:   DIAGNOSTIC FINDINGS:  IMPRESSION: 1. No evidence of acute fracture or dislocation 2. Small suprapatellar joint effusion. 3. Remote patellar fracture.   Closed nondisplaced comminuted fracture of left patella with routine healing, subsequent encounter -Subacute, improving, complicated, subsequent visit - Interval healing of comminuted fracture of left patella based on HPI, physical exam, x-ray images from ER visit on 05/19/2022 after repeat fall - Patient is 6-7 weeks out from fall which caused comminuted left patella fracture.  He has no tenderness to palpation of patella and no pain with weightbearing with knee and hinged knee brace - May advance to weightbearing as tolerated.  Recommend continuing to use hinged knee brace   COGNITION: Overall cognitive status: Within functional limits for tasks assessed     SENSATION: WFL  EDEMA:  none POSTURE: rounded shoulders and flexed trunk   PALPATION: Mild tenderness in the left medial knee and the left  distal ITB LOWER EXTREMITY ROM:  Active ROM Right eval Left eval  Hip flexion    Hip extension    Hip abduction    Hip adduction    Hip internal rotation    Hip external rotation    Knee flexion  120  Knee extension  5  Ankle dorsiflexion    Ankle plantarflexion    Ankle inversion    Ankle eversion     (Blank rows = not tested)  LOWER EXTREMITY MMT:  MMT Right eval Left eval  Hip flexion  4  Hip extension    Hip abduction    Hip adduction    Hip internal rotation    Hip external rotation    Knee flexion  4  Knee extension  4  Ankle dorsiflexion    Ankle plantarflexion    Ankle inversion    Ankle eversion     (Blank rows = not  tested)  FUNCTIONAL TESTS:  5 times sit to stand: 18 seconds some fatigue and pain in the left knee and mms around the knee Timed up and go (TUG): 12s Standing SLS:  left leg very unsteady and a lot of extraneous movement to stay balanced but could only do 9 seconds  Left circumferential measurements 5" above the superior patella 45cm, right 46 cm  Ligamentous tests:  negative ACL, PCL, MCL, had some laxity of the left LCL GAIT: Distance walked: 100 feet Assistive device utilized: None Level of assistance: independent Comments: seems to have some decreased coordination  TODAY'S TREATMENT:                                                                                                                              DATE:     PATIENT EDUCATION:  Education details: POC and HEP Person educated: Patient Education method: Explanation Education comprehension: verbalized understanding  HOME EXERCISE PROGRAM: 4" side step ups  ASSESSMENT:  CLINICAL IMPRESSION: Patient is a 72 y.o. male who was seen today for physical therapy evaluation and treatment for L knee pain following a patella fracture from a fall. His fall was in February and was seen here by PT and did very well with the leg.  He reports that recently he has noticed that he is still very weak, uses hands to stand from sitting.   Reports that he goes up stairs one at a time.  He has some LCL laxity.  Seems to have some unsteadiness of the left leg, difficulty with SLS. Patient will benefit from PT to address his gait abnormalities, work on his L knee ROM and strength to be able to return to PLOF.  OBJECTIVE IMPAIRMENTS: decreased mobility, difficulty walking, and decreased ROM.   REHAB POTENTIAL: Good  CLINICAL DECISION MAKING: Stable/uncomplicated  EVALUATION COMPLEXITY: Low   GOALS: Goals reviewed with patient? Yes  SHORT TERM GOALS: Target date: 01/04/23  Patient will be independent with initial HEP. Goal status:  INITIAL  LONG TERM  GOALS: Target date: 03/20/23  Patient will be independent with advanced/ongoing HEP to improve outcomes and carryover.  Goal status: INITIAL  2.  Patient will demonstrate improved L knee AROM to >/= 0-125 deg to allow for normal gait and stair mechanics. Goal status: INITIAL  3.  Patient will demonstrate improved functional LE strength as demonstrated by 5xSTS < 12s without UE support. Goal status: INITIAL  4.  Patient will be able to go up and downs stairs step over step without difficulty or pain  Goal status: INITIAL  5. Increase left knee quad strength to 4+/5 Goal status: INITIAL   PLAN:  PT FREQUENCY: 1x/week  PT DURATION: 12 weeks  PLANNED INTERVENTIONS: Therapeutic exercises, Therapeutic activity, Neuromuscular re-education, Balance training, Gait training, Patient/Family education, Self Care, Joint mobilization, Stair training, Dry Needling, Electrical stimulation, Cryotherapy, Moist heat, Vasopneumatic device, and Manual therapy  PLAN FOR NEXT SESSION: work on quad strength and balance/coordination   Lashan Macias W, PT 12/19/2022, 2:03 PM

## 2022-12-24 DIAGNOSIS — H52223 Regular astigmatism, bilateral: Secondary | ICD-10-CM | POA: Diagnosis not present

## 2022-12-26 ENCOUNTER — Ambulatory Visit: Payer: Medicare HMO | Admitting: Physical Therapy

## 2022-12-26 ENCOUNTER — Encounter: Payer: Self-pay | Admitting: Physical Therapy

## 2022-12-26 DIAGNOSIS — S82045D Nondisplaced comminuted fracture of left patella, subsequent encounter for closed fracture with routine healing: Secondary | ICD-10-CM | POA: Diagnosis not present

## 2022-12-26 DIAGNOSIS — M25562 Pain in left knee: Secondary | ICD-10-CM

## 2022-12-26 DIAGNOSIS — R2681 Unsteadiness on feet: Secondary | ICD-10-CM

## 2022-12-26 DIAGNOSIS — M6281 Muscle weakness (generalized): Secondary | ICD-10-CM

## 2022-12-26 DIAGNOSIS — M25662 Stiffness of left knee, not elsewhere classified: Secondary | ICD-10-CM | POA: Diagnosis not present

## 2022-12-26 DIAGNOSIS — G8929 Other chronic pain: Secondary | ICD-10-CM | POA: Diagnosis not present

## 2022-12-26 NOTE — Therapy (Signed)
OUTPATIENT PHYSICAL THERAPY LOWER EXTREMITY EVALUATION   Patient Name: Charles Tate MRN: [id number] DOB:08/02/1950, 72 y.o., male Today's Date: 12/26/2022  END OF SESSION:  PT End of Session - 12/26/22 1440     Visit Number 2    Date for PT Re-Evaluation 03/20/23    PT Start Time 1438    PT Stop Time 1515    PT Time Calculation (min) 37 min              Past Medical History:  Diagnosis Date   Allergy    Arthritis    left hip    GERD (gastroesophageal reflux disease)    per patient, resolved. 03/15/20   Heart murmur    adolescent rheumatic fever - developed murmur    Hyperlipidemia    Hypertension    RAD (reactive airway disease)    with RTIs   Tubular adenoma    Past Surgical History:  Procedure Laterality Date   COLONOSCOPY     colonoscopy with polypectomy  2013   Dr Noe Gens, [place]   COLONOSCOPY WITH PROPOFOL N/A 07/01/2019   Procedure: COLONOSCOPY WITH PROPOFOL;  Surgeon: Lemar Lofty., MD;  Location: [facility];  Service: Gastroenterology;  Laterality: N/A;   COLONOSCOPY WITH PROPOFOL N/A 05/30/2020   Procedure: COLONOSCOPY WITH PROPOFOL;  Surgeon: Meridee Score Netty Starring., MD;  Location: [facility];  Service: Gastroenterology;  Laterality: N/A;   ENDARTERECTOMY Right 04/11/2020   Procedure: Right Carotid Endarterectomy;  Surgeon: Sherren Kerns, MD;  Location: [facility] OR;  Service: Vascular;  Laterality: Right;   ENDOSCOPIC MUCOSAL RESECTION N/A 07/01/2019   Procedure: ENDOSCOPIC MUCOSAL RESECTION;  Surgeon: Meridee Score Netty Starring., MD;  Location: [facility];  Service: Gastroenterology;  Laterality: N/A;   ENDOSCOPIC MUCOSAL RESECTION N/A 05/30/2020   Procedure: ENDOSCOPIC MUCOSAL RESECTION;  Surgeon: Meridee Score Netty Starring., MD;  Location: [facility];  Service: Gastroenterology;  Laterality: N/A;   HEMOSTASIS CLIP PLACEMENT  07/01/2019   Procedure: HEMOSTASIS CLIP PLACEMENT;  Surgeon: Lemar Lofty., MD;  Location: [facility];   Service: Gastroenterology;;   POLYPECTOMY     POLYPECTOMY  07/01/2019   Procedure: POLYPECTOMY;  Surgeon: Lemar Lofty., MD;  Location: [facility];  Service: Gastroenterology;;   POLYPECTOMY  05/30/2020   Procedure: POLYPECTOMY;  Surgeon: Lemar Lofty., MD;  Location: [facility];  Service: Gastroenterology;;   Sunnie Nielsen LIFTING INJECTION  07/01/2019   Procedure: SUBMUCOSAL LIFTING INJECTION;  Surgeon: Lemar Lofty., MD;  Location: [facility];  Service: Gastroenterology;;   SUBMUCOSAL TATTOO INJECTION  07/01/2019   Procedure: SUBMUCOSAL TATTOO INJECTION;  Surgeon: Lemar Lofty., MD;  Location: [facility];  Service: Gastroenterology;;   WISDOM TOOTH EXTRACTION     Patient Active Problem List   Diagnosis Date Noted   Fatigue 10/08/2022   Syncope 05/24/2022   Postnasal drip 05/23/2022   Sinus infection 03/19/2022   COVID 11/13/2021   Nuclear sclerotic cataract of both eyes 11/08/2020   Right shoulder pain 06/27/2020   S/P carotid endarterectomy, right 04/11/2020   Serrated adenoma of colon 03/12/2020   Aortic atherosclerosis (HCC) 02/03/2020   Stenosis of right carotid artery 01/26/2020   Branch retinal artery occlusion, right eye 01/19/2020   Posterior vitreous detachment of both eyes 01/19/2020   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) 08/31/2019   Tear of MCL (medial collateral ligament) of knee, left, initial encounter 08/05/2017   Left inguinal hernia 07/04/2017   Allergic rhinitis 12/28/2015   H/O: rheumatic fever 12/28/2015   Prediabetes  12/27/2015   Rising PSA level 02/21/2014   History of colonic polyps 02/17/2014   BPH with obstruction/lower urinary tract symptoms 05/04/2010   Hyperlipidemia 01/07/2009   Essential hypertension 09/26/2006    PCP: Cheryll Cockayne  REFERRING PROVIDER: Richardean Sale  REFERRING DIAG:  [phone] (ICD-10-CM) - Acute pain of left knee  S82.045D (ICD-10-CM) - Closed nondisplaced  comminuted fracture of left patella with routine healing, subsequent encounter    THERAPY DIAG:  No diagnosis found.  Rationale for Evaluation and Treatment: Rehabilitation  ONSET DATE: 05/19/22  SUBJECTIVE:   SUBJECTIVE STATEMENT: Pain and weakness in the L knee  PERTINENT HISTORY: 05/19/2022 ED Patient presents with his son who assists with the history. Patient has had multiple episodes of prodromal syncope, has been seen by his physician, has had adjustments of his antihypertensives. His most recent episode was last month, resulting in a fall in which he broke his left knee. Today he notes he did not have a typical amount of food intake, had an episode of lightheadedness while upright, fell, no chest pain before or after the event. Since the event has had pain in his head, neck, soreness in his jaw. No confusion, disorientation, no broken teeth. Son notes the patient is interacting in a typical manner currently.    05/22/2022 Patient states knee feels great, had a syncope episode was standing for a longtime , broken nose, decreased ROM neck , thinks the falling could be a result in changes in medicine   PAIN:  Are you having pain? No  PRECAUTIONS: Fall  WEIGHT BEARING RESTRICTIONS: Yes WBAT  FALLS:  Has patient fallen in last 6 months? none  LIVING ENVIRONMENT: Lives with: lives with their spouse Lives in: House/apartment Stairs: No Has following equipment at home: Single point cane, Environmental consultant - 2 wheeled, and Crutches  OCCUPATION: Semi-retired, run a Patent examiner business, sells insurance, and plays in a band  PLOF: Independent  PATIENT GOALS: helping the knee heal, increase strength to support the knee  NEXT MD VISIT: none scheduled  OBJECTIVE:   DIAGNOSTIC FINDINGS:  IMPRESSION: 1. No evidence of acute fracture or dislocation 2. Small suprapatellar joint effusion. 3. Remote patellar fracture.   Closed nondisplaced comminuted fracture of left patella with  routine healing, subsequent encounter -Subacute, improving, complicated, subsequent visit - Interval healing of comminuted fracture of left patella based on HPI, physical exam, x-ray images from ER visit on 05/19/2022 after repeat fall - Patient is 6-7 weeks out from fall which caused comminuted left patella fracture.  He has no tenderness to palpation of patella and no pain with weightbearing with knee and hinged knee brace - May advance to weightbearing as tolerated.  Recommend continuing to use hinged knee brace   COGNITION: Overall cognitive status: Within functional limits for tasks assessed     SENSATION: WFL  EDEMA:  none POSTURE: rounded shoulders and flexed trunk   PALPATION: Mild tenderness in the left medial knee and the left distal ITB LOWER EXTREMITY ROM:  Active ROM Right eval Left eval  Hip flexion    Hip extension    Hip abduction    Hip adduction    Hip internal rotation    Hip external rotation    Knee flexion  120  Knee extension  5  Ankle dorsiflexion    Ankle plantarflexion    Ankle inversion    Ankle eversion     (Blank rows = not tested)  LOWER EXTREMITY MMT:  MMT Right eval Left eval  Hip  flexion  4  Hip extension    Hip abduction    Hip adduction    Hip internal rotation    Hip external rotation    Knee flexion  4  Knee extension  4  Ankle dorsiflexion    Ankle plantarflexion    Ankle inversion    Ankle eversion     (Blank rows = not tested)  FUNCTIONAL TESTS:  5 times sit to stand: 18 seconds some fatigue and pain in the left knee and mms around the knee Timed up and go (TUG): 12s Standing SLS:  left leg very unsteady and a lot of extraneous movement to stay balanced but could only do 9 seconds  Left circumferential measurements 5" above the superior patella 45cm, right 46 cm  Ligamentous tests:  negative ACL, PCL, MCL, had some laxity of the left LCL GAIT: Distance walked: 100 feet Assistive device utilized: None Level of  assistance: independent Comments: seems to have some decreased coordination  TODAY'S TREATMENT:                                                                                                                              DATE:  12/26/22 NuStep L 4 x 6 min LAQ LLE 3lb 2x12 HS curls green 2x10  S2S from elevated mat holding yellow ball 2x12 6in step ups x10 each Hip add ball squeeze 2x10 4in lateral step ups  PATIENT EDUCATION:  Education details: POC and HEP Person educated: Patient Education method: Explanation Education comprehension: verbalized understanding  HOME EXERCISE PROGRAM: 4" side step ups  ASSESSMENT:  CLINICAL IMPRESSION: Patient is a 72 y.o. male who was seen today for physical therapy evaluation and treatment for L knee pain following a patella fracture from a fall. His fall was in February and was seen here by PT and did very well with the leg.  Today pt enters with reports of L knee pain and LLE weakness. Visible L knee shaking present during the session with functional interventions. Pt did report some instability with step up as his LLLE started to fatigue  Patient will benefit from PT to address his gait abnormalities, work on his L knee ROM and strength to be able to return to PLOF.  OBJECTIVE IMPAIRMENTS: decreased mobility, difficulty walking, and decreased ROM.   REHAB POTENTIAL: Good  CLINICAL DECISION MAKING: Stable/uncomplicated  EVALUATION COMPLEXITY: Low   GOALS: Goals reviewed with patient? Yes  SHORT TERM GOALS: Target date: 01/04/23  Patient will be independent with initial HEP. Goal status: INITIAL  LONG TERM GOALS: Target date: 03/20/23  Patient will be independent with advanced/ongoing HEP to improve outcomes and carryover.  Goal status: INITIAL  2.  Patient will demonstrate improved L knee AROM to >/= 0-125 deg to allow for normal gait and stair mechanics. Goal status: INITIAL  3.  Patient will demonstrate improved functional LE  strength as demonstrated by 5xSTS < 12s without UE support. Goal status: INITIAL  4.  Patient will be able to go up and downs stairs step over step without difficulty or pain  Goal status: INITIAL  5. Increase left knee quad strength to 4+/5 Goal status: INITIAL   PLAN:  PT FREQUENCY: 1x/week  PT DURATION: 12 weeks  PLANNED INTERVENTIONS: Therapeutic exercises, Therapeutic activity, Neuromuscular re-education, Balance training, Gait training, Patient/Family education, Self Care, Joint mobilization, Stair training, Dry Needling, Electrical stimulation, Cryotherapy, Moist heat, Vasopneumatic device, and Manual therapy  PLAN FOR NEXT SESSION: work on quad strength and balance/coordination   Grayce Sessions, PTA 12/26/2022, 2:40 PM

## 2022-12-28 ENCOUNTER — Other Ambulatory Visit: Payer: Self-pay | Admitting: Internal Medicine

## 2022-12-31 ENCOUNTER — Ambulatory Visit: Payer: Medicare HMO | Admitting: Physical Therapy

## 2022-12-31 ENCOUNTER — Encounter: Payer: Self-pay | Admitting: Physical Therapy

## 2022-12-31 DIAGNOSIS — M6281 Muscle weakness (generalized): Secondary | ICD-10-CM | POA: Diagnosis not present

## 2022-12-31 DIAGNOSIS — R2681 Unsteadiness on feet: Secondary | ICD-10-CM

## 2022-12-31 DIAGNOSIS — M25662 Stiffness of left knee, not elsewhere classified: Secondary | ICD-10-CM

## 2022-12-31 DIAGNOSIS — M25562 Pain in left knee: Secondary | ICD-10-CM | POA: Diagnosis not present

## 2022-12-31 DIAGNOSIS — S82045D Nondisplaced comminuted fracture of left patella, subsequent encounter for closed fracture with routine healing: Secondary | ICD-10-CM | POA: Diagnosis not present

## 2022-12-31 DIAGNOSIS — G8929 Other chronic pain: Secondary | ICD-10-CM | POA: Diagnosis not present

## 2022-12-31 NOTE — Therapy (Signed)
OUTPATIENT PHYSICAL THERAPY LOWER EXTREMITY EVALUATION   Patient Name: Charles Tate MRN: [id number] DOB:[birth year], 72 y.o., male Today's Date: 12/31/2022  END OF SESSION:  PT End of Session - 12/31/22 0845     Visit Number 3    Date for PT Re-Evaluation 03/20/23    PT Start Time 0845    PT Stop Time 0930    PT Time Calculation (min) 45 min    Activity Tolerance Patient tolerated treatment well    Behavior During Therapy [facility] for tasks assessed/performed              Past Medical History:  Diagnosis Date   Allergy    Arthritis    left hip    GERD (gastroesophageal reflux disease)    per patient, resolved. 03/15/20   Heart murmur    adolescent rheumatic fever - developed murmur    Hyperlipidemia    Hypertension    RAD (reactive airway disease)    with RTIs   Tubular adenoma    Past Surgical History:  Procedure Laterality Date   COLONOSCOPY     colonoscopy with polypectomy  2013   Dr Noe Gens, [place]   COLONOSCOPY WITH PROPOFOL N/A 07/01/2019   Procedure: COLONOSCOPY WITH PROPOFOL;  Surgeon: Lemar Lofty., MD;  Location: [facility];  Service: Gastroenterology;  Laterality: N/A;   COLONOSCOPY WITH PROPOFOL N/A 05/30/2020   Procedure: COLONOSCOPY WITH PROPOFOL;  Surgeon: Meridee Score Netty Starring., MD;  Location: [facility];  Service: Gastroenterology;  Laterality: N/A;   ENDARTERECTOMY Right 04/11/2020   Procedure: Right Carotid Endarterectomy;  Surgeon: Sherren Kerns, MD;  Location: [facility] OR;  Service: Vascular;  Laterality: Right;   ENDOSCOPIC MUCOSAL RESECTION N/A 07/01/2019   Procedure: ENDOSCOPIC MUCOSAL RESECTION;  Surgeon: Meridee Score Netty Starring., MD;  Location: [facility];  Service: Gastroenterology;  Laterality: N/A;   ENDOSCOPIC MUCOSAL RESECTION N/A 05/30/2020   Procedure: ENDOSCOPIC MUCOSAL RESECTION;  Surgeon: Meridee Score Netty Starring., MD;  Location: [facility];  Service: Gastroenterology;  Laterality: N/A;   HEMOSTASIS CLIP PLACEMENT   07/01/2019   Procedure: HEMOSTASIS CLIP PLACEMENT;  Surgeon: Lemar Lofty., MD;  Location: [facility];  Service: Gastroenterology;;   POLYPECTOMY     POLYPECTOMY  07/01/2019   Procedure: POLYPECTOMY;  Surgeon: Lemar Lofty., MD;  Location: [facility];  Service: Gastroenterology;;   POLYPECTOMY  05/30/2020   Procedure: POLYPECTOMY;  Surgeon: Lemar Lofty., MD;  Location: [facility];  Service: Gastroenterology;;   Sunnie Nielsen LIFTING INJECTION  07/01/2019   Procedure: SUBMUCOSAL LIFTING INJECTION;  Surgeon: Lemar Lofty., MD;  Location: [facility];  Service: Gastroenterology;;   SUBMUCOSAL TATTOO INJECTION  07/01/2019   Procedure: SUBMUCOSAL TATTOO INJECTION;  Surgeon: Lemar Lofty., MD;  Location: [facility];  Service: Gastroenterology;;   WISDOM TOOTH EXTRACTION     Patient Active Problem List   Diagnosis Date Noted   Fatigue 10/08/2022   Syncope 05/24/2022   Postnasal drip 05/23/2022   Sinus infection 03/19/2022   COVID 11/13/2021   Nuclear sclerotic cataract of both eyes 11/08/2020   Right shoulder pain 06/27/2020   S/P carotid endarterectomy, right 04/11/2020   Serrated adenoma of colon 03/12/2020   Aortic atherosclerosis (HCC) 02/03/2020   Stenosis of right carotid artery 01/26/2020   Branch retinal artery occlusion, right eye 01/19/2020   Posterior vitreous detachment of both eyes 01/19/2020   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) 08/31/2019   Tear of MCL (medial collateral ligament) of knee, left, initial encounter 08/05/2017  Left inguinal hernia 07/04/2017   Allergic rhinitis 12/28/2015   H/O: rheumatic fever 12/28/2015   Prediabetes 12/27/2015   Rising PSA level 02/21/2014   History of colonic polyps 02/17/2014   BPH with obstruction/lower urinary tract symptoms 05/04/2010   Hyperlipidemia 01/07/2009   Essential hypertension 09/26/2006    PCP: Cheryll Cockayne  REFERRING PROVIDER: Richardean Sale  REFERRING DIAG:  [phone] (ICD-10-CM) - Acute pain of left knee  S82.045D (ICD-10-CM) - Closed nondisplaced comminuted fracture of left patella with routine healing, subsequent encounter    THERAPY DIAG:  Muscle weakness (generalized)  Stiffness of left knee, not elsewhere classified  Acute pain of left knee  Unsteadiness on feet  Rationale for Evaluation and Treatment: Rehabilitation  ONSET DATE: 05/19/22  SUBJECTIVE:   SUBJECTIVE STATEMENT: "I feel good"    PERTINENT HISTORY: 05/19/2022 ED Patient presents with his son who assists with the history. Patient has had multiple episodes of prodromal syncope, has been seen by his physician, has had adjustments of his antihypertensives. His most recent episode was last month, resulting in a fall in which he broke his left knee. Today he notes he did not have a typical amount of food intake, had an episode of lightheadedness while upright, fell, no chest pain before or after the event. Since the event has had pain in his head, neck, soreness in his jaw. No confusion, disorientation, no broken teeth. Son notes the patient is interacting in a typical manner currently.    05/22/2022 Patient states knee feels great, had a syncope episode was standing for a longtime , broken nose, decreased ROM neck , thinks the falling could be a result in changes in medicine   PAIN:  Are you having pain? No  PRECAUTIONS: Fall  WEIGHT BEARING RESTRICTIONS: Yes WBAT  FALLS:  Has patient fallen in last 6 months? none  LIVING ENVIRONMENT: Lives with: lives with their spouse Lives in: House/apartment Stairs: No Has following equipment at home: Single point cane, Environmental consultant - 2 wheeled, and Crutches  OCCUPATION: Semi-retired, run a Patent examiner business, sells insurance, and plays in a band  PLOF: Independent  PATIENT GOALS: helping the knee heal, increase strength to support the knee  NEXT MD VISIT: none scheduled  OBJECTIVE:    DIAGNOSTIC FINDINGS:  IMPRESSION: 1. No evidence of acute fracture or dislocation 2. Small suprapatellar joint effusion. 3. Remote patellar fracture.   Closed nondisplaced comminuted fracture of left patella with routine healing, subsequent encounter -Subacute, improving, complicated, subsequent visit - Interval healing of comminuted fracture of left patella based on HPI, physical exam, x-ray images from ER visit on 05/19/2022 after repeat fall - Patient is 6-7 weeks out from fall which caused comminuted left patella fracture.  He has no tenderness to palpation of patella and no pain with weightbearing with knee and hinged knee brace - May advance to weightbearing as tolerated.  Recommend continuing to use hinged knee brace   COGNITION: Overall cognitive status: Within functional limits for tasks assessed     SENSATION: WFL  EDEMA:  none POSTURE: rounded shoulders and flexed trunk   PALPATION: Mild tenderness in the left medial knee and the left distal ITB LOWER EXTREMITY ROM:  Active ROM Right eval Left eval  Hip flexion    Hip extension    Hip abduction    Hip adduction    Hip internal rotation    Hip external rotation    Knee flexion  120  Knee extension  5  Ankle dorsiflexion  Ankle plantarflexion    Ankle inversion    Ankle eversion     (Blank rows = not tested)  LOWER EXTREMITY MMT:  MMT Right eval Left eval  Hip flexion  4  Hip extension    Hip abduction    Hip adduction    Hip internal rotation    Hip external rotation    Knee flexion  4  Knee extension  4  Ankle dorsiflexion    Ankle plantarflexion    Ankle inversion    Ankle eversion     (Blank rows = not tested)  FUNCTIONAL TESTS:  5 times sit to stand: 18 seconds some fatigue and pain in the left knee and mms around the knee Timed up and go (TUG): 12s Standing SLS:  left leg very unsteady and a lot of extraneous movement to stay balanced but could only do 9 seconds  Left  circumferential measurements 5" above the superior patella 45cm, right 46 cm  Ligamentous tests:  negative ACL, PCL, MCL, had some laxity of the left LCL GAIT: Distance walked: 100 feet Assistive device utilized: None Level of assistance: independent Comments: seems to have some decreased coordination  TODAY'S TREATMENT:                                                                                                                              DATE:  12/31/22 NuStep L 4 x 6 min Leg press 40lb 2x10 Lateral 4in step ups  6in step ups x 10 each 30lb resisted gait 4 way x 3 each LLE LAQ 2x12 LLE HS curls green 2x12   12/26/22 NuStep L 4 x 6 min LAQ LLE 3lb 2x12 HS curls green 2x10  S2S from elevated mat holding yellow ball 2x12 6in step ups x10 each Hip add ball squeeze 2x10 4in lateral step ups  PATIENT EDUCATION:  Education details: POC and HEP Person educated: Patient Education method: Explanation Education comprehension: verbalized understanding  HOME EXERCISE PROGRAM: 4" side step ups  ASSESSMENT:  CLINICAL IMPRESSION: Patient is a 72 y.o. male who was seen today for physical therapy evaluation and treatment for L knee pain following a patella fracture from a fall. His fall was in February and was seen here by PT and did very well with the leg. Pt enters feeing well. He has L knee pain and discomfort when doing weight bearing activities putting pressure on the knee. Symptoms appear to move from medial to lateral L patella with activity. Encourade pt to do his lateral side step up at home. R patella does move more freely than L. Patient will benefit from PT to address his gait abnormalities, work on his L knee ROM and strength to be able to return to PLOF.  OBJECTIVE IMPAIRMENTS: decreased mobility, difficulty walking, and decreased ROM.   REHAB POTENTIAL: Good  CLINICAL DECISION MAKING: Stable/uncomplicated  EVALUATION COMPLEXITY: Low   GOALS: Goals reviewed  with patient? Yes  SHORT TERM GOALS:  Target date: 01/04/23  Patient will be independent with initial HEP. Goal status: Progressing 12/31/22  LONG TERM GOALS: Target date: 03/20/23  Patient will be independent with advanced/ongoing HEP to improve outcomes and carryover.  Goal status: INITIAL  2.  Patient will demonstrate improved L knee AROM to >/= 0-125 deg to allow for normal gait and stair mechanics. Goal status: INITIAL  3.  Patient will demonstrate improved functional LE strength as demonstrated by 5xSTS < 12s without UE support. Goal status: INITIAL  4.  Patient will be able to go up and downs stairs step over step without difficulty or pain  Goal status: INITIAL  5. Increase left knee quad strength to 4+/5 Goal status: INITIAL   PLAN:  PT FREQUENCY: 1x/week  PT DURATION: 12 weeks  PLANNED INTERVENTIONS: Therapeutic exercises, Therapeutic activity, Neuromuscular re-education, Balance training, Gait training, Patient/Family education, Self Care, Joint mobilization, Stair training, Dry Needling, Electrical stimulation, Cryotherapy, Moist heat, Vasopneumatic device, and Manual therapy  PLAN FOR NEXT SESSION: work on quad strength and balance/coordination   Grayce Sessions, PTA 12/31/2022, 8:46 AM

## 2023-01-02 ENCOUNTER — Telehealth: Payer: Self-pay

## 2023-01-03 NOTE — Telephone Encounter (Signed)
Left message for pt to call back to schedule with Dr. Allyson Sabal.

## 2023-01-04 NOTE — Telephone Encounter (Signed)
Pt returned your call.  

## 2023-01-04 NOTE — Telephone Encounter (Signed)
Spoke with pt regarding OV with Dr. Allyson Sabal, he is schedule for 12/9. Instructions given for [facility] location. Pt verbalizes understanding.

## 2023-01-04 NOTE — Therapy (Signed)
OUTPATIENT PHYSICAL THERAPY LOWER EXTREMITY TREATMENT   Patient Name: Charles Tate MRN: [id number] DOB:12-Feb-1951, 72 y.o., male Today's Date: 01/07/2023  END OF SESSION:  PT End of Session - 01/07/23 1356     Visit Number 4    Date for PT Re-Evaluation 03/20/23    PT Start Time 1400    PT Stop Time 1445    PT Time Calculation (min) 45 min    Activity Tolerance Patient tolerated treatment well    Behavior During Therapy WFL for tasks assessed/performed               Past Medical History:  Diagnosis Date   Allergy    Arthritis    left hip    GERD (gastroesophageal reflux disease)    per patient, resolved. 03/15/20   Heart murmur    adolescent rheumatic fever - developed murmur    Hyperlipidemia    Hypertension    RAD (reactive airway disease)    with RTIs   Tubular adenoma    Past Surgical History:  Procedure Laterality Date   COLONOSCOPY     colonoscopy with polypectomy  2013   Dr Noe Gens, [place]   COLONOSCOPY WITH PROPOFOL N/A 07/01/2019   Procedure: COLONOSCOPY WITH PROPOFOL;  Surgeon: Lemar Lofty., MD;  Location: [facility];  Service: Gastroenterology;  Laterality: N/A;   COLONOSCOPY WITH PROPOFOL N/A 05/30/2020   Procedure: COLONOSCOPY WITH PROPOFOL;  Surgeon: Meridee Score Netty Starring., MD;  Location: [facility];  Service: Gastroenterology;  Laterality: N/A;   ENDARTERECTOMY Right 04/11/2020   Procedure: Right Carotid Endarterectomy;  Surgeon: Sherren Kerns, MD;  Location: [facility] OR;  Service: Vascular;  Laterality: Right;   ENDOSCOPIC MUCOSAL RESECTION N/A 07/01/2019   Procedure: ENDOSCOPIC MUCOSAL RESECTION;  Surgeon: Meridee Score Netty Starring., MD;  Location: [facility];  Service: Gastroenterology;  Laterality: N/A;   ENDOSCOPIC MUCOSAL RESECTION N/A 05/30/2020   Procedure: ENDOSCOPIC MUCOSAL RESECTION;  Surgeon: Meridee Score Netty Starring., MD;  Location: [facility];  Service: Gastroenterology;  Laterality: N/A;   HEMOSTASIS CLIP PLACEMENT   07/01/2019   Procedure: HEMOSTASIS CLIP PLACEMENT;  Surgeon: Lemar Lofty., MD;  Location: [facility];  Service: Gastroenterology;;   POLYPECTOMY     POLYPECTOMY  07/01/2019   Procedure: POLYPECTOMY;  Surgeon: Lemar Lofty., MD;  Location: [facility];  Service: Gastroenterology;;   POLYPECTOMY  05/30/2020   Procedure: POLYPECTOMY;  Surgeon: Lemar Lofty., MD;  Location: [facility];  Service: Gastroenterology;;   Sunnie Nielsen LIFTING INJECTION  07/01/2019   Procedure: SUBMUCOSAL LIFTING INJECTION;  Surgeon: Lemar Lofty., MD;  Location: [facility];  Service: Gastroenterology;;   SUBMUCOSAL TATTOO INJECTION  07/01/2019   Procedure: SUBMUCOSAL TATTOO INJECTION;  Surgeon: Lemar Lofty., MD;  Location: [facility];  Service: Gastroenterology;;   WISDOM TOOTH EXTRACTION     Patient Active Problem List   Diagnosis Date Noted   Fatigue 10/08/2022   Syncope 05/24/2022   Postnasal drip 05/23/2022   Sinus infection 03/19/2022   COVID 11/13/2021   Nuclear sclerotic cataract of both eyes 11/08/2020   Right shoulder pain 06/27/2020   S/P carotid endarterectomy, right 04/11/2020   Serrated adenoma of colon 03/12/2020   Aortic atherosclerosis (HCC) 02/03/2020   Stenosis of right carotid artery 01/26/2020   Branch retinal artery occlusion, right eye 01/19/2020   Posterior vitreous detachment of both eyes 01/19/2020   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) 08/31/2019   Tear of MCL (medial collateral ligament) of knee, left, initial encounter 08/05/2017  Left inguinal hernia 07/04/2017   Allergic rhinitis 12/28/2015   H/O: rheumatic fever 12/28/2015   Prediabetes 12/27/2015   Rising PSA level 02/21/2014   History of colonic polyps 02/17/2014   BPH with obstruction/lower urinary tract symptoms 05/04/2010   Hyperlipidemia 01/07/2009   Essential hypertension 09/26/2006    PCP: Cheryll Cockayne  REFERRING PROVIDER: Richardean Sale  REFERRING DIAG:  [phone] (ICD-10-CM) - Acute pain of left knee  S82.045D (ICD-10-CM) - Closed nondisplaced comminuted fracture of left patella with routine healing, subsequent encounter    THERAPY DIAG:  Muscle weakness (generalized)  Stiffness of left knee, not elsewhere classified  Acute pain of left knee  Unsteadiness on feet  Rationale for Evaluation and Treatment: Rehabilitation  ONSET DATE: 05/19/22  SUBJECTIVE:   SUBJECTIVE STATEMENT: Knee feels good, but I have noticed that my knee does buckle especially if I put all my weight on it.   PERTINENT HISTORY: 05/19/2022 ED Patient presents with his son who assists with the history. Patient has had multiple episodes of prodromal syncope, has been seen by his physician, has had adjustments of his antihypertensives. His most recent episode was last month, resulting in a fall in which he broke his left knee. Today he notes he did not have a typical amount of food intake, had an episode of lightheadedness while upright, fell, no chest pain before or after the event. Since the event has had pain in his head, neck, soreness in his jaw. No confusion, disorientation, no broken teeth. Son notes the patient is interacting in a typical manner currently.    05/22/2022 Patient states knee feels great, had a syncope episode was standing for a longtime , broken nose, decreased ROM neck , thinks the falling could be a result in changes in medicine   PAIN:  Are you having pain? No  PRECAUTIONS: Fall  WEIGHT BEARING RESTRICTIONS: Yes WBAT  FALLS:  Has patient fallen in last 6 months? none  LIVING ENVIRONMENT: Lives with: lives with their spouse Lives in: House/apartment Stairs: No Has following equipment at home: Single point cane, Environmental consultant - 2 wheeled, and Crutches  OCCUPATION: Semi-retired, run a Patent examiner business, sells insurance, and plays in a band  PLOF: Independent  PATIENT GOALS: helping the knee heal, increase  strength to support the knee  NEXT MD VISIT: none scheduled  OBJECTIVE:   DIAGNOSTIC FINDINGS:  IMPRESSION: 1. No evidence of acute fracture or dislocation 2. Small suprapatellar joint effusion. 3. Remote patellar fracture.   Closed nondisplaced comminuted fracture of left patella with routine healing, subsequent encounter -Subacute, improving, complicated, subsequent visit - Interval healing of comminuted fracture of left patella based on HPI, physical exam, x-ray images from ER visit on 05/19/2022 after repeat fall - Patient is 6-7 weeks out from fall which caused comminuted left patella fracture.  He has no tenderness to palpation of patella and no pain with weightbearing with knee and hinged knee brace - May advance to weightbearing as tolerated.  Recommend continuing to use hinged knee brace   COGNITION: Overall cognitive status: Within functional limits for tasks assessed     SENSATION: WFL  EDEMA:  none POSTURE: rounded shoulders and flexed trunk   PALPATION: Mild tenderness in the left medial knee and the left distal ITB LOWER EXTREMITY ROM:  Active ROM Right eval Left eval  Hip flexion    Hip extension    Hip abduction    Hip adduction    Hip internal rotation    Hip external rotation  Knee flexion  120  Knee extension  5  Ankle dorsiflexion    Ankle plantarflexion    Ankle inversion    Ankle eversion     (Blank rows = not tested)  LOWER EXTREMITY MMT:  MMT Right eval Left eval  Hip flexion  4  Hip extension    Hip abduction    Hip adduction    Hip internal rotation    Hip external rotation    Knee flexion  4  Knee extension  4  Ankle dorsiflexion    Ankle plantarflexion    Ankle inversion    Ankle eversion     (Blank rows = not tested)  FUNCTIONAL TESTS:  5 times sit to stand: 18 seconds some fatigue and pain in the left knee and mms around the knee Timed up and go (TUG): 12s Standing SLS:  left leg very unsteady and a lot of  extraneous movement to stay balanced but could only do 9 seconds  Left circumferential measurements 5" above the superior patella 45cm, right 46 cm  Ligamentous tests:  negative ACL, PCL, MCL, had some laxity of the left LCL GAIT: Distance walked: 100 feet Assistive device utilized: None Level of assistance: independent Comments: seems to have some decreased coordination  TODAY'S TREATMENT:                                                                                                                              DATE:  01/07/23 Bike L4 x62mins  Step ups 6" Lateral step ups 6" LLE LAQ 5# 2x10 LLE HS curls 15# 2x10 Leg press, single leg 20# 2x10 Resisted side steps 30# x5 each way  S2S holding 5# weight 2x10   12/31/22 NuStep L 4 x 6 min Leg press 40lb 2x10 Lateral 4in step ups  6in step ups x 10 each 30lb resisted gait 4 way x 3 each LLE LAQ 2x12 LLE HS curls green 2x12   12/26/22 NuStep L 4 x 6 min LAQ LLE 3lb 2x12 HS curls green 2x10  S2S from elevated mat holding yellow ball 2x12 6in step ups x10 each Hip add ball squeeze 2x10 4in lateral step ups  PATIENT EDUCATION:  Education details: POC and HEP Person educated: Patient Education method: Explanation Education comprehension: verbalized understanding  HOME EXERCISE PROGRAM: 4" side step ups  ASSESSMENT:  CLINICAL IMPRESSION: Patient is a 72 y.o. male who was seen today for physical therapy treatment for L knee pain.  He has L knee pain and discomfort when doing weight bearing activities putting pressure on the knee and steps. Also reports some discomfort with single leg extensions, but it got better the second set. He pinpoints the pain to deep underneath the patella medially and laterally as well as along the lateral quad. Worked mostly on LLE isolated strengthening. He reports the strengthening seemed to help work out the discomfort in his knee. Patient will benefit from PT to address his gait abnormalities,  work on his L knee ROM and strength to be able to return to PLOF.  OBJECTIVE IMPAIRMENTS: decreased mobility, difficulty walking, and decreased ROM.   REHAB POTENTIAL: Good  CLINICAL DECISION MAKING: Stable/uncomplicated  EVALUATION COMPLEXITY: Low   GOALS: Goals reviewed with patient? Yes  SHORT TERM GOALS: Target date: 01/04/23  Patient will be independent with initial HEP. Goal status: Progressing 12/31/22  LONG TERM GOALS: Target date: 03/20/23  Patient will be independent with advanced/ongoing HEP to improve outcomes and carryover.  Goal status: INITIAL  2.  Patient will demonstrate improved L knee AROM to >/= 0-125 deg to allow for normal gait and stair mechanics. Goal status: INITIAL  3.  Patient will demonstrate improved functional LE strength as demonstrated by 5xSTS < 12s without UE support. Goal status: INITIAL  4.  Patient will be able to go up and downs stairs step over step without difficulty or pain  Goal status: INITIAL  5. Increase left knee quad strength to 4+/5 Goal status: INITIAL   PLAN:  PT FREQUENCY: 1x/week  PT DURATION: 12 weeks  PLANNED INTERVENTIONS: Therapeutic exercises, Therapeutic activity, Neuromuscular re-education, Balance training, Gait training, Patient/Family education, Self Care, Joint mobilization, Stair training, Dry Needling, Electrical stimulation, Cryotherapy, Moist heat, Vasopneumatic device, and Manual therapy  PLAN FOR NEXT SESSION: work on quad strength and balance/coordination   Cassie Freer, PT 01/07/2023, 2:45 PM

## 2023-01-07 ENCOUNTER — Ambulatory Visit: Payer: Medicare HMO | Attending: Sports Medicine

## 2023-01-07 DIAGNOSIS — R972 Elevated prostate specific antigen [PSA]: Secondary | ICD-10-CM | POA: Diagnosis not present

## 2023-01-07 DIAGNOSIS — N401 Enlarged prostate with lower urinary tract symptoms: Secondary | ICD-10-CM | POA: Diagnosis not present

## 2023-01-07 DIAGNOSIS — M25562 Pain in left knee: Secondary | ICD-10-CM | POA: Insufficient documentation

## 2023-01-07 DIAGNOSIS — M25662 Stiffness of left knee, not elsewhere classified: Secondary | ICD-10-CM | POA: Diagnosis not present

## 2023-01-07 DIAGNOSIS — R2681 Unsteadiness on feet: Secondary | ICD-10-CM | POA: Diagnosis not present

## 2023-01-07 DIAGNOSIS — R3912 Poor urinary stream: Secondary | ICD-10-CM | POA: Diagnosis not present

## 2023-01-07 DIAGNOSIS — M6281 Muscle weakness (generalized): Secondary | ICD-10-CM | POA: Insufficient documentation

## 2023-01-11 ENCOUNTER — Other Ambulatory Visit: Payer: Self-pay

## 2023-01-11 DIAGNOSIS — I6523 Occlusion and stenosis of bilateral carotid arteries: Secondary | ICD-10-CM

## 2023-01-14 ENCOUNTER — Encounter: Payer: Self-pay | Admitting: Physical Therapy

## 2023-01-14 ENCOUNTER — Ambulatory Visit: Payer: Medicare HMO | Admitting: Physical Therapy

## 2023-01-14 DIAGNOSIS — M25662 Stiffness of left knee, not elsewhere classified: Secondary | ICD-10-CM

## 2023-01-14 DIAGNOSIS — M25562 Pain in left knee: Secondary | ICD-10-CM | POA: Diagnosis not present

## 2023-01-14 DIAGNOSIS — R2681 Unsteadiness on feet: Secondary | ICD-10-CM | POA: Diagnosis not present

## 2023-01-14 DIAGNOSIS — M6281 Muscle weakness (generalized): Secondary | ICD-10-CM

## 2023-01-14 NOTE — Therapy (Signed)
OUTPATIENT PHYSICAL THERAPY LOWER EXTREMITY TREATMENT   Patient Name: Charles Tate MRN: [id number] DOB:Aug 15, 1950, 72 y.o., male Today's Date: 01/14/2023  END OF SESSION:  PT End of Session - 01/14/23 0844     Visit Number 5    Date for PT Re-Evaluation 03/20/23    PT Start Time 0845    PT Stop Time 0930    PT Time Calculation (min) 45 min    Activity Tolerance Patient tolerated treatment well    Behavior During Therapy [facility] for tasks assessed/performed               Past Medical History:  Diagnosis Date   Allergy    Arthritis    left hip    GERD (gastroesophageal reflux disease)    per patient, resolved. 03/15/20   Heart murmur    adolescent rheumatic fever - developed murmur    Hyperlipidemia    Hypertension    RAD (reactive airway disease)    with RTIs   Tubular adenoma    Past Surgical History:  Procedure Laterality Date   COLONOSCOPY     colonoscopy with polypectomy  2013   Dr Noe Gens, [place]   COLONOSCOPY WITH PROPOFOL N/A 07/01/2019   Procedure: COLONOSCOPY WITH PROPOFOL;  Surgeon: Lemar Lofty., MD;  Location: [facility];  Service: Gastroenterology;  Laterality: N/A;   COLONOSCOPY WITH PROPOFOL N/A 05/30/2020   Procedure: COLONOSCOPY WITH PROPOFOL;  Surgeon: Meridee Score Netty Starring., MD;  Location: [facility];  Service: Gastroenterology;  Laterality: N/A;   ENDARTERECTOMY Right 04/11/2020   Procedure: Right Carotid Endarterectomy;  Surgeon: Sherren Kerns, MD;  Location: [facility] OR;  Service: Vascular;  Laterality: Right;   ENDOSCOPIC MUCOSAL RESECTION N/A 07/01/2019   Procedure: ENDOSCOPIC MUCOSAL RESECTION;  Surgeon: Meridee Score Netty Starring., MD;  Location: [facility];  Service: Gastroenterology;  Laterality: N/A;   ENDOSCOPIC MUCOSAL RESECTION N/A 05/30/2020   Procedure: ENDOSCOPIC MUCOSAL RESECTION;  Surgeon: Meridee Score Netty Starring., MD;  Location: [facility];  Service: Gastroenterology;  Laterality: N/A;   HEMOSTASIS CLIP PLACEMENT   07/01/2019   Procedure: HEMOSTASIS CLIP PLACEMENT;  Surgeon: Lemar Lofty., MD;  Location: [facility];  Service: Gastroenterology;;   POLYPECTOMY     POLYPECTOMY  07/01/2019   Procedure: POLYPECTOMY;  Surgeon: Lemar Lofty., MD;  Location: [facility];  Service: Gastroenterology;;   POLYPECTOMY  05/30/2020   Procedure: POLYPECTOMY;  Surgeon: Lemar Lofty., MD;  Location: [facility];  Service: Gastroenterology;;   Sunnie Nielsen LIFTING INJECTION  07/01/2019   Procedure: SUBMUCOSAL LIFTING INJECTION;  Surgeon: Lemar Lofty., MD;  Location: [facility];  Service: Gastroenterology;;   SUBMUCOSAL TATTOO INJECTION  07/01/2019   Procedure: SUBMUCOSAL TATTOO INJECTION;  Surgeon: Lemar Lofty., MD;  Location: [facility];  Service: Gastroenterology;;   WISDOM TOOTH EXTRACTION     Patient Active Problem List   Diagnosis Date Noted   Fatigue 10/08/2022   Syncope 05/24/2022   Postnasal drip 05/23/2022   Sinus infection 03/19/2022   COVID 11/13/2021   Nuclear sclerotic cataract of both eyes 11/08/2020   Right shoulder pain 06/27/2020   S/P carotid endarterectomy, right 04/11/2020   Serrated adenoma of colon 03/12/2020   Aortic atherosclerosis (HCC) 02/03/2020   Stenosis of right carotid artery 01/26/2020   Branch retinal artery occlusion, right eye 01/19/2020   Posterior vitreous detachment of both eyes 01/19/2020   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) 08/31/2019   Tear of MCL (medial collateral ligament) of knee, left, initial encounter 08/05/2017  Left inguinal hernia 07/04/2017   Allergic rhinitis 12/28/2015   H/O: rheumatic fever 12/28/2015   Prediabetes 12/27/2015   Rising PSA level 02/21/2014   History of colonic polyps 02/17/2014   BPH with obstruction/lower urinary tract symptoms 05/04/2010   Hyperlipidemia 01/07/2009   Essential hypertension 09/26/2006    PCP: Cheryll Cockayne  REFERRING PROVIDER: Richardean Sale  REFERRING DIAG:  [phone] (ICD-10-CM) - Acute pain of left knee  S82.045D (ICD-10-CM) - Closed nondisplaced comminuted fracture of left patella with routine healing, subsequent encounter    THERAPY DIAG:  Muscle weakness (generalized)  Stiffness of left knee, not elsewhere classified  Acute pain of left knee  Rationale for Evaluation and Treatment: Rehabilitation  ONSET DATE: 05/19/22  SUBJECTIVE:   SUBJECTIVE STATEMENT: Wobbly today, woke up in the middle of the night with vertigo. Knee feels good  PERTINENT HISTORY: 05/19/2022 ED Patient presents with his son who assists with the history. Patient has had multiple episodes of prodromal syncope, has been seen by his physician, has had adjustments of his antihypertensives. His most recent episode was last month, resulting in a fall in which he broke his left knee. Today he notes he did not have a typical amount of food intake, had an episode of lightheadedness while upright, fell, no chest pain before or after the event. Since the event has had pain in his head, neck, soreness in his jaw. No confusion, disorientation, no broken teeth. Son notes the patient is interacting in a typical manner currently.    05/22/2022 Patient states knee feels great, had a syncope episode was standing for a longtime , broken nose, decreased ROM neck , thinks the falling could be a result in changes in medicine   PAIN:  Are you having pain? No  PRECAUTIONS: Fall  WEIGHT BEARING RESTRICTIONS: Yes WBAT  FALLS:  Has patient fallen in last 6 months? none  LIVING ENVIRONMENT: Lives with: lives with their spouse Lives in: House/apartment Stairs: No Has following equipment at home: Single point cane, Environmental consultant - 2 wheeled, and Crutches  OCCUPATION: Semi-retired, run a Patent examiner business, sells insurance, and plays in a band  PLOF: Independent  PATIENT GOALS: helping the knee heal, increase strength to support the knee  NEXT MD VISIT:  none scheduled  OBJECTIVE:   DIAGNOSTIC FINDINGS:  IMPRESSION: 1. No evidence of acute fracture or dislocation 2. Small suprapatellar joint effusion. 3. Remote patellar fracture.   Closed nondisplaced comminuted fracture of left patella with routine healing, subsequent encounter -Subacute, improving, complicated, subsequent visit - Interval healing of comminuted fracture of left patella based on HPI, physical exam, x-ray images from ER visit on 05/19/2022 after repeat fall - Patient is 6-7 weeks out from fall which caused comminuted left patella fracture.  He has no tenderness to palpation of patella and no pain with weightbearing with knee and hinged knee brace - May advance to weightbearing as tolerated.  Recommend continuing to use hinged knee brace   COGNITION: Overall cognitive status: Within functional limits for tasks assessed     SENSATION: WFL  EDEMA:  none POSTURE: rounded shoulders and flexed trunk   PALPATION: Mild tenderness in the left medial knee and the left distal ITB LOWER EXTREMITY ROM:  Active ROM Left eval Left 01/14/23  Hip flexion    Hip extension    Hip abduction    Hip adduction    Hip internal rotation    Hip external rotation    Knee flexion 120 135  Knee extension 5  3  Ankle dorsiflexion    Ankle plantarflexion    Ankle inversion    Ankle eversion     (Blank rows = not tested)  LOWER EXTREMITY MMT:  MMT Left eval Left 01/14/23  Hip flexion 4 5  Hip extension    Hip abduction    Hip adduction    Hip internal rotation    Hip external rotation    Knee flexion 4 5  Knee extension 4 5  Ankle dorsiflexion    Ankle plantarflexion    Ankle inversion    Ankle eversion     (Blank rows = not tested)  FUNCTIONAL TESTS:  5 times sit to stand: 18 seconds some fatigue and pain in the left knee and mms around the knee Timed up and go (TUG): 12s Standing SLS:  left leg very unsteady and a lot of extraneous movement to stay balanced but  could only do 9 seconds  Left circumferential measurements 5" above the superior patella 45cm, right 46 cm  Ligamentous tests:  negative ACL, PCL, MCL, had some laxity of the left LCL GAIT: Distance walked: 100 feet Assistive device utilized: None Level of assistance: independent Comments: seems to have some decreased coordination  TODAY'S TREATMENT:                                                                                                                              DATE:  01/14/23 NuStep L5 x 5 min Bike L 4 x 3 min Checked Goals S2S holding blue ball 2x10 Leg press 50lb 2x12, LLE 30lb 2x10 LLE HS curls 20# 2x12 LLE Ext  5# 2x10   01/07/23 Bike L4 x67mins  Step ups 6" Lateral step ups 6" LLE LAQ 5# 2x10 LLE HS curls 15# 2x10 Leg press, single leg 20# 2x10 Resisted side steps 30# x5 each way  S2S holding 5# weight 2x10   12/31/22 NuStep L 4 x 6 min Leg press 40lb 2x10 Lateral 4in step ups  6in step ups x 10 each 30lb resisted gait 4 way x 3 each LLE LAQ 2x12 LLE HS curls green 2x12   12/26/22 NuStep L 4 x 6 min LAQ LLE 3lb 2x12 HS curls green 2x10  S2S from elevated mat holding yellow ball 2x12 6in step ups x10 each Hip add ball squeeze 2x10 4in lateral step ups  PATIENT EDUCATION:  Education details: POC and HEP Person educated: Patient Education method: Explanation Education comprehension: verbalized understanding  HOME EXERCISE PROGRAM: 4" side step ups  ASSESSMENT:  CLINICAL IMPRESSION: Patient is a 71 y.o. male who was seen today for physical therapy treatment for L knee pain.  He has progressed meeting most of his goals. Attempted eccentric lateral step downs but stopped due to pai and weakness. Pt voiced confidence that he could continue on his own at the "[facility]". He reports no functional limitations and is pleased with his functional status.  OBJECTIVE IMPAIRMENTS: decreased mobility, difficulty walking, and  decreased ROM.   REHAB  POTENTIAL: Good  CLINICAL DECISION MAKING: Stable/uncomplicated  EVALUATION COMPLEXITY: Low   GOALS: Goals reviewed with patient? Yes  SHORT TERM GOALS: Target date: 01/04/23  Patient will be independent with initial HEP. Goal status: Met 01/14/23  LONG TERM GOALS: Target date: 03/20/23  Patient will be independent with advanced/ongoing HEP to improve outcomes and carryover.  Goal status: Will start going to the "[facility]"  2.  Patient will demonstrate improved L knee AROM to >/= 0-125 deg to allow for normal gait and stair mechanics. Goal status: Partly Met 3-135 01/14/23  3.  Patient will demonstrate improved functional LE strength as demonstrated by 5xSTS < 12s without UE support. Goal status: Met 8.17 sec 01/14/23  4.  Patient will be able to go up and downs stairs step over step without difficulty or pain  Goal status: Partly met "no pain, I can feel it" 01/14/23   5. Increase left knee quad strength to 4+/5 Goal status: Met 01/14/23   PLAN:  PT FREQUENCY: 1x/week  PT DURATION: 12 weeks  PLANNED INTERVENTIONS: Therapeutic exercises, Therapeutic activity, Neuromuscular re-education, Balance training, Gait training, Patient/Family education, Self Care, Joint mobilization, Stair training, Dry Needling, Electrical stimulation, Cryotherapy, Moist heat, Vasopneumatic device, and Manual therapy  PLAN FOR NEXT SESSION: D/C PT  PHYSICAL THERAPY DISCHARGE SUMMARY  Visits from Start of Care: 5 Patient agrees to discharge. Patient goals were partially met. Patient is being discharged due to being pleased with the current functional level.   Cassie Freer, DPT Grayce Sessions, PTA 01/14/2023, 8:45 AM

## 2023-01-25 ENCOUNTER — Ambulatory Visit (HOSPITAL_COMMUNITY)
Admission: RE | Admit: 2023-01-25 | Discharge: 2023-01-25 | Disposition: A | Discharge status: Home / Self Care | Payer: Medicare HMO | Source: Ambulatory Visit | Attending: Vascular Surgery

## 2023-01-25 ENCOUNTER — Ambulatory Visit: Payer: Medicare HMO | Admitting: Physician Assistant

## 2023-01-25 VITALS — BP 156/79 | HR 58 | Temp 98.3°F | Ht 74.0 in | Wt 195.9 lb

## 2023-01-25 DIAGNOSIS — I6523 Occlusion and stenosis of bilateral carotid arteries: Secondary | ICD-10-CM | POA: Diagnosis not present

## 2023-01-25 NOTE — Progress Notes (Unsigned)
Office Note   History of Present Illness   Charles Tate is a 73 y.o. (1950/06/27) male who presents for surveillance of carotid artery stenosis.   The patient returns today for follow up. He/She denies any recent CVA or TIA diagnosis. The patient also denies any recent strokelike symptoms such as slurred speech, facial droop, sudden visual changes, or sudden weakness/numbness.  No longer taking telmisartan or hydrochlorothiazide. Cardiologist to settle out meds  Current Outpatient Medications  Medication Sig Dispense Refill   aspirin EC 81 MG tablet Take 81 mg by mouth daily. Swallow whole.     atorvastatin (LIPITOR) 40 MG tablet TAKE 1 TABLET BY MOUTH EVERY DAY 90 tablet 3   azelastine (ASTELIN) 0.1 % nasal spray Place 1 spray into both nostrils 2 (two) times daily. Use in each nostril as directed 30 mL 12   Colostrum 500 MG CAPS Take 500 mg by mouth daily.     fluticasone (FLONASE) 50 MCG/ACT nasal spray Place 2 sprays into both nostrils daily. 16 mL 6   Misc Natural Products (SUPER GREENS PO) Take 1 Scoop by mouth daily.      telmisartan (MICARDIS) 80 MG tablet TAKE 1 TABLET BY MOUTH EVERY DAY 90 tablet 3   amoxicillin-clavulanate (AUGMENTIN) 875-125 MG tablet Take 1 tablet by mouth every 12 (twelve) hours. (Patient not taking: Reported on 01/25/2023) 14 tablet 0   No current facility-administered medications for this visit.    ***REVIEW OF SYSTEMS (negative unless checked):   Cardiac:  []  Chest pain or chest pressure? []  Shortness of breath upon activity? []  Shortness of breath when lying flat? []  Irregular heart rhythm?  Vascular:  []  Pain in calf, thigh, or hip brought on by walking? []  Pain in feet at night that wakes you up from your sleep? []  Blood clot in your veins? []  Leg swelling?  Pulmonary:  []  Oxygen at home? []  Productive cough? []  Wheezing?  Neurologic:  []  Sudden weakness in arms or legs? []  Sudden numbness in arms or legs? []  Sudden onset of  difficult speaking or slurred speech? []  Temporary loss of vision in one eye? []  Problems with dizziness?  Gastrointestinal:  []  Blood in stool? []  Vomited blood?  Genitourinary:  []  Burning when urinating? []  Blood in urine?  Psychiatric:  []  Major depression  Hematologic:  []  Bleeding problems? []  Problems with blood clotting?  Dermatologic:  []  Rashes or ulcers?  Constitutional:  []  Fever or chills?  Ear/Nose/Throat:  []  Change in hearing? []  Nose bleeds? []  Sore throat?  Musculoskeletal:  []  Back pain? []  Joint pain? []  Muscle pain?   Physical Examination  *** Vitals:   01/25/23 0926 01/25/23 0928  BP: (!) 164/83 (!) 156/79  Pulse: (!) 58   Temp: 98.3 F (36.8 C)   TempSrc: Temporal   SpO2: 98%   Weight: 195 lb 14.4 oz (88.9 kg)   Height: 6\' 2"  (1.88 m)    ***Body mass index is 25.15 kg/m.  General:  WDWN in NAD; vital signs documented above Gait: Not observed HENT: WNL, normocephalic Pulmonary: normal non-labored breathing , without rales, rhonchi,  wheezing Cardiac: {Desc; regular/irreg:14544} HR, without murmurs {With/Without:20273} carotid bruit*** Abdomen: soft, NT, no masses Skin: {With/Without:20273} rashes Vascular Exam/Pulses: palpable radial pulses bilaterally Extremities: {With/Without:20273} ischemic changes, {With/Without:20273} gangrene , {With/Without:20273} cellulitis; {With/Without:20273} open wounds;  Musculoskeletal: no muscle wasting or atrophy  Neurologic: A&O X 3;  No focal weakness or paresthesias are detected Psychiatric:  The pt has {Desc; normal/abnormal:11317::"Normal"} affect.  Non-Invasive Vascular Imaging   Bilateral Carotid Duplex (***):  R ICA stenosis:  {Stenosis:19197::"Occluded","80-99%","60-79%","40-59%","1-39%"} R VA: *** patent and antegrade L ICA stenosis:  {Stenosis:19197::"Occluded","80-99%","60-79%","40-59%","1-39%"} L VA: *** patent and antegrade   Medical Decision Making   Charles Tate is a  72 y.o. male who presents for surveillance of carotid artery stenosis  Based on the patient's vascular studies, their carotid artery stenosis is *** She/he denies any strokelike symptoms such as slurred speech, facial droop, sudden visual changes, or sudden weakness/numbness. No recent diagnosis of CVA or TIA. She/he has no neuro deficits on exam. There are palpable and equal radial pulses bilaterally They will continue their *** and follow up with our office in *** months/year with carotid duplex   Loel Dubonnet PA-C [facility] Office: [phone]  Clinic MD: ***

## 2023-01-29 ENCOUNTER — Other Ambulatory Visit: Payer: Self-pay

## 2023-01-29 DIAGNOSIS — I6523 Occlusion and stenosis of bilateral carotid arteries: Secondary | ICD-10-CM

## 2023-02-11 ENCOUNTER — Encounter: Payer: Self-pay | Admitting: Cardiovascular Disease

## 2023-02-11 ENCOUNTER — Ambulatory Visit: Payer: Medicare HMO | Attending: Cardiovascular Disease | Admitting: Cardiovascular Disease

## 2023-02-11 VITALS — BP 126/72 | HR 76 | Ht 75.0 in | Wt 190.6 lb

## 2023-02-11 DIAGNOSIS — I10 Essential (primary) hypertension: Secondary | ICD-10-CM

## 2023-02-11 DIAGNOSIS — E782 Mixed hyperlipidemia: Secondary | ICD-10-CM | POA: Diagnosis not present

## 2023-02-11 DIAGNOSIS — R931 Abnormal findings on diagnostic imaging of heart and coronary circulation: Secondary | ICD-10-CM | POA: Diagnosis not present

## 2023-02-11 DIAGNOSIS — I6521 Occlusion and stenosis of right carotid artery: Secondary | ICD-10-CM

## 2023-02-11 NOTE — Patient Instructions (Signed)

## 2023-02-11 NOTE — Assessment & Plan Note (Signed)
History of essential hypertension blood pressure measured today at 126/72.  He is on Micardis and hydrochlorothiazide.

## 2023-02-11 NOTE — Progress Notes (Signed)
02/11/2023 Charles Tate   26-Oct-1950  [id number]  Primary Physician Lawerance Bach, Bobette Mo, MD Primary Cardiologist: Runell Gess MD Nicholes Calamity, [place]  HPI:  Charles Tate is a 72 y.o. thin-appearing married Caucasian male, father of 2, grandfather of 4 grandchildren who works cleaning carpets and was referred by Dr. Julienne Kass to be established my practice and after his recent cardiologist, Dr. Katrinka Blazing, retired.  He has a history of essential hypertension and hyperlipidemia.  His father died at age 41 of a myocardial infarction.  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 Leonette Most fields with recent carotid Doppler study performed 01/25/2023 that revealed a widely patent endarterectomy site.  He had a coronary calcium score of 55 performed 08/28/2019.  He is very active and is completely asymptomatic.   Current Meds  Medication Sig   aspirin EC 81 MG tablet Take 81 mg by mouth daily. Swallow whole.   atorvastatin (LIPITOR) 40 MG tablet TAKE 1 TABLET BY MOUTH EVERY DAY   azelastine (ASTELIN) 0.1 % nasal spray Place 1 spray into both nostrils 2 (two) times daily. Use in each nostril as directed   Colostrum 500 MG CAPS Take 500 mg by mouth daily.   fluticasone (FLONASE) 50 MCG/ACT nasal spray Place 2 sprays into both nostrils daily.   hydrochlorothiazide (HYDRODIURIL) 25 MG tablet Take 25 mg by mouth daily.   Misc Natural Products (SUPER GREENS PO) Take 1 Scoop by mouth daily.    telmisartan (MICARDIS) 80 MG tablet TAKE 1 TABLET BY MOUTH EVERY DAY     Allergies  Allergen Reactions   Ciprofloxacin Hcl     Numbness, joint pain   Cefdinir Diarrhea    Fever, chills, stomach cramps    Tamsulosin Other (See Comments)    Lightheadedness, sinus issues   Lisinopril Cough    Social History   Socioeconomic History   Marital status: Married    Spouse name: Elease Hashimoto   Number of children: 2   Years  of education: Not on file   Highest education level: Some college, no degree  Occupational History   Occupation: self employed   Tobacco Use   Smoking status: Former   Smokeless tobacco: Never   Tobacco comments:    intermittent, short term smoker as a teen. Some second hand smoke in 60s & 30s  Vaping Use   Vaping status: Never Used  Substance and Sexual Activity   Alcohol use: Not Currently   Drug use: No   Sexual activity: Not on file  Other Topics Concern   Not on file  Social History Narrative   Exercise: none regularly   Social Determinants of Health   Financial Resource Strain: Low Risk  (10/05/2022)   Overall Financial Resource Strain (CARDIA)    Difficulty of Paying Living Expenses: Not hard at all  Food Insecurity: No Food Insecurity (10/05/2022)   Hunger Vital Sign    Worried About Running Out of Food in the Last Year: Never true    Ran Out of Food in the Last Year: Never true  Transportation Needs: No Transportation Needs (10/05/2022)   PRAPARE - Administrator, Civil Service (Medical): No    Lack of Transportation (Non-Medical): No  Physical Activity: Inactive (10/05/2022)   Exercise Vital Sign    Days of Exercise per Week: 2 days    Minutes of Exercise per Session: 0 min  Stress: No Stress Concern Present (10/05/2022)   [employer] of Occupational Health - Occupational Stress Questionnaire    Feeling of Stress : Not at all  Social Connections: Socially Integrated (10/05/2022)   Social Connection and Isolation Panel [NHANES]    Frequency of Communication with Friends and Family: More than three times a week    Frequency of Social Gatherings with Friends and Family: Three times a week    Attends Religious Services: More than 4 times per year    Active Member of Clubs or Organizations: Yes    Attends Banker Meetings: More than 4 times per year    Marital Status: Married  Catering manager Violence: Not At Risk (10/02/2022)   Humiliation,  Afraid, Rape, and Kick questionnaire    Fear of Current or Ex-Partner: No    Emotionally Abused: No    Physically Abused: No    Sexually Abused: No     Review of Systems: General: negative for chills, fever, night sweats or weight changes.  Cardiovascular: negative for chest pain, dyspnea on exertion, edema, orthopnea, palpitations, paroxysmal nocturnal dyspnea or shortness of breath Dermatological: negative for rash Respiratory: negative for cough or wheezing Urologic: negative for hematuria Abdominal: negative for nausea, vomiting, diarrhea, bright red blood per rectum, melena, or hematemesis Neurologic: negative for visual changes, syncope, or dizziness All other systems reviewed and are otherwise negative except as noted above.    Blood pressure 126/72, pulse 76, height 6\' 3"  (1.905 m), weight 190 lb 9.6 oz (86.5 kg), SpO2 95%.  General appearance: alert and no distress Neck: no adenopathy, no carotid bruit, no JVD, supple, symmetrical, trachea midline, and thyroid not enlarged, symmetric, no tenderness/mass/nodules Lungs: clear to auscultation bilaterally Heart: regular rate and rhythm, S1, S2 normal, no murmur, click, rub or gallop Extremities: extremities normal, atraumatic, no cyanosis or edema Pulses: 2+ and symmetric Skin: Skin color, texture, turgor normal. No rashes or lesions Neurologic: Grossly normal  EKG EKG Interpretation Date/Time:  Monday February 11 2023 14:29:04 EST Ventricular Rate:  64 PR Interval:  184 QRS Duration:  82 QT Interval:  392 QTC Calculation: 404 R Axis:   76  Text Interpretation: Normal sinus rhythm Normal ECG When compared with ECG of 19-May-2022 14:05, PREVIOUS ECG IS PRESENT Confirmed by Nanetta Batty [phone]) on 02/11/2023 2:29:38 PM    ASSESSMENT AND PLAN:   Hyperlipidemia History of hyperlipidemia on statin therapy with lipid profile performed 10/08/2022 revealing a total cholesterol 133, LDL 74 and HDL of 36.  Essential  hypertension History of essential hypertension blood pressure measured today at 126/72.  He is on Micardis and hydrochlorothiazide.  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  He is completely asymptomatic and is at goal with regards to his lipids for secondary prevention.  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 Darrick Penna 05/02/2019 with recent Doppler study performed 01/25/2023 that revealed a widely patent endarterectomy site.  This will be repeated on an annual basis.     Runell Gess MD FACP,FACC,FAHA, [facility] 02/11/2023 2:44 PM

## 2023-02-11 NOTE — Assessment & Plan Note (Signed)
History of hyperlipidemia on statin therapy with lipid profile performed 10/08/2022 revealing a total cholesterol 133, LDL 74 and HDL of 36.

## 2023-02-11 NOTE — Assessment & Plan Note (Signed)
Elevated coronary heart calcium score of 55 performed 08/28/2019.  He is completely asymptomatic and is at goal with regards to his lipids for secondary prevention.

## 2023-02-11 NOTE — Assessment & Plan Note (Signed)
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 Darrick Penna 05/02/2019 with recent Doppler study performed 01/25/2023 that revealed a widely patent endarterectomy site.  This will be repeated on an annual basis.

## 2023-02-14 ENCOUNTER — Encounter: Payer: Medicare HMO | Admitting: Internal Medicine

## 2023-02-28 ENCOUNTER — Encounter: Payer: Self-pay | Admitting: Internal Medicine

## 2023-02-28 ENCOUNTER — Other Ambulatory Visit: Payer: Self-pay | Admitting: Internal Medicine

## 2023-03-01 ENCOUNTER — Other Ambulatory Visit: Payer: Self-pay | Admitting: Internal Medicine

## 2023-03-01 NOTE — Telephone Encounter (Signed)
Copied from CRM [id number]. Topic: Clinical - Medication Refill >> Mar 01, 2023  1:27 PM Dimitri Ped wrote: Most Recent Primary Care Visit:  Provider: BURNS, Bobette Mo  Department: [facility]  Visit Type: OFFICE VISIT  Date: 10/08/2022  Medication: ***  Has the patient contacted their pharmacy?  (Agent: If no, request that the patient contact the pharmacy for the refill. If patient does not wish to contact the pharmacy document the reason why and proceed with request.) (Agent: If yes, when and what did the pharmacy advise?)  Is this the correct pharmacy for this prescription?  If no, delete pharmacy and type the correct one.  This is the patient's preferred pharmacy:  [id number] [place], [place] - [address] [place] [address] Phone: [phone] Fax: [phone]   Has the prescription been filled recently?   Is the patient out of the medication?   Has the patient been seen for an appointment in the last year OR does the patient have an upcoming appointment?   Can we respond through MyChart?   Agent: Please be advised that Rx refills may take up to 3 business days. We ask that you follow-up with your pharmacy.

## 2023-03-04 ENCOUNTER — Other Ambulatory Visit: Payer: Self-pay

## 2023-03-04 MED ORDER — HYDROCHLOROTHIAZIDE 25 MG PO TABS: 25.0000 mg | ORAL_TABLET | Freq: Every day | ORAL | 5 refills | Status: DC

## 2023-03-24 ENCOUNTER — Encounter: Payer: Self-pay | Admitting: Internal Medicine

## 2023-03-24 DIAGNOSIS — I2510 Atherosclerotic heart disease of native coronary artery without angina pectoris: Secondary | ICD-10-CM | POA: Insufficient documentation

## 2023-03-24 NOTE — Patient Instructions (Addendum)
Blood work was ordered.       Medications changes include :   None    A referral was ordered and someone will call you to schedule an appointment.     Return in about 6 months (around 09/22/2023) for follow up.    Health Maintenance, Male Adopting a healthy lifestyle and getting preventive care are important in promoting health and wellness. Ask your health care provider about: The right schedule for you to have regular tests and exams. Things you can do on your own to prevent diseases and keep yourself healthy. What should I know about diet, weight, and exercise? Eat a healthy diet  Eat a diet that includes plenty of vegetables, fruits, low-fat dairy products, and lean protein. Do not eat a lot of foods that are high in solid fats, added sugars, or sodium. Maintain a healthy weight Body mass index (BMI) is a measurement that can be used to identify possible weight problems. It estimates body fat based on height and weight. Your health care provider can help determine your BMI and help you achieve or maintain a healthy weight. Get regular exercise Get regular exercise. This is one of the most important things you can do for your health. Most adults should: Exercise for at least 150 minutes each week. The exercise should increase your heart rate and make you sweat (moderate-intensity exercise). Do strengthening exercises at least twice a week. This is in addition to the moderate-intensity exercise. Spend less time sitting. Even light physical activity can be beneficial. Watch cholesterol and blood lipids Have your blood tested for lipids and cholesterol at 74 years of age, then have this test every 5 years. You may need to have your cholesterol levels checked more often if: Your lipid or cholesterol levels are high. You are older than 74 years of age. You are at high risk for heart disease. What should I know about cancer screening? Many types of cancers can be detected  early and may often be prevented. Depending on your health history and family history, you may need to have cancer screening at various ages. This may include screening for: Colorectal cancer. Prostate cancer. Skin cancer. Lung cancer. What should I know about heart disease, diabetes, and high blood pressure? Blood pressure and heart disease High blood pressure causes heart disease and increases the risk of stroke. This is more likely to develop in people who have high blood pressure readings or are overweight. Talk with your health care provider about your target blood pressure readings. Have your blood pressure checked: Every 3-5 years if you are 52-36 years of age. Every year if you are 53 years old or older. If you are between the ages of 22 and 51 and are a current or former smoker, ask your health care provider if you should have a one-time screening for abdominal aortic aneurysm (AAA). Diabetes Have regular diabetes screenings. This checks your fasting blood sugar level. Have the screening done: Once every three years after age 28 if you are at a normal weight and have a low risk for diabetes. More often and at a younger age if you are overweight or have a high risk for diabetes. What should I know about preventing infection? Hepatitis B If you have a higher risk for hepatitis B, you should be screened for this virus. Talk with your health care provider to find out if you are at risk for hepatitis B infection. Hepatitis C Blood testing is recommended  for: Everyone born from [birth year] through 1966. Anyone with known risk factors for hepatitis C. Sexually transmitted infections (STIs) You should be screened each year for STIs, including gonorrhea and chlamydia, if: You are sexually active and are younger than 74 years of age. You are older than 74 years of age and your health care provider tells you that you are at risk for this type of infection. Your sexual activity has changed since  you were last screened, and you are at increased risk for chlamydia or gonorrhea. Ask your health care provider if you are at risk. Ask your health care provider about whether you are at high risk for HIV. Your health care provider may recommend a prescription medicine to help prevent HIV infection. If you choose to take medicine to prevent HIV, you should first get tested for HIV. You should then be tested every 3 months for as long as you are taking the medicine. Follow these instructions at home: Alcohol use Do not drink alcohol if your health care provider tells you not to drink. If you drink alcohol: Limit how much you have to 0-2 drinks a day. Know how much alcohol is in your drink. In the [place] one drink equals one 12 oz bottle of beer (355 mL), one 5 oz glass of wine (148 mL), or one 1 oz glass of hard liquor (44 mL). Lifestyle Do not use any products that contain nicotine or tobacco. These products include cigarettes, chewing tobacco, and vaping devices, such as e-cigarettes. If you need help quitting, ask your health care provider. Do not use street drugs. Do not share needles. Ask your health care provider for help if you need support or information about quitting drugs. General instructions Schedule regular health, dental, and eye exams. Stay current with your vaccines. Tell your health care provider if: You often feel depressed. You have ever been abused or do not feel safe at home. Summary Adopting a healthy lifestyle and getting preventive care are important in promoting health and wellness. Follow your health care provider's instructions about healthy diet, exercising, and getting tested or screened for diseases. Follow your health care provider's instructions on monitoring your cholesterol and blood pressure. This information is not intended to replace advice given to you by your health care provider. Make sure you discuss any questions you have with your health care  provider. Document Revised: 07/11/2020 Document Reviewed: 07/11/2020 Elsevier Patient Education  2024 [employer].

## 2023-03-24 NOTE — Progress Notes (Unsigned)
Subjective:    Patient ID: Charles Tate, male    DOB: 1950-04-25, 73 y.o.   MRN: [id number]     HPI Leston is here for a physical exam and his chronic medical problems.   BP at home was elevated 130's- 160's -- started back on hydrochlorothiazide - BP has been better controlled since then   Medications and allergies reviewed with patient and updated if appropriate.  Current Outpatient Medications on File Prior to Visit  Medication Sig Dispense Refill   aspirin EC 81 MG tablet Take 81 mg by mouth daily. Swallow whole.     atorvastatin (LIPITOR) 40 MG tablet TAKE 1 TABLET BY MOUTH EVERY DAY 90 tablet 3   azelastine (ASTELIN) 0.1 % nasal spray Place 1 spray into both nostrils 2 (two) times daily. Use in each nostril as directed 30 mL 12   Colostrum 500 MG CAPS Take 500 mg by mouth daily.     fluticasone (FLONASE) 50 MCG/ACT nasal spray Place 2 sprays into both nostrils daily. 16 mL 6   hydrochlorothiazide (HYDRODIURIL) 25 MG tablet Take 1 tablet (25 mg total) by mouth daily. 30 tablet 5   Misc Natural Products (SUPER GREENS PO) Take 1 Scoop by mouth daily.      telmisartan (MICARDIS) 80 MG tablet TAKE 1 TABLET BY MOUTH EVERY DAY 90 tablet 3   No current facility-administered medications on file prior to visit.    Review of Systems  Constitutional:  Negative for fever.  Eyes:  Negative for visual disturbance.  Respiratory:  Negative for cough, shortness of breath and wheezing.   Cardiovascular:  Negative for chest pain, palpitations and leg swelling.  Gastrointestinal:  Positive for constipation (occ). Negative for abdominal pain, blood in stool and diarrhea.       No gerd  Genitourinary:  Negative for difficulty urinating and dysuria.  Musculoskeletal:  Negative for arthralgias and back pain.  Skin:  Negative for rash.  Neurological:  Positive for light-headedness (occ). Negative for headaches.  Psychiatric/Behavioral:  Negative for dysphoric mood. The patient is not  nervous/anxious.        Objective:   Vitals:   03/25/23 0916  BP: 122/70  Pulse: 60  Temp: 97.8 F (36.6 C)  SpO2: 98%   Filed Weights   03/25/23 0916  Weight: 189 lb (85.7 kg)   Body mass index is 23.62 kg/m.  BP Readings from Last 3 Encounters:  03/25/23 122/70  02/11/23 126/72  01/25/23 (!) 156/79    Wt Readings from Last 3 Encounters:  03/25/23 189 lb (85.7 kg)  02/11/23 190 lb 9.6 oz (86.5 kg)  01/25/23 195 lb 14.4 oz (88.9 kg)      Physical Exam Constitutional: He appears well-developed and well-nourished. No distress.  HENT:  Head: Normocephalic and atraumatic.  Right Ear: External ear normal.  Left Ear: External ear normal.  Normal ear canals and TM b/l  Mouth/Throat: Oropharynx is clear and moist. Eyes: Conjunctivae and EOM are normal.  Neck: Neck supple. No tracheal deviation present. No thyromegaly present.  No carotid bruit  Cardiovascular: Normal rate, regular rhythm, normal heart sounds and intact distal pulses.   No murmur heard.  No lower extremity edema. Pulmonary/Chest: Effort normal and breath sounds normal. No respiratory distress. He has no wheezes. He has no rales.  Abdominal: Soft. He exhibits no distension. There is no tenderness.  Genitourinary: deferred  Lymphadenopathy:   He has no cervical adenopathy.  Skin: Skin is warm and dry. He is  not diaphoretic.  Psychiatric: He has a normal mood and affect. His behavior is normal.         Assessment & Plan:   Physical exam: Screening blood work  ordered Exercise   active - carpet cleaning Weight normal Substance abuse   none   Reviewed recommended immunizations.   Health Maintenance  Topic Date Due   INFLUENZA VACCINE  06/03/2023 (Originally 10/04/2022)   Zoster Vaccines- Shingrix (1 of 2) 06/23/2023 (Originally 07/01/1999)   DTaP/Tdap/Td (2 - Tdap) 03/24/2024 (Originally 05/03/2020)   Medicare Annual Wellness (AWV)  10/02/2023   Colonoscopy  05/30/2025   Pneumonia Vaccine 63+  Years old  Completed   Hepatitis C Screening  Completed   HPV VACCINES  Aged Out   COVID-19 Vaccine  Discontinued     See Problem List for Assessment and Plan of chronic medical problems.

## 2023-03-25 ENCOUNTER — Encounter: Payer: Self-pay | Admitting: Internal Medicine

## 2023-03-25 ENCOUNTER — Ambulatory Visit (INDEPENDENT_AMBULATORY_CARE_PROVIDER_SITE_OTHER): Payer: Medicare HMO | Admitting: Internal Medicine

## 2023-03-25 VITALS — BP 122/70 | HR 60 | Temp 97.8°F | Ht 75.0 in | Wt 189.0 lb

## 2023-03-25 DIAGNOSIS — E782 Mixed hyperlipidemia: Secondary | ICD-10-CM

## 2023-03-25 DIAGNOSIS — Z0000 Encounter for general adult medical examination without abnormal findings: Secondary | ICD-10-CM | POA: Diagnosis not present

## 2023-03-25 DIAGNOSIS — I10 Essential (primary) hypertension: Secondary | ICD-10-CM | POA: Diagnosis not present

## 2023-03-25 DIAGNOSIS — R7303 Prediabetes: Secondary | ICD-10-CM

## 2023-03-25 DIAGNOSIS — I2510 Atherosclerotic heart disease of native coronary artery without angina pectoris: Secondary | ICD-10-CM | POA: Diagnosis not present

## 2023-03-25 DIAGNOSIS — Z125 Encounter for screening for malignant neoplasm of prostate: Secondary | ICD-10-CM | POA: Diagnosis not present

## 2023-03-25 DIAGNOSIS — N138 Other obstructive and reflux uropathy: Secondary | ICD-10-CM

## 2023-03-25 DIAGNOSIS — N401 Enlarged prostate with lower urinary tract symptoms: Secondary | ICD-10-CM

## 2023-03-25 DIAGNOSIS — I700 Atherosclerosis of aorta: Secondary | ICD-10-CM | POA: Diagnosis not present

## 2023-03-25 LAB — LIPID PANEL
Cholesterol: 151 mg/dL (ref 0–200)
HDL: 45 mg/dL (ref >39.00)
LDL Cholesterol: 87 mg/dL (ref 0–99)
NonHDL: 106.17
Total CHOL/HDL Ratio: 3
Triglycerides: 94 mg/dL (ref 0.0–149.0)
VLDL: 18.8 mg/dL (ref 0.0–40.0)

## 2023-03-25 LAB — COMPREHENSIVE METABOLIC PANEL
ALT: 19 U/L (ref 0–53)
AST: 22 U/L (ref 0–37)
Albumin: 4.6 g/dL (ref 3.5–5.2)
Alkaline Phosphatase: 84 U/L (ref 39–117)
BUN: 14 mg/dL (ref 6–23)
CO2: 30 meq/L (ref 19–32)
Calcium: 9.5 mg/dL (ref 8.4–10.5)
Chloride: 101 meq/L (ref 96–112)
Creatinine, Ser: 0.75 mg/dL (ref 0.40–1.50)
GFR: 89.38 mL/min (ref >60.00)
Glucose, Bld: 99 mg/dL (ref 70–99)
Potassium: 4.6 meq/L (ref 3.5–5.1)
Sodium: 139 meq/L (ref 135–145)
Total Bilirubin: 0.8 mg/dL (ref 0.2–1.2)
Total Protein: 7.1 g/dL (ref 6.0–8.3)

## 2023-03-25 LAB — PSA, MEDICARE: PSA: 4.48 ng/mL — ABNORMAL HIGH (ref 0.10–4.00)

## 2023-03-25 LAB — CBC WITH DIFFERENTIAL/PLATELET
Basophils Absolute: 0 10*3/uL (ref 0.0–0.1)
Basophils Relative: 0.8 % (ref 0.0–3.0)
Eosinophils Absolute: 0.1 10*3/uL (ref 0.0–0.7)
Eosinophils Relative: 1.8 % (ref 0.0–5.0)
HCT: 46.1 % (ref 39.0–52.0)
Hemoglobin: 15.9 g/dL (ref 13.0–17.0)
Lymphocytes Relative: 13.2 % (ref 12.0–46.0)
Lymphs Abs: 0.6 10*3/uL — ABNORMAL LOW (ref 0.7–4.0)
MCHC: 34.4 g/dL (ref 30.0–36.0)
MCV: 90 fL (ref 78.0–100.0)
Monocytes Absolute: 0.4 10*3/uL (ref 0.1–1.0)
Monocytes Relative: 9.8 % (ref 3.0–12.0)
Neutro Abs: 3.4 10*3/uL (ref 1.4–7.7)
Neutrophils Relative %: 74.4 % (ref 43.0–77.0)
Platelets: 199 10*3/uL (ref 150.0–400.0)
RBC: 5.13 Mil/uL (ref 4.22–5.81)
RDW: 12.8 % (ref 11.5–15.5)
WBC: 4.5 10*3/uL (ref 4.0–10.5)

## 2023-03-25 LAB — TSH: TSH: 1.23 u[IU]/mL (ref 0.35–5.50)

## 2023-03-25 LAB — HEMOGLOBIN A1C: Hgb A1c MFr Bld: 6.1 % (ref 4.6–6.5)

## 2023-03-25 NOTE — Assessment & Plan Note (Addendum)
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

## 2023-03-25 NOTE — Assessment & Plan Note (Signed)
Chronic Improved w/o medication Following with Dr Liliane Shi Will check psa

## 2023-03-25 NOTE — Assessment & Plan Note (Signed)
Chronic Lab Results  Component Value Date   HGBA1C 5.8 10/08/2022   Check a1c Low sugar / carb diet Stressed regular exercise

## 2023-03-25 NOTE — Assessment & Plan Note (Signed)
Chronic Continue atorvastatin 40 mg daily Continue healthy diet, increase activity/exercise

## 2023-03-25 NOTE — Assessment & Plan Note (Signed)
Chronic Regular exercise and healthy diet encouraged Check lipid panel, CMP, TSH Continue atorvastatin 40 mg daily 

## 2023-03-25 NOTE — Assessment & Plan Note (Signed)
Chronic BP well controlled  CBC, CMP Continue telmisartan 80 mg daily, HCTZ 25 mg daily

## 2023-03-28 ENCOUNTER — Encounter: Payer: Self-pay | Admitting: Internal Medicine

## 2023-07-06 ENCOUNTER — Encounter: Payer: Self-pay | Admitting: Internal Medicine

## 2023-08-29 ENCOUNTER — Other Ambulatory Visit: Payer: Self-pay | Admitting: Internal Medicine

## 2023-08-29 DIAGNOSIS — J309 Allergic rhinitis, unspecified: Secondary | ICD-10-CM

## 2023-09-01 ENCOUNTER — Other Ambulatory Visit: Payer: Self-pay | Admitting: Internal Medicine

## 2023-10-03 ENCOUNTER — Ambulatory Visit

## 2023-10-03 VITALS — BP 102/70 | HR 62 | Ht 74.75 in | Wt 189.0 lb

## 2023-10-03 DIAGNOSIS — Z0000 Encounter for general adult medical examination without abnormal findings: Secondary | ICD-10-CM | POA: Diagnosis not present

## 2023-10-03 NOTE — Progress Notes (Signed)
 Subjective:   Charles Tate is a 73 y.o. who presents for a Medicare Wellness preventive visit.  As a reminder, Annual Wellness Visits don't include a physical exam, and some assessments may be limited, especially if this visit is performed virtually. We may recommend an in-person follow-up visit with your provider if needed.  Visit Complete: In person  Persons Participating in Visit: Patient.  AWV Questionnaire: Yes: Patient Medicare AWV questionnaire was completed by the patient on 10/03/2023; I have confirmed that all information answered by patient is correct and no changes since this date.  Cardiac Risk Factors include: hypertension;male gender;advanced age (>50men, >28 women);Other (see comment), Risk factor comments: CAD, BPH     Objective:    Today's Vitals   10/03/23 1606  BP: 102/70  Pulse: 62  SpO2: 98%  Weight: 189 lb (85.7 kg)  Height: 6' 2.75 (1.899 m)   Body mass index is 23.78 kg/m.     10/03/2023    4:10 PM 10/02/2022    9:09 AM 06/21/2022   11:04 AM 05/23/2022   11:02 AM 05/19/2022    2:06 PM 09/07/2021    8:48 AM 05/30/2020    6:38 AM  Advanced Directives  Does Patient Have a Medical Advance Directive? Yes Yes No No No No No  Type of Estate agent of [place];Living will Healthcare Power of [place];Living will       Copy of Healthcare Power of Attorney in Chart? No - copy requested No - copy requested       Would patient like information on creating a medical advance directive?      No - Patient declined     Current Medications (verified) Outpatient Encounter Medications as of 10/03/2023  Medication Sig   aspirin  EC 81 MG tablet Take 81 mg by mouth daily. Swallow whole.   atorvastatin  (LIPITOR) 40 MG tablet TAKE 1 TABLET BY MOUTH EVERY DAY   azelastine  (ASTELIN ) 0.1 % nasal spray Place 1 spray into both nostrils 2 (two) times daily. Use in each nostril as directed   Colostrum 500 MG CAPS Take 500 mg by mouth daily.   fluticasone   (FLONASE ) 50 MCG/ACT nasal spray SPRAY 2 SPRAYS INTO EACH NOSTRIL EVERY DAY   hydrochlorothiazide  (HYDRODIURIL ) 25 MG tablet TAKE 1 TABLET (25 MG TOTAL) BY MOUTH DAILY.   Misc Natural Products (SUPER GREENS PO) Take 1 Scoop by mouth daily.    telmisartan  (MICARDIS ) 80 MG tablet TAKE 1 TABLET BY MOUTH EVERY DAY   No facility-administered encounter medications on file as of 10/03/2023.    Allergies (verified) Ciprofloxacin hcl, Cefdinir , Tamsulosin, and Lisinopril   History: Past Medical History:  Diagnosis Date   Allergy    Arthritis    left hip    Carotid artery occlusion    GERD (gastroesophageal reflux disease)    per patient, resolved. 03/15/20   Heart murmur    adolescent rheumatic fever - developed murmur    Hyperlipidemia    Hypertension    RAD (reactive airway disease)    with RTIs   Tubular adenoma    Past Surgical History:  Procedure Laterality Date   COLONOSCOPY     colonoscopy with polypectomy  2013   Dr Zulema, [place]   COLONOSCOPY WITH PROPOFOL  N/A 07/01/2019   Procedure: COLONOSCOPY WITH PROPOFOL ;  Surgeon: Wilhelmenia Aloha Raddle., MD;  Location: [facility];  Service: Gastroenterology;  Laterality: N/A;   COLONOSCOPY WITH PROPOFOL  N/A 05/30/2020   Procedure: COLONOSCOPY WITH PROPOFOL ;  Surgeon: Wilhelmenia,  Aloha Raddle., MD;  Location: [facility];  Service: Gastroenterology;  Laterality: N/A;   ENDARTERECTOMY Right 04/11/2020   Procedure: Right Carotid Endarterectomy;  Surgeon: Harvey Carlin BRAVO, MD;  Location: [facility];  Service: Vascular;  Laterality: Right;   ENDOSCOPIC MUCOSAL RESECTION N/A 07/01/2019   Procedure: ENDOSCOPIC MUCOSAL RESECTION;  Surgeon: Wilhelmenia Aloha Raddle., MD;  Location: [facility];  Service: Gastroenterology;  Laterality: N/A;   ENDOSCOPIC MUCOSAL RESECTION N/A 05/30/2020   Procedure: ENDOSCOPIC MUCOSAL RESECTION;  Surgeon: Wilhelmenia Aloha Raddle., MD;  Location: [facility];  Service: Gastroenterology;  Laterality: N/A;   HEMOSTASIS  CLIP PLACEMENT  07/01/2019   Procedure: HEMOSTASIS CLIP PLACEMENT;  Surgeon: Wilhelmenia Aloha Raddle., MD;  Location: [facility];  Service: Gastroenterology;;   POLYPECTOMY     POLYPECTOMY  07/01/2019   Procedure: POLYPECTOMY;  Surgeon: Wilhelmenia Aloha Raddle., MD;  Location: [facility];  Service: Gastroenterology;;   POLYPECTOMY  05/30/2020   Procedure: POLYPECTOMY;  Surgeon: Wilhelmenia Aloha Raddle., MD;  Location: [facility];  Service: Gastroenterology;;   SUBMUCOSAL LIFTING INJECTION  07/01/2019   Procedure: SUBMUCOSAL LIFTING INJECTION;  Surgeon: Wilhelmenia Aloha Raddle., MD;  Location: [facility];  Service: Gastroenterology;;   SUBMUCOSAL TATTOO INJECTION  07/01/2019   Procedure: SUBMUCOSAL TATTOO INJECTION;  Surgeon: Wilhelmenia Aloha Raddle., MD;  Location: [facility];  Service: Gastroenterology;;   WISDOM TOOTH EXTRACTION     Family History  Problem Relation Age of Onset   Arthritis Mother    Hyperlipidemia Mother    Lupus Mother    Heart attack Father 62   Hypertension Sister    Heart attack Paternal Grandmother 23   Diabetes Paternal Aunt    Stroke Maternal Grandfather 58   Cancer Paternal Grandfather        ? stomach; in 45s   Colon cancer Paternal Grandfather    Colon polyps Neg Hx    Esophageal cancer Neg Hx    Rectal cancer Neg Hx    Stomach cancer Neg Hx    Inflammatory bowel disease Neg Hx    Liver disease Neg Hx    Pancreatic cancer Neg Hx    Social History   Socioeconomic History   Marital status: Married    Spouse name: Avelina   Number of children: 2   Years of education: Not on file   Highest education level: Some college, no degree  Occupational History   Occupation: self employed   Tobacco Use   Smoking status: Former   Smokeless tobacco: Never   Tobacco comments:    intermittent, short term smoker as a teen. Some second hand smoke in 53s & 30s  Vaping Use   Vaping status: Never Used  Substance and Sexual Activity   Alcohol use: Not  Currently   Drug use: No   Sexual activity: Not on file  Other Topics Concern   Not on file  Social History Narrative   Exercise: none regularly      Lives with wife/2025   Social Drivers of Health   Financial Resource Strain: Low Risk  (10/03/2023)   Overall Financial Resource Strain (CARDIA)    Difficulty of Paying Living Expenses: Not hard at all  Food Insecurity: No Food Insecurity (10/03/2023)   Hunger Vital Sign    Worried About Running Out of Food in the Last Year: Never true    Ran Out of Food in the Last Year: Never true  Transportation Needs: No Transportation Needs (10/03/2023)   PRAPARE - Administrator, Civil Service (Medical): No  Lack of Transportation (Non-Medical): No  Physical Activity: Sufficiently Active (10/03/2023)   Exercise Vital Sign    Days of Exercise per Week: 3 days    Minutes of Exercise per Session: 90 min  Stress: No Stress Concern Present (10/03/2023)   [employer] of Occupational Health - Occupational Stress Questionnaire    Feeling of Stress: Not at all  Social Connections: Socially Integrated (10/03/2023)   Social Connection and Isolation Panel    Frequency of Communication with Friends and Family: More than three times a week    Frequency of Social Gatherings with Friends and Family: More than three times a week    Attends Religious Services: More than 4 times per year    Active Member of [employer] or Organizations: Yes    Attends Engineer, structural: More than 4 times per year    Marital Status: Married    Tobacco Counseling Counseling given: Not Answered Tobacco comments: intermittent, short term smoker as a teen. Some second hand smoke in 20s & 30s    Clinical Intake:  Pre-visit preparation completed: Yes  Pain : No/denies pain     BMI - recorded: 23.78 Nutritional Status: BMI of 19-24  Normal Nutritional Risks: None Diabetes: No  Lab Results  Component Value Date   HGBA1C 6.1 03/25/2023   HGBA1C  5.8 10/08/2022   HGBA1C 6.1 02/12/2022     How often do you need to have someone help you when you read instructions, pamphlets, or other written materials from your doctor or pharmacy?: 1 - Never  Interpreter Needed?: No  Information entered by :: Nyzier Boivin, RMA   Activities of Daily Living     10/03/2023   10:00 AM  In your present state of health, do you have any difficulty performing the following activities:  Hearing? 0  Vision? 0  Difficulty concentrating or making decisions? 0  Walking or climbing stairs? 0  Dressing or bathing? 0  Doing errands, shopping? 0  Preparing Food and eating ? N  Using the Toilet? N  In the past six months, have you accidently leaked urine? N  Do you have problems with loss of bowel control? N  Managing your Medications? N  Managing your Finances? N  Housekeeping or managing your Housekeeping? N    Patient Care Team: Geofm Glade PARAS, MD as PCP - General (Internal Medicine) Rozella Toribio BROCKS, [facility] (Inactive) (Pharmacist) Francella Fairy SAILOR, OD as Consulting Physician (Optometry) Elner Arley LABOR, MD as Consulting Physician (Ophthalmology) Court Dorn PARAS, MD as Consulting Physician (Cardiology)  I have updated your Care Teams any recent Medical Services you may have received from other providers in the past year.     Assessment:   This is a routine wellness examination for [place].  Hearing/Vision screen Hearing Screening - Comments:: Denies hearing difficulties   Vision Screening - Comments:: Wears eyeglasses/   Goals Addressed               This Visit's Progress     COMPLETED: My goal is to get back into the gym and restart Silver Sneakers program. (pt-stated)        Just finished up with PT and will soon get back out there. Added 10/02/2022-BT  Still working on it/2025       Depression Screen     10/03/2023    4:11 PM 03/25/2023    9:24 AM 10/08/2022    9:20 AM 10/02/2022    9:13 AM 05/24/2022   10:54 AM 04/20/2022  2:06 PM 03/19/2022    8:36 AM  PHQ 2/9 Scores  PHQ - 2 Score 0 0 0 0 0 0 0  PHQ- 9 Score 0  0 0 0 2 0    Fall Risk     10/03/2023   10:00 AM 03/25/2023    9:24 AM 10/08/2022    9:19 AM 10/02/2022    9:09 AM 10/02/2022    7:58 AM  Fall Risk   Falls in the past year? 0 0 0 1 1  Number falls in past yr:  0 0 1 1  Injury with Fall?  0 0 1 1  Risk for fall due to :  No Fall Risks No Fall Risks Impaired balance/gait   Risk for fall due to: Comment    per patient- low BP   Follow up Falls evaluation completed;Falls prevention discussed Falls evaluation completed Falls evaluation completed Falls evaluation completed;Falls prevention discussed     MEDICARE RISK AT HOME:  Medicare Risk at Home Any stairs in or around the home?: (Patient-Rptd) No If so, are there any without handrails?: (Patient-Rptd) No Home free of loose throw rugs in walkways, pet beds, electrical cords, etc?: (Patient-Rptd) Yes Adequate lighting in your home to reduce risk of falls?: (Patient-Rptd) Yes Life alert?: (Patient-Rptd) No Use of a cane, walker or w/c?: (Patient-Rptd) No Grab bars in the bathroom?: (Patient-Rptd) Yes Shower chair or bench in shower?: (Patient-Rptd) Yes Elevated toilet seat or a handicapped toilet?: (Patient-Rptd) No  TIMED UP AND GO:  Was the test performed?  Yes  Length of time to ambulate 10 feet: 15 sec Gait steady and fast without use of assistive device  Cognitive Function: Declined/Normal: No cognitive concerns noted by patient or family. Patient alert, oriented, able to answer questions appropriately and recall recent events. No signs of memory loss or confusion.        10/02/2022    9:11 AM 09/07/2021    9:04 AM  6CIT Screen  What Year? 0 points 0 points  What month? 0 points 0 points  What time? 0 points 0 points  Count back from 20 0 points 0 points  Months in reverse 0 points 0 points  Repeat phrase 0 points 0 points  Total Score 0 points 0 points     Immunizations Immunization History  Administered Date(s) Administered   Fluad Quad(high Dose 65+) 11/21/2018, 02/03/2020   Influenza Whole 11/26/2007   Influenza-Unspecified 12/08/2015   PFIZER(Purple Top)SARS-COV-2 Vaccination 05/14/2019, 06/04/2019   Pneumococcal Conjugate-13 07/04/2017   Pneumococcal Polysaccharide-23 12/05/2018   Td 05/04/2010    Screening Tests Health Maintenance  Topic Date Due   Zoster Vaccines- Shingrix (1 of 2) Never done   Medicare Annual Wellness (AWV)  10/02/2023   DTaP/Tdap/Td (2 - Tdap) 03/24/2024 (Originally 05/03/2020)   INFLUENZA VACCINE  10/04/2023   Colonoscopy  05/30/2025   Pneumococcal Vaccine: 50+ Years  Completed   Hepatitis C Screening  Completed   Hepatitis B Vaccines  Aged Out   HPV VACCINES  Aged Out   Meningococcal B Vaccine  Aged Out   COVID-19 Vaccine  Discontinued    Health Maintenance  Health Maintenance Due  Topic Date Due   Zoster Vaccines- Shingrix (1 of 2) Never done   Medicare Annual Wellness (AWV)  10/02/2023   Health Maintenance Items Addressed: See Nurse Notes at the end of this note  Additional Screening:  Vision Screening: Recommended annual ophthalmology exams for early detection of glaucoma and other disorders of the eye. Would  you like a referral to an eye doctor? No    Dental Screening: Recommended annual dental exams for proper oral hygiene  Community Resource Referral / Chronic Care Management: CRR required this visit?  No   CCM required this visit?  No   Plan:    I have personally reviewed and noted the following in the patient's chart:   Medical and social history Use of alcohol, tobacco or illicit drugs  Current medications and supplements including opioid prescriptions. Patient is not currently taking opioid prescriptions. Functional ability and status Nutritional status Physical activity Advanced directives List of other physicians Hospitalizations, surgeries, and ER visits in  previous 12 months Vitals Screenings to include cognitive, depression, and falls Referrals and appointments  In addition, I have reviewed and discussed with patient certain preventive protocols, quality metrics, and best practice recommendations. A written personalized care plan for preventive services as well as general preventive health recommendations were provided to patient.   Raeleigh Guinn L Marven Veley, CMA   10/03/2023   After Visit Summary: (MyChart) Due to this being a telephonic visit, the after visit summary with patients personalized plan was offered to patient via MyChart   Notes: Patient is up to date on all health maintenance with no concerns to address today.  He would like to discuss his Bp medication with Dr. Geofm during his next office visit.  Patient states that he would like to come off of at least on of them if he could.

## 2023-10-03 NOTE — Patient Instructions (Signed)
 Mr. Mccaughan , Thank you for taking time out of your busy schedule to complete your Annual Wellness Visit with me. I enjoyed our conversation and look forward to speaking with you again next year. I, as well as your care team,  appreciate your ongoing commitment to your health goals. Please review the following plan we discussed and let me know if I can assist you in the future. Your Game plan/ To Do List    Referrals: If you haven't heard from the office you've been referred to, please reach out to them at the phone provided.   Follow up Visits: We will see or speak with you next year for your Next Medicare AWV with our clinical staff Have you seen your provider in the last 6 months (3 months if uncontrolled diabetes)? Yes  Clinician Recommendations:  Aim for 30 minutes of exercise or brisk walking, 6-8 glasses of water, and 5 servings of fruits and vegetables each day. Remember to discuss your Bp medication with Dr. Geofm during your next office visit.  Keep up the good work.      This is a list of the screenings recommended for you:  Health Maintenance  Topic Date Due   Zoster (Shingles) Vaccine (1 of 2) Never done   Medicare Annual Wellness Visit  10/02/2023   DTaP/Tdap/Td vaccine (2 - Tdap) 03/24/2024*   Flu Shot  10/04/2023   Colon Cancer Screening  05/30/2025   Pneumococcal Vaccine for age over 14  Completed   Hepatitis C Screening  Completed   Hepatitis B Vaccine  Aged Out   HPV Vaccine  Aged Out   Meningitis B Vaccine  Aged Out   COVID-19 Vaccine  Discontinued  *Topic was postponed. The date shown is not the original due date.    Advanced directives: (Copy Requested) Please bring a copy of your health care power of attorney and living will to the office to be added to your chart at your convenience. You can mail to [facility] [address] [place], [place] [address] or email to [email or web address] Advance Care Planning is important because it:  [x]   Makes sure you receive the medical care that is consistent with your values, goals, and preferences  [x]  It provides guidance to your family and loved ones and reduces their decisional burden about whether or not they are making the right decisions based on your wishes.  Follow the link provided in your after visit summary or read over the paperwork we have mailed to you to help you started getting your Advance Directives in place. If you need assistance in completing these, please reach out to us  so that we can help you!  See attachments for Preventive Care and Fall Prevention Tips.

## 2023-10-14 ENCOUNTER — Ambulatory Visit: Admitting: Internal Medicine

## 2023-10-17 ENCOUNTER — Ambulatory Visit: Payer: Self-pay

## 2023-10-17 ENCOUNTER — Encounter: Payer: Self-pay | Admitting: Internal Medicine

## 2023-10-17 NOTE — Telephone Encounter (Signed)
 FYI Only or Action Required?: FYI only for provider.  Patient was last seen in primary care on 03/25/2023 by Geofm Glade PARAS, MD.  Called Nurse Triage reporting Covid Exposure and Advice Only.  Symptoms began n/a - asymptomatic currently.  Triage Disposition: Home Care  Patient/caregiver understands and will follow disposition?: Yes      Copied from CRM [id number]. Topic: Clinical - Medical Advice >> Oct 17, 2023 11:54 AM Dedra B wrote: Reason for CRM: Pt called in saying that he has family members that have recently had covid. He and his wife keep his granddaughter. She is now testing negative. He does not have symptoms and hasn't tested positive. He wants to know if it's safe for him to keep his appt for tomorrow, 8/14 and when will it be safe for his granddaughter to come back to his house. Reason for Disposition  [1] COVID-19 EXPOSURE within last 14 days AND [2] NO symptoms  Answer Assessment - Initial Assessment Questions 1. COVID-19 EXPOSURE: Please describe how you were exposed to someone with a COVID-19 infection.     Reports family member tested positive - had previous interaction 2. PLACE of CONTACT: Where were you when you were exposed to COVID-19? (e.g., home, school, medical waiting room; which city?)     work 3. TYPE of CONTACT: How much contact was there? (e.g., sitting next to, live in same house, work in same office, same building)     Works with family member that has been exposed 4. DURATION of CONTACT: How long were you in contact with the COVID-19 patient? (e.g., a few seconds, passed by person, a few minutes, 15 minutes or longer, live with the patient)     Exposed last week 5. MASK: Were you wearing a mask? Was the other person wearing a mask? Note: wearing a mask reduces the risk of an otherwise close contact.     no 6. DATE of CONTACT: When did you have contact with a COVID-19 patient? (e.g., how many days ago)     Last week 7. COMMUNITY SPREAD:  Do you live in or have you traveled to an area where there are lots of COVID-19 cases (community spread)? (See public health department website, if unsure)       N/a 8. SYMPTOMS: Do you have any symptoms? (e.g., fever, cough, breathing difficulty, loss of taste or smell)     denies 9. VACCINE: Have you gotten the COVID-19 vaccine? If Yes, ask: Which one, how many shots, when did you get it?     3 shots series in 2021 10. PREGNANCY OR POSTPARTUM: Is there any chance you are pregnant? When was your last menstrual period? Did you deliver in the last 2 weeks?       N/a 11. HIGH RISK: Do you have any heart or lung problems? (e.g., asthma, COPD, heart failure) Do you have a weak immune system or other risk factors? (e.g., HIV positive, chemotherapy, renal failure, diabetes mellitus, sickle cell anemia, obesity)       elderly  Protocols used: Coronavirus (COVID-19) Exposure-A-AH

## 2023-10-17 NOTE — Patient Instructions (Addendum)
      Blood work was ordered.       Medications changes include :   None      Return in about 6 months (around 04/19/2024) for Physical Exam.

## 2023-10-17 NOTE — Progress Notes (Signed)
 Subjective:    Patient ID: Charles Tate, male    DOB: [birth year], 73 y.o.   MRN: [id number]     HPI Charles Tate is here for follow up of his chronic medical problems.  Stopped taking atorvastatin  a couple of months ago.  The medication was recalled.  He does feel a little less joint pain.    Working a lot with the [employer] business   Medications and allergies reviewed with patient and updated if appropriate.  Current Outpatient Medications on File Prior to Visit  Medication Sig Dispense Refill   aspirin  EC 81 MG tablet Take 81 mg by mouth daily. Swallow whole.     atorvastatin  (LIPITOR) 40 MG tablet TAKE 1 TABLET BY MOUTH EVERY DAY 90 tablet 3   azelastine  (ASTELIN ) 0.1 % nasal spray Place 1 spray into both nostrils 2 (two) times daily. Use in each nostril as directed 30 mL 12   Colostrum 500 MG CAPS Take 500 mg by mouth daily.     fluticasone  (FLONASE ) 50 MCG/ACT nasal spray SPRAY 2 SPRAYS INTO EACH NOSTRIL EVERY DAY 48 mL 2   hydrochlorothiazide  (HYDRODIURIL ) 25 MG tablet TAKE 1 TABLET (25 MG TOTAL) BY MOUTH DAILY. 30 tablet 5   Misc Natural Products (SUPER GREENS PO) Take 1 Scoop by mouth daily.      telmisartan  (MICARDIS ) 80 MG tablet TAKE 1 TABLET BY MOUTH EVERY DAY 90 tablet 3   No current facility-administered medications on file prior to visit.     Review of Systems  Constitutional:  Negative for fever.  Respiratory:  Negative for cough, shortness of breath and wheezing.   Cardiovascular:  Negative for chest pain, palpitations and leg swelling.  Neurological:  Negative for light-headedness and headaches.       Objective:   Vitals:   10/18/23 0832  BP: 128/76  Pulse: 63  Temp: 97.9 F (36.6 C)  SpO2: 98%   BP Readings from Last 3 Encounters:  10/18/23 128/76  10/03/23 102/70  03/25/23 122/70   Wt Readings from Last 3 Encounters:  10/18/23 184 lb (83.5 kg)  10/03/23 189 lb (85.7 kg)  03/25/23 189 lb (85.7 kg)   Body mass index is 23.15  kg/m.    Physical Exam Constitutional:      General: He is not in acute distress.    Appearance: Normal appearance. He is not ill-appearing.  HENT:     Head: Normocephalic and atraumatic.  Eyes:     Conjunctiva/sclera: Conjunctivae normal.  Cardiovascular:     Rate and Rhythm: Normal rate and regular rhythm.     Heart sounds: Normal heart sounds.  Pulmonary:     Effort: Pulmonary effort is normal. No respiratory distress.     Breath sounds: Normal breath sounds. No wheezing or rales.  Musculoskeletal:     Right lower leg: No edema.     Left lower leg: No edema.  Skin:    General: Skin is warm and dry.     Findings: No rash.  Neurological:     Mental Status: He is alert. Mental status is at baseline.  Psychiatric:        Mood and Affect: Mood normal.        Lab Results  Component Value Date   WBC 4.5 03/25/2023   HGB 15.9 03/25/2023   HCT 46.1 03/25/2023   PLT 199.0 03/25/2023   GLUCOSE 99 03/25/2023   CHOL 151 03/25/2023   TRIG 94.0 03/25/2023   HDL  45.00 03/25/2023   LDLDIRECT 161.0 12/05/2018   LDLCALC 87 03/25/2023   ALT 19 03/25/2023   AST 22 03/25/2023   NA 139 03/25/2023   K 4.6 03/25/2023   CL 101 03/25/2023   CREATININE 0.75 03/25/2023   BUN 14 03/25/2023   CO2 30 03/25/2023   TSH 1.23 03/25/2023   PSA 4.48 (H) 03/25/2023   INR 1.1 03/15/2020   HGBA1C 6.1 03/25/2023     Assessment & Plan:    See Problem List for Assessment and Plan of chronic medical problems.

## 2023-10-18 ENCOUNTER — Ambulatory Visit (INDEPENDENT_AMBULATORY_CARE_PROVIDER_SITE_OTHER): Admitting: Internal Medicine

## 2023-10-18 VITALS — BP 128/76 | HR 63 | Temp 97.9°F | Ht 74.75 in | Wt 184.0 lb

## 2023-10-18 DIAGNOSIS — I10 Essential (primary) hypertension: Secondary | ICD-10-CM

## 2023-10-18 DIAGNOSIS — E782 Mixed hyperlipidemia: Secondary | ICD-10-CM

## 2023-10-18 DIAGNOSIS — I2510 Atherosclerotic heart disease of native coronary artery without angina pectoris: Secondary | ICD-10-CM

## 2023-10-18 DIAGNOSIS — I6521 Occlusion and stenosis of right carotid artery: Secondary | ICD-10-CM

## 2023-10-18 DIAGNOSIS — R7303 Prediabetes: Secondary | ICD-10-CM

## 2023-10-18 LAB — COMPREHENSIVE METABOLIC PANEL WITH GFR
ALT: 16 U/L (ref 0–53)
AST: 19 U/L (ref 0–37)
Albumin: 4.4 g/dL (ref 3.5–5.2)
Alkaline Phosphatase: 67 U/L (ref 39–117)
BUN: 16 mg/dL (ref 6–23)
CO2: 31 meq/L (ref 19–32)
Calcium: 9.2 mg/dL (ref 8.4–10.5)
Chloride: 98 meq/L (ref 96–112)
Creatinine, Ser: 0.72 mg/dL (ref 0.40–1.50)
GFR: 90.13 mL/min (ref >60.00)
Glucose, Bld: 98 mg/dL (ref 70–99)
Potassium: 3.6 meq/L (ref 3.5–5.1)
Sodium: 137 meq/L (ref 135–145)
Total Bilirubin: 0.6 mg/dL (ref 0.2–1.2)
Total Protein: 7.2 g/dL (ref 6.0–8.3)

## 2023-10-18 LAB — LIPID PANEL
Cholesterol: 191 mg/dL (ref 0–200)
HDL: 44.6 mg/dL (ref >39.00)
LDL Cholesterol: 129 mg/dL — ABNORMAL HIGH (ref 0–99)
NonHDL: 146.81
Total CHOL/HDL Ratio: 4
Triglycerides: 90 mg/dL (ref 0.0–149.0)
VLDL: 18 mg/dL (ref 0.0–40.0)

## 2023-10-18 LAB — CBC WITH DIFFERENTIAL/PLATELET
Basophils Absolute: 0 K/uL (ref 0.0–0.1)
Basophils Relative: 0.9 % (ref 0.0–3.0)
Eosinophils Absolute: 0.1 K/uL (ref 0.0–0.7)
Eosinophils Relative: 1.9 % (ref 0.0–5.0)
HCT: 44.7 % (ref 39.0–52.0)
Hemoglobin: 15.2 g/dL (ref 13.0–17.0)
Lymphocytes Relative: 11.7 % — ABNORMAL LOW (ref 12.0–46.0)
Lymphs Abs: 0.5 K/uL — ABNORMAL LOW (ref 0.7–4.0)
MCHC: 33.9 g/dL (ref 30.0–36.0)
MCV: 88.7 fl (ref 78.0–100.0)
Monocytes Absolute: 0.5 K/uL (ref 0.1–1.0)
Monocytes Relative: 10 % (ref 3.0–12.0)
Neutro Abs: 3.4 K/uL (ref 1.4–7.7)
Neutrophils Relative %: 75.5 % (ref 43.0–77.0)
Platelets: 187 K/uL (ref 150.0–400.0)
RBC: 5.04 Mil/uL (ref 4.22–5.81)
RDW: 12.7 % (ref 11.5–15.5)
WBC: 4.6 K/uL (ref 4.0–10.5)

## 2023-10-18 LAB — HEMOGLOBIN A1C: Hgb A1c MFr Bld: 6 % (ref 4.6–6.5)

## 2023-10-18 NOTE — Assessment & Plan Note (Addendum)
 Chronic Following with vascular S/p CEA Stopped atorvastatin  40 mg daily  Continue aspirin  81 mg daily Encouraged healthy diet, regular exercise Check lipids-if LDL more than 70 need to adjust statin

## 2023-10-18 NOTE — Assessment & Plan Note (Signed)
 Chronic Lab Results  Component Value Date   HGBA1C 6.1 03/25/2023   Check a1c Low sugar / carb diet Stressed regular exercise

## 2023-10-18 NOTE — Assessment & Plan Note (Signed)
 Chronic BP well controlled  CBC, CMP Continue telmisartan 80 mg daily, HCTZ 25 mg daily

## 2023-10-18 NOTE — Assessment & Plan Note (Signed)
 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

## 2023-10-18 NOTE — Assessment & Plan Note (Addendum)
 Chronic Lab Results  Component Value Date   LDLCALC 87 03/25/2023    Regular exercise and healthy diet encouraged Check lipid panel, CMP stopped atorvastatin  40 mg daily due to recall  - did feel some pain in joints improved Will check blood work - stressed that he needs to be on a statin due CAD, carotid artery stenosis - can try crestor 40 mg daily or restart atorvastatin  40 mg daily Goal LDL < 70

## 2023-10-19 ENCOUNTER — Ambulatory Visit: Payer: Self-pay | Admitting: Internal Medicine

## 2023-10-22 NOTE — Telephone Encounter (Signed)
 The patient saw Dr. Geofm on 8/15.  They probably discussed this issue.

## 2023-11-14 ENCOUNTER — Other Ambulatory Visit: Payer: Self-pay

## 2023-11-14 DIAGNOSIS — Z9889 Other specified postprocedural states: Secondary | ICD-10-CM

## 2023-12-16 ENCOUNTER — Encounter: Payer: Self-pay | Admitting: Physician Assistant

## 2023-12-16 ENCOUNTER — Ambulatory Visit (HOSPITAL_COMMUNITY)
Admission: RE | Admit: 2023-12-16 | Discharge: 2023-12-16 | Disposition: A | Discharge status: Home / Self Care | Source: Ambulatory Visit | Attending: Surgery | Admitting: Surgery

## 2023-12-16 ENCOUNTER — Ambulatory Visit (INDEPENDENT_AMBULATORY_CARE_PROVIDER_SITE_OTHER): Admitting: Physician Assistant

## 2023-12-16 VITALS — BP 123/74 | HR 53 | Temp 98.3°F

## 2023-12-16 DIAGNOSIS — Z8249 Family history of ischemic heart disease and other diseases of the circulatory system: Secondary | ICD-10-CM

## 2023-12-16 DIAGNOSIS — I6523 Occlusion and stenosis of bilateral carotid arteries: Secondary | ICD-10-CM | POA: Insufficient documentation

## 2023-12-16 DIAGNOSIS — Z9889 Other specified postprocedural states: Secondary | ICD-10-CM | POA: Diagnosis not present

## 2023-12-16 NOTE — Progress Notes (Signed)
 HISTORY AND PHYSICAL     CC:  follow up. Requesting Provider:  Geofm Glade PARAS, MD  HPI: This is a 73 y.o. male here for follow up for carotid artery stenosis.  Pt is s/p right CEA for symptomatic (right retinal embolus) carotid artery stenosis on 04/11/2020 by Dr. Harvey.    Pt was last seen 01/25/2023 and at that time he was not having any neurological sx but he had passed out and fallen twice due to hypotension and his medications were adjusted by his PCP.   Pt returns today for follow up.    Pt denies any amaurosis fugax, speech difficulties, weakness, numbness, paralysis or clumsiness or facial droop.    He denies any claudication, rest pain or non healing wounds.  He has stopped taking his statin and his joint pain and muscle aches improved.  He is currently taking alternative supplements for this.   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  He states he has lost weight from around 210 and now around 180.  He feels better and is more active.    He states he has not had anymore dizzy spells or passing out.  He states that it started when he started taking flomax.  He is now currently on ARB and 1/2 dose of his hydrochlorothiazide  and this seems to be working for him.  His PCP is continuing to monitor this.    The pt is on a statin for cholesterol management.  The pt is on a daily aspirin .   Other AC:  none The pt is on ARBB, diuretic for hypertension.   The pt is not on medication for diabetes Tobacco hx:  former  Pt does have family hx of AAA with his maternal grandfather.   Past Medical History:  Diagnosis Date   Allergy    Arthritis    left hip    Carotid artery occlusion    GERD (gastroesophageal reflux disease)    per patient, resolved. 03/15/20   Heart murmur    adolescent rheumatic fever - developed murmur    Hyperlipidemia    Hypertension    RAD (reactive airway disease)    with  RTIs   Tubular adenoma     Past Surgical History:  Procedure Laterality Date   COLONOSCOPY     colonoscopy with polypectomy  2013   Dr Zulema, [place]   COLONOSCOPY WITH PROPOFOL  N/A 07/01/2019   Procedure: COLONOSCOPY WITH PROPOFOL ;  Surgeon: Wilhelmenia Aloha Raddle., MD;  Location: [facility];  Service: Gastroenterology;  Laterality: N/A;   COLONOSCOPY WITH PROPOFOL  N/A 05/30/2020   Procedure: COLONOSCOPY WITH PROPOFOL ;  Surgeon: Mansouraty, Aloha Raddle., MD;  Location: [facility];  Service: Gastroenterology;  Laterality: N/A;   ENDARTERECTOMY Right 04/11/2020   Procedure: Right Carotid Endarterectomy;  Surgeon: Harvey Carlin BRAVO, MD;  [facility];  Service: Vascular;  Laterality: Right;   ENDOSCOPIC MUCOSAL RESECTION N/A 07/01/2019   Procedure: ENDOSCOPIC MUCOSAL RESECTION;  Surgeon: Wilhelmenia Aloha Raddle., MD;  Location: [facility];  Service: Gastroenterology;  Laterality: N/A;   ENDOSCOPIC MUCOSAL RESECTION N/A 05/30/2020   Procedure: ENDOSCOPIC MUCOSAL RESECTION;  Surgeon: Wilhelmenia Aloha Raddle., MD;  Location: [facility];  Service: Gastroenterology;  Laterality: N/A;   HEMOSTASIS CLIP PLACEMENT  07/01/2019   Procedure: HEMOSTASIS CLIP PLACEMENT;  Surgeon: Wilhelmenia Aloha Raddle., MD;  Location: [facility];  Service: Gastroenterology;;   POLYPECTOMY     POLYPECTOMY  07/01/2019   Procedure: POLYPECTOMY;  Surgeon: Wilhelmenia Aloha Raddle., MD;  Location: [facility];  Service: Gastroenterology;;   POLYPECTOMY  05/30/2020   Procedure: POLYPECTOMY;  Surgeon: Wilhelmenia Aloha Raddle., MD;  Location: [facility];  Service: Gastroenterology;;   ROBLEY MEYER INJECTION  07/01/2019   Procedure: SUBMUCOSAL LIFTING INJECTION;  Surgeon: Wilhelmenia Aloha Raddle., MD;  Location: [facility];  Service: Gastroenterology;;   SUBMUCOSAL TATTOO INJECTION  07/01/2019   Procedure: SUBMUCOSAL TATTOO INJECTION;  Surgeon: Wilhelmenia Aloha Raddle., MD;  Location: [facility];  Service:  Gastroenterology;;   SHELLEE TOOTH EXTRACTION      Allergies  Allergen Reactions   Ciprofloxacin Hcl     Numbness, joint pain   Cefdinir  Diarrhea    Fever, chills, stomach cramps    Tamsulosin Other (See Comments)    Lightheadedness, sinus issues   Lisinopril Cough    Current Outpatient Medications  Medication Sig Dispense Refill   aspirin  EC 81 MG tablet Take 81 mg by mouth daily. Swallow whole.     atorvastatin  (LIPITOR) 40 MG tablet TAKE 1 TABLET BY MOUTH EVERY DAY (Patient not taking: Reported on 10/18/2023) 90 tablet 3   azelastine  (ASTELIN ) 0.1 % nasal spray Place 1 spray into both nostrils 2 (two) times daily. Use in each nostril as directed 30 mL 12   Colostrum 500 MG CAPS Take 500 mg by mouth daily.     fluticasone  (FLONASE ) 50 MCG/ACT nasal spray SPRAY 2 SPRAYS INTO EACH NOSTRIL EVERY DAY 48 mL 2   hydrochlorothiazide  (HYDRODIURIL ) 25 MG tablet TAKE 1 TABLET (25 MG TOTAL) BY MOUTH DAILY. 30 tablet 5   Misc Natural Products (SUPER GREENS PO) Take 1 Scoop by mouth daily.      telmisartan  (MICARDIS ) 80 MG tablet TAKE 1 TABLET BY MOUTH EVERY DAY 90 tablet 3   No current facility-administered medications for this visit.    Family History  Problem Relation Age of Onset   Arthritis Mother    Hyperlipidemia Mother    Lupus Mother    Heart attack Father 62   Hypertension Sister    Heart attack Paternal Grandmother 35   Diabetes Paternal Aunt    Stroke Maternal Grandfather 72   Cancer Paternal Grandfather        ? stomach; in 17s   Colon cancer Paternal Grandfather    Colon polyps Neg Hx    Esophageal cancer Neg Hx    Rectal cancer Neg Hx    Stomach cancer Neg Hx    Inflammatory bowel disease Neg Hx    Liver disease Neg Hx    Pancreatic cancer Neg Hx     Social History   Socioeconomic History   Marital status: Married    Spouse name: Avelina   Number of children: 2   Years of education: Not on file   Highest education level: Some college, no degree   Occupational History   Occupation: self employed   Tobacco Use   Smoking status: Former   Smokeless tobacco: Never   Tobacco comments:    intermittent, short term smoker as a teen. Some second hand smoke in 65s & 30s  Vaping Use   Vaping status: Never Used  Substance and Sexual Activity   Alcohol use: Not Currently   Drug use: No   Sexual activity: Not on file  Other Topics Concern   Not on file  Social History Narrative   Exercise: none regularly      Lives with wife/2025   Social Drivers of Health  Financial Resource Strain: Low Risk  (10/03/2023)   Overall Financial Resource Strain (CARDIA)    Difficulty of Paying Living Expenses: Not hard at all  Food Insecurity: No Food Insecurity (10/03/2023)   Hunger Vital Sign    Worried About Running Out of Food in the Last Year: Never true    Ran Out of Food in the Last Year: Never true  Transportation Needs: No Transportation Needs (10/03/2023)   PRAPARE - Administrator, Civil Service (Medical): No    Lack of Transportation (Non-Medical): No  Physical Activity: Sufficiently Active (10/03/2023)   Exercise Vital Sign    Days of Exercise per Week: 3 days    Minutes of Exercise per Session: 90 min  Stress: No Stress Concern Present (10/03/2023)   [employer] of Occupational Health - Occupational Stress Questionnaire    Feeling of Stress: Not at all  Social Connections: Socially Integrated (10/03/2023)   Social Connection and Isolation Panel    Frequency of Communication with Friends and Family: More than three times a week    Frequency of Social Gatherings with Friends and Family: More than three times a week    Attends Religious Services: More than 4 times per year    Active Member of [employer] or Organizations: Yes    Attends Engineer, structural: More than 4 times per year    Marital Status: Married  Catering manager Violence: Not At Risk (10/03/2023)   Humiliation, Afraid, Rape, and Kick questionnaire     Fear of Current or Ex-Partner: No    Emotionally Abused: No    Physically Abused: No    Sexually Abused: No     REVIEW OF SYSTEMS:   [X]  denotes positive finding, [ ]  denotes negative finding Cardiac  Comments:  Chest pain or chest pressure:    Shortness of breath upon exertion:    Short of breath when lying flat:    Irregular heart rhythm:        Vascular    Pain in calf, thigh, or hip brought on by ambulation:    Pain in feet at night that wakes you up from your sleep:     Blood clot in your veins:    Leg swelling:         Pulmonary    Oxygen at home:    Productive cough:     Wheezing:         Neurologic    Sudden weakness in arms or legs:     Sudden numbness in arms or legs:     Sudden onset of difficulty speaking or slurred speech:    Temporary loss of vision in one eye:     Problems with dizziness:         Gastrointestinal    Blood in stool:     Vomited blood:         Genitourinary    Burning when urinating:     Blood in urine:        Psychiatric    Major depression:         Hematologic    Bleeding problems:    Problems with blood clotting too easily:        Skin    Rashes or ulcers:        Constitutional    Fever or chills:      PHYSICAL EXAMINATION:  Today's Vitals   12/16/23 0909  BP: 123/74  Pulse: (!) 53  Temp: 98.3 F (36.8 C)  TempSrc: Temporal   There is no height or weight on file to calculate BMI.    General:  WDWN in NAD; vital signs documented above Gait: Not observed HENT: WNL, normocephalic Pulmonary: normal non-labored breathing Cardiac: regular HR, without carotid bruits Abdomen: soft, NT; aortic pulse is not palpable Skin: without rashes Vascular Exam/Pulses:  Right Left  Radial 2+ (normal) 2+ (normal)   Extremities: without open wounds Musculoskeletal: no muscle wasting or atrophy  Neurologic: A&O X 3; moving all extremities equally; speech is fluent/normal Psychiatric:  The pt has Normal  affect.   Non-Invasive Vascular Imaging:   Carotid Duplex on 12/16/2023 Right:  1-39% ICA stenosis Left:  1-39% ICA stenosis   Previous Carotid duplex on 01/25/2023: Right: 1-39% ICA stenosis Left:   1-39% ICA stenosis    ASSESSMENT/PLAN:: 73 y.o. male here for follow up carotid artery stenosis and has hx of right CEA for symptomatic (right retinal embolus) carotid artery stenosis on 04/11/2020 by Dr. Harvey.    Carotid stenosis -duplex today reveals bilateral ICA stenosis of 1-39% and he remains asymptomatic -discussed s/s of stroke with pt and he understands should he develop any of these sx, he will go to the nearest ER or call 911. -pt will f/u in one year with carotid duplex -pt will call sooner should he have any issues. -continue asa-he is taking alternative supplement for statin  Family hx AAA -he has family hx with his maternal grandfather.  His aorta is not palpable.   -will have him return in the next few weeks for AAA Medicare screening.    Lucie Apt, [facility] [phone]  Clinic MD:  [place]

## 2023-12-18 ENCOUNTER — Ambulatory Visit: Admission: EM | Admit: 2023-12-18 | Discharge: 2023-12-18 | Disposition: A | Discharge status: Home / Self Care

## 2023-12-18 ENCOUNTER — Ambulatory Visit: Admitting: Radiology

## 2023-12-18 ENCOUNTER — Encounter: Payer: Self-pay | Admitting: Emergency Medicine

## 2023-12-18 DIAGNOSIS — B349 Viral infection, unspecified: Secondary | ICD-10-CM | POA: Diagnosis not present

## 2023-12-18 DIAGNOSIS — R0989 Other specified symptoms and signs involving the circulatory and respiratory systems: Secondary | ICD-10-CM

## 2023-12-18 MED ORDER — ALBUTEROL SULFATE HFA 108 (90 BASE) MCG/ACT IN AERS: 1.0000 | INHALATION_SPRAY | Freq: Four times a day (QID) | RESPIRATORY_TRACT | 0 refills | Status: DC | PRN

## 2023-12-18 MED ORDER — PROMETHAZINE-DM 6.25-15 MG/5ML PO SYRP
10.0000 mL | ORAL_SOLUTION | Freq: Three times a day (TID) | ORAL | 0 refills | Status: AC | PRN
Start: 2023-12-18 — End: ?

## 2023-12-18 MED ORDER — PREDNISONE 20 MG PO TABS
40.0000 mg | ORAL_TABLET | Freq: Every day | ORAL | 0 refills | Status: AC
Start: 2023-12-18 — End: 2023-12-23

## 2023-12-18 NOTE — ED Provider Notes (Signed)
 [facility]  Note:  This document was prepared using Dragon voice recognition software and may include unintentional dictation errors.  MRN: [id number] DOB: 08-20-1950  Subjective:   Charles Tate is a 73 y.o. male presenting for ongoing cough, nasal congestion, chest congestion, postnasal drip x 1 week.  Patient denies any fever, shortness of breath, severe chest pain, weakness, dizziness.  Patient does state that this morning he woke up with some minor upper chest soreness with coughing.  Patient has been using previously prescribed Flonase  nasal spray and azelastine  nasal spray with minimal improvement to symptoms.  Patient was concern for possible pneumonia or other upper respiratory bacterial infection.  No current facility-administered medications for this encounter.  Current Outpatient Medications:    albuterol  (VENTOLIN  HFA) 108 (90 Base) MCG/ACT inhaler, Inhale 1-2 puffs into the lungs every 6 (six) hours as needed for wheezing or shortness of breath., Disp: 18 g, Rfl: 0   predniSONE  (DELTASONE ) 20 MG tablet, Take 2 tablets (40 mg total) by mouth daily for 5 days., Disp: 10 tablet, Rfl: 0   promethazine -dextromethorphan (PROMETHAZINE -DM) 6.25-15 MG/5ML syrup, Take 10 mLs by mouth 3 (three) times daily as needed for cough., Disp: 240 mL, Rfl: 0   aspirin  EC 81 MG tablet, Take 81 mg by mouth daily. Swallow whole., Disp: , Rfl:    atorvastatin  (LIPITOR) 40 MG tablet, TAKE 1 TABLET BY MOUTH EVERY DAY (Patient not taking: Reported on 10/18/2023), Disp: 90 tablet, Rfl: 3   azelastine  (ASTELIN ) 0.1 % nasal spray, Place 1 spray into both nostrils 2 (two) times daily. Use in each nostril as directed, Disp: 30 mL, Rfl: 12   Colostrum 500 MG CAPS, Take 500 mg by mouth daily., Disp: , Rfl:    fluticasone  (FLONASE ) 50 MCG/ACT nasal spray, SPRAY 2 SPRAYS INTO EACH NOSTRIL EVERY DAY, Disp: 48 mL, Rfl: 2   hydrochlorothiazide  (HYDRODIURIL ) 25 MG tablet, TAKE 1 TABLET (25 MG  TOTAL) BY MOUTH DAILY., Disp: 30 tablet, Rfl: 5   Misc Natural Products (SUPER GREENS PO), Take 1 Scoop by mouth daily. , Disp: , Rfl:    telmisartan  (MICARDIS ) 80 MG tablet, TAKE 1 TABLET BY MOUTH EVERY DAY, Disp: 90 tablet, Rfl: 3   Allergies  Allergen Reactions   Ciprofloxacin Hcl     Numbness, joint pain   Cefdinir  Diarrhea    Fever, chills, stomach cramps    Tamsulosin Other (See Comments)    Lightheadedness, sinus issues   Lisinopril Cough    Past Medical History:  Diagnosis Date   Allergy    Arthritis    left hip    Carotid artery occlusion    GERD (gastroesophageal reflux disease)    per patient, resolved. 03/15/20   Heart murmur    adolescent rheumatic fever - developed murmur    Hyperlipidemia    Hypertension    RAD (reactive airway disease)    with RTIs   Tubular adenoma      Past Surgical History:  Procedure Laterality Date   COLONOSCOPY     colonoscopy with polypectomy  2013   Dr Zulema, [place]   COLONOSCOPY WITH PROPOFOL  N/A 07/01/2019   Procedure: COLONOSCOPY WITH PROPOFOL ;  Surgeon: Wilhelmenia Aloha Raddle., MD;  Location: [facility];  Service: Gastroenterology;  Laterality: N/A;   COLONOSCOPY WITH PROPOFOL  N/A 05/30/2020   Procedure: COLONOSCOPY WITH PROPOFOL ;  Surgeon: Wilhelmenia Aloha Raddle., MD;  Location: [facility];  Service: Gastroenterology;  Laterality: N/A;   ENDARTERECTOMY Right 04/11/2020   Procedure: Right  Carotid Endarterectomy;  Surgeon: Harvey Carlin BRAVO, MD;  Location: [facility];  Service: Vascular;  Laterality: Right;   ENDOSCOPIC MUCOSAL RESECTION N/A 07/01/2019   Procedure: ENDOSCOPIC MUCOSAL RESECTION;  Surgeon: Wilhelmenia Aloha Raddle., MD;  Location: [facility];  Service: Gastroenterology;  Laterality: N/A;   ENDOSCOPIC MUCOSAL RESECTION N/A 05/30/2020   Procedure: ENDOSCOPIC MUCOSAL RESECTION;  Surgeon: Wilhelmenia Aloha Raddle., MD;  Location: [facility];  Service: Gastroenterology;  Laterality: N/A;   HEMOSTASIS CLIP PLACEMENT   07/01/2019   Procedure: HEMOSTASIS CLIP PLACEMENT;  Surgeon: Wilhelmenia Aloha Raddle., MD;  Location: [facility];  Service: Gastroenterology;;   POLYPECTOMY     POLYPECTOMY  07/01/2019   Procedure: POLYPECTOMY;  Surgeon: Wilhelmenia Aloha Raddle., MD;  Location: [facility];  Service: Gastroenterology;;   POLYPECTOMY  05/30/2020   Procedure: POLYPECTOMY;  Surgeon: Wilhelmenia Aloha Raddle., MD;  Location: [facility];  Service: Gastroenterology;;   SUBMUCOSAL LIFTING INJECTION  07/01/2019   Procedure: SUBMUCOSAL LIFTING INJECTION;  Surgeon: Wilhelmenia Aloha Raddle., MD;  Location: [facility];  Service: Gastroenterology;;   SUBMUCOSAL TATTOO INJECTION  07/01/2019   Procedure: SUBMUCOSAL TATTOO INJECTION;  Surgeon: Wilhelmenia Aloha Raddle., MD;  Location: [facility];  Service: Gastroenterology;;   WISDOM TOOTH EXTRACTION      Family History  Problem Relation Age of Onset   Arthritis Mother    Hyperlipidemia Mother    Lupus Mother    Heart attack Father 36   Hypertension Sister    Heart attack Paternal Grandmother 40   Diabetes Paternal Aunt    Stroke Maternal Grandfather 86   Cancer Paternal Grandfather        ? stomach; in 16s   Colon cancer Paternal Grandfather    Colon polyps Neg Hx    Esophageal cancer Neg Hx    Rectal cancer Neg Hx    Stomach cancer Neg Hx    Inflammatory bowel disease Neg Hx    Liver disease Neg Hx    Pancreatic cancer Neg Hx     Social History   Tobacco Use   Smoking status: Former   Smokeless tobacco: Never   Tobacco comments:    intermittent, short term smoker as a teen. Some second hand smoke in 40s & 30s  Vaping Use   Vaping status: Never Used  Substance Use Topics   Alcohol use: Not Currently   Drug use: No    ROS Refer to HPI for ROS details.  Objective:   Vitals: BP 108/65 (BP Location: Right Arm)   Pulse 65   Temp 98.3 F (36.8 C) (Oral)   Resp 16   SpO2 96%   Physical Exam Vitals and nursing note reviewed.  Constitutional:       General: He is not in acute distress.    Appearance: Normal appearance. He is well-developed. He is not ill-appearing or toxic-appearing.  HENT:     Head: Normocephalic.     Nose: Congestion present. No rhinorrhea.     Right Sinus: No maxillary sinus tenderness or frontal sinus tenderness.     Left Sinus: No maxillary sinus tenderness or frontal sinus tenderness.     Mouth/Throat:     Mouth: Mucous membranes are moist.     Pharynx: Oropharynx is clear. Posterior oropharyngeal erythema and postnasal drip present. No oropharyngeal exudate.  Eyes:     General:        Right eye: No discharge.        Left eye: No discharge.     Extraocular Movements: Extraocular movements intact.  Conjunctiva/sclera: Conjunctivae normal.  Cardiovascular:     Rate and Rhythm: Normal rate.  Pulmonary:     Effort: Pulmonary effort is normal. No respiratory distress.     Breath sounds: No stridor. No wheezing.  Skin:    General: Skin is warm and dry.  Neurological:     General: No focal deficit present.     Mental Status: He is alert and oriented to person, place, and time.  Psychiatric:        Mood and Affect: Mood normal.        Behavior: Behavior normal.     Procedures  No results found for this or any previous visit (from the past 24 hours).  DG Chest 2 View Result Date: 12/18/2023 EXAM: 2 VIEW(S) XRAY OF THE CHEST 12/18/2023 01:26:03 PM COMPARISON: 08/12/2019 CLINICAL HISTORY: Chest congestion. FINDINGS: LUNGS AND PLEURA: No focal pulmonary opacity. No pulmonary edema. No pleural effusion. No pneumothorax. HEART AND MEDIASTINUM: No acute abnormality of the cardiac and mediastinal silhouettes. BONES AND SOFT TISSUES: No acute osseous abnormality. IMPRESSION: 1. No acute cardiopulmonary disease Electronically signed by: Lynwood Seip MD 12/18/2023 01:46 PM EDT RP Workstation: [id number]     Assessment and Plan :     Discharge Instructions       1. Acute viral syndrome (Primary) - DG  Chest 2 View x-ray performed in UC shows no acute cardiopulmonary processes, no sign of consolidation or pneumonia. - predniSONE  (DELTASONE ) 20 MG tablet; Take 2 tablets (40 mg total) by mouth daily for 5 days.  Dispense: 10 tablet; Refill: 0 - promethazine -dextromethorphan (PROMETHAZINE -DM) 6.25-15 MG/5ML syrup; Take 10 mLs by mouth 3 (three) times daily as needed for cough.  Dispense: 240 mL; Refill: 0 - albuterol  (VENTOLIN  HFA) 108 (90 Base) MCG/ACT inhaler; Inhale 1-2 puffs into the lungs every 6 (six) hours as needed for wheezing or shortness of breath.  Dispense: 18 g; Refill: 0 - Continue using previously prescribed azelastine  and Flonase  nasal sprays for nasal congestion and postnasal drip. -Continue to monitor symptoms for any change in severity if there is any escalation of current symptoms or development of new symptoms follow-up in ER for further evaluation and management.      Alvira Hecht B Nason Conradt   Nicholaos Schippers B, [place] 12/18/23 1501

## 2023-12-18 NOTE — ED Triage Notes (Signed)
 Pt c/o cough, congestion, post nasal drip for 1 week. States he is starting to get soreness in upper chest.

## 2023-12-18 NOTE — Discharge Instructions (Signed)
  1. Acute viral syndrome (Primary) - DG Chest 2 View x-ray performed in UC shows no acute cardiopulmonary processes, no sign of consolidation or pneumonia. - predniSONE  (DELTASONE ) 20 MG tablet; Take 2 tablets (40 mg total) by mouth daily for 5 days.  Dispense: 10 tablet; Refill: 0 - promethazine -dextromethorphan (PROMETHAZINE -DM) 6.25-15 MG/5ML syrup; Take 10 mLs by mouth 3 (three) times daily as needed for cough.  Dispense: 240 mL; Refill: 0 - albuterol  (VENTOLIN  HFA) 108 (90 Base) MCG/ACT inhaler; Inhale 1-2 puffs into the lungs every 6 (six) hours as needed for wheezing or shortness of breath.  Dispense: 18 g; Refill: 0 - Continue using previously prescribed azelastine  and Flonase  nasal sprays for nasal congestion and postnasal drip. -Continue to monitor symptoms for any change in severity if there is any escalation of current symptoms or development of new symptoms follow-up in ER for further evaluation and management.

## 2023-12-19 ENCOUNTER — Other Ambulatory Visit: Payer: Self-pay | Admitting: *Deleted

## 2023-12-19 DIAGNOSIS — Z87891 Personal history of nicotine dependence: Secondary | ICD-10-CM

## 2023-12-19 DIAGNOSIS — Z8249 Family history of ischemic heart disease and other diseases of the circulatory system: Secondary | ICD-10-CM

## 2023-12-20 ENCOUNTER — Encounter: Payer: Self-pay | Admitting: Adult Health

## 2023-12-20 ENCOUNTER — Ambulatory Visit: Admitting: Adult Health

## 2023-12-20 ENCOUNTER — Ambulatory Visit: Payer: Self-pay

## 2023-12-20 VITALS — BP 132/70 | HR 58 | Temp 98.3°F | Ht 74.75 in

## 2023-12-20 DIAGNOSIS — J0100 Acute maxillary sinusitis, unspecified: Secondary | ICD-10-CM

## 2023-12-20 DIAGNOSIS — K4090 Unilateral inguinal hernia, without obstruction or gangrene, not specified as recurrent: Secondary | ICD-10-CM | POA: Diagnosis not present

## 2023-12-20 MED ORDER — DOXYCYCLINE HYCLATE 100 MG PO CAPS: 100.0000 mg | ORAL_CAPSULE | Freq: Two times a day (BID) | ORAL | 0 refills | Status: DC

## 2023-12-20 NOTE — Progress Notes (Signed)
 Subjective:    Patient ID: Charles Tate, male    DOB: 1950-09-14, 73 y.o.   MRN: [id number]  Sinus Problem   Discussed the use of AI scribe software for clinical note transcription with the patient, who gave verbal consent to proceed.  History of Present Illness   Charles Tate is a 73 year old male who presents with persistent cough and postnasal drainage.  He has experienced left-sided postnasal drainage for several days, progressing to a scratchy throat and tickle cough for about one and a half to two weeks. He uses Flonase , astelin  and normal saline spray daily which provided some relief. He occasionally takes Advil and has not had a fever. The cough has worsened, with his wife noting nocturnal coughing and loud snoring. A chest x-ray at urgent care showed clear lungs. He was prescribed prednisone , an albuterol  inhaler, and cough medicine, which have been somewhat helpful but continues to have left sided sinus pressure and nasal congestion  Additionally, He describes a tender area on the left lower abdomen side, associated with physical work in his [employer] business. He experiences a burning sensation and pain radiating down his groin area when standing for long periods, which subsides when changing position or sitting and is exacerbated by coughing. He recalls being informed of a hernia years ago that was never surgically repaired        Review of Systems See HPI   Past Medical History:  Diagnosis Date   Allergy    Arthritis    left hip    Carotid artery occlusion    GERD (gastroesophageal reflux disease)    per patient, resolved. 03/15/20   Heart murmur    adolescent rheumatic fever - developed murmur    Hyperlipidemia    Hypertension    RAD (reactive airway disease)    with RTIs   Tubular adenoma     Social History   Socioeconomic History   Marital status: Married    Spouse name: Avelina   Number of children: 2   Years of education: Not on  file   Highest education level: Some college, no degree  Occupational History   Occupation: self employed   Tobacco Use   Smoking status: Former   Smokeless tobacco: Never   Tobacco comments:    intermittent, short term smoker as a teen. Some second hand smoke in 40s & 30s  Vaping Use   Vaping status: Never Used  Substance and Sexual Activity   Alcohol use: Not Currently   Drug use: No   Sexual activity: Not on file  Other Topics Concern   Not on file  Social History Narrative   Exercise: none regularly      Lives with wife/2025   Social Drivers of Health   Financial Resource Strain: Low Risk  (10/03/2023)   Overall Financial Resource Strain (CARDIA)    Difficulty of Paying Living Expenses: Not hard at all  Food Insecurity: No Food Insecurity (10/03/2023)   Hunger Vital Sign    Worried About Running Out of Food in the Last Year: Never true    Ran Out of Food in the Last Year: Never true  Transportation Needs: No Transportation Needs (10/03/2023)   PRAPARE - Administrator, Civil Service (Medical): No    Lack of Transportation (Non-Medical): No  Physical Activity: Sufficiently Active (10/03/2023)   Exercise Vital Sign    Days of Exercise per Week: 3 days    Minutes of Exercise  per Session: 90 min  Stress: No Stress Concern Present (10/03/2023)   [employer] of Occupational Health - Occupational Stress Questionnaire    Feeling of Stress: Not at all  Social Connections: Socially Integrated (10/03/2023)   Social Connection and Isolation Panel    Frequency of Communication with Friends and Family: More than three times a week    Frequency of Social Gatherings with Friends and Family: More than three times a week    Attends Religious Services: More than 4 times per year    Active Member of Clubs or Organizations: Yes    Attends Banker Meetings: More than 4 times per year    Marital Status: Married  Catering manager Violence: Not At Risk (10/03/2023)    Humiliation, Afraid, Rape, and Kick questionnaire    Fear of Current or Ex-Partner: No    Emotionally Abused: No    Physically Abused: No    Sexually Abused: No    Past Surgical History:  Procedure Laterality Date   COLONOSCOPY     colonoscopy with polypectomy  2013   Dr Zulema, [place]   COLONOSCOPY WITH PROPOFOL  N/A 07/01/2019   Procedure: COLONOSCOPY WITH PROPOFOL ;  Surgeon: Wilhelmenia Aloha Raddle., MD;  Location: [facility];  Service: Gastroenterology;  Laterality: N/A;   COLONOSCOPY WITH PROPOFOL  N/A 05/30/2020   Procedure: COLONOSCOPY WITH PROPOFOL ;  Surgeon: Mansouraty, Aloha Raddle., MD;  Location: [facility];  Service: Gastroenterology;  Laterality: N/A;   ENDARTERECTOMY Right 04/11/2020   Procedure: Right Carotid Endarterectomy;  Surgeon: Harvey Carlin BRAVO, MD;  Location: [facility];  Service: Vascular;  Laterality: Right;   ENDOSCOPIC MUCOSAL RESECTION N/A 07/01/2019   Procedure: ENDOSCOPIC MUCOSAL RESECTION;  Surgeon: Wilhelmenia Aloha Raddle., MD;  Location: [facility];  Service: Gastroenterology;  Laterality: N/A;   ENDOSCOPIC MUCOSAL RESECTION N/A 05/30/2020   Procedure: ENDOSCOPIC MUCOSAL RESECTION;  Surgeon: Wilhelmenia Aloha Raddle., MD;  Location: [facility];  Service: Gastroenterology;  Laterality: N/A;   HEMOSTASIS CLIP PLACEMENT  07/01/2019   Procedure: HEMOSTASIS CLIP PLACEMENT;  Surgeon: Wilhelmenia Aloha Raddle., MD;  Location: [facility];  Service: Gastroenterology;;   POLYPECTOMY     POLYPECTOMY  07/01/2019   Procedure: POLYPECTOMY;  Surgeon: Wilhelmenia Aloha Raddle., MD;  Location: [facility];  Service: Gastroenterology;;   POLYPECTOMY  05/30/2020   Procedure: POLYPECTOMY;  Surgeon: Wilhelmenia Aloha Raddle., MD;  Location: [facility];  Service: Gastroenterology;;   SUBMUCOSAL LIFTING INJECTION  07/01/2019   Procedure: SUBMUCOSAL LIFTING INJECTION;  Surgeon: Wilhelmenia Aloha Raddle., MD;  Location: [facility];  Service: Gastroenterology;;   SUBMUCOSAL TATTOO  INJECTION  07/01/2019   Procedure: SUBMUCOSAL TATTOO INJECTION;  Surgeon: Wilhelmenia Aloha Raddle., MD;  Location: [facility];  Service: Gastroenterology;;   WISDOM TOOTH EXTRACTION      Family History  Problem Relation Age of Onset   Arthritis Mother    Hyperlipidemia Mother    Lupus Mother    Heart attack Father 20   Hypertension Sister    Heart attack Paternal Grandmother 1   Diabetes Paternal Aunt    Stroke Maternal Grandfather 57   Cancer Paternal Grandfather        ? stomach; in 3s   Colon cancer Paternal Grandfather    Colon polyps Neg Hx    Esophageal cancer Neg Hx    Rectal cancer Neg Hx    Stomach cancer Neg Hx    Inflammatory bowel disease Neg Hx    Liver disease Neg Hx    Pancreatic cancer Neg Hx  Allergies  Allergen Reactions   Ciprofloxacin Hcl     Numbness, joint pain   Cefdinir  Diarrhea    Fever, chills, stomach cramps    Tamsulosin Other (See Comments)    Lightheadedness, sinus issues   Lisinopril Cough    Current Outpatient Medications on File Prior to Visit  Medication Sig Dispense Refill   albuterol  (VENTOLIN  HFA) 108 (90 Base) MCG/ACT inhaler Inhale 1-2 puffs into the lungs every 6 (six) hours as needed for wheezing or shortness of breath. 18 g 0   aspirin  EC 81 MG tablet Take 81 mg by mouth daily. Swallow whole.     atorvastatin  (LIPITOR) 40 MG tablet TAKE 1 TABLET BY MOUTH EVERY DAY 90 tablet 3   azelastine  (ASTELIN ) 0.1 % nasal spray Place 1 spray into both nostrils 2 (two) times daily. Use in each nostril as directed 30 mL 12   Colostrum 500 MG CAPS Take 500 mg by mouth daily.     fluticasone  (FLONASE ) 50 MCG/ACT nasal spray SPRAY 2 SPRAYS INTO EACH NOSTRIL EVERY DAY 48 mL 2   hydrochlorothiazide  (HYDRODIURIL ) 25 MG tablet TAKE 1 TABLET (25 MG TOTAL) BY MOUTH DAILY. 30 tablet 5   Misc Natural Products (SUPER GREENS PO) Take 1 Scoop by mouth daily.      predniSONE  (DELTASONE ) 20 MG tablet Take 2 tablets (40 mg total) by mouth daily for 5  days. 10 tablet 0   promethazine -dextromethorphan (PROMETHAZINE -DM) 6.25-15 MG/5ML syrup Take 10 mLs by mouth 3 (three) times daily as needed for cough. 240 mL 0   telmisartan  (MICARDIS ) 80 MG tablet TAKE 1 TABLET BY MOUTH EVERY DAY 90 tablet 3   No current facility-administered medications on file prior to visit.    BP 132/70   Pulse (!) 58   Temp 98.3 F (36.8 C) (Oral)   Ht 6' 2.75 (1.899 m)   SpO2 96%   BMI 23.15 kg/m       Objective:   Physical Exam Vitals and nursing note reviewed.  Constitutional:      Appearance: Normal appearance.  HENT:     Nose: Congestion and rhinorrhea present.     Left Turbinates: Enlarged and swollen.     Left Sinus: Maxillary sinus tenderness present.  Cardiovascular:     Rate and Rhythm: Normal rate and regular rhythm.     Pulses: Normal pulses.     Heart sounds: Normal heart sounds.  Pulmonary:     Effort: Pulmonary effort is normal.     Breath sounds: Normal breath sounds.  Abdominal:     Hernia: A hernia is present. Hernia is present in the left inguinal area (easily reduced but minor discomfort with palpation).  Musculoskeletal:        General: Normal range of motion.  Skin:    General: Skin is warm and dry.  Neurological:     General: No focal deficit present.     Mental Status: He is alert and oriented to person, place, and time.  Psychiatric:        Mood and Affect: Mood normal.        Behavior: Behavior normal.        Thought Content: Thought content normal.        Judgment: Judgment normal.        Assessment & Plan:   1. Acute non-recurrent maxillary sinusitis (Primary) - Likely sinus infection. Will treat due to symptoms and duration.  - Follow up with PCP if not improving in the next week -  doxycycline  (VIBRAMYCIN ) 100 MG capsule; Take 1 capsule (100 mg total) by mouth 2 (two) times daily.  Dispense: 14 capsule; Refill: 0  2. Left inguinal hernia -  He would like to discuss with general surgery  - Ambulatory  referral to General Surgery   Darleene Shape, NP

## 2023-12-20 NOTE — Telephone Encounter (Signed)
        FYI Only or Action Required?: FYI only for provider.  Patient was last seen in primary care on 10/18/2023 by Geofm Glade PARAS, MD.  Called Nurse Triage reporting Cough.  Symptoms began several weeks ago.  Interventions attempted: Prescription medications: albuterol , prednisone , cough medicine.  Symptoms are: gradually worsening.  Triage Disposition: See PCP When Office is Open (Within 3 Days)  Patient/caregiver understands and will follow disposition?: Yes      Copied from CRM [id number]. Topic: Clinical - Red Word Triage >> Dec 20, 2023  9:23 AM Carlyon D wrote: Red Word that prompted transfer to Nurse Triage: coughing up balls of yellow mucus, Upper chest is tight and heavy, congestion, nasal drainage is very yellow, pt is getting worse. Reason for Disposition  [1] Nasal discharge AND [2] present > 10 days  Answer Assessment - Initial Assessment Questions PT states this has been going on about a week and a half. He went to [facility] on Wednesday and they gave him prednisone , inhaler and cough medicine but no antibiotic. He states he gets this at least once a year and always ends up on atbx. He stated when he went on Wednesday his mucus was clear. Now it is thick yellow. He states he is having chest tightness but the inhaler helps with that.     1. ONSET: When did the cough begin?      1.5 2. SEVERITY: How bad is the cough today?      Slightly worse 3. SPUTUM: Describe the color of your sputum (e.g., none, dry cough; clear, white, yellow, green)    thick yellow chunks  5. DIFFICULTY BREATHING: Are you having difficulty breathing? If Yes, ask: How bad is it? (e.g., mild, moderate, severe)      Tightness but the albuterol  inhaler helps 6. FEVER: Do you have a fever? If Yes, ask: What is your temperature, how was it measured, and when did it start?     no   10. OTHER SYMPTOMS: Do you have any other symptoms? (e.g., runny nose, wheezing, chest pain)        Tight, nasal drainage, and sore throat  Protocols used: Cough - Acute Productive-A-AH

## 2023-12-28 ENCOUNTER — Other Ambulatory Visit: Payer: Self-pay | Admitting: Internal Medicine

## 2024-01-28 NOTE — Progress Notes (Unsigned)
 HISTORY AND PHYSICAL     CC:  follow up. Requesting Provider:  Geofm Glade PARAS, MD  HPI: This is a 73 y.o. male who is here today for screening for AAA with family hx.  He has hx of right CEA for symptomatic (right retinal embolus) carotid artery stenosis on 04/11/2020 by Dr. Harvey.    Pt was last seen 12/16/2023 for his carotid follow up.  He had bilateral 1-39% ICA stenosis.  At that time, it was noted he had family hx of AAA with his maternal grandfather and he was brought back for screening AAA u/s.    He states that he has a left inguinal hernia that has been bothering him.  He has an appt with Dr. Vernetta.   He is not having any other abdominal pain.  He denies any claudication, rest pain or non healing wounds.  He states his feet are stiff when he wakes.  He states that he did not tolerate Atorvastatin .  He takes a baby asa daily.  He states that he cut his hydrochlorothiazide  in half and he has not had any further dizziness.  He denies any stroke symptoms since his last visit.   He has follow up with his PCP in January.  The pt is not on a statin for cholesterol management.  The pt is on a daily aspirin .   Other AC:  none The pt is on ARBB, diuretic for hypertension.   The pt is not on medication for diabetes Tobacco hx:  former  Past Medical History:  Diagnosis Date   Allergy    Arthritis    left hip    Carotid artery occlusion    GERD (gastroesophageal reflux disease)    per patient, resolved. 03/15/20   Heart murmur    adolescent rheumatic fever - developed murmur    Hyperlipidemia    Hypertension    RAD (reactive airway disease)    with RTIs   Tubular adenoma     Past Surgical History:  Procedure Laterality Date   COLONOSCOPY     colonoscopy with polypectomy  2013   Dr Zulema, [place]   COLONOSCOPY WITH PROPOFOL  N/A 07/01/2019   Procedure: COLONOSCOPY WITH PROPOFOL ;  Surgeon: Wilhelmenia Aloha Raddle., MD;  Location: [facility];  Service: Gastroenterology;   Laterality: N/A;   COLONOSCOPY WITH PROPOFOL  N/A 05/30/2020   Procedure: COLONOSCOPY WITH PROPOFOL ;  Surgeon: Wilhelmenia Aloha Raddle., MD;  Location: [facility];  Service: Gastroenterology;  Laterality: N/A;   ENDARTERECTOMY Right 04/11/2020   Procedure: Right Carotid Endarterectomy;  Surgeon: Harvey Carlin BRAVO, MD;  Location: [facility] OR;  Service: Vascular;  Laterality: Right;   ENDOSCOPIC MUCOSAL RESECTION N/A 07/01/2019   Procedure: ENDOSCOPIC MUCOSAL RESECTION;  Surgeon: Wilhelmenia Aloha Raddle., MD;  Location: [facility];  Service: Gastroenterology;  Laterality: N/A;   ENDOSCOPIC MUCOSAL RESECTION N/A 05/30/2020   Procedure: ENDOSCOPIC MUCOSAL RESECTION;  Surgeon: Wilhelmenia Aloha Raddle., MD;  Location: [facility];  Service: Gastroenterology;  Laterality: N/A;   HEMOSTASIS CLIP PLACEMENT  07/01/2019   Procedure: HEMOSTASIS CLIP PLACEMENT;  Surgeon: Wilhelmenia Aloha Raddle., MD;  Location: [facility];  Service: Gastroenterology;;   POLYPECTOMY     POLYPECTOMY  07/01/2019   Procedure: POLYPECTOMY;  Surgeon: Wilhelmenia Aloha Raddle., MD;  Location: [facility];  Service: Gastroenterology;;   POLYPECTOMY  05/30/2020   Procedure: POLYPECTOMY;  Surgeon: Wilhelmenia Aloha Raddle., MD;  Location: [facility];  Service: Gastroenterology;;   SUBMUCOSAL LIFTING INJECTION  07/01/2019   Procedure: SUBMUCOSAL LIFTING INJECTION;  Surgeon:  Mansouraty, Aloha Raddle., MD;  Location: [facility];  Service: Gastroenterology;;   ROBLEY TATTOO INJECTION  07/01/2019   Procedure: SUBMUCOSAL TATTOO INJECTION;  Surgeon: Wilhelmenia Aloha Raddle., MD;  Location: [facility];  Service: Gastroenterology;;   WISDOM TOOTH EXTRACTION      Allergies  Allergen Reactions   Ciprofloxacin Hcl     Numbness, joint pain   Cefdinir  Diarrhea    Fever, chills, stomach cramps    Tamsulosin Other (See Comments)    Lightheadedness, sinus issues   Lisinopril Cough    Current Outpatient Medications  Medication Sig Dispense Refill    albuterol  (VENTOLIN  HFA) 108 (90 Base) MCG/ACT inhaler Inhale 1-2 puffs into the lungs every 6 (six) hours as needed for wheezing or shortness of breath. 18 g 0   aspirin  EC 81 MG tablet Take 81 mg by mouth daily. Swallow whole.     atorvastatin  (LIPITOR) 40 MG tablet TAKE 1 TABLET BY MOUTH EVERY DAY 90 tablet 3   azelastine  (ASTELIN ) 0.1 % nasal spray Place 1 spray into both nostrils 2 (two) times daily. Use in each nostril as directed 30 mL 12   Colostrum 500 MG CAPS Take 500 mg by mouth daily.     doxycycline  (VIBRAMYCIN ) 100 MG capsule Take 1 capsule (100 mg total) by mouth 2 (two) times daily. 14 capsule 0   fluticasone  (FLONASE ) 50 MCG/ACT nasal spray SPRAY 2 SPRAYS INTO EACH NOSTRIL EVERY DAY 48 mL 2   hydrochlorothiazide  (HYDRODIURIL ) 25 MG tablet TAKE 1 TABLET (25 MG TOTAL) BY MOUTH DAILY. 30 tablet 5   Misc Natural Products (SUPER [employer] PO) Take 1 Scoop by mouth daily.      promethazine -dextromethorphan (PROMETHAZINE -DM) 6.25-15 MG/5ML syrup Take 10 mLs by mouth 3 (three) times daily as needed for cough. 240 mL 0   telmisartan  (MICARDIS ) 80 MG tablet TAKE 1 TABLET BY MOUTH EVERY DAY 90 tablet 3   No current facility-administered medications for this visit.    Family History  Problem Relation Age of Onset   Arthritis Mother    Hyperlipidemia Mother    Lupus Mother    Heart attack Father 36   Hypertension Sister    Heart attack Paternal Grandmother 36   Diabetes Paternal Aunt    Stroke Maternal Grandfather 61   Cancer Paternal Grandfather        ? stomach; in 11s   Colon cancer Paternal Grandfather    Colon polyps Neg Hx    Esophageal cancer Neg Hx    Rectal cancer Neg Hx    Stomach cancer Neg Hx    Inflammatory bowel disease Neg Hx    Liver disease Neg Hx    Pancreatic cancer Neg Hx     Social History   Socioeconomic History   Marital status: Married    Spouse name: Avelina   Number of children: 2   Years of education: Not on file   Highest education level:  Some college, no degree  Occupational History   Occupation: self employed   Tobacco Use   Smoking status: Former   Smokeless tobacco: Never   Tobacco comments:    intermittent, short term smoker as a teen. Some second hand smoke in 52s & 30s  Vaping Use   Vaping status: Never Used  Substance and Sexual Activity   Alcohol use: Not Currently   Drug use: No   Sexual activity: Not on file  Other Topics Concern   Not on file  Social History Narrative   Exercise: none  regularly      Lives with wife/2025   Social Drivers of Health   Financial Resource Strain: Low Risk  (10/03/2023)   Overall Financial Resource Strain (CARDIA)    Difficulty of Paying Living Expenses: Not hard at all  Food Insecurity: No Food Insecurity (10/03/2023)   Hunger Vital Sign    Worried About Running Out of Food in the Last Year: Never true    Ran Out of Food in the Last Year: Never true  Transportation Needs: No Transportation Needs (10/03/2023)   PRAPARE - Administrator, Civil Service (Medical): No    Lack of Transportation (Non-Medical): No  Physical Activity: Sufficiently Active (10/03/2023)   Exercise Vital Sign    Days of Exercise per Week: 3 days    Minutes of Exercise per Session: 90 min  Stress: No Stress Concern Present (10/03/2023)   [employer] of Occupational Health - Occupational Stress Questionnaire    Feeling of Stress: Not at all  Social Connections: Socially Integrated (10/03/2023)   Social Connection and Isolation Panel    Frequency of Communication with Friends and Family: More than three times a week    Frequency of Social Gatherings with Friends and Family: More than three times a week    Attends Religious Services: More than 4 times per year    Active Member of [employer] or Organizations: Yes    Attends Engineer, Structural: More than 4 times per year    Marital Status: Married  Catering Manager Violence: Not At Risk (10/03/2023)   Humiliation, Afraid, Rape,  and Kick questionnaire    Fear of Current or Ex-Partner: No    Emotionally Abused: No    Physically Abused: No    Sexually Abused: No     REVIEW OF SYSTEMS:   [X]  denotes positive finding, [ ]  denotes negative finding Cardiac  Comments:  Chest pain or chest pressure:    Shortness of breath upon exertion:    Short of breath when lying flat:    Irregular heart rhythm:        Vascular    Pain in calf, thigh, or hip brought on by ambulation:    Pain in feet at night that wakes you up from your sleep:     Blood clot in your veins:    Leg swelling:         Pulmonary    Oxygen at home:    Productive cough:     Wheezing:         Neurologic    Sudden weakness in arms or legs:     Sudden numbness in arms or legs:     Sudden onset of difficulty speaking or slurred speech:    Temporary loss of vision in one eye:     Problems with dizziness:         Gastrointestinal    Blood in stool:     Vomited blood:         Genitourinary    Burning when urinating:     Blood in urine:        Psychiatric    Major depression:         Hematologic    Bleeding problems:    Problems with blood clotting too easily:        Skin    Rashes or ulcers:        Constitutional    Fever or chills:      PHYSICAL EXAMINATION:  Today's Vitals  01/29/24 0822  BP: 120/70  Pulse: 60  Temp: 98.2 F (36.8 C)  TempSrc: Temporal  Weight: 189 lb 6.4 oz (85.9 kg)  PainSc: 0-No pain   Body mass index is 23.83 kg/m.   General:  WDWN in NAD; vital signs documented above Gait: Not observed HENT: WNL, normocephalic Pulmonary: normal non-labored breathing  Cardiac: regular HR Skin: without rashes Vascular Exam/Pulses: 2+ right PT pulse and 1+ right DP pulse 2+ DP/PT pulse left Extremities: without ischemic changes, without Gangrene , without cellulitis; without open wounds Musculoskeletal: no muscle wasting or atrophy  Neurologic: A&O X 3;  No focal weakness or paresthesias are  detected Psychiatric:  The pt has Normal affect.   Non-Invasive Vascular Imaging:   Screening AAA Arterial duplex on 01/29/2024: Abdominal Aorta Findings:  +-------------+-------+----------+----------+--------+--------+--------+  Location    AP (cm)Trans (cm)PSV (cm/s)WaveformThrombusComments  +-------------+-------+----------+----------+--------+--------+--------+  Proximal    1.81   1.82      122                                 +-------------+-------+----------+----------+--------+--------+--------+  Mid         1.79   1.86      130                                 +-------------+-------+----------+----------+--------+--------+--------+  Distal      1.72   1.66      170                                 +-------------+-------+----------+----------+--------+--------+--------+  RT CIA Prox  1.2    1.3       184                                 +-------------+-------+----------+----------+--------+--------+--------+  RT CIA Mid                    402                                 +-------------+-------+----------+----------+--------+--------+--------+  RT CIA Distal                 354                                 +-------------+-------+----------+----------+--------+--------+--------+  LT CIA Prox  1.2    1.3       213                                 +-------------+-------+----------+----------+--------+--------+--------+  LT CIA Mid                    185                                 +-------------+-------+----------+----------+--------+--------+--------+  LT CIA Distal                 162                                 +-------------+-------+----------+----------+--------+--------+--------+  Summary:  Abdominal Aorta: No evidence of an abdominal aortic aneurysm was visualized. The largest aortic measurement is 1.9 cm. 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      ASSESSMENT/PLAN:: 73 y.o. male here for follow up for screening AAA.  He is normally followed for hx of right CEA for symptomatic (right retinal embolus) carotid artery stenosis on 04/11/2020 by Dr. Harvey.    -Medicare screening reveals no AAA.  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  He has 2+ right PT and 1+ right DP pulse.   -continue asa.  He was not able to tolerate Atorvastatin  due to myalgias.  Discussed with him that he would benefit from statin given PAD and to discuss with PCP in January about trying a different statin to see if he can tolerate.   -discussed with pt if he develops short distance claudication, severe rest pain or non healing wounds, he should contact us  sooner, otherwise, we will see him back in one year with carotid duplex, aortoiliac duplex and get baseline ABI.  He expressed good understanding.    Lucie Apt, [facility] [phone]  Clinic MD:   Sheree

## 2024-01-29 ENCOUNTER — Ambulatory Visit (HOSPITAL_COMMUNITY)
Admission: RE | Admit: 2024-01-29 | Discharge: 2024-01-29 | Disposition: A | Discharge status: Home / Self Care | Source: Ambulatory Visit | Attending: Vascular Surgery | Admitting: Vascular Surgery

## 2024-01-29 ENCOUNTER — Ambulatory Visit: Admitting: Physician Assistant

## 2024-01-29 VITALS — BP 120/70 | HR 60 | Temp 98.2°F | Wt 189.4 lb

## 2024-01-29 DIAGNOSIS — Z136 Encounter for screening for cardiovascular disorders: Secondary | ICD-10-CM | POA: Insufficient documentation

## 2024-01-29 DIAGNOSIS — Z87891 Personal history of nicotine dependence: Secondary | ICD-10-CM | POA: Diagnosis not present

## 2024-01-29 DIAGNOSIS — I6529 Occlusion and stenosis of unspecified carotid artery: Secondary | ICD-10-CM | POA: Diagnosis not present

## 2024-01-29 DIAGNOSIS — Z79899 Other long term (current) drug therapy: Secondary | ICD-10-CM | POA: Insufficient documentation

## 2024-01-29 DIAGNOSIS — I708 Atherosclerosis of other arteries: Secondary | ICD-10-CM | POA: Diagnosis not present

## 2024-01-29 DIAGNOSIS — K4090 Unilateral inguinal hernia, without obstruction or gangrene, not specified as recurrent: Secondary | ICD-10-CM | POA: Insufficient documentation

## 2024-01-29 DIAGNOSIS — I10 Essential (primary) hypertension: Secondary | ICD-10-CM | POA: Insufficient documentation

## 2024-01-29 DIAGNOSIS — Z7982 Long term (current) use of aspirin: Secondary | ICD-10-CM | POA: Insufficient documentation

## 2024-01-29 DIAGNOSIS — Z8249 Family history of ischemic heart disease and other diseases of the circulatory system: Secondary | ICD-10-CM | POA: Diagnosis not present

## 2024-01-29 DIAGNOSIS — I771 Stricture of artery: Secondary | ICD-10-CM

## 2024-02-01 ENCOUNTER — Encounter: Payer: Self-pay | Admitting: Internal Medicine

## 2024-02-17 DIAGNOSIS — R3915 Urgency of urination: Secondary | ICD-10-CM | POA: Diagnosis not present

## 2024-02-17 DIAGNOSIS — R972 Elevated prostate specific antigen [PSA]: Secondary | ICD-10-CM | POA: Diagnosis not present

## 2024-02-17 DIAGNOSIS — N401 Enlarged prostate with lower urinary tract symptoms: Secondary | ICD-10-CM | POA: Diagnosis not present

## 2024-02-17 DIAGNOSIS — N5201 Erectile dysfunction due to arterial insufficiency: Secondary | ICD-10-CM | POA: Diagnosis not present

## 2024-02-17 DIAGNOSIS — K4090 Unilateral inguinal hernia, without obstruction or gangrene, not specified as recurrent: Secondary | ICD-10-CM | POA: Diagnosis not present

## 2024-02-25 ENCOUNTER — Ambulatory Visit: Payer: Self-pay | Admitting: Internal Medicine

## 2024-02-25 NOTE — Telephone Encounter (Signed)
 FYI Only or Action Required?: Action required by provider: request for medication for flu symptoms and exposure .  Patient was last seen in primary care on 12/20/2023 by Merna Huxley, NP.  Called Nurse Triage reporting Influenza.  Symptoms began today.  Interventions attempted: OTC medications: Flonase   and azelastine  spray .  Symptoms are: stable.  Triage Disposition: Call PCP Within 24 Hours  Patient/caregiver understands and will follow disposition?: Yes        Fabrizio [phone] Reason for Disposition  Patient is HIGH RISK (e.g., 65 years and older, pregnant, HIV+, or chronic medical condition)  Answer Assessment - Initial Assessment Questions Patient calls reporting exposure to flu A on Sunday. Reports mild symptoms began today that have stayed about the same. Is wondering if should get tamiflu  as preventive. Requesting  medication [facility] [address] .   1. SYMPTOMS: What is your main symptom or concern? (e.g., cough, fever, shortness of breath, muscle aches)     Scratchy throat and drainage  2. ONSET: When did the symptoms start?      Today  3. COUGH: Do you have a cough? If Yes, ask: How bad is the cough?       Not really yet  4. FEVER: Do you have a fever? If Yes, ask: What is your temperature, how was it measured, and when did it start?     Denies  5. BREATHING DIFFICULTY: Are you having any difficulty breathing? (e.g., normal; shortness of breath, wheezing, unable to speak)      Denies  6. BETTER-SAME-WORSE: Are you getting better, staying the same or getting worse compared to yesterday?  If getting worse, ask, In what way?     Have been about the same since started  7. OTHER SYMPTOMS: Do you have any other symptoms?  (e.g., chills, fatigue, headache, loss of smell or taste, muscle pain, sore throat)     Passing a lot of has, more  fatigued  feeling a little out of it but aware of time, place person, able to ambulate  independently  8. INFLUENZA EXPOSURE: Was there any known exposure to influenza (flu) before the symptoms began?      Sunday   9. INFLUENZA SUSPECTED: Why do you think you have influenza? (e.g., positive flu self-test at home, symptoms after exposure).     Exposured was Sunday  10. INFLUENZA VACCINE: Have you had the flu vaccine? If Yes, ask: When did you last get it?       Did not have  11. HIGH RISK FOR COMPLICATIONS: Do you have any chronic medical problems? (e.g., asthma, heart or lung disease, obesity, weak immune system)       Hypertension  Denies the following chest pain, difficulty breathing, wheezing, fever, vomiting , dizziness, weakness  Protocols used: Influenza (Flu) Suspected-A-AH Reason for CRM: Patient states his Granddaughter has flu A and he was in contact with her and he hasn't had his flu vaccine. States he's 73 years old and doesn't want to get sick, wants to know if provider will send in a preventative like Tamaflu or something. States he's having some runny nose that's it.

## 2024-02-26 ENCOUNTER — Telehealth: Payer: Self-pay

## 2024-02-26 NOTE — Telephone Encounter (Signed)
 Copied from CRM #[id number]. Topic: Clinical - Medication Question >> Feb 26, 2024 10:33 AM Rea BROCKS wrote: Reason for CRM: Patient is calling back to follow up on whether he can receive tamiflu  or not. He was triaged yesterday for symptoms.   [phone] [facility])   [id number] [place], [place] - [address] [place] [address] Phone: [phone] Fax: [phone]

## 2024-02-28 MED ORDER — OSELTAMIVIR PHOSPHATE 75 MG PO CAPS: 75.0000 mg | ORAL_CAPSULE | Freq: Two times a day (BID) | ORAL | 0 refills | Status: AC

## 2024-02-28 NOTE — Telephone Encounter (Signed)
Please advise as MD is out of office

## 2024-03-03 NOTE — Telephone Encounter (Signed)
 Old message.  Medication was sent in

## 2024-03-11 ENCOUNTER — Encounter: Payer: Self-pay | Admitting: Physician Assistant

## 2024-03-15 ENCOUNTER — Other Ambulatory Visit: Payer: Self-pay | Admitting: Internal Medicine

## 2024-03-16 ENCOUNTER — Telehealth (HOSPITAL_BASED_OUTPATIENT_CLINIC_OR_DEPARTMENT_OTHER): Payer: Self-pay | Admitting: *Deleted

## 2024-03-16 NOTE — Telephone Encounter (Signed)
"  ° °  Pre-operative Risk Assessment    Patient Name: Charles Tate  DOB: 14-Oct-1950 MRN: [id number]   Date of last office visit: 02/11/2023 Date of next office visit: 03/23/2024 Pre Op addede to appt notes.  Request for Surgical Clearance   Procedure:  Hernia Repair  Date of Surgery:  Clearance TBD                                 Surgeon:  Dr. Lynda Leos Surgeon's Group or Practice Name:  [facility] Phone number:  [phone] Fax number:  [phone]   Type of Clearance Requested:   HOLD Aspirin  Not indicated.  Type of Anesthesia:  General    Additional requests/questions:    Signed, Edsel Grayce Sanders   03/16/2024, 3:38 PM   "

## 2024-03-23 ENCOUNTER — Ambulatory Visit: Admitting: Family Medicine

## 2024-03-23 ENCOUNTER — Ambulatory Visit: Admitting: Cardiovascular Disease

## 2024-03-23 ENCOUNTER — Other Ambulatory Visit: Payer: Self-pay | Admitting: Family Medicine

## 2024-03-23 VITALS — BP 126/76 | HR 68 | Temp 98.0°F | Resp 18 | Ht 74.75 in | Wt 190.0 lb

## 2024-03-23 DIAGNOSIS — J069 Acute upper respiratory infection, unspecified: Secondary | ICD-10-CM

## 2024-03-23 DIAGNOSIS — J309 Allergic rhinitis, unspecified: Secondary | ICD-10-CM | POA: Diagnosis not present

## 2024-03-23 DIAGNOSIS — B349 Viral infection, unspecified: Secondary | ICD-10-CM

## 2024-03-23 DIAGNOSIS — J4521 Mild intermittent asthma with (acute) exacerbation: Secondary | ICD-10-CM

## 2024-03-23 LAB — POCT RAPID STREP A (OFFICE): Rapid Strep A Screen: NEGATIVE

## 2024-03-23 MED ORDER — ALBUTEROL SULFATE HFA 108 (90 BASE) MCG/ACT IN AERS: 1.0000 | INHALATION_SPRAY | Freq: Four times a day (QID) | RESPIRATORY_TRACT | 0 refills | Status: AC | PRN

## 2024-03-23 MED ORDER — AIRSUPRA 90-80 MCG/ACT IN AERO: 2.0000 | INHALATION_SPRAY | Freq: Four times a day (QID) | RESPIRATORY_TRACT | 0 refills | Status: DC | PRN

## 2024-03-23 NOTE — Assessment & Plan Note (Signed)
 Symptoms managed with Flonase  and Astelin , exacerbated by environmental allergens. - Continue Flonase  and Astelin  as prescribed. - Add Zyrtec regularly to manage symptoms.

## 2024-03-23 NOTE — Progress Notes (Signed)
 "  Assessment & Plan Viral URI Symptoms improving, indicating viral etiology. No antibiotics needed. - Switched albuterol  to [employer] for better lung support. - Advised to monitor symptoms and report if worsens. - Education provided on viral URIs Orders:   POCT rapid strep A  Mild intermittent reactive airway disease with acute exacerbation Susceptibility to irritants, possibly exacerbated by allergens. Albuterol  causes jitteriness. - Switched albuterol  to [employer] for better management of symptoms. 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 Orders:   Albuterol -Budesonide (AIRSUPRA ) 90-80 MCG/ACT AERO; Inhale 2 puffs into the lungs every 6 (six) hours as needed.  Allergic rhinitis, unspecified seasonality, unspecified trigger Symptoms managed with Flonase  and Astelin , exacerbated by environmental allergens. - Continue Flonase  and Astelin  as prescribed. - Add Zyrtec regularly to manage symptoms.     Follow up plan: Return in about 4 weeks (around 04/20/2024) for chronic follow-up with PCP.  Niki Rung, MSN, APRN, FNP-C  Subjective:  HPI: Charles Tate is a 74 y.o. male presenting on 03/23/2024 for Sore Throat (ST (some better this morning), congestion, nasal drainage increased, chest tight and cough. /no fever/Symptoms started on Friday )  Discussed the use of AI scribe software for clinical note transcription with the patient, who gave verbal consent to proceed.  He has been experiencing sore throat, congestion, drainage, chest tightness, and coughing since Friday. The congestion is primarily in the back of his throat rather than a runny or stuffed nose. This is a recurring issue, happening annually, with a similar episode in October treated at urgent care.  He uses leftover cough syrup from a previous prescription at night and an albuterol  inhaler to alleviate chest tightness, though it causes jitteriness. He is coughing up yellow  sputum and had a sore throat that felt like 'razor blades' in the mornings, which has improved slightly today.  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 He recalls a past diagnosis of reactive airways disease, making him more susceptible to airborne irritants.  He uses Flonase  and Astelin  nasal sprays, two squirts of Flonase  in each nostril in the morning and one squirt of Astelin , repeated at night. He occasionally forgets to use them daily. He took Zyrtec yesterday and has a prescription for regular use of these medications.  No fever. He is concerned about being contagious, especially with his granddaughter visiting soon, who recently recovered from various illnesses during her first year in kindergarten.      ROS: Negative unless specifically indicated above in HPI.   Relevant past medical history reviewed and updated as indicated.   Allergies and medications reviewed and updated.  Current Medications[1]  Allergies[2]  Objective:   BP 126/76   Pulse 68   Temp 98 F (36.7 C)   Resp 18   Ht 6' 2.75 (1.899 m)   Wt 190 lb (86.2 kg)   SpO2 95%   BMI 23.91 kg/m    Physical Exam Vitals reviewed.  Constitutional:      General: He is not in acute distress.    Appearance: Normal appearance. He is not ill-appearing, toxic-appearing or diaphoretic.  HENT:     Head: Normocephalic and atraumatic.     Right Ear: Tympanic membrane, ear canal and external ear normal. There is no impacted cerumen.     Left Ear: Tympanic membrane, ear canal and external ear normal. There is no impacted cerumen.     Nose: Nose normal.  Right Sinus: No maxillary sinus tenderness or frontal sinus tenderness.     Left Sinus: No maxillary sinus tenderness or frontal sinus tenderness.     Mouth/Throat:     Mouth: Mucous membranes are moist.     Pharynx: Oropharynx is clear. No oropharyngeal exudate or posterior oropharyngeal erythema.     Tonsils: No tonsillar  exudate or tonsillar abscesses.  Eyes:     General: No scleral icterus.       Right eye: No discharge.        Left eye: No discharge.     Conjunctiva/sclera: Conjunctivae normal.  Cardiovascular:     Rate and Rhythm: Normal rate.  Pulmonary:     Effort: Pulmonary effort is normal. No respiratory distress.  Musculoskeletal:        General: Normal range of motion.     Cervical back: Normal range of motion.  Lymphadenopathy:     Cervical: Cervical adenopathy present.  Skin:    General: Skin is warm and dry.  Neurological:     Mental Status: He is alert and oriented to person, place, and time. Mental status is at baseline.  Psychiatric:        Mood and Affect: Mood normal.        Behavior: Behavior normal.        Thought Content: Thought content normal.        Judgment: Judgment normal.            [1]  Current Outpatient Medications:    albuterol  (VENTOLIN  HFA) 108 (90 Base) MCG/ACT inhaler, Inhale 1-2 puffs into the lungs every 6 (six) hours as needed for wheezing or shortness of breath., Disp: 18 g, Rfl: 0   Albuterol -Budesonide (AIRSUPRA ) 90-80 MCG/ACT AERO, Inhale 2 puffs into the lungs every 6 (six) hours as needed., Disp: 10.7 g, Rfl: 0   aspirin  EC 81 MG tablet, Take 81 mg by mouth daily. Swallow whole., Disp: , Rfl:    azelastine  (ASTELIN ) 0.1 % nasal spray, Place 1 spray into both nostrils 2 (two) times daily. Use in each nostril as directed, Disp: 30 mL, Rfl: 12   Colostrum 500 MG CAPS, Take 500 mg by mouth daily., Disp: , Rfl:    fluticasone  (FLONASE ) 50 MCG/ACT nasal spray, SPRAY 2 SPRAYS INTO EACH NOSTRIL EVERY DAY, Disp: 48 mL, Rfl: 2   hydrochlorothiazide  (HYDRODIURIL ) 25 MG tablet, TAKE 1 TABLET (25 MG TOTAL) BY MOUTH DAILY. (Patient taking differently: Take 12.5 mg by mouth daily.), Disp: 30 tablet, Rfl: 5   Misc Natural Products (SU[employer] PO), Take 1 Scoop by mouth daily. , Disp: , Rfl:    promethazine -dextromethorphan (PROMETHAZINE -DM) 6.25-15 MG/5ML  syrup, Take 10 mLs by mouth 3 (three) times daily as needed for cough., Disp: 240 mL, Rfl: 0   telmisartan  (MICARDIS ) 80 MG tablet, TAKE 1 TABLET BY MOUTH EVERY DAY, Disp: 90 tablet, Rfl: 3   atorvastatin  (LIPITOR) 40 MG tablet, TAKE 1 TABLET BY MOUTH EVERY DAY (Patient not taking: Reported on 03/23/2024), Disp: 90 tablet, Rfl: 3   tadalafil (CIALIS) 5 MG tablet, Take 5 mg by mouth daily as needed., Disp: , Rfl:  [2]  Allergies Allergen Reactions   Ciprofloxacin Hcl     Numbness, joint pain   Cefdinir  Diarrhea    Fever, chills, stomach cramps    Tamsulosin Other (See Comments)    Lightheadedness, sinus issues   Lisinopril Cough   "

## 2024-03-26 ENCOUNTER — Encounter: Payer: Self-pay | Admitting: Family Medicine

## 2024-03-28 ENCOUNTER — Telehealth: Admitting: Nurse Practitioner

## 2024-03-28 DIAGNOSIS — J40 Bronchitis, not specified as acute or chronic: Secondary | ICD-10-CM | POA: Diagnosis not present

## 2024-03-28 MED ORDER — AZITHROMYCIN 250 MG PO TABS: ORAL_TABLET | ORAL | 0 refills | Status: AC

## 2024-03-28 MED ORDER — BENZONATATE 200 MG PO CAPS: 200.0000 mg | ORAL_CAPSULE | Freq: Two times a day (BID) | ORAL | 0 refills | Status: AC | PRN

## 2024-03-28 NOTE — Progress Notes (Signed)
 We are sorry that you are not feeling well.  Here is how we plan to help!  Based on your presentation I believe you most likely have A cough due to bacteria.  When patients have a fever and a productive cough with a change in color or increased sputum production, we are concerned about bacterial bronchitis.  If left untreated it can progress to pneumonia.  If your symptoms do not improve with your treatment plan it is important that you contact your provider.   I have prescribed Azithromyin 250 mg: two tablets now and then one tablet daily for 4 additonal days    In addition you may use A prescription cough medication called Tessalon  Perles 100mg . You may take 1-2 capsules every 8 hours as needed for your cough.   From your responses in the eVisit questionnaire you describe inflammation in the upper respiratory tract which is causing a significant cough.  This is commonly called Bronchitis and has four common causes:   Allergies Viral Infections Acid Reflux Bacterial Infection Allergies, viruses and acid reflux are treated by controlling symptoms or eliminating the cause. An example might be a cough caused by taking certain blood pressure medications. You stop the cough by changing the medication. Another example might be a cough caused by acid reflux. Controlling the reflux helps control the cough.  USE OF BRONCHODILATOR (RESCUE) INHALERS: There is a risk from using your bronchodilator too frequently.  The risk is that over-reliance on a medication which only relaxes the muscles surrounding the breathing tubes can reduce the effectiveness of medications prescribed to reduce swelling and congestion of the tubes themselves.  Although you feel brief relief from the bronchodilator inhaler, your asthma may actually be worsening with the tubes becoming more swollen and filled with mucus.  This can delay other crucial treatments, such as oral steroid medications. If you need to use a bronchodilator  inhaler daily, several times per day, you should discuss this with your provider.  There are probably better treatments that could be used to keep your asthma under control.     HOME CARE Only take medications as instructed by your medical team. Complete the entire course of an antibiotic. Drink plenty of fluids and get plenty of rest. Avoid close contacts especially the very young and the elderly Cover your mouth if you cough or cough into your sleeve. Always remember to wash your hands A steam or ultrasonic humidifier can help congestion.   GET HELP RIGHT AWAY IF: You develop worsening fever. You become short of breath You cough up blood. Your symptoms persist after you have completed your treatment plan MAKE SURE YOU  Understand these instructions. Will watch your condition. Will get help right away if you are not doing well or get worse.  Your e-visit answers were reviewed by a board certified advanced clinical practitioner to complete your personal care plan.  Depending on the condition, your plan could have included both over the counter or prescription medications. If there is a problem please reply  once you have received a response from your provider. Your safety is important to us .  If you have drug allergies check your prescription carefully.    You can use MyChart to ask questions about today's visit, request a non-urgent call back, or ask for a work or school excuse for 24 hours related to this e-Visit. If it has been greater than 24 hours you will need to follow up with your provider, or enter a new e-Visit  to address those concerns. You will get an e-mail in the next two days asking about your experience.  I hope that your e-visit has been valuable and will speed your recovery. Thank you for using e-visits.   I have spent 5 minutes in review of e-visit questionnaire, review and updating patient chart, medical decision making and response to patient.   Tashiana Lamarca W Nicco Reaume,  NP

## 2024-03-30 ENCOUNTER — Ambulatory Visit: Admitting: Cardiovascular Disease

## 2024-03-30 NOTE — Telephone Encounter (Signed)
 Patient seen virtually 1/24

## 2024-04-02 MED ORDER — AMOXICILLIN-POT CLAVULANATE 875-125 MG PO TABS: 1.0000 | ORAL_TABLET | Freq: Two times a day (BID) | ORAL | 0 refills | Status: AC

## 2024-04-03 NOTE — Progress Notes (Unsigned)
 "  Chief Complaint: Discuss colonoscopy  HPI:    Charles Tate is a 74 year old Caucasian male with a past medical history as listed below including reflux, known to Dr. Albertus, who presents to clinic today to discuss repeat colonoscopy.    05/30/2020 colonoscopy at [facility] done for personal history of piecemeal removal of a large sessile adenoma on last colonoscopy less than a year prior.  At that time findings of hemorrhoids, stool in the entire examined colon lavaged copiously with adequate visualization, post mucosectomy scar in the proximal ascending colon proximal to previously placed tattoo, four 3-6 mm polyps in transverse colon, hepatic flexure and ascending colon as well as diverticulosis.  Patient told to repeat in 3 years due to history of previously large advanced serrated adenoma.  Recommended this be with primary GI, Dr. Albertus.  Pathology showed colonic mucosa with benign lymphoid aggregates.  Still recommended to repeat colonoscopy in 3 years with Dr. Albertus.   Past Medical History:  Diagnosis Date   Allergy    Arthritis    left hip    Carotid artery occlusion    GERD (gastroesophageal reflux disease)    per patient, resolved. 03/15/20   Heart murmur    adolescent rheumatic fever - developed murmur    Hyperlipidemia    Hypertension    RAD (reactive airway disease)    with RTIs   Tubular adenoma     Past Surgical History:  Procedure Laterality Date   COLONOSCOPY     colonoscopy with polypectomy  2013   Dr Zulema, [place]   COLONOSCOPY WITH PROPOFOL  N/A 07/01/2019   Procedure: COLONOSCOPY WITH PROPOFOL ;  Surgeon: Wilhelmenia Aloha Raddle., MD;  Location: [facility];  Service: Gastroenterology;  Laterality: N/A;   COLONOSCOPY WITH PROPOFOL  N/A 05/30/2020   Procedure: COLONOSCOPY WITH PROPOFOL ;  Surgeon: Wilhelmenia Aloha Raddle., MD;  Location: [facility];  Service: Gastroenterology;  Laterality: N/A;   ENDARTERECTOMY Right 04/11/2020   Procedure: Right Carotid  Endarterectomy;  Surgeon: Harvey Carlin BRAVO, MD;  [facility];  Service: Vascular;  Laterality: Right;   ENDOSCOPIC MUCOSAL RESECTION N/A 07/01/2019   Procedure: ENDOSCOPIC MUCOSAL RESECTION;  Surgeon: Wilhelmenia Aloha Raddle., MD;  Location: [facility];  Service: Gastroenterology;  Laterality: N/A;   ENDOSCOPIC MUCOSAL RESECTION N/A 05/30/2020   Procedure: ENDOSCOPIC MUCOSAL RESECTION;  Surgeon: Wilhelmenia Aloha Raddle., MD;  Location: [facility];  Service: Gastroenterology;  Laterality: N/A;   HEMOSTASIS CLIP PLACEMENT  07/01/2019   Procedure: HEMOSTASIS CLIP PLACEMENT;  Surgeon: Wilhelmenia Aloha Raddle., MD;  Location: [facility];  Service: Gastroenterology;;   POLYPECTOMY     POLYPECTOMY  07/01/2019   Procedure: POLYPECTOMY;  Surgeon: Wilhelmenia Aloha Raddle., MD;  Location: [facility];  Service: Gastroenterology;;   POLYPECTOMY  05/30/2020   Procedure: POLYPECTOMY;  Surgeon: Wilhelmenia Aloha Raddle., MD;  Location: [facility];  Service: Gastroenterology;;   ROBLEY LIFTING INJECTION  07/01/2019   Procedure: SUBMUCOSAL LIFTING INJECTION;  Surgeon: Wilhelmenia Aloha Raddle., MD;  Location: [facility];  Service: Gastroenterology;;   SUBMUCOSAL TATTOO INJECTION  07/01/2019   Procedure: SUBMUCOSAL TATTOO INJECTION;  Surgeon: Wilhelmenia Aloha Raddle., MD;  Location: [facility];  Service: Gastroenterology;;   WISDOM TOOTH EXTRACTION      Current Outpatient Medications  Medication Sig Dispense Refill   albuterol  (VENTOLIN  HFA) 108 (90 Base) MCG/ACT inhaler Inhale 1-2 puffs into the lungs every 6 (six) hours as needed for wheezing or shortness of breath. 18 g 0   amoxicillin -clavulanate (AUGMENTIN ) 875-125 MG tablet Take 1 tablet  by mouth 2 (two) times daily for 7 days. 14 tablet 0   aspirin  EC 81 MG tablet Take 81 mg by mouth daily. Swallow whole.     atorvastatin  (LIPITOR) 40 MG tablet TAKE 1 TABLET BY MOUTH EVERY DAY (Patient not taking: Reported on 03/23/2024) 90 tablet 3   azelastine   (ASTELIN ) 0.1 % nasal spray Place 1 spray into both nostrils 2 (two) times daily. Use in each nostril as directed 30 mL 12   benzonatate  (TESSALON ) 200 MG capsule Take 1 capsule (200 mg total) by mouth 2 (two) times daily as needed for cough. 20 capsule 0   Colostrum 500 MG CAPS Take 500 mg by mouth daily.     fluticasone  (FLONASE ) 50 MCG/ACT nasal spray SPRAY 2 SPRAYS INTO EACH NOSTRIL EVERY DAY 48 mL 2   hydrochlorothiazide  (HYDRODIURIL ) 25 MG tablet TAKE 1 TABLET (25 MG TOTAL) BY MOUTH DAILY. (Patient taking differently: Take 12.5 mg by mouth daily.) 30 tablet 5   Misc Natural Products (SUPER GREENS PO) Take 1 Scoop by mouth daily.      promethazine -dextromethorphan (PROMETHAZINE -DM) 6.25-15 MG/5ML syrup Take 10 mLs by mouth 3 (three) times daily as needed for cough. 240 mL 0   tadalafil (CIALIS) 5 MG tablet Take 5 mg by mouth daily as needed.     telmisartan  (MICARDIS ) 80 MG tablet TAKE 1 TABLET BY MOUTH EVERY DAY 90 tablet 3   No current facility-administered medications for this visit.    Allergies as of 04/06/2024 - Review Complete 03/28/2024  Allergen Reaction Noted   Ciprofloxacin hcl  12/07/2016   Cefdinir  Diarrhea 11/15/2016   Tamsulosin Other (See Comments) 02/12/2022   Lisinopril Cough 05/22/2017    Family History  Problem Relation Age of Onset   Arthritis Mother    Hyperlipidemia Mother    Lupus Mother    Heart attack Father 41   Hypertension Sister    Heart attack Paternal Grandmother 54   Diabetes Paternal Aunt    Stroke Maternal Grandfather 40   Cancer Paternal Grandfather        ? stomach; in 71s   Colon cancer Paternal Grandfather    Diabetes Paternal Grandfather    Colon polyps Neg Hx    Esophageal cancer Neg Hx    Rectal cancer Neg Hx    Stomach cancer Neg Hx    Inflammatory bowel disease Neg Hx    Liver disease Neg Hx    Pancreatic cancer Neg Hx     Social History   Socioeconomic History   Marital status: Married    Spouse name: Avelina   Number  of children: 2   Years of education: Not on file   Highest education level: Some college, no degree  Occupational History   Occupation: self employed   Tobacco Use   Smoking status: Former   Smokeless tobacco: Never   Tobacco comments:    intermittent, short term smoker as a teen. Some second hand smoke in 94s & 30s  Vaping Use   Vaping status: Never Used  Substance and Sexual Activity   Alcohol use: Not Currently   Drug use: No   Sexual activity: Not on file  Other Topics Concern   Not on file  Social History Narrative   Exercise: none regularly      Lives with wife/2025   Social Drivers of Health   Tobacco Use: Medium Risk (03/23/2024)   Patient History    Smoking Tobacco Use: Former    Smokeless Tobacco Use: Never  Passive Exposure: Not on file  Financial Resource Strain: Low Risk (03/23/2024)   Overall Financial Resource Strain (CARDIA)    Difficulty of Paying Living Expenses: Not hard at all  Food Insecurity: No Food Insecurity (03/23/2024)   Epic    Worried About Programme Researcher, Broadcasting/film/video in the Last Year: Never true    Ran Out of Food in the Last Year: Never true  Transportation Needs: No Transportation Needs (03/23/2024)   Epic    Lack of Transportation (Medical): No    Lack of Transportation (Non-Medical): No  Physical Activity: Inactive (03/23/2024)   Exercise Vital Sign    Days of Exercise per Week: 0 days    Minutes of Exercise per Session: Not on file  Stress: No Stress Concern Present (03/23/2024)   [employer] of Occupational Health - Occupational Stress Questionnaire    Feeling of Stress: Not at all  Social Connections: Socially Integrated (03/23/2024)   Social Connection and Isolation Panel    Frequency of Communication with Friends and Family: More than three times a week    Frequency of Social Gatherings with Friends and Family: Once a week    Attends Religious Services: More than 4 times per year    Active Member of Clubs or Organizations: Yes     Attends Banker Meetings: More than 4 times per year    Marital Status: Married  Catering Manager Violence: Not At Risk (10/03/2023)   Epic    Fear of Current or Ex-Partner: No    Emotionally Abused: No    Physically Abused: No    Sexually Abused: No  Depression (PHQ2-9): Low Risk (03/23/2024)   Depression (PHQ2-9)    PHQ-2 Score: 0  Alcohol Screen: Low Risk (09/07/2021)   Alcohol Screen    Last Alcohol Screening Score (AUDIT): 0  Housing: Low Risk (03/23/2024)   Epic    Unable to Pay for Housing in the Last Year: No    Number of Times Moved in the Last Year: 0    Homeless in the Last Year: No  Utilities: Not At Risk (10/03/2023)   Epic    Threatened with loss of utilities: No  Health Literacy: Adequate Health Literacy (10/03/2023)   B1300 Health Literacy    Frequency of need for help with medical instructions: Never    Review of Systems:    Constitutional: No weight loss, fever, chills, weakness or fatigue HEENT: Eyes: No change in vision               Ears, Nose, Throat:  No change in hearing or congestion Skin: No rash or itching Cardiovascular: No chest pain, chest pressure or palpitations   Respiratory: No SOB or cough Gastrointestinal: See HPI and otherwise negative Genitourinary: No dysuria or change in urinary frequency Neurological: No headache, dizziness or syncope Musculoskeletal: No new muscle or joint pain Hematologic: No bleeding or bruising Psychiatric: No history of depression or anxiety    Physical Exam:  Vital signs: There were no vitals taken for this visit.  Constitutional:   Pleasant Caucasian male appears to be in NAD, Well developed, Well nourished, alert and cooperative Head:  Normocephalic and atraumatic. Eyes:   PEERL, EOMI. No icterus. Conjunctiva pink. Ears:  Normal auditory acuity. Neck:  Supple Throat: Oral cavity and pharynx without inflammation, swelling or lesion.  Respiratory: Respirations even and unlabored. Lungs clear to  auscultation bilaterally.   No wheezes, crackles, or rhonchi.  Cardiovascular: Normal S1, S2. No MRG. Regular rate and rhythm.  No peripheral edema, cyanosis or pallor.  Gastrointestinal:  Soft, nondistended, nontender. No rebound or guarding. Normal bowel sounds. No appreciable masses or hepatomegaly. Rectal:  Not performed.  Msk:  Symmetrical without gross deformities. Without edema, no deformity or joint abnormality.  Neurologic:  Alert and  oriented x4;  grossly normal neurologically.  Skin:   Dry and intact without significant lesions or rashes. Psychiatric: Oriented to person, place and time. Demonstrates good judgement and reason without abnormal affect or behaviors.  RELEVANT LABS AND IMAGING: CBC    Component Value Date/Time   WBC 4.6 10/18/2023 0905   RBC 5.04 10/18/2023 0905   HGB 15.2 10/18/2023 0905   HCT 44.7 10/18/2023 0905   PLT 187.0 10/18/2023 0905   MCV 88.7 10/18/2023 0905   MCH 30.4 05/19/2022 1435   MCHC 33.9 10/18/2023 0905   RDW 12.7 10/18/2023 0905   LYMPHSABS 0.5 (L) 10/18/2023 0905   MONOABS 0.5 10/18/2023 0905   EOSABS 0.1 10/18/2023 0905   BASOSABS 0.0 10/18/2023 0905    CMP     Component Value Date/Time   NA 137 10/18/2023 0905   K 3.6 10/18/2023 0905   CL 98 10/18/2023 0905   CO2 31 10/18/2023 0905   GLUCOSE 98 10/18/2023 0905   GLUCOSE 113 (H) 01/16/2006 1442   BUN 16 10/18/2023 0905   CREATININE 0.72 10/18/2023 0905   CALCIUM  9.2 10/18/2023 0905   PROT 7.2 10/18/2023 0905   ALBUMIN  4.4 10/18/2023 0905   AST 19 10/18/2023 0905   ALT 16 10/18/2023 0905   ALKPHOS 67 10/18/2023 0905   BILITOT 0.6 10/18/2023 0905   GFRNONAA >60 05/19/2022 1435   GFRAA 129 06/28/2006 1044    Assessment: 1. ***  Plan: 1. ***     Delon Failing, PA-C [facility] 04/03/2024, 10:13 AM  Cc: Geofm Glade PARAS, MD  "

## 2024-04-06 ENCOUNTER — Ambulatory Visit: Admitting: Physician Assistant

## 2024-04-07 ENCOUNTER — Ambulatory Visit: Admitting: Cardiovascular Disease

## 2024-04-07 ENCOUNTER — Encounter: Payer: Self-pay | Admitting: Cardiovascular Disease

## 2024-04-07 VITALS — BP 127/67 | HR 60 | Ht 75.0 in | Wt 187.6 lb

## 2024-04-07 DIAGNOSIS — R931 Abnormal findings on diagnostic imaging of heart and coronary circulation: Secondary | ICD-10-CM

## 2024-04-07 DIAGNOSIS — I10 Essential (primary) hypertension: Secondary | ICD-10-CM | POA: Diagnosis not present

## 2024-04-07 DIAGNOSIS — Z01818 Encounter for other preprocedural examination: Secondary | ICD-10-CM | POA: Diagnosis not present

## 2024-04-07 DIAGNOSIS — Z9889 Other specified postprocedural states: Secondary | ICD-10-CM

## 2024-04-07 DIAGNOSIS — E782 Mixed hyperlipidemia: Secondary | ICD-10-CM

## 2024-04-07 NOTE — Assessment & Plan Note (Signed)
 Status post right carotid enterectomy performed by Dr. Harvey electively 04/11/2020.  Carotid Dopplers are followed in their office which have shown widely patent endarterectomy site.

## 2024-04-07 NOTE — Assessment & Plan Note (Signed)
 History of hyperlipidemia on statin therapy in the past but possibly statin intolerant with lipid profile performed 10/18/2023 revealing total cholesterol 191, LDL 129 and HDL of 44.  LDL goal less than 70 given his elevated coronary calcium  score.  I am going to repeat a lipid liver profile today.  He may be a candidate for a PCSK9.

## 2024-04-07 NOTE — Assessment & Plan Note (Signed)
 History of elevated coronary calcium  score of 55 performed 08/28/2019.  He is active and completely asymptomatic.

## 2024-04-07 NOTE — Patient Instructions (Signed)
 Medication Instructions:  Your physician recommends that you continue on your current medications as directed. Please refer to the Current Medication list given to you today.  *If you need a refill on your cardiac medications before your next appointment, please call your pharmacy*  Lab Work: Today: Lipid/liver panel If you have labs (blood work) drawn today and your tests are completely normal, you will receive your results only by: MyChart Message (if you have MyChart) OR A paper copy in the mail If you have any lab test that is abnormal or we need to change your treatment, we will call you to review the results.   Follow-Up: At [facility], you and your health needs are our priority.  As part of our continuing mission to provide you with exceptional heart care, our providers are all part of one team.  This team includes your primary Cardiologist (physician) and Advanced Practice Providers or APPs (Physician Assistants and Nurse Practitioners) who all work together to provide you with the care you need, when you need it.  Your next appointment:   12 month(s)  Provider:   Dorn Lesches, MD    We recommend signing up for the patient portal called MyChart.  Sign up information is provided on this After Visit Summary.  MyChart is used to connect with patients for Virtual Visits (Telemedicine).  Patients are able to view lab/test results, encounter notes, upcoming appointments, etc.  Non-urgent messages can be sent to your provider as well.   To learn more about what you can do with MyChart, go to [email or web address].   Other Instructions

## 2024-04-08 ENCOUNTER — Ambulatory Visit: Payer: Self-pay | Admitting: Cardiovascular Disease

## 2024-04-08 ENCOUNTER — Ambulatory Visit: Admitting: Cardiovascular Disease

## 2024-04-08 DIAGNOSIS — R931 Abnormal findings on diagnostic imaging of heart and coronary circulation: Secondary | ICD-10-CM

## 2024-04-08 DIAGNOSIS — E782 Mixed hyperlipidemia: Secondary | ICD-10-CM

## 2024-04-08 LAB — HEPATIC FUNCTION PANEL
ALT: 13 [IU]/L (ref 0–44)
AST: 17 [IU]/L (ref 0–40)
Albumin: 4.2 g/dL (ref 3.8–4.8)
Alkaline Phosphatase: 78 [IU]/L (ref 47–123)
Bilirubin Total: 0.4 mg/dL (ref 0.0–1.2)
Bilirubin, Direct: 0.13 mg/dL (ref 0.00–0.40)
Total Protein: 6.7 g/dL (ref 6.0–8.5)

## 2024-04-08 LAB — LIPID PANEL
Chol/HDL Ratio: 4.8 ratio (ref 0.0–5.0)
Cholesterol, Total: 181 mg/dL (ref 100–199)
HDL: 38 mg/dL — ABNORMAL LOW
LDL Chol Calc (NIH): 123 mg/dL — ABNORMAL HIGH (ref 0–99)
Triglycerides: 109 mg/dL (ref 0–149)
VLDL Cholesterol Cal: 20 mg/dL (ref 5–40)
